# Patient Record
Sex: Male | Born: 1940
Health system: Southern US, Community
[De-identification: ages and names within clinical notes are randomized; demographics above are authoritative.]

## PROBLEM LIST (undated history)

## (undated) DIAGNOSIS — I4891 Unspecified atrial fibrillation: Secondary | ICD-10-CM

## (undated) DIAGNOSIS — C801 Malignant (primary) neoplasm, unspecified: Secondary | ICD-10-CM

## (undated) DIAGNOSIS — M51369 Other intervertebral disc degeneration, lumbar region without mention of lumbar back pain or lower extremity pain: Secondary | ICD-10-CM

## (undated) DIAGNOSIS — I10 Essential (primary) hypertension: Secondary | ICD-10-CM

## (undated) DIAGNOSIS — E119 Type 2 diabetes mellitus without complications: Secondary | ICD-10-CM

## (undated) DIAGNOSIS — I739 Peripheral vascular disease, unspecified: Secondary | ICD-10-CM

## (undated) DIAGNOSIS — D631 Anemia in chronic kidney disease: Secondary | ICD-10-CM

## (undated) DIAGNOSIS — I6529 Occlusion and stenosis of unspecified carotid artery: Secondary | ICD-10-CM

## (undated) DIAGNOSIS — I2510 Atherosclerotic heart disease of native coronary artery without angina pectoris: Secondary | ICD-10-CM

## (undated) DIAGNOSIS — E785 Hyperlipidemia, unspecified: Secondary | ICD-10-CM

## (undated) DIAGNOSIS — K900 Celiac disease: Secondary | ICD-10-CM

## (undated) DIAGNOSIS — D509 Iron deficiency anemia, unspecified: Secondary | ICD-10-CM

## (undated) DIAGNOSIS — Z8673 Personal history of transient ischemic attack (TIA), and cerebral infarction without residual deficits: Secondary | ICD-10-CM

## (undated) DIAGNOSIS — N183 Chronic kidney disease, stage 3 (moderate): Secondary | ICD-10-CM

## (undated) DIAGNOSIS — M5136 Other intervertebral disc degeneration, lumbar region: Secondary | ICD-10-CM

## (undated) DIAGNOSIS — E538 Deficiency of other specified B group vitamins: Principal | ICD-10-CM

## (undated) HISTORY — DX: Other intervertebral disc degeneration, lumbar region without mention of lumbar back pain or lower extremity pain: M51.369

## (undated) HISTORY — DX: Celiac disease: K90.0

## (undated) HISTORY — DX: Anemia in chronic kidney disease: D63.1

## (undated) HISTORY — PX: LUMBAR DISC SURGERY: SHX700

## (undated) HISTORY — PX: COLON SURGERY: SHX602

## (undated) HISTORY — DX: Unspecified atrial fibrillation: I48.91

## (undated) HISTORY — DX: Iron deficiency anemia, unspecified: D50.9

## (undated) HISTORY — DX: Personal history of transient ischemic attack (TIA), and cerebral infarction without residual deficits: Z86.73

## (undated) HISTORY — DX: Type 2 diabetes mellitus without complications: E11.9

## (undated) HISTORY — DX: Other intervertebral disc degeneration, lumbar region: M51.36

## (undated) HISTORY — DX: Atherosclerotic heart disease of native coronary artery without angina pectoris: I25.10

## (undated) HISTORY — DX: Peripheral vascular disease, unspecified: I73.9

## (undated) HISTORY — DX: Hyperlipidemia, unspecified: E78.5

## (undated) HISTORY — DX: Occlusion and stenosis of unspecified carotid artery: I65.29

## (undated) HISTORY — DX: Deficiency of other specified B group vitamins: E53.8

## (undated) HISTORY — DX: Chronic kidney disease, stage 3 (moderate): N18.3

---

## 1968-09-21 HISTORY — PX: APPENDECTOMY: SHX54

## 1999-05-23 ENCOUNTER — Inpatient Hospital Stay (HOSPITAL_COMMUNITY): Admission: RE | Admit: 1999-05-23 | Discharge: 1999-05-25 | Payer: Self-pay | Admitting: Neurosurgery

## 1999-05-23 ENCOUNTER — Encounter: Payer: Self-pay | Admitting: Neurosurgery

## 1999-05-24 ENCOUNTER — Encounter: Payer: Self-pay | Admitting: Neurosurgery

## 2000-05-07 ENCOUNTER — Encounter: Payer: Self-pay | Admitting: Neurosurgery

## 2000-05-07 ENCOUNTER — Encounter: Admission: RE | Admit: 2000-05-07 | Discharge: 2000-05-07 | Payer: Self-pay | Admitting: Neurosurgery

## 2000-05-27 ENCOUNTER — Encounter: Payer: Self-pay | Admitting: Neurology

## 2000-05-27 ENCOUNTER — Inpatient Hospital Stay (HOSPITAL_COMMUNITY): Admission: EM | Admit: 2000-05-27 | Discharge: 2000-06-02 | Payer: Self-pay | Admitting: Emergency Medicine

## 2000-05-27 ENCOUNTER — Encounter: Payer: Self-pay | Admitting: *Deleted

## 2000-05-27 ENCOUNTER — Encounter: Payer: Self-pay | Admitting: Emergency Medicine

## 2000-06-07 ENCOUNTER — Encounter: Admission: RE | Admit: 2000-06-07 | Discharge: 2000-06-21 | Payer: Self-pay | Admitting: Neurology

## 2000-06-14 ENCOUNTER — Encounter: Payer: Self-pay | Admitting: Vascular Surgery

## 2000-06-15 ENCOUNTER — Encounter (INDEPENDENT_AMBULATORY_CARE_PROVIDER_SITE_OTHER): Payer: Self-pay | Admitting: Specialist

## 2000-06-15 ENCOUNTER — Inpatient Hospital Stay: Admission: RE | Admit: 2000-06-15 | Discharge: 2000-06-16 | Payer: Self-pay | Admitting: Vascular Surgery

## 2000-06-15 HISTORY — PX: CAROTID ENDARTERECTOMY: SUR193

## 2000-07-16 ENCOUNTER — Encounter: Payer: Self-pay | Admitting: Neurosurgery

## 2000-07-16 ENCOUNTER — Ambulatory Visit (HOSPITAL_COMMUNITY): Admission: RE | Admit: 2000-07-16 | Discharge: 2000-07-17 | Payer: Self-pay | Admitting: Neurosurgery

## 2006-09-21 HISTORY — PX: CAROTID ENDARTERECTOMY: SUR193

## 2006-10-12 ENCOUNTER — Ambulatory Visit (HOSPITAL_COMMUNITY): Admission: RE | Admit: 2006-10-12 | Discharge: 2006-10-12 | Payer: Self-pay | Admitting: Ophthalmology

## 2006-11-18 ENCOUNTER — Ambulatory Visit (HOSPITAL_COMMUNITY): Admission: RE | Admit: 2006-11-18 | Discharge: 2006-11-18 | Payer: Self-pay | Admitting: Urology

## 2006-11-23 ENCOUNTER — Ambulatory Visit (HOSPITAL_COMMUNITY): Admission: RE | Admit: 2006-11-23 | Discharge: 2006-11-23 | Payer: Self-pay | Admitting: Urology

## 2006-12-01 ENCOUNTER — Ambulatory Visit (HOSPITAL_COMMUNITY): Admission: RE | Admit: 2006-12-01 | Discharge: 2006-12-01 | Payer: Self-pay | Admitting: Urology

## 2006-12-07 ENCOUNTER — Ambulatory Visit (HOSPITAL_COMMUNITY): Admission: RE | Admit: 2006-12-07 | Discharge: 2006-12-07 | Payer: Self-pay | Admitting: Urology

## 2007-01-07 ENCOUNTER — Ambulatory Visit (HOSPITAL_COMMUNITY): Admission: RE | Admit: 2007-01-07 | Discharge: 2007-01-07 | Payer: Self-pay | Admitting: Urology

## 2007-03-15 ENCOUNTER — Ambulatory Visit (HOSPITAL_COMMUNITY): Admission: RE | Admit: 2007-03-15 | Discharge: 2007-03-15 | Payer: Self-pay | Admitting: Urology

## 2007-07-12 ENCOUNTER — Ambulatory Visit: Payer: Self-pay | Admitting: Vascular Surgery

## 2007-07-15 ENCOUNTER — Ambulatory Visit: Payer: Self-pay | Admitting: Cardiology

## 2007-07-20 ENCOUNTER — Ambulatory Visit: Payer: Self-pay | Admitting: Vascular Surgery

## 2007-07-20 ENCOUNTER — Encounter: Payer: Self-pay | Admitting: Vascular Surgery

## 2007-07-20 ENCOUNTER — Inpatient Hospital Stay (HOSPITAL_COMMUNITY): Admission: RE | Admit: 2007-07-20 | Discharge: 2007-07-21 | Payer: Self-pay | Admitting: Vascular Surgery

## 2007-08-02 ENCOUNTER — Ambulatory Visit: Payer: Self-pay | Admitting: Vascular Surgery

## 2007-11-29 ENCOUNTER — Encounter: Payer: Self-pay | Admitting: Internal Medicine

## 2008-01-24 ENCOUNTER — Ambulatory Visit: Payer: Self-pay | Admitting: Vascular Surgery

## 2008-07-05 ENCOUNTER — Inpatient Hospital Stay (HOSPITAL_COMMUNITY): Admission: EM | Admit: 2008-07-05 | Discharge: 2008-07-09 | Payer: Self-pay | Admitting: Emergency Medicine

## 2008-07-05 ENCOUNTER — Ambulatory Visit: Payer: Self-pay | Admitting: Family Medicine

## 2008-07-05 ENCOUNTER — Ambulatory Visit: Payer: Self-pay | Admitting: Internal Medicine

## 2008-07-05 ENCOUNTER — Encounter: Payer: Self-pay | Admitting: Family Medicine

## 2008-07-06 ENCOUNTER — Encounter: Payer: Self-pay | Admitting: Family Medicine

## 2008-07-09 ENCOUNTER — Encounter: Payer: Self-pay | Admitting: Family Medicine

## 2008-07-09 ENCOUNTER — Ambulatory Visit: Payer: Self-pay | Admitting: Vascular Surgery

## 2008-08-13 ENCOUNTER — Ambulatory Visit: Payer: Self-pay | Admitting: Vascular Surgery

## 2009-02-12 ENCOUNTER — Ambulatory Visit: Payer: Self-pay | Admitting: Vascular Surgery

## 2009-06-17 ENCOUNTER — Ambulatory Visit (HOSPITAL_COMMUNITY): Admission: RE | Admit: 2009-06-17 | Discharge: 2009-06-17 | Payer: Self-pay | Admitting: Internal Medicine

## 2010-02-18 ENCOUNTER — Ambulatory Visit: Payer: Self-pay | Admitting: Vascular Surgery

## 2010-07-01 ENCOUNTER — Ambulatory Visit: Payer: Self-pay | Admitting: Vascular Surgery

## 2011-02-03 NOTE — H&P (Signed)
HISTORY AND PHYSICAL EXAMINATION   July 12, 2007   Re:  MADDOXX, BURKITT                DOB:  31-Jan-1941   CHIEF COMPLAINT:  Severe left internal carotid stenosis, asymptomatic.   HISTORY OF PRESENT ILLNESS:  This 70 year old male patient has a history  of vascular occlusive disease, having previously undergone a right  carotid endarterectomy by Dr. Hart Rochester in September, 2001 following a  right brain CVA, which consisted of some left facial and upper extremity  weakness and clumsiness, all of which eventually resolved.  He continues  to be asymptomatic at this point but has had moderate left carotid  occlusive disease, which has been followed on an annual basis and has  now progressed to an 80-85% stenosis in his left internal carotid artery  with no significant flow reduction in the right internal carotid at the  site of the previous endarterectomy.   He denies any hemiparesis, aphasia, amaurosis fugax, diplopia, blurred  vision or syncope.  He is scheduled for an elective carotid  endarterectomy.   PAST MEDICAL HISTORY:  1. Insulin-dependent diabetes mellitus.  2. Hyperlipidemia.  3. Right brain CVA in 2001.  4. Coronary artery disease, currently asymptomatic.  Previous PTCA and      stenting 15 years ago.  5. Negative for COPD or congestive heart failure.   PAST SURGICAL HISTORY:  1. Right carotid endarterectomy.  2. Appendectomy.  3. Lumbar disk surgery.   FAMILY HISTORY:  Positive for coronary artery disease in his mother and  brothers.  Positive for diabetes in his sister but negative for strokes.   SOCIAL HISTORY:  He is widowed, has two children.  Works as a Soil scientist.  He does not use tobacco and has not since 1965.  He does not use  alcohol.   REVIEW OF SYSTEMS:  He denies any weight loss, anorexia, fever, chest  pain but does have some mild dyspnea on exertion.  He has no orthopnea  or palpitations.  He denies any bronchitis, hemoptysis,  asthma,  wheezing, hematemesis, melena, hiatal hernia symptoms.  He has some  discomfort in both calves with walking about one block but no rest pain  or nonhealing ulcers.   ALLERGIES:  None known.   MEDICATIONS:  1. Lantus insulin 80 units daily.  2. Lipitor 20 mg 1 daily.  3. Tricor 145 mg 1 daily.  4. Altace 2.5 mg 1 daily.  5. Atenolol 50 mg 1 daily.  6. Glipizide/Metformin 5/500 2 in the morning, 1 at bedtime.  7. Cilasozol 1 daily.  8. Bayer aspirin 325 mg 1 daily.  9. A sleeping tablet at night.   PHYSICAL EXAMINATION:  Blood pressure 144/70, heart rate 80,  respirations 18.  Generally, he is a healthy-appearing male in no  apparent distress.  He is alert and oriented x3.  Neck is supple with 3+  carotid pulses palpable.  There is a harsh bruit on the left side.  Neurologic exam is normal.  No palpable adenopathy in the neck.  Upper  extremities, pulses are 3+ bilaterally.  Chest:  Clear to auscultation.  Cardiovascular: Regular rhythm with no murmurs.  Abdomen is soft and  nontender with no masses.  He has 3+ femoral and 2+ popliteal pulses  bilaterally with no distal pulses palpable.  Both feet are well  perfused.   Carotid duplex exam performed on October 21 reveals a 80-90% left  internal carotid stenosis and an approximate 30%  right internal carotid  stenosis.   IMPRESSION:  1. Severe left internal carotid stenosis, asymptomatic.  2. Coronary artery disease, currently asymptomatic, status post      percutaneous transluminal coronary angioplasty and stenting in the      past.  3. Type I diabetes mellitus.  4. Hyperlipidemia.  5. History of right brain stroke.   He will obtain a Cardiolite preoperatively and if negative, we will  proceed with an elective left carotid endarterectomy  on October 29th.  Risks and benefits have been thoroughly discussed in the office, and he  would like to proceed.   Quita Skye Hart Rochester, M.D.  Electronically Signed   JDL/MEDQ  D:   07/12/2007  T:  07/13/2007  Job:  [id number]   cc:   Dr. Corrinne Eagle. Ouida Sills, MD

## 2011-02-03 NOTE — Discharge Summary (Signed)
NAME:  FELIX, MERAS NO.:  [id number]   MEDICAL RECORD NO.:  [id number]          PATIENT TYPE:  INP   LOCATION:  3302                         FACILITY:  [facility]   PHYSICIAN:  Quita Skye. Hart Rochester, M.D.  DATE OF BIRTH:  Aug 16, 1941   DATE OF ADMISSION:  07/20/2007  DATE OF DISCHARGE:  07/21/2007                               DISCHARGE SUMMARY   ADMISSION DIAGNOSES:  1. Severe left internal carotid stenosis - asymptomatic.  2. Insulin-dependent diabetes mellitus.  3. Hyperlipidemia.  4. History of right brain cerebral vascular accident in 2001.  5. Coronary artery disease.   OPERATIVE PROCEDURES:  Performed during the hospitalization on October  29, right left carotid endarterectomy with Dacron patch angioplasty.   SURGEON:  Dr. Hart Rochester.   FIRST ASSISTANT:  Dr. Liliane Bade.   HISTORY OF PRESENT ILLNESS:  This 70 year old male had a history of  vascular disease and had previously undergone right carotid  endarterectomy by Dr. Hart Rochester in September 2001 after right brain stroke  which consisted of some left facial and upper extremity weakness and  clumsiness, all of which eventually resolved.  He has been followed for  moderate left internal carotid stenosis which has now progressed to  about 85% in severity and scheduled for a left carotid endarterectomy.  He denies any hemispheric or nonhemispheric symptoms at this time.   PAST MEDICAL HISTORY:  1. Type 1 diabetes mellitus.  2. Hyperlipidemia.  3. Right brain stroke in 2001.  4. Coronary artery disease.  5. Negative for COPD and congestive heart failure.   ADMISSION LABORATORY DATA:  Includes hematocrit of 33%, white count  6700.  Normal clotting studies.  Potassium 4.5, creatinine of 1.3.  Normal liver function studies.  Chest x-ray with no acute findings and  electrocardiogram unremarkable.   HOSPITAL COURSE:  The patient was admitted on October 29, taken to the  operating room for an elective left carotid  endarterectomy by Dr. Hart Rochester  which was uneventful.  The patient woke promptly in the operating room  following anesthesia with a normal neurologic exam.  Blood pressure  remained stable in the 130 to 160 range systolic.  He had excellent  urinary output and was able to swallow without difficulty, had no  hoarseness, and no neurologic deficits or complications.  He was  maintained in 3300 overnight, remained stable, and the following  morning, his lines were all discontinued.  He ambulated without  difficulty, tolerated his diet, voided and was discharged home.   DISCHARGE INSTRUCTIONS:  1. Cleanse the wound with soap and water.  2. No driving for 2 weeks.  3. Gradually resume normal activity and return to the office to see      Dr. Hart Rochester in 2 weeks.   DISCHARGE MEDICATIONS:  Include:  1. Tylox 1 p.o. q.6h. p.r.n. for pain  2. Metformin 1000 mg 2 times daily.  3. Pravastatin 40 mg 1 daily.  4. Lisinopril 20 mg 1 daily.  5. Atenolol 50 mg 1 daily.  6. Gemfibrozil 600 mg 2 times daily.  7. Aspirin 81 mg 1 daily.  8. Lantus  insulin 100 units subcutaneous daily and NovoLog plain 10      units subcutaneous q.a.m. and p.m.   CONDITION AT DISCHARGE:  Is improved.   FINAL DIAGNOSES:  1. Severe left internal carotid stenosis - asymptomatic.  2. Insulin-dependent diabetes mellitus.  3. Hyperlipidemia.  4. History of right brain cerebral vascular accident in 2001.  5. Coronary artery disease.   The patient will return to see Dr. Hart Rochester in the office in 2 weeks.      Quita Skye Hart Rochester, M.D.  Electronically Signed     JDL/MEDQ  D:  07/20/2007  T:  07/21/2007  Job:  [id number]

## 2011-02-03 NOTE — Procedures (Signed)
CAROTID DUPLEX EXAM   INDICATION:  Carotid artery disease.   HISTORY:  Diabetes:  Yes.  Cardiac:  PTCA.  Hypertension:  No.  Smoking:  No.  Previous Surgery:  Right carotid endarterectomy on 06/15/2000, left  carotid endarterectomy on 07/20/2007.  CV History:  Currently asymptomatic.  Amaurosis Fugax No, Paresthesias No, Hemiparesis No                                       RIGHT             LEFT  Brachial systolic pressure:         132               138  Brachial Doppler waveforms:         Normal            Normal  Vertebral direction of flow:        Antegrade         Antegrade  DUPLEX VELOCITIES (cm/sec)  CCA peak systolic                   86                115  ECA peak systolic                   349               141  ICA peak systolic                   108               78  ICA end diastolic                   26                23  PLAQUE MORPHOLOGY:                  Mixed  PLAQUE AMOUNT:                      Mild/moderate     None  PLAQUE LOCATION:                    ICA/ECA   IMPRESSION:  1. Patent right carotid endarterectomy site with a 1-39% stenosis of      the right internal carotid artery.  2. Patent left carotid endarterectomy site with no evidence of      stenosis in the left internal carotid artery.  3. No significant change noted when compared to the previous exam on      01/24/2008.   ___________________________________________  Quita Skye. Hart Rochester, M.D.   CH/MEDQ  D:  02/13/2009  T:  02/13/2009  Job:  [id number]

## 2011-02-03 NOTE — Procedures (Signed)
CAROTID DUPLEX EXAM   INDICATION:  Followup of known carotid artery disease.  Patient is  asymptomatic.   HISTORY:  Diabetes:  Yes.  Cardiac:  PTCA 11 years ago.  Hypertension:  No.  Smoking:  No.  Previous Surgery:  Right CEA with DPA on 06/15/2000 and left CEA with  DPA on 07/20/07, both by Dr. Hart [name].  CV History:  Amaurosis Fugax No, Paresthesias No, Hemiparesis No                                       RIGHT             LEFT  Brachial systolic pressure:         180               175  Brachial Doppler waveforms:         WNL               WNL  Vertebral direction of flow:        Antegrade         Antegrade  DUPLEX VELOCITIES (cm/sec)  CCA peak systolic                   69                74  ECA peak systolic                   248               60  ICA peak systolic                   111               76 (M)  ICA end diastolic                   23                29  PLAQUE MORPHOLOGY:                  Mixed             N/A  PLAQUE AMOUNT:                      Mild              N/A  PLAQUE LOCATION:                    Bifurcation, ICA, ECA               N/A   IMPRESSION:  1. Right 20-39% internal carotid artery stenosis (upper end of range),      status post carotid endarterectomy.  2. Left internal carotid artery without recurrent stenosis, status      post carotid endarterectomy.  3. Right external carotid artery stenosis.  4. Bilateral vertebral arteries with antegrade flow.   ___________________________________________  Stephen [name], M.D.   PB/MEDQ  D:  01/24/2008  T:  01/24/2008  Job:  [id number]

## 2011-02-03 NOTE — Procedures (Signed)
LOWER EXTREMITY ARTERIAL DUPLEX   INDICATION:  Bilateral claudication.   HISTORY:  Diabetes:  Yes.  Cardiac:  No.  Hypertension:  Yes.  Smoking:  No.  Previous Surgery:  No.   SINGLE LEVEL ARTERIAL EXAM                          RIGHT                LEFT  Brachial:               142                  151  Anterior tibial:        115                  107  Posterior tibial:       143                  104  Peroneal:  Ankle/Brachial Index:   0.95                 0.71   LOWER EXTREMITY ARTERIAL DUPLEX EXAM   DUPLEX:  Left superficial femoral artery distal stenosis at 291 m/s.  Mild-to-moderate plaquing noted throughout the left lower extremity  throughout the superficial femoral artery.  Luminal reduction of the  left superficial femoral artery distally, 16 cm.  Moderate  atherosclerosis throughout the left popliteal artery with monophasic  waveforms.   IMPRESSION:  Left distal superficial femoral artery stenosis at 291 m/s.  Monophasic waveforms throughout the left superficial femoral artery mid  and distally.  Moderate plaquing throughout the left lower extremity.  Difficulty visualizing the left profunda femoral artery proximally.   ___________________________________________  Quita Skye Hart Rochester, M.D.   OD/MEDQ  D:  07/01/2010  T:  07/01/2010  Job:  [id number]

## 2011-02-03 NOTE — Assessment & Plan Note (Signed)
OFFICE VISIT   LEDON, WEIHE  DOB:  Sep 20, 1941                                       01/24/2008  [id number]#:[id number]   The patient underwent left carotid endarterectomy for an asymptomatic  severe stenosis by me on 07/20/2007 and has done quite well.  He denies  any neurologic symptoms such as hemiparesis, aphasia, amaurosis fugax,  diplopia, blurred vision or syncope.  He has had no difficulty  swallowing and has had no hoarseness.  He denies any chest pain, dyspnea  on exertion, PND or orthopnea.  He does have stable bilateral calf  claudication at one block which is not limiting him severely with no  rest pain or history of infection or nonhealing ulcers.  He takes one  aspirin per day.   PHYSICAL EXAMINATION:  Vital signs:  On physical examination blood  pressure is 152/80, heart rate is 84, respirations 12.  General:  He is  alert and oriented x3.  Neck:  Carotid pulses are 3+ bilaterally.  No  bruits are audible.  Neurological:  Normal exam.  Chest:  Clear to  auscultation.  Cardiovascular:  Reveals a regular rhythm with no  murmurs.  Abdomen:  Soft, nontender with no masses.  He has 3+ femoral  and 2+ popliteal pulses bilaterally.   Carotid duplex exam reveals no evidence of flow reduction in the left  side where recent surgery was performed and his right carotid  endarterectomy site from 2001 is also widely patent.  He was reassured  regarding these findings.  We will see him in 6 months for the carotid  protocol followup unless he develops any symptoms in the interim.   Quita Skye Hart Rochester, M.D.  Electronically Signed   JDL/MEDQ  D:  01/24/2008  T:  01/25/2008  Job:  [id number]   cc:   Kingsley Callander. Ouida Sills, MD

## 2011-02-03 NOTE — Letter (Signed)
July 15, 2007    Stephen [name]. Hart Rochester, M.D.  [address]  [place], [place] [address]   RE:  NAVRAJ, DREIBELBIS  MRN:  [id number]  /  DOB:  12-10-1940   Dear JD:   Mr. Kainz was seen in the office today in consultation at your request  for coronary disease.  As you know, this nice gentleman underwent  balloon angioplasty of a stenosis of the left anterior descending  coronary artery in 1994.  He was noted to have total obstruction of the  RC at that time that was not approached percutaneously.  He is said to  have had a stress test that showed inferior wall ischemia consistent  with his known disease.  Nonetheless, he now indicates that he continues  to be asymptomatic with no chest discomfort and no dyspnea.   Mr. Fesperman also has cerebrovascular disease, having undergone right  carotid endarterectomy after a right cerebral CVA in 2001.  He now has  progressive and critical disease in the left, prompting plans for  elective repair.  He has multiple additional vascular risk factors  including diabetes, hypertension, and dyslipidemia.  He has only a  minimal and remote consumption of cigarettes.  He describes  claudication, Doppler studies of lower extremities are planned.   PAST MEDICAL HISTORY:  Otherwise notable for a remote appendectomy and a  herniated nucleus pulposis at L5 that required surgery on 2 occasions in  2000 and 2001.   SOCIAL HISTORY:  No excessive use of alcohol; widower with 2 children.  Previously worked as a Soil scientist.   FAMILY HISTORY:  Mother had coronary disease as well as a number of  brothers.  Sister has diabetes.  No family members with cerebrovascular  disease.   REVIEW OF SYSTEMS:  Entirely negative except as noted above.   CURRENT MEDICATIONS:  1. Metformin 1000 mg b.i.d.  2. Pravastatin 40 mg daily.  3. Lisinopril 20 mg daily.  4. Atenolol 50 mg daily.  5. Gemfibrozil 600 mg b.i.d.  6. Aspirin 81 mg daily.  7. Lantus insulin 100 units daily.  8.  NovoLog insulin 70/30 ten units b.i.d.   Recent laboratory values were sought.  We could locate only a hemoglobin  A1c level.   EXAMINATION:  Pleasant gentleman in no acute distress.  The weight is  225, blood pressure 140/80, heart rate 75 and regular, respirations 16.  HEENT:  EOMs full; pupils equal, round, reactive to light; normal oral  mucosa.  NECK:  Right carotid endarterectomy scar; faint bilateral bruits; no  jugular venous distention.  ENDOCRINE:  No thyromegaly.  HEMATOPOIETIC:  No adenopathy.  SKIN:  No significant lesions.  CHEST:  Normal AP diameter, clear lung fields.  CARDIAC:  Normal first and second heart sounds, modest early systolic  ejection murmur.  Normal PMI.  ABDOMEN:  Soft and nontender; no organomegaly; normal bowel sounds  without bruits; aortic pulsation not palpable.  EXTREMITIES:  With 1+ distal pulses; no edema.  NEUROMUSCULAR:  Symmetric strength and tone; normal cranial nerves.   EKG:  Normal sinus rhythm; within normal limits.   IMPRESSION:  Mr. Mondo has done outstandingly well with respect to  coronary disease, now 14 years following percutaneous intervention for  known 2 vessel disease.  There appears to be little reason to proceed  with preoperative stress testing.  He likely continues to have ischemia  in the distribution of the right coronary artery.  We could look for  disease in another distribution, but the  benefit of doing so is  speculative.  He is already treated with beta blocker and aspirin which  will continued through the operative interval.   Control of cardiac risk factors appears optimal or near optimal.  A  lipid profile will  be added to his preoperative lab studies.  Our  cardiology group will be available for assistance if needed while he is  hospital.  I will plan to see this nice gentleman again in 1 year.    Sincerely,      Gerrit Friends. Dietrich Pates, MD, [facility]  Electronically Signed    RMR/MedQ  DD: 07/15/2007   DT: 07/16/2007  Job #: [id number]   CC:    Kingsley Callander. Ouida Sills, MD

## 2011-02-03 NOTE — Discharge Summary (Signed)
NAME:  Stephen [name], Stephen [name] NO.:  [id number]   MEDICAL RECORD NO.:  [id number]          PATIENT TYPE:  INP   LOCATION:  3015                         FACILITY:  [facility]   PHYSICIAN:  Paula Compton, MD        DATE OF BIRTH:  06-Aug-1941   DATE OF ADMISSION:  07/05/2008  DATE OF DISCHARGE:  07/09/2008                               DISCHARGE SUMMARY   REASON FOR ADMISSION:  Slurred speech/dysphasia.   DISCHARGE DIAGNOSES:  1. Left thalamic cerebrovascular accident.  2. Diabetes mellitus type 2.  3. Hypertension.  4. Hyperlipidemia.   DISCHARGE MEDICATIONS:  1. Lantus 15 units subcu at bedtime.  2. NovoLog 10 units subcu t.i.d. with meals.  3. Metformin 1000 mg b.i.d.  4. TriCor 600 mg b.i.d.  5. Aspirin 81 mg p.o. daily.  6. Plavix 75 mg p.o. daily.  7. Atenolol 50 mg p.o. daily.  8. Pravastatin 40 mg p.o. daily.  9. Lisinopril 20 mg p.o. daily.   CONSULTS:  1. Neurology Stroke Team.  2. Cardiology,  [facility].   PROCEDURES:  1. July 05, 2008, two-view chest x-ray showed low volume exam with      atelectasis.  2. July 05, 2008, CT head without contrast showed old posterior      right frontal encephalomalacia and left thalamic and basal ganglia      lacunar infarcts. Negative for bleed.  3. July 05, 2008, MRI of the brain without contrast showed acute      infarct left thalamus extending towards the posterior limb of the      left internal capsule suggestion of tiny acute infarct posterior      right basal ganglia, posterior limb, right internal      capsule/thalamic junction.  4. July 06, 2008, 2-D echo.   IMPRESSION:  1. Significant nodular calcification of aortic valve some of which      appears to be mobile which may be source of the patient's embolus      cannot rule out small fibroelastoma consider transesophageal      echocardiography to further evaluate.  2. Carotid duplex study July 09, 2008 showed 40-59% stenosis right  proximal internal carotid artery with severe external carotid      artery stenosis noted.  For tibial artery flow antegrade      bilaterally.  No significant left internal carotid artery stenosis      noted.  3. July 09, 2008 transesophageal echocardiogram.  Impression;      aortic valve sclerosis but no fibroelastoma or papilloma, no      significant regurgitation or stenosis.  Normal mitral valve.  Mild      left ventricular hypertrophy with ejection fraction 65%.  Negative      bubble study.  No atrial septal defect or ventricular septal      defect.  No left atrial appendage clot.  No significant aortic      debris.  4. EKG July 05, 2008 regular rate and rhythm, mild ST depression 2,      3 and arteriovenous fistula without other concordant changes.  LABORATORY DATA:  Cardiac enzymes negative x3.  Cholesterol panel as  follows; total cholesterol 174, triglycerides 441, HDL 30, electrolytes  within normal limits except for glucose of 312.  On admission, white  blood cells 5.8, hemoglobin 12.1, hematocrit 35.2 on admission.  On  discharge, sodium 133, potassium 4.7, chloride 98, CO2 25, glucose 207,  BUN 36, creatinine 1.59.  White blood cells 7.9, hemoglobin 11.9,  hematocrit 33.9.  UA was notable for glucose of more than 500, protein  of 30 and small leukocyte esterase on admission.   HOSPITAL COURSE:  This is a 70 year old male with history of  hypertension, hyperlipidemia, type 2 diabetes who was admitted for  slurred speech and difficulty swallowing with elevated pressures to  232/119.   PROBLEMS:  1. Left thalamic CVA.  The patient initially presented with dysarthria      and difficulty swallowing was found to have continued dysarthria      per speech pathology during his hospital course.  The patient was      at baseline with regards to mobility. There was concern for      possible nidus for embolism with nodular mobile calcification of      aortic valve.  Cardiology  was consulted and TEE was performed with      results as above.  The patient was started on Plavix during      hospital course.  The patient with carotid Dopplers as above.  The      patient had also presented in hypertensive urgency.  Given this as      well as the patient's presenting symptoms, there was concern for      intracranial bleed.  CT was performed and there was no bleed noted      with results as above.  The patient was continued on his home doses      of lisinopril and atenolol during the course of his hospitalization      with better control of his pressures.  The patient's dysarthria and      dysphasia gradually improved over the course of the patient's      hospitalization.  The patient was also started on treatment dose      Lovenox during the course of his hospitalization and was discharged      on aspirin and Plavix.  2. Hypertension.  The patient initially presented with hypertensive      urgency with concern for emergency given his presentation of      slurring speech and dysarthria and dysphasia.  The patient was      continued on his home doses of lisinopril and atenolol.  Was put on      labetalol p.r.n.  Cardiac enzymes were within normal limits.  EKG      was without ST elevations or T-wave changes.  The patient's blood      pressure was better controlled over the course of his      hospitalization with systolics of 130-155 on discharge.  The      patient was discharged home on atenolol and lisinopril.  3. Hyperlipidemia.  The patient was started on TriCor during the      course of this hospitalization.  The patient was continued on his      home dose of pravastatin.  Fasting lipid panel was as above.  4. Diabetes mellitus type 2.  The patient was continued on NovoLog 10      units no coverage and Lantus  25 units at bedtime with fair control      of his blood glucose during the course of his hospitalization.  The      patient was started on 50 units Lantus at  bedtime on discharge with      instructions to follow up with his primary care physician regarding      this.  The patient had initially presented on Lantus 100 units at      bedtime.  The patient's metformin was held during the course of his      hospitalization; however, the patient was restarted on this on      discharge.  This medication was held for renal protection with      contrast studies.  5. Electrolyte abnormalities.  The patient had some mild electrolyte      abnormalities with sodium to a low of 132 and potassium to a high      of 4.7, BUN 236, and chloride to low of 98.  There was some concern      that this may have been related to his left thalamic CVA and the      patient's electrolytes were monitored during the course of his      hospitalization; however, there were no major deviations from the      norm prior to discharge.  This issue should also be followed up      with the patient's primary care physician.   FOLLOW UP:  The patient will follow up with the patient's primary care  physician, Dr. Carylon Perches in [place], phone number [phone] on July 12, 2008 at 4 p.m.Bobby Rumpf, MD  Electronically Signed      Paula Compton, MD  Electronically Signed    KC/MEDQ  D:  08/05/2008  T:  08/05/2008  Job:  [id number]   cc:   Kingsley Callander. Ouida Sills, MD

## 2011-02-03 NOTE — Procedures (Signed)
CAROTID DUPLEX EXAM   INDICATION:  Followup carotid artery disease.   HISTORY:  Diabetes:  Yes, on insulin  Cardiac:  PTCA 11 years ago  Hypertension:  No  Smoking:  No  Previous Surgery:  Right carotid endarterectomy with DPA on June 15, 2000, by Dr. Hart Rochester  CV History:  Amaurosis Fugax No.  Paresthesias No.  Hemiparesis No.                                       RIGHT             LEFT  Brachial systolic pressure:         130               134  Brachial Doppler waveforms:         Biphasic          Biphasic  Vertebral direction of flow:        Antegrade         Antegrade  DUPLEX VELOCITIES (cm/sec)  CCA peak systolic                   77                110  ECA peak systolic                   253               105  ICA peak systolic                   120               314  ICA end diastolic                   25                97  PLAQUE MORPHOLOGY:                  Mixed             Calcified  PLAQUE AMOUNT:                      Moderate          Large  PLAQUE LOCATION:                    ECA, ICA          ICA, ECA   IMPRESSION:  Bilateral ECA stenoses right greater than left.  20-39% right ICA stenosis status post endarterectomy.  60-79% left ICA stenosis (velocities have increased slightly from  previous exam).   ___________________________________________  Quita Skye Hart Rochester, M.D.   DP/MEDQ  D:  07/12/2007  T:  07/13/2007  Job:  [id number]

## 2011-02-03 NOTE — Procedures (Signed)
CAROTID DUPLEX EXAM   INDICATION:  Carotid artery disease.   HISTORY:  Diabetes:  Yes  Cardiac:  PTCA  Hypertension:  Yes  Smoking:  No  Previous Surgery:  Right carotid endarterectomy on 06/15/2000, left  carotid endarterectomy on 07/20/2007  CV History:  Asymptomatic  Amaurosis Fugax No, Paresthesias No, Hemiparesis No                                       RIGHT             LEFT  Brachial systolic pressure:         120               124  Brachial Doppler waveforms:         Triphasic         Triphasic  Vertebral direction of flow:        Ante              Triphasic Ante  DUPLEX VELOCITIES (cm/sec)  CCA peak systolic                   64                101  ECA peak systolic                   112               60  ICA peak systolic                   92                93  ICA end diastolic                   25                12  PLAQUE MORPHOLOGY:                  Mixed  PLAQUE AMOUNT:                      Mild              None  PLAQUE LOCATION:                    ICA, ECA          None   IMPRESSION:  1. Patent right carotid endarterectomy site with 1% to 39% stenosis of      the right internal carotid artery.  2. Patent left carotid endarterectomy site with 1% to 39% stenosis in      the left internal carotid artery.  3. Unable to visualize the right external carotid artery very well.  4. Bilateral antegrade flow in vertebral arteries.  5. No significant changes when compared to previous exam on      02/12/2009.        ___________________________________________  Quita Skye. Hart Rochester, M.D.   NT/MEDQ  D:  02/18/2010  T:  02/18/2010  Job:  [id number]

## 2011-02-03 NOTE — Letter (Signed)
July 15, 2007     RE:  TRIMAINE, MASER  MRN:  [id number]  /  DOB:  11-17-1940    Sincerely,      Gerrit Friends. Dietrich Pates, MD, [facility]  Electronically Signed    RMR/MedQ  DD: 07/15/2007  DT: 07/16/2007  Job #: [id number]

## 2011-02-03 NOTE — H&P (Signed)
NAME:  Stephen [name], Stephen [name] NO.:  [id number]   MEDICAL RECORD NO.:  [id number]          PATIENT TYPE:  INP   LOCATION:  3015                         FACILITY:  [facility]   PHYSICIAN:  Paula Compton, MD        DATE OF BIRTH:  [birth year]   DATE OF ADMISSION:  07/05/2008  DATE OF DISCHARGE:                              HISTORY & PHYSICAL   ADMITTING PHYSICIAN:  Paula Compton, MD   PRIMARY CARE PHYSICIAN:  Kingsley Callander. Ouida Sills, MD in [place].   CHIEF COMPLAINT:  Slurred speech.   HISTORY OF PRESENT ILLNESS:  The patient is a 70 year old male with a  history of CVA in 2001 who presents with speech and swallowing  difficulty since yesterday afternoon.  Per daughter, it sounded like he  was talking with his mouth full.  On his right side of his face in about  the V2 and V3 areas, he felt numb prior to this.  He seemed to worse  today with both swallowing and speech problems.  He has had no vision  changes or focal weakness or numbness now.  He loses his balance when  standing up, though not to any particular direction.  He also had  difficulty signing checks yesterday, and of note, the patient is right-  hand dominant.   PAST MEDICAL HISTORY:  1. Diabetes mellitus type 2.  2. Hyperlipidemia.  3. Hypertension.  4. CVA in 2001, which caused right hand weakness and he improved      status post rehab for 6 months.  5. Coronary artery disease status post carotid endarterectomies on the      left 2008 and on the right in 2001.  His most recent ultrasound in      May of this year showed about 20% blockage in the right and none on      the left.   ALLERGIES:  No known drug allergies.   MEDICATIONS:  1. Metformin 1 gram b.i.d.  2. Pravastatin 40 mg daily.  3. Lisinopril 20 mg daily.  4. Atenolol 50 mg daily.  5. Gemfibrozil 600 mg b.i.d.  6. Aspirin 81 mg daily.  7. Lantus 100 units daily.  8. NovoLog 10-15 units with each meal.   SOCIAL HISTORY:  He lives with his son, and he is  semi-retired but still  works in [employer].  He is a widower.  He quit smoking about  30 years ago and denies alcohol or any drug use.   FAMILY HISTORY:  Noncontributory.   REVIEW OF SYSTEMS:  See HPI, otherwise positive for dysarthria.  Negative for fevers, palpitations, chest pain, cough, dyspnea, visual  changes, focal weakness, or numbness now.   PHYSICAL EXAMINATION:  VITAL SIGNS:  Temperature 98.3, pulse 95,  respirations 26, blood pressure 232/119 down to 150s/60s with labetalol  but now back up to 201/86, and pulse oximetry 99% on 1 liter.  GENERAL:  In no acute distress, pleasant, alert, and oriented x3.  HEENT:  Atraumatic and normocephalic.  Extraocular movements intact.  Pupils equal, round, and reactive to light and accommodation.  Tympanic  membranes are glossy and clear.  There is some cerumen in the canals  bilaterally.  No nasal discharge.  Pharynx is normal.  Mucous membranes  are moist.  Uvula is in the midline, and pharyngeal arches are  symmetric.  NECK:  No lymphadenopathy.  CARDIOVASCULAR:  Regular rate and rhythm.  No murmurs, rubs, or gallops  are heard.  LUNGS:  Clear to auscultation bilaterally.  No wheezes, rales, or  rhonchi.  ABDOMEN:  Soft, nontender, and nondistended.  No hepatosplenomegaly.  EXTREMITIES:  Warm and well perfused.  No edema.  NEUROLOGIC:  Hearing is poor bilaterally, and he has slight left-sided  facial droop but eyebrows are symmetric.  Cranial nerves II-XII are  otherwise normal.  He is alert and oriented x3.  Strength is 5/5 in all  extremities.  Muscle stretch reflexes are decreased but symmetric  bilaterally.  No deficits to light touch.  His balance is poor, but he  is not falling to a specific side.  He has positive Romberg.  No  dysmetria, disdiadochokinesia, and then heel-to-shin bilaterally.   LABORATORY STUDIES:  White blood cell count 5.8, hemoglobin 12.1,  hematocrit 35.2, platelets 183, MCV 92.6, and RDW  13.5.  Sodium 136,  potassium 5.1, chloride 102, bicarb 24, BUN 22, creatinine 1.33, and  glucose 312.  AST 21, ALT 15, INR 1.0, PT 13.6, and PTT 28.  Urinalysis,  glucose 500, small leukocyte esterase, nitrite negative, protein 30, and  rare bacteria.   CT of the head, old right frontal encephalomalacia, left thalamic and  basal ganglia lacunar infarcts that are also old.  There is no acute  abnormality.  Chest x-ray, low volumes and atelectasis.   EKG, regular rate and rhythm, mild ST depressions in II, III, and aVF,  otherwise, no other concordant changes.   ASSESSMENT AND PLAN:  A 70 year old male with:  1. Slurred speech/swallowing difficulties, concern for transient      ischemic attack given his past medical history of hypertension,      diabetes, hyperlipidemia, and having had previous stroke.  Also      given his symptoms have almost been occurring from 24 hours now,      could be possibly he has had a CVA.  Hypertensive emergency is also      possible given the degree of elevation of his blood pressure,      though no other signs of end-organ damage.  No evidence of seizure      disorder.  Check an MRI and MRA of the brain and carotids.  We will      modify his risk factors as appropriate and monitor him on      telemetry.  We will check a 2D echo and check neuro and have neuro      checks done every 4 hours.  Consider neuro consult if MRI is      positive, but neuro exam now is not indicative of any specific      arterial infarct so we will hold off on that at this point.  We      will get PT/OT consults and a bedside swallow eval and speech      therapy consult.  Continue aspirin.  I want his CBGs to be less      than 140 if possible.  We will control his blood pressure only to      less than 180/110, otherwise, we will allow him to autoregulate.  2. Hypertensive urgency  up to 232/119 in the emergency department.  We      will continue his home lisinopril and atenolol and  give him      labetalol as needed for blood pressure greater than 180/110.  Check      cardiac enzymes and an EKG in the morning.  3. Hyperlipidemia:  Discontinue gemfibrozil and start TriCor given      there is less rhabdomyolysis risk in combination with his      pravastatin.  We will check a fasting lipid panel.  4. Diabetes mellitus, type 2:  Continue his Lantus at 10 units with      meal coverage.  We will hold his metformin      given he is going to get contrast for the MRI, and his creatinine      is already 1.33.  We will continue a sliding scale, check his A1c,      and check CBGs q.a.c. and nightly with coverage.  5. Prophylaxis:  Heparin.      Norton Blizzard, M.D.  Electronically Signed      Paula Compton, MD  Electronically Signed    SH/MEDQ  D:  07/05/2008  T:  07/06/2008  Job:  [id number]

## 2011-02-03 NOTE — Consult Note (Signed)
NAME:  Stephen [name], Stephen [name] NO.:  [id number]   MEDICAL RECORD NO.:  [id number]          PATIENT TYPE:  INP   LOCATION:  3015                         FACILITY:  [facility]   PHYSICIAN:  Noralyn Pick. Eden Emms, MD, FACCDATE OF BIRTH:  Aug 20, 1941   DATE OF CONSULTATION:  DATE OF DISCHARGE:  07/09/2008                                 CONSULTATION   INDICATIONS:  CVA and question of fibroelastoma in the aortic valve   The patient was sedated with 75 mcg of fentanyl and 6 mg of Versed.   Using digital technique, an Omniplane probe was advanced in the distal  esophagus without incident.   Transgastric imaging revealed mild LVH with an EF of 65%.  There was no  regional wall motion abnormalities.  The mitral valve was structurally  normal.  The aortic valve was trileaflet.  There was aortic valve  sclerosis.  There was no evidence of fibroelastoma or papilloma.  There  was no evidence of insufficiency or significant stenosis.  The aortic  root was normal.  The left atrium and right-sided cardiac chambers were  normal.   Imaging of the aorta showed no significant debris.   Bubble study was negative for right-to-left shunt.  There was no left  atrial appendage clot.  No ASD.   IMPRESSION:  1. Aortic valve sclerosis, but no fibroelastoma or papilloma.  No      significant regurgitation or stenosis.  2. Normal mitral valve.  3. Mild left ventricular hypertrophy.  Ejection fraction 65%.  4. Negative bubble study.  5. No atrial septal defect or ventricular septal defect.  6. No left atrial appendage clot.  7. No significant aortic debris.   The patient tolerated the procedure well.      Noralyn Pick. Eden Emms, MD, [facility]  Electronically Signed     PCN/MEDQ  D:  07/09/2008  T:  07/10/2008  Job:  [id number]

## 2011-02-03 NOTE — Assessment & Plan Note (Signed)
OFFICE VISIT   LENNELL, SHANKS  DOB:  May 16, 1941                                       07/01/2010  FAOZH#:[id number]   Patient is a 70 year old male patient well-known to me from previous  carotid occlusive disease now being evaluated for claudication symptoms.  He states that both legs, right equal to left, become tight after  walking about a quarter of a mile.  He has no rest pain, nonhealing  ulcers, infection, cellulitis, or other limb-threatening problems.  He  has had a previous right brain stroke in 2001 which affected his  ambulation slightly.   CHRONIC MEDICAL PROBLEMS:  1. Diabetes mellitus type 1.  2. Hypertension.  3. Hyperlipidemia.  4. History of right brain stroke in 2001.  5. Negative for coronary artery disease or COPD.   SOCIAL HISTORY:  He is single, retired, has 2 children.  He does not use  tobacco or alcohol.  Has not used tobacco since 1974.   FAMILY HISTORY:  Strong and positive for coronary artery disease in his  mother and brother.  Positive for diabetes in his mother.  Negative for  stroke.   REVIEW OF SYSTEMS:  Negative for chest pain.  Does have dyspnea on  exertion.  Denies any other significant symptoms in review of systems.   PHYSICAL EXAMINATION:  Blood pressure 153/73, heart rate 80,  respirations 14.  General:  He is a well-developed, well-nourished male  in no apparent distress, alert and oriented x3.  HEENT:  Normal for age.  EOMs intact.  Lungs:  Clear to auscultation.  No wheezing.  Cardiovascular:  Regular rhythm.  No murmurs.  Carotid pulses are 3+.  There is a soft bruit on the right.  No bruit on the left.  Abdomen:  Soft, nontender with no masses.  Neurologic exam reveals some left  facial weakness, mild left upper extremity weakness.  Otherwise normal.  Skin:  Free of rashes.  Lower extremity exam reveals 3+ femoral pulses  bilaterally.  No popliteal or distal pulses palpable.   Today I ordered lower  extremity duplex scan on the left and ABIs.  The  left leg has an area of some stenosis in the mid to distal portion of  the SFA.  There are some atherosclerotic changes distally.  ABI on the  left is 0.71.  On the right is 0.95.   He feels that his symptoms at this point are tolerable, but if they  worsen, he will be in touch with [place] to consider angiography and possible  PTA and stenting of his left superficial femoral artery.  Otherwise he  will return in 1 year for follow-up study in the vascular lab.     Quita Skye Hart [place], M.D.  Electronically Signed   JDL/MEDQ  D:  07/01/2010  T:  07/02/2010  Job:  [id number]   cc:   Kingsley Callander. Ouida Sills, MD

## 2011-02-03 NOTE — Assessment & Plan Note (Signed)
OFFICE VISIT   Stephen [name], Stephen [name]  DOB:  28-Mar-1941                                       08/02/2007  UEAVW#:[id number]   Patient returns for initial followup, two weeks post left carotid  endarterectomy with Dacron patch angioplasty.  He had an asymptomatic  severe stenosis.  He has done well initially with no hemispheric TIAs,  amaurosis fugax, diplopia, blurred vision, or syncope.  He is swallowing  well and has no hoarseness.  He has resumed his aspirin.   PHYSICAL EXAMINATION:  Blood pressure 150/70, heart rate 84,  respirations 14.  His left neck incision is healing nicely.  There is no  significant edema or evidence of infection.  Neurologic exam is normal.  Carotid pulses are 3+ with no audible bruits.   He was reassured regarding his progress.  We will return in six months  with a follow-up carotid duplex exam unless he develops any neurologic  symptoms in the interim.   Quita Skye Hart [place], M.D.  Electronically Signed   JDL/MEDQ  D:  08/02/2007  T:  08/03/2007  Job:  [id number]   cc:   Angus G. Renard Matter, MD

## 2011-02-03 NOTE — Op Note (Signed)
NAME:  SIAH, STEELY NO.:  [id number]   MEDICAL RECORD NO.:  [id number]          PATIENT TYPE:  INP   LOCATION:  3302                         FACILITY:  [facility]   PHYSICIAN:  Quita Skye. Hart Rochester, M.D.  DATE OF BIRTH:  10/06/1940   DATE OF PROCEDURE:  07/20/2007  DATE OF DISCHARGE:                               OPERATIVE REPORT   PREOPERATIVE DIAGNOSIS:  Severe left internal carotid stenosis -  asymptomatic.   POSTOPERATIVE DIAGNOSIS:  Severe left internal carotid stenosis -  asymptomatic.   OPERATION:  Left carotid endarterectomy with Dacron patch angioplasty.   SURGEON:  Quita Skye. Hart Rochester, M.D.   FIRST ASSISTANT:  Dr. Liliane Bade   SECOND ASSISTANT:  Gae Bon, PA.   ANESTHESIA:  General endotracheal.   BRIEF HISTORY:  This 70 year old male patient has been followed with  moderate carotid occlusive disease and on repeat duplex scanning was  found to have an 80 to 90% left internal carotid stenosis which is  asymptomatic.  He was scheduled for an elective left carotid  endarterectomy.   PROCEDURE:  The patient was taken to the operating room, placed in  supine position, at which time satisfactory general endotracheal  anesthesia was administered.  The left neck was prepped with Betadine  scrub and solution, draped in routine sterile manner.  Incision was made  along the anterior border of the sternocleidomastoid muscle, carried  down through subcutaneous tissue and platysma using the Bovie.  Common  facial vein and external jugular veins ligated with 3-0 silk ties and  divided, exposing the common, internal and external carotid arteries.  Care was taken not to injure the vagus or hypoglossal nerves both which  were exposed.  The high nature of the lesion required mobilization of  the hypoglossal nerve for exposure.  A #10 shunt was prepared and the  patient was heparinized.  Carotid vessels were occluded with vascular  clamps, longitudinal opening made in  the common carotid with a 15 blade  extending up the internal carotid with Potts scissors to a point distal  to the disease.  The plaque was at least 90% stenotic in severity with  good backbleeding from above.  #10 shunt was inserted without difficulty  reestablishing flow in about 2 minutes.  A standard endarterectomy was  then performed using elevator and Potts scissors with eversion  endarterectomy of the external carotid.  The plaque feathered off the  distal internal carotid artery nicely not requiring any tacking sutures.  The lumen was thoroughly irrigated with heparin saline.  All loose  debris carefully removed.  Arteriotomy was closed with a patch using  continuous 6-0 Prolene.  Prior to completion of the closure, the shunt  was removed and following antegrade and retrograde flushing the closure  was completed with reestablishment of flow initially up the external and  up the internal branch.  Carotid was occluded for less than  two minutes for removal shunt.  Protamine was then given to reverse the  heparin. Following adequate hemostasis, the wound was irrigated with  saline, closed in layers with Vicryl in subcuticular fashion.  Sterile  dressing applied.  The patient taken to recovery in satisfactory  condition      Quita Skye. Hart Rochester, M.D.  Electronically Signed     JDL/MEDQ  D:  07/20/2007  T:  07/20/2007  Job:  [id number]

## 2011-02-06 NOTE — Op Note (Signed)
[facility]  Patient:    Stephen [name], Stephen [name]                         MRN: [id number] Proc. Date: 06/15/00 Attending:  Quita Skye. Hart Rochester, M.D. CC:         Candy Sledge, M.D.  Butch Penny, M.D.   Operative Report  PREOPERATIVE DIAGNOSIS:  Recent right brain cerebrovascular accident secondary to severe right internal carotid artery stenosis.  POSTOPERATIVE DIAGNOSIS:  Recent right brain cerebrovascular accident secondary to severe right internal carotid artery stenosis.  OPERATION:  Right carotid endarterectomy with Dacron patch angioplasty.  SURGEON:  Quita Skye. Hart Rochester, M.D.  FIRST ASSISTANT:  Sherrie George, P.A.  ANESTHESIA:  General endotracheal.  BRIEF HISTORY:  This patient suffered a right brain CVA about three weeks ago with resultant left facial weakness and some mild left upper extremity clumsiness.  This improved significantly, but did not completely resolve and he was found to have a severe right internal carotid artery stenosis and moderate to severe left internal carotid artery stenosis.  He was scheduled for a right carotid endarterectomy.  DESCRIPTION OF PROCEDURE:  The patient was taken to the operating room and placed in the supine position, at which time satisfactory general endotracheal anesthesia was administered.  The right neck was prepped with Betadine scrub and solution and draped in a routine sterile manner.  An incision was made along the anterior border of the sternocleidomastoid muscle and carried down through the subcutaneous tissues and platysma using the Bovie.  The common facial vein and external jugular veins were ligated with 3-0 silk ties and divided, exposing the common internal and external carotid arteries.  Care was taken not to injure the vagus or hypoglossal nerves, both of which were exposed.  There was high bifurcation of the carotid, necessitating mobilization of the hypoglossal nerve to get adequate  exposure with the plaque extending above the crossing of the hypoglossal nerve.  A #10 shunt was prepared and the patient was heparinized.  Carotid vessels were occluded with vascular clamps.  A longitudinal opening was made in the common carotid with a 15 blade and extended up the internal carotid with the Potts scissors to a point distal to the disease.  The plaque was almost totally occlusive in nature, but the distal vessel appeared normal.  A #10 shunt was inserted without difficulty, reestablishing flow in about two minutes.  A standard enterectomy was then performed using the elevator and Potts scissors with an eversion endarterectomy of the external carotid.  A plaque was feathered off the distal internal carotid artery nicely, not requiring tacking sutures.  The lumen was thoroughly irrigated with heparin and saline and all loose debris was carefully removed.  The arteriotomy was closed with a patch using continuous 6-0 Prolene.  Prior to the completion of the closure, the shunt was removed after about 45 minutes of shunt time.  Following antegrade and retrograde flushing, closure was completed with reestablishment of flow initially up the external and internal branch.  The carotid was occluded for less two minutes for removal of the shunt.  Protamine was then given to reverse the heparin.  Following adequate hemostasis, the wound was irrigated with saline and closed in layers with Vicryl in a subcuticular fashion.  A sterile dressing was applied.  The patient was taken to the recovery room in satisfactory condition. DD:  06/15/00 TD:  06/15/00 Job: [id number]

## 2011-02-06 NOTE — H&P (Signed)
[place]. [facility]  Patient:    Stephen [name], Stephen [name]                       MRN: [id number] Adm. Date:  [id number] Attending:  Josie Saunders                         History and Physical  CHIEF COMPLAINT: Herniated lumbar disk.  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 He also had significant spondylitic disease at the L5-S1 level.  He was admitted to the hospital on a same-day procedure basis and underwent lumbar microdiskectomy, this performed on May 23, 1999.  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  He was treated.  He did not have significant evidence of persistent weakness and he was treated with Percocet; however, he continued to complain of significant pain and consequently it was elected to re-image his back.  I saw him back in the office on May 05, 2000 and he was said he was working quite hard in [employer] and said it had been effecting his right leg.  He continued to have some weakness in the right EHL, but it was better than it was preoperatively.  He had nearly complete foot drop.  He was still having a fair amount of pain and was quite concerned about that.  His wife had developed a significant pulmonary condition and had been placed on home oxygen and they had been quite busy attending to her medical problem.  On repeat examination at that time he had right dorsiflexion and extensor hallucis longus strength which was improved from previous examinations but continued to have some mild weakness.  He had otherwise full strength in his lower extremities.  He had a repeat MRI which was reviewed with him in the office on May 12, 2000,  which shows a very large disk recurrence at the L4-5 level on the right causing near complete obliteration of spinal canal and marked right-sided L5 nerve root compression.  He has significant spondylitic disease at the L4-5 and L5-S1 levels with right-sided herniation at the L5-S1 level.  He mainly has symptoms consistent with an L5 radiculopathy with anterior shin pain.  Because of this I had recommended that he undergo redo diskectomy at the L4-5 level.  I do not feel that the L5-S1 level is a significant cause of his current pain complaints.  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  I suspect he had recurrent disk rupture at that point.  I recommended that he undergo redo diskectomy at the L4-5 level on the right rather than a fusion.  If he were to have a fusion operation he would need fusion at the L4-5 and L5-S1 levels.  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  We initially scheduled surgery for September 2001; however, the patient developed acute onset  of left-sided weakness and was found to have had a stroke.  He was admitted to the hospital and placed on heparin, and subsequently underwent urgent carotid endarterectomy on the right.  He improved significantly in terms of his stroke but continued to have significant right lower extremity pain.  We therefore elected to admit him to the hospital approximately one month following his carotid endarterectomy for redo microdiskectomy at the L4-5 level on the right.  He is aware of the potential risks and benefits of surgery and wishes to proceed. DD:  07/16/00 TD:  07/16/00 Job: [id number]

## 2011-02-06 NOTE — Op Note (Signed)
[place]. [facility]  Patient:    Stephen [name], Stephen [name]                       MRN: [id number] Proc. Date: 07/16/00 Adm. Date:  [id number] Attending:  Josie Saunders                           Operative Report  PREOPERATIVE DIAGNOSIS:  Recurrent disk herniation L4-5 right with degenerative disk disease, spondylosis and radiculopathy.  POSTOPERATIVE DIAGNOSIS:  Recurrent disk herniation L4-5 right with degenerative disk disease, spondylosis and radiculopathy.  PROCEDURE:  Redo semihemilaminectomy L4-5 right with microdiskectomy and microdissection.  SURGEON:  Danae Orleans. Venetia Maxon, M.D.  ASSISTANT:  Hewitt Shorts, M.D.  ANESTHESIA:  General endotracheal anesthesia.  ESTIMATED BLOOD LOSS:  Less than 50 cc.  COMPLICATIONS:  None.  DISPOSITION:  Recovery.  INDICATIONS:  [name] is a 70 year old man with a large recurrent disk herniation of the L4-5 level central and to the right with bilateral lower extremity pain.  It was elected to take him to surgery for microdiskectomy.  DESCRIPTION OF PROCEDURE:  Mr. Guedes was brought to the operating room. Following the satisfactory and noncomplicated induction of general endotracheal anesthesia and placement of intravenous lines, he was placed in the prone position on the operating table.  His low back was shaved, then prepped and draped in usual sterile fashion.  Area of plain incision was infiltrated with 0.25% Marcaine, 0.5% lidocaine and 1:200,000 epinephrine.  An incision was made overlying the previous incision, carried through subcutaneous tissues to the lumbodorsal fascia, which was then incised with electrocautery.  Subperiosteal dissection was performed on the right side exposing the L4-5 interspace.  Intraoperative x-ray confirmed correct orientation.  Using curets, the old scar tissue was dissected from bone.  The laminotomy defects were then extended both cephalad on the L4 lamina, as well as,  performing a medial facetectomy and also decompressing the L5 nerve root by removing the superior portion of the L5 lamina.  The microscope was then brought into the field and using microdissection technique, scar tissue was cleaned off of the thecal sac.  Lateral dissection was performed exposing herniated disk material, which was then removed in a piecemeal fashion. Initially, it was very difficult to mobilize the thecal sac and nerve roots, however, on removing multiple large fragments of herniated disk material, the sac was significantly decompressed.  DErrico nerve root retractor was placed and using microdissection technique, the interspace was cleared of residual disk material and the canal was decompressed of disk material as well.  The end plates were scraped of residual disk material.  Lateral recess was also decompressed.  Osteophytes were removed at L4 and L5 levels.  Hemostasis was obtained with bipolar electrocautery.  The wound was copiously irrigated with bacitracin saline.  The self-retaining retractor was removed, microscope was taken out of the field. The lumbodorsal fascia was then reapproximated with 0 Vicryl sutures.  Subcutaneous tissues were reapproximated with 2-0 Vicryl interrupted, inverted sutures.  Skin edges reapproximated with interrupted 3-0 Vicryl subcuticular stitch.  Wound was dressed with benzoin, Steri-Strips, Telfa gauze and tape.  Patient was extubated in the operating room and taken to recovery room in stable satisfactory condition, having tolerated his operation well.  Counts were correct at the end of the case. DD:  07/16/00 TD:  07/16/00 Job: [id number]

## 2011-02-06 NOTE — Consult Note (Signed)
[place]. [facility]  Patient:    Stephen [name], Stephen [name]                       MRN: [id number] Proc. Date: 05/28/00 Adm. Date:  [id number] Attending:  Enos Fling                          Consultation Report  CHIEF COMPLAINT:  Right brain CVA, secondary to right carotid stenosis.  HISTORY OF PRESENT ILLNESS:  I appreciate the opportunity to see this patient in consultation regarding his acute right brain CVA.  This 70 year old gentleman 48 hours prior to admission had two episodes of transient left facial weakness and numbness, and some clumsiness in the left hand, both of which resolved within 30 minutes.  He then awake on the day of admission with another episode of similar symptoms which did not resolve.  He was then taken to the emergency room and admitted for a right brain CVA.  A CT scan initially did not reveal any evidence of hemorrhage or acute infarction.  The patient denies any history of previous amaurosis fugax, CVA, or TIA.  He also denies any lower extremity claudication symptoms.  PAST MEDICAL HISTORY:  Negative for deep vein thrombosis, thrombophlebitis, pulmonary emboli, myocardial infarction, or recent angina pectoris.  He does have a history of a PTCA done six years ago.  He was seen by Dr. Luis Abed at that time.  He does not have active angina pectoris symptoms at this time.  He does have type 2 diabetes mellitus since 1984.  PAST SURGICAL HISTORY: 1. Appendectomy in the 1970s. 2. Lumbar disk repair in the past with scheduled redo surgery later this    month.  SOCIAL HISTORY:  Smoked three packs of cigarettes for 15 years, but has not smoked in 30 years.  No alcohol.  FAMILY HISTORY:  Positive for diabetes mellitus, brain tumor, and hypertension.  REVIEW OF SYSTEMS:  Is unremarkable.  ALLERGIES:  No known drug allergies.  CURRENT MEDICATIONS: 1. Glucotrol XL one p.o. q.a.m., one q.p.m. 2. Glucophage XL 500 mg three  p.o. q.a.m. 3. Atenolol 50 mg q.d. 4. Altace 2.5 mg q.d. 5. Celebrex 200 mg q.d.  PHYSICAL EXAMINATION:  VITAL SIGNS:  Blood pressure 143/80, heart rate 80, respirations 18, temperature 98 degrees.  GENERAL:  He is a well-developed, well-nourished white male, in no apparent distress.  NECK:  Supple with 3+ carotid pulses.  There is a bruit audible on the right side.  There is no palpable adenopathy in the neck.  EXTREMITIES:  Upper extremity pulses intact with well-perfused upper extremities.  NEUROLOGIC:  Reveals some clumsiness in the left hand, with no upper extremity weakness in the proximal muscles.  There is left facial asymmetry with mild weakness and numbness on the left side.  Speech is clear.  No lower extremity weakness is noted.  CHEST:  Clear to auscultation and auscultation.  ABDOMEN:  Soft and nontender, with no masses or organomegaly.  PULSES:  Femoral, popliteal, and dorsalis pedis pulses 3+ bilaterally, with good strength.  LABORATORY DATA:  Carotid duplex examination done on May 28, 2000, revealed a severe right internal carotid stenosis greater than 80%, and a 60%-80% left internal carotid stenosis with antegrade vertebral flow bilaterally.  A 2-D echocardiogram revealed no proximal source for emboli.  A CT scan as noted with no acute infarction or hemorrhage on May 27, 2000.  IMPRESSION:  1. Right brain cerebrovascular accident, versus resolving ischemic    neurological deficit, secondary to severe right internal carotid    stenosis. 2. History of coronary artery disease with previous percutaneous transluminal    coronary angioplasty six years ago, presently asymptomatic.  RECOMMENDATIONS:  The patient will need a right carotid endarterectomy in the near future.  Because of the acute infarction and some persistent deficits, would like to wait a few weeks before proceeding.  Would proceed with switching heparin to Coumadin and when  adequately anticoagulated, could be discharged and I will see back in the office in two weeks, and schedule surgery at that time. DD:  05/29/00 TD:  05/29/00 Job: [id number]

## 2011-02-06 NOTE — H&P (Signed)
NAME:  Stephen [name], Stephen [name] NO.:  [id number]   MEDICAL RECORD NO.:  [id number]          PATIENT TYPE:  AMB   LOCATION:  DAY                           FACILITY:  [facility]   PHYSICIAN:  Dennie Maizes, M.D.   DATE OF BIRTH:  October 09, 1940   DATE OF ADMISSION:  12/01/2006  DATE OF DISCHARGE:  LH                              HISTORY & PHYSICAL   CHIEF COMPLAINT:  Right flank pain, right distal ureteral calculus with  obstruction.   HISTORY OF PRESENT ILLNESS:  This 70 year old male has a past history of  urolithiasis.  He has undergone multiple lithotripsies as well as  ureteroscopies with stone extractions in the past.  The last was done in  2001 for right renal calculus.   He has been experiencing sudden onset of severe right flank pain  radiating to the front for about 2 weeks.  He denies having hematuria,  chills, or voiding difficulty.  He has urinary frequency x4 to 5 and  nocturia x2.  His x-ray revealed 1 cm right distal ureteral calculus at  the level of the pelvic rim.  Three stones are noted in the left kidney,  the combined size of which is about 1.4 cm.  The patient is unable to  pass the right ureteral stone.  He is brought to the shortstay center  today for extracorporeal shock wave lithotripsy of the right ureteral  calculus.   PAST MEDICAL HISTORY:  History of recurrent urolithiasis, status post  multiple ESL's, status post ureteroscopy with stone extractions, history  of insulin-dependent diabetes mellitus, coronary artery disease, status  post coronary angioplasty in 1994, status post right carotid  endarterectomy, status post lumbar laminectomy and diskectomy x2,  history of cataract surgery.   MEDICATIONS:  1. Metformin 1000 mg two p.o. daily.  2. Atenolol 50 mg one p.o. daily.  3. Tricor 140 mg one p.o. daily.  4. Lisinopril 20 mg one p.o. daily.  5. Pravastatin 40 mg one p.o. daily.  6. Bayer aspirin 81 mg one p.o. daily which had been discontinued  for      the lithotripsy.  7. Lantus Insulin 100 units q.h.s.  8. NovoLog 20 units p.o. daily.  9. Eye drops.   ALLERGIES:  No known drug allergies.   PHYSICAL EXAMINATION:  HEENT:  Normal.  LUNGS:  Clear to auscultation.  HEART:  Regular rate and rhythm with no murmurs.  ABDOMEN:  Soft.  No palpable flank mass.  No costovertebral angle  tenderness.  Bladder is not palpable.  Kidneys and testes are normal.  RECTAL:  34 grams benign prostate.   IMPRESSION:  Right distal ureteral calculus with obstruction, right  renal colic, left renal calculi (nonobstructing).   PLAN:  Extracorporeal shock wave lithotripsy of the right ureteral  calculus with IV sedation in the hospital.  I have discussed with the  patient regarding the diagnosis, operative details, alternative  treatments, outcome, possible risks and complications and he has agreed  for the procedure to be done.  Nonfragmentation of the stone as well as  additional procedures were explained to the patient.  Dennie Maizes, M.D.  Electronically Signed     SK/MEDQ  D:  12/01/2006  T:  12/01/2006  Job:  [id number]   cc:   Kingsley Callander. Ouida Sills, MD  Fax: [phone]

## 2011-02-19 ENCOUNTER — Other Ambulatory Visit (INDEPENDENT_AMBULATORY_CARE_PROVIDER_SITE_OTHER): Payer: Medicare Other

## 2011-02-19 DIAGNOSIS — Z48812 Encounter for surgical aftercare following surgery on the circulatory system: Secondary | ICD-10-CM

## 2011-02-19 DIAGNOSIS — I6529 Occlusion and stenosis of unspecified carotid artery: Secondary | ICD-10-CM

## 2011-02-25 NOTE — Procedures (Unsigned)
CAROTID DUPLEX EXAM  INDICATION:  Bilateral carotid endarterectomies.  HISTORY: Diabetes:  Yes. Cardiac:  PTCA. Hypertension:  Yes. Smoking:  No. Previous Surgery:  Right carotid endarterectomy on 06/15/2000, left carotid endarterectomy on 07/20/2007. CV History:  Currently asymptomatic. Amaurosis Fugax No, Paresthesias No, Hemiparesis No                                      RIGHT             LEFT Brachial systolic pressure:         138               142 Brachial Doppler waveforms:         Normal            Normal Vertebral direction of flow:        Antegrade         Antegrade DUPLEX VELOCITIES (cm/sec) CCA peak systolic                   81                107 ECA peak systolic                   265               122 ICA peak systolic                   93                53 ICA end diastolic                   30                18 PLAQUE MORPHOLOGY:                  Mixed PLAQUE AMOUNT:                      Mild              None PLAQUE LOCATION:                    ICA/ECA  IMPRESSION: 1. Patent bilateral carotid endarterectomy sites with no     hemodynamically significant stenosis of the bilateral internal     carotid arteries. 2. Right external carotid artery stenosis noted. 3. No significant change noted when compared to the previous exam on     02/18/2010.  ___________________________________________ Quita Skye. Hart Rochester, M.D.  CH/MEDQ  D:  02/20/2011  T:  02/20/2011  Job:  [id number]

## 2011-05-22 ENCOUNTER — Encounter: Payer: Self-pay | Admitting: Vascular Surgery

## 2011-06-22 LAB — BASIC METABOLIC PANEL
BUN: 20
CO2: 26
CO2: 26
Calcium: 9.4
Chloride: 98
Chloride: 99
Creatinine, Ser: 1.33
GFR calc Af Amer: 53 — ABNORMAL LOW
GFR calc non Af Amer: 44 — ABNORMAL LOW
GFR calc non Af Amer: 44 — ABNORMAL LOW
Glucose, Bld: 154 — ABNORMAL HIGH
Glucose, Bld: 194 — ABNORMAL HIGH
Glucose, Bld: 221 — ABNORMAL HIGH
Potassium: 4.6
Potassium: 4.7
Potassium: 5.2 — ABNORMAL HIGH
Sodium: 130 — ABNORMAL LOW
Sodium: 132 — ABNORMAL LOW
Sodium: 138

## 2011-06-22 LAB — HEMOGLOBIN A1C
Hgb A1c MFr Bld: 7.2 — ABNORMAL HIGH
Mean Plasma Glucose: 160

## 2011-06-22 LAB — URINALYSIS, ROUTINE W REFLEX MICROSCOPIC
Bilirubin Urine: NEGATIVE
Glucose, UA: 500 — AB
Nitrite: NEGATIVE
Protein, ur: 30 — AB
Urobilinogen, UA: 0.2
pH: 7

## 2011-06-22 LAB — LIPID PANEL
LDL Cholesterol: UNDETERMINED
Total CHOL/HDL Ratio: 5.8
VLDL: UNDETERMINED

## 2011-06-22 LAB — URINE MICROSCOPIC-ADD ON

## 2011-06-22 LAB — CARDIAC PANEL(CRET KIN+CKTOT+MB+TROPI)
CK, MB: 2.2
Relative Index: INVALID
Total CK: 61
Total CK: 69

## 2011-06-22 LAB — DIFFERENTIAL
Basophils Absolute: 0
Eosinophils Relative: 7 — ABNORMAL HIGH
Lymphs Abs: 1.7
Neutro Abs: 3.2

## 2011-06-22 LAB — COMPREHENSIVE METABOLIC PANEL
Alkaline Phosphatase: 75
CO2: 24
Creatinine, Ser: 1.33
GFR calc Af Amer: >60
GFR calc non Af Amer: 54 — ABNORMAL LOW
Glucose, Bld: 312 — ABNORMAL HIGH
Potassium: 5.1
Total Bilirubin: 0.5

## 2011-06-22 LAB — CBC
HCT: 33.9 — ABNORMAL LOW
HCT: 35.2 — ABNORMAL LOW
Hemoglobin: 11.3 — ABNORMAL LOW
Hemoglobin: 11.9 — ABNORMAL LOW
Hemoglobin: 12.1 — ABNORMAL LOW
MCHC: 34.3
MCHC: 34.5
Platelets: 183
RBC: 3.54 — ABNORMAL LOW
RBC: 3.66 — ABNORMAL LOW
RDW: 13.5
WBC: 7.9

## 2011-06-22 LAB — GLUCOSE, CAPILLARY
Glucose-Capillary: 170 — ABNORMAL HIGH
Glucose-Capillary: 171 — ABNORMAL HIGH
Glucose-Capillary: 224 — ABNORMAL HIGH
Glucose-Capillary: 243 — ABNORMAL HIGH
Glucose-Capillary: 252 — ABNORMAL HIGH
Glucose-Capillary: 260 — ABNORMAL HIGH

## 2011-06-22 LAB — PROTIME-INR
INR: 1
Prothrombin Time: 13.6

## 2011-06-29 ENCOUNTER — Encounter: Payer: Self-pay | Admitting: Vascular Surgery

## 2011-06-30 ENCOUNTER — Encounter (INDEPENDENT_AMBULATORY_CARE_PROVIDER_SITE_OTHER): Payer: Medicare Other | Admitting: *Deleted

## 2011-06-30 ENCOUNTER — Encounter: Payer: Self-pay | Admitting: Vascular Surgery

## 2011-06-30 ENCOUNTER — Ambulatory Visit (INDEPENDENT_AMBULATORY_CARE_PROVIDER_SITE_OTHER): Payer: Medicare Other | Admitting: Vascular Surgery

## 2011-06-30 VITALS — BP 122/68 | HR 79 | Resp 16 | Ht 73.0 in | Wt 230.0 lb

## 2011-06-30 DIAGNOSIS — I739 Peripheral vascular disease, unspecified: Secondary | ICD-10-CM

## 2011-06-30 DIAGNOSIS — I70219 Atherosclerosis of native arteries of extremities with intermittent claudication, unspecified extremity: Secondary | ICD-10-CM

## 2011-06-30 DIAGNOSIS — I6529 Occlusion and stenosis of unspecified carotid artery: Secondary | ICD-10-CM

## 2011-06-30 NOTE — Procedures (Unsigned)
LOWER EXTREMITY ARTERIAL DUPLEX  INDICATION:  Follow up peripheral vascular disease.  HISTORY: Diabetes:  Yes. Cardiac:  PTCA. Hypertension:  Yes. Smoking:  No. Previous Surgery:  No.  SINGLE LEVEL ARTERIAL EXAM                         RIGHT                LEFT Brachial:               150                  144 Anterior tibial:        88                   89 Posterior tibial:       90                   80 Peroneal: Ankle/Brachial Index:   0.60                 0.59  LOWER EXTREMITY ARTERIAL DUPLEX EXAM  DUPLEX:  Patent bilateral lower extremity arterial systems with velocity measurements noted on the following worksheet.  IMPRESSION: 1. Bilateral ankle brachial indices are suggestive of moderate     arterial disease and have declined since the previous examination. 2. Patent right lower extremity arterial system. 3. Patent left lower extremity arterial system with an elevated     velocity of 208 cm/s noted in the mid superficial femoral artery.     Please see the following worksheet for all velocity measurements.      ___________________________________________ Quita Skye. Hart Rochester, M.D.  EM/MEDQ  D:  06/30/2011  T:  06/30/2011  Job:  [id number]

## 2011-06-30 NOTE — Progress Notes (Signed)
Subjective:     Patient ID: Stephen [name], male   DOB: 12/09/1940, 70 y.o.   MRN: [id number]  HPI this 70 year-old male patient returns today for further followup regarding his lower extremity claudication symptoms. The symptoms have slightly worsened since 1 year ago. He is unable to walk one block without stopping for rest. Left calf is slightly worse than the right calf he has no symptoms in the thigh or buttock area. Will occasionally have tingling in the feet at rest. He has no history of gangrene, infection, nonhealing ulcers, or rest pain. He also denies neurologic symptoms such as unilateral weakness, aphasia, syncope, or amaurosis fugax.  Past Medical History  Diagnosis Date  . Diabetes mellitus   . Hypertension   . Hyperlipidemia   . Carotid artery occlusion   . CVA (cerebral vascular accident)   . Leg pain   . CAD (coronary artery disease)     History  Substance Use Topics  . Smoking status: Former Smoker    Types: Cigarettes    Quit date: 09/21/1972  . Smokeless tobacco: Not on file  . Alcohol Use: No    Family History  Problem Relation Age of Onset  . Heart disease Mother   . Diabetes Mother   . Diabetes Sister   . Heart disease Brother     No Known Allergies  Current outpatient prescriptions:atenolol (TENORMIN) 50 MG tablet, Take 50 mg by mouth daily.  , Disp: , Rfl: ;  clopidogrel (PLAVIX) 75 MG tablet, daily. , Disp: , Rfl: ;  fenofibrate (TRICOR) 145 MG tablet, Take 145 mg by mouth daily.  , Disp: , Rfl: ;  insulin aspart (NOVOLOG) 100 UNIT/ML injection, Inject into the skin daily.  , Disp: , Rfl:  insulin glargine (LANTUS) 100 UNIT/ML injection, Inject 50 Units into the skin at bedtime.  , Disp: , Rfl: ;  lisinopril (PRINIVIL,ZESTRIL) 20 MG tablet, Take 20 mg by mouth daily.  , Disp: , Rfl: ;  METFORMIN HCL PO, Take 100 mg by mouth 2 (two) times daily.  , Disp: , Rfl: ;  pravastatin (PRAVACHOL) 10 MG tablet, Take 10 mg by mouth daily.  , Disp: , Rfl:   BP  122/68  Pulse 79  Resp 16  Ht 6\' 1"  (1.854 m)  Wt 230 lb (104.327 kg)  BMI 30.34 kg/m2  Body mass index is 30.34 kg/(m^2).        Review of Systems he denies chest pain, dyspnea on exertion, orthopnea, chronic bronchitis, arthritis, joint pain, all other symptoms. Complete review of systems is negative     Objective:   Physical Exam blood pressure 120/68 heart rate 79 respirations 14 General he is well-developed well-nourished male no apparent stress alert and oriented x3 Lungs no rhonchi or wheezing Cardiovascular regular rhythm no murmurs carotid pulses 3+ soft bruits bilaterally HEENT normal for age Abdomen soft nontender with no masses 3+ femoral pulses bilaterally no popliteal or distal pulses palpable both feet are well perfused. No evidence of ischemia. No infection ulceration or gangrene. Skin free of rashes Neurologic normal  Today I ordered lower extremity arterial Dopplers. ABI on the right has diminished from 0.95-0.60 in the past 12 months. Left leg is 0.59 similar to last year. He does have evidence of stenosis in the left superficial femoral artery on duplex scanning.    Assessment:     Stable claudication symptoms. Slight decrease in ABI since last year. Patient is comfortable with symptoms and will continue to increase  ambulation as tolerated. Carotid disease is stable.    Plan:    return in one year with ABIs and duplex scan of both lower extremities and less symptoms worsen. Will have carotid duplex exam in 6 months.

## 2011-07-01 LAB — COMPREHENSIVE METABOLIC PANEL
AST: 15
Albumin: 3.8
Chloride: 105
Creatinine, Ser: 1.33
GFR calc Af Amer: >60
Total Bilirubin: 0.6
Total Protein: 7.5

## 2011-07-01 LAB — CBC
Hemoglobin: 10.1 — ABNORMAL LOW
MCHC: 35
MCV: 90.9
MCV: 91.1
Platelets: 221
RBC: 3.17 — ABNORMAL LOW
RDW: 13.7
RDW: 13.8
WBC: 6.7

## 2011-07-01 LAB — URINALYSIS, ROUTINE W REFLEX MICROSCOPIC
Bilirubin Urine: NEGATIVE
Glucose, UA: NEGATIVE
Hgb urine dipstick: NEGATIVE
Ketones, ur: NEGATIVE
Protein, ur: NEGATIVE
pH: 6

## 2011-07-01 LAB — BASIC METABOLIC PANEL
CO2: 24
Calcium: 8.9
Chloride: 104
Creatinine, Ser: 1.36
GFR calc Af Amer: >60
Glucose, Bld: 129 — ABNORMAL HIGH

## 2011-07-01 LAB — URINE MICROSCOPIC-ADD ON

## 2011-07-01 LAB — CROSSMATCH
ABO/RH(D): O POS
Antibody Screen: NEGATIVE

## 2011-07-01 LAB — ABO/RH: ABO/RH(D): O POS

## 2011-07-01 LAB — LIPID PANEL
HDL: 34 — ABNORMAL LOW
Total CHOL/HDL Ratio: 4.4
Triglycerides: 205 — ABNORMAL HIGH
VLDL: 41 — ABNORMAL HIGH

## 2011-07-01 LAB — PROTIME-INR: Prothrombin Time: 13.7

## 2012-05-18 ENCOUNTER — Other Ambulatory Visit (HOSPITAL_COMMUNITY): Payer: Self-pay | Admitting: Internal Medicine

## 2012-05-18 ENCOUNTER — Ambulatory Visit (HOSPITAL_COMMUNITY)
Admission: RE | Admit: 2012-05-18 | Discharge: 2012-05-18 | Disposition: A | Discharge status: Home / Self Care | Payer: Medicare Other | Source: Ambulatory Visit | Attending: Internal Medicine | Admitting: Internal Medicine

## 2012-05-18 DIAGNOSIS — R0602 Shortness of breath: Secondary | ICD-10-CM | POA: Insufficient documentation

## 2012-06-01 ENCOUNTER — Ambulatory Visit (INDEPENDENT_AMBULATORY_CARE_PROVIDER_SITE_OTHER): Payer: Medicare Other | Admitting: Cardiology

## 2012-06-01 ENCOUNTER — Encounter: Payer: Self-pay | Admitting: Cardiology

## 2012-06-01 ENCOUNTER — Encounter: Payer: Self-pay | Admitting: *Deleted

## 2012-06-01 VITALS — BP 128/80 | HR 82 | Ht 71.0 in | Wt 239.0 lb

## 2012-06-01 DIAGNOSIS — N183 Chronic kidney disease, stage 3 unspecified: Secondary | ICD-10-CM

## 2012-06-01 DIAGNOSIS — D631 Anemia in chronic kidney disease: Secondary | ICD-10-CM | POA: Insufficient documentation

## 2012-06-01 DIAGNOSIS — E119 Type 2 diabetes mellitus without complications: Secondary | ICD-10-CM | POA: Insufficient documentation

## 2012-06-01 DIAGNOSIS — E785 Hyperlipidemia, unspecified: Secondary | ICD-10-CM

## 2012-06-01 DIAGNOSIS — N189 Chronic kidney disease, unspecified: Secondary | ICD-10-CM

## 2012-06-01 DIAGNOSIS — I10 Essential (primary) hypertension: Secondary | ICD-10-CM | POA: Insufficient documentation

## 2012-06-01 DIAGNOSIS — D649 Anemia, unspecified: Secondary | ICD-10-CM

## 2012-06-01 DIAGNOSIS — I7090 Unspecified atherosclerosis: Secondary | ICD-10-CM

## 2012-06-01 DIAGNOSIS — I2510 Atherosclerotic heart disease of native coronary artery without angina pectoris: Secondary | ICD-10-CM

## 2012-06-01 DIAGNOSIS — I25119 Atherosclerotic heart disease of native coronary artery with unspecified angina pectoris: Secondary | ICD-10-CM | POA: Insufficient documentation

## 2012-06-01 DIAGNOSIS — I679 Cerebrovascular disease, unspecified: Secondary | ICD-10-CM | POA: Insufficient documentation

## 2012-06-01 DIAGNOSIS — I739 Peripheral vascular disease, unspecified: Secondary | ICD-10-CM

## 2012-06-01 HISTORY — DX: Chronic kidney disease, stage 3 unspecified: N18.30

## 2012-06-01 HISTORY — DX: Anemia in chronic kidney disease: D63.1

## 2012-06-01 NOTE — Progress Notes (Deleted)
Name: Stephen [name]    DOB: Jul 15, 1941  Age: 71 y.o.  MR#: [id number]       PCP:  Carylon Perches, MD      Insurance: @PAYORNAME @   CC:   No chief complaint on file.   VS BP 128/80  Pulse 82  Ht 5\' 11"  (1.803 m)  Wt 239 lb (108.41 kg)  BMI 33.33 kg/m2  SpO2 95%  Weights Current Weight  06/01/12 239 lb (108.41 kg)  06/30/11 230 lb (104.327 kg)    Blood Pressure  BP Readings from Last 3 Encounters:  06/01/12 128/80  06/30/11 122/68     Admit date:  (Not on file) Last encounter with RMR:  Visit date not found   Allergy No Known Allergies  Current Outpatient Prescriptions  Medication Sig Dispense Refill  . amLODipine (NORVASC) 5 MG tablet Take 5 mg by mouth daily.      Marland Kitchen aspirin 81 MG tablet Take 81 mg by mouth daily.      Marland Kitchen atenolol (TENORMIN) 50 MG tablet Take 50 mg by mouth daily.        . clopidogrel (PLAVIX) 75 MG tablet daily.       Marland Kitchen doxazosin (CARDURA) 2 MG tablet Take 2 mg by mouth at bedtime.      . insulin aspart (NOVOLOG) 100 UNIT/ML injection Inject 25 Units into the skin 2 (two) times daily.       . insulin glargine (LANTUS) 100 UNIT/ML injection Inject 55 Units into the skin at bedtime.       Marland Kitchen lisinopril (PRINIVIL,ZESTRIL) 20 MG tablet Take 20 mg by mouth daily.        . pravastatin (PRAVACHOL) 40 MG tablet Take 40 mg by mouth daily.        Discontinued Meds:   There are no discontinued medications.  There is no problem list on file for this patient.   LABS No visits with results within 3 Month(s) from this visit. Latest known visit with results is:  Admission on 07/05/2008, Discharged on 07/09/2008  Component Date Value  . WBC 07/05/2008 5.8   . RBC 07/05/2008 3.80*  . Hemoglobin 07/05/2008 12.1*  . HCT 07/05/2008 35.2*  . MCV 07/05/2008 92.6   . MCHC 07/05/2008 34.5   . RDW 07/05/2008 13.5   . Platelets 07/05/2008 183   . Sodium 07/05/2008 136   . Potassium 07/05/2008 5.1   . Chloride 07/05/2008 102   . CO2 07/05/2008 24   . Glucose, Bld  07/05/2008 312*  . BUN 07/05/2008 22   . Creatinine, Ser 07/05/2008 1.33   . Calcium 07/05/2008 9.6   . Total Protein 07/05/2008 7.2   . Albumin 07/05/2008 3.6   . AST 07/05/2008 21   . ALT 07/05/2008 15   . Alkaline Phosphatase 07/05/2008 75   . Total Bilirubin 07/05/2008 0.5   . GFR calc non Af Amer 07/05/2008 54*  . GFR calc Af Amer 07/05/2008                     Value:>60                                The eGFR has been calculated                         using the MDRD equation.  This calculation has not been                         validated in all clinical  . Neutrophils Relative 07/05/2008 55   . Neutro Abs 07/05/2008 3.2   . Lymphocytes Relative 07/05/2008 29   . Lymphs Abs 07/05/2008 1.7   . Monocytes Relative 07/05/2008 9   . Monocytes Absolute 07/05/2008 0.5   . Eosinophils Relative 07/05/2008 7*  . Eosinophils Absolute 07/05/2008 0.4   . Basophils Relative 07/05/2008 1   . Basophils Absolute 07/05/2008 0.0   . Prothrombin Time 07/05/2008 13.6   . INR 07/05/2008 1.0   . aPTT 07/05/2008 28   . Color, Urine 07/05/2008 YELLOW   . APPearance 07/05/2008 CLEAR   . Specific Gravity, Urine 07/05/2008 1.011   . pH 07/05/2008 7.0   . Glucose, UA 07/05/2008 500*  . Hgb urine dipstick 07/05/2008 NEGATIVE   . Bilirubin Urine 07/05/2008 NEGATIVE   . Ketones, ur 07/05/2008 NEGATIVE   . Protein, ur 07/05/2008 30*  . Urobilinogen, UA 07/05/2008 0.2   . Nitrite 07/05/2008 NEGATIVE   . Leukocytes, UA 07/05/2008 SMALL*  . Squamous Epithelial / LPF 07/05/2008 RARE   . WBC, UA 07/05/2008 3-6   . RBC / HPF 07/05/2008 0-2   . Bacteria, UA 07/05/2008 RARE   . Glucose-Capillary 07/05/2008 209*  . Total CK 07/05/2008 76   . CK, MB 07/05/2008 2.3   . Relative Index 07/05/2008                     Value:RELATIVE INDEX IS INVALID                         WHEN CK < 100 U/L                                 . Troponin I 07/05/2008                     Value:0.04                                 NO INDICATION OF                         MYOCARDIAL INJURY.  . Total CK 07/05/2008 69   . CK, MB 07/05/2008 2.2   . Troponin I 07/05/2008                     Value:0.02                                NO INDICATION OF                         MYOCARDIAL INJURY.  . Relative Index 07/05/2008                     Value:RELATIVE INDEX IS INVALID                         WHEN CK < 100 U/L                                 .  Hemoglobin A1C 07/05/2008 *                   Value:7.2                         (NOTE)   The ADA recommends the following therapeutic goal for glycemic   control related to Hgb A1C measurement:   Goal of Therapy:   < 7.0% Hgb A1C   Reference: American Diabetes Association: Clinical Practice   Recommendations 2008, Diabetes Care,                          2008, 31:(Suppl 1).  . Mean Plasma Glucose 07/05/2008 160   . Glucose-Capillary 07/05/2008 274*  . Comment 1 07/05/2008 Documented in Chart   . Sodium 07/06/2008 138   . Potassium 07/06/2008 4.1 DELTA CHECK NOTED   . Chloride 07/06/2008 103   . CO2 07/06/2008 26   . Glucose, Bld 07/06/2008 154*  . BUN 07/06/2008 20   . Creatinine, Ser 07/06/2008 1.33   . Calcium 07/06/2008 9.4   . GFR calc non Af Amer 07/06/2008 54*  . GFR calc Af Amer 07/06/2008                     Value:>60                                The eGFR has been calculated                         using the MDRD equation.                         This calculation has not been                         validated in all clinical  . Total CK 07/06/2008 61   . CK, MB 07/06/2008 1.6   . Troponin I 07/06/2008                     Value:0.01                                NO INDICATION OF                         MYOCARDIAL INJURY.  . Relative Index 07/06/2008                     Value:RELATIVE INDEX IS INVALID                         WHEN CK < 100 U/L                                 . WBC 07/06/2008 6.5   . RBC 07/06/2008 3.54*  .  Hemoglobin 07/06/2008 11.3*  . HCT 07/06/2008 32.9*  . MCV 07/06/2008 92.8   . MCHC 07/06/2008 34.3   . RDW 07/06/2008 13.7   . Platelets 07/06/2008 170   . Cholesterol 07/06/2008  Value:174                                ATP III CLASSIFICATION:                          <200     mg/dL   Desirable                          200-239  mg/dL   Borderline High                          >=240    mg/dL   High  . Triglycerides 07/06/2008 441*  . HDL 07/06/2008 30*  . Total CHOL/HDL Ratio 07/06/2008 5.8   . VLDL 07/06/2008 UNABLE TO CALCULATE IF TRIGLYCERIDE OVER 400 mg/dL   . LDL Cholesterol 07/06/2008                     Value:UNABLE TO CALCULATE IF TRIGLYCERIDE OVER 400 mg/dL                                Total Cholesterol/HDL:CHD Risk                         Coronary Heart Disease Risk Table                                             Men   Women                          1/2 Average Risk   3.4   3.3  . Glucose-Capillary 07/06/2008 201*  . Glucose-Capillary 07/06/2008 282*  . Glucose-Capillary 07/06/2008 260*  . Glucose-Capillary 07/06/2008 205*  . Glucose-Capillary 07/07/2008 218*  . Glucose-Capillary 07/07/2008 191*  . Glucose-Capillary 07/07/2008 170*  . Glucose-Capillary 07/07/2008 243*  . Sodium 07/08/2008 130*  . Potassium 07/08/2008 5.2*  . Chloride 07/08/2008 98   . CO2 07/08/2008 21   . Glucose, Bld 07/08/2008 194*  . BUN 07/08/2008 31*  . Creatinine, Ser 07/08/2008 1.58*  . Calcium 07/08/2008 9.0   . GFR calc non Af Amer 07/08/2008 44*  . GFR calc Af Amer 07/08/2008 *                   Value:53                                The eGFR has been calculated                         using the MDRD equation.                         This calculation has not been                         validated in all clinical  . WBC 07/08/2008 7.9 WHITE COUNT CONFIRMED ON SMEAR   . RBC 07/08/2008  3.66*  . Hemoglobin 07/08/2008 11.9*  . HCT 07/08/2008 33.9*  . MCV  07/08/2008 92.7   . MCHC 07/08/2008 35.1   . RDW 07/08/2008 13.2   . Platelets 07/08/2008 206 LARGE PLATELETS PRESENT   . Glucose-Capillary 07/08/2008 187*  . Sodium 07/08/2008 132*  . Potassium 07/08/2008 4.6   . Chloride 07/08/2008 99   . CO2 07/08/2008 26   . Glucose, Bld 07/08/2008 221*  . BUN 07/08/2008 31*  . Creatinine, Ser 07/08/2008 1.59*  . Calcium 07/08/2008 9.3   . GFR calc non Af Amer 07/08/2008 44*  . GFR calc Af Amer 07/08/2008 *                   Value:53                                The eGFR has been calculated                         using the MDRD equation.                         This calculation has not been                         validated in all clinical  . Glucose-Capillary 07/08/2008 213*  . Glucose-Capillary 07/08/2008 182*  . Glucose-Capillary 07/08/2008 252*  . Sodium 07/09/2008 133*  . Potassium 07/09/2008 4.7   . Chloride 07/09/2008 98   . CO2 07/09/2008 25   . Glucose, Bld 07/09/2008 207*  . BUN 07/09/2008 36*  . Creatinine, Ser 07/09/2008 1.59*  . Calcium 07/09/2008 9.2   . GFR calc non Af Amer 07/09/2008 44*  . GFR calc Af Amer 07/09/2008 *                   Value:53                                The eGFR has been calculated                         using the MDRD equation.                         This calculation has not been                         validated in all clinical  . Glucose-Capillary 07/09/2008 224*  . Comment 1 07/09/2008 Notify RN   . Comment 2 07/09/2008 Documented in Chart   . Glucose-Capillary 07/09/2008 171*     Results for this Opt Visit:     Results for orders placed during the hospital encounter of 07/05/08  CBC      Component Value Range   WBC 5.8     RBC 3.80 (*)    Hemoglobin 12.1 (*)    HCT 35.2 (*)    MCV 92.6     MCHC 34.5     RDW 13.5     Platelets 183    COMPREHENSIVE METABOLIC PANEL      Component Value Range   Sodium 136     Potassium 5.1     Chloride  102     CO2 24     Glucose, Bld 312  (*)    BUN 22     Creatinine, Ser 1.33     Calcium 9.6     Total Protein 7.2     Albumin 3.6     AST 21     ALT 15     Alkaline Phosphatase 75     Total Bilirubin 0.5     GFR calc non Af Amer 54 (*)    GFR calc Af Amer       Value: >60            The eGFR has been calculated     using the MDRD equation.     This calculation has not been     validated in all clinical  DIFFERENTIAL      Component Value Range   Neutrophils Relative 55     Neutro Abs 3.2     Lymphocytes Relative 29     Lymphs Abs 1.7     Monocytes Relative 9     Monocytes Absolute 0.5     Eosinophils Relative 7 (*)    Eosinophils Absolute 0.4     Basophils Relative 1     Basophils Absolute 0.0    PROTIME-INR      Component Value Range   Prothrombin Time 13.6     INR 1.0    APTT      Component Value Range   aPTT 28    URINALYSIS, ROUTINE W REFLEX MICROSCOPIC      Component Value Range   Color, Urine YELLOW     APPearance CLEAR     Specific Gravity, Urine 1.011     pH 7.0     Glucose, UA 500 (*)    Hgb urine dipstick NEGATIVE     Bilirubin Urine NEGATIVE     Ketones, ur NEGATIVE     Protein, ur 30 (*)    Urobilinogen, UA 0.2     Nitrite NEGATIVE     Leukocytes, UA SMALL (*)   URINE MICROSCOPIC-ADD ON      Component Value Range   Squamous Epithelial / LPF RARE     WBC, UA 3-6     RBC / HPF 0-2     Bacteria, UA RARE    GLUCOSE, CAPILLARY      Component Value Range   Glucose-Capillary 209 (*)   CK TOTAL AND CKMB      Component Value Range   Total CK 76     CK, MB 2.3     Relative Index       Value: RELATIVE INDEX IS INVALID     WHEN CK < 100 U/L             TROPONIN I      Component Value Range   Troponin I       Value: 0.04            NO INDICATION OF     MYOCARDIAL INJURY.  CARDIAC PANEL(CRET KIN+CKTOT+MB+TROPI)      Component Value Range   Total CK 69     CK, MB 2.2     Troponin I       Value: 0.02            NO INDICATION OF     MYOCARDIAL INJURY.   Relative Index        Value: RELATIVE INDEX IS INVALID     WHEN  CK < 100 U/L             HEMOGLOBIN A1C      Component Value Range   Hemoglobin A1C   (*)    Value: 7.2     (NOTE)   The ADA recommends the following therapeutic goal for glycemic   control related to Hgb A1C measurement:   Goal of Therapy:   < 7.0% Hgb A1C   Reference: American Diabetes Association: Clinical Practice   Recommendations 2008, Diabetes Care,      2008, 31:(Suppl 1).   Mean Plasma Glucose 160  None avail  GLUCOSE, CAPILLARY      Component Value Range   Glucose-Capillary 274 (*)    Comment 1 Documented in Chart    BASIC METABOLIC PANEL      Component Value Range   Sodium 138     Potassium 4.1 DELTA CHECK NOTED     Chloride 103     CO2 26     Glucose, Bld 154 (*)    BUN 20     Creatinine, Ser 1.33     Calcium 9.4     GFR calc non Af Amer 54 (*)    GFR calc Af Amer       Value: >60            The eGFR has been calculated     using the MDRD equation.     This calculation has not been     validated in all clinical  CARDIAC PANEL(CRET KIN+CKTOT+MB+TROPI)      Component Value Range   Total CK 61     CK, MB 1.6     Troponin I       Value: 0.01            NO INDICATION OF     MYOCARDIAL INJURY.   Relative Index       Value: RELATIVE INDEX IS INVALID     WHEN CK < 100 U/L             CBC      Component Value Range   WBC 6.5     RBC 3.54 (*)    Hemoglobin 11.3 (*)    HCT 32.9 (*)    MCV 92.8     MCHC 34.3     RDW 13.7     Platelets 170    LIPID PANEL      Component Value Range   Cholesterol       Value: 174            ATP III CLASSIFICATION:      <200     mg/dL   Desirable      161-096  mg/dL   Borderline High      >=240    mg/dL   High   Triglycerides 441 (*)    HDL 30 (*)    Total CHOL/HDL Ratio 5.8     VLDL UNABLE TO CALCULATE IF TRIGLYCERIDE OVER 400 mg/dL     LDL Cholesterol       Value: UNABLE TO CALCULATE IF TRIGLYCERIDE OVER 400 mg/dL            Total Cholesterol/HDL:CHD Risk     Coronary Heart  Disease Risk Table                         Men   Women      1/2 Average Risk  3.4   3.3  GLUCOSE, CAPILLARY      Component Value Range   Glucose-Capillary 201 (*)   GLUCOSE, CAPILLARY      Component Value Range   Glucose-Capillary 282 (*)   GLUCOSE, CAPILLARY      Component Value Range   Glucose-Capillary 260 (*)   GLUCOSE, CAPILLARY      Component Value Range   Glucose-Capillary 205 (*)   GLUCOSE, CAPILLARY      Component Value Range   Glucose-Capillary 218 (*)   GLUCOSE, CAPILLARY      Component Value Range   Glucose-Capillary 191 (*)   GLUCOSE, CAPILLARY      Component Value Range   Glucose-Capillary 170 (*)   GLUCOSE, CAPILLARY      Component Value Range   Glucose-Capillary 243 (*)   BASIC METABOLIC PANEL      Component Value Range   Sodium 130 (*)    Potassium 5.2 (*)    Chloride 98     CO2 21     Glucose, Bld 194 (*)    BUN 31 (*)    Creatinine, Ser 1.58 (*)    Calcium 9.0     GFR calc non Af Amer 44 (*)    GFR calc Af Amer   (*)    Value: 53            The eGFR has been calculated     using the MDRD equation.     This calculation has not been     validated in all clinical  CBC      Component Value Range   WBC 7.9 WHITE COUNT CONFIRMED ON SMEAR     RBC 3.66 (*)    Hemoglobin 11.9 (*)    HCT 33.9 (*)    MCV 92.7     MCHC 35.1     RDW 13.2     Platelets 206 LARGE PLATELETS PRESENT    GLUCOSE, CAPILLARY      Component Value Range   Glucose-Capillary 187 (*)   BASIC METABOLIC PANEL      Component Value Range   Sodium 132 (*)    Potassium 4.6     Chloride 99     CO2 26     Glucose, Bld 221 (*)    BUN 31 (*)    Creatinine, Ser 1.59 (*)    Calcium 9.3     GFR calc non Af Amer 44 (*)    GFR calc Af Amer   (*)    Value: 53            The eGFR has been calculated     using the MDRD equation.     This calculation has not been     validated in all clinical  GLUCOSE, CAPILLARY      Component Value Range   Glucose-Capillary 213 (*)   GLUCOSE,  CAPILLARY      Component Value Range   Glucose-Capillary 182 (*)   GLUCOSE, CAPILLARY      Component Value Range   Glucose-Capillary 252 (*)   BASIC METABOLIC PANEL      Component Value Range   Sodium 133 (*)    Potassium 4.7     Chloride 98     CO2 25     Glucose, Bld 207 (*)    BUN 36 (*)    Creatinine, Ser 1.59 (*)    Calcium 9.2     GFR calc non Af Amer 44 (*)  GFR calc Af Amer   (*)    Value: 53            The eGFR has been calculated     using the MDRD equation.     This calculation has not been     validated in all clinical  GLUCOSE, CAPILLARY      Component Value Range   Glucose-Capillary 224 (*)    Comment 1 Notify RN     Comment 2 Documented in Chart    GLUCOSE, CAPILLARY      Component Value Range   Glucose-Capillary 171 (*)     EKG No orders found for this or any previous visit.   Prior Assessment and Plan    Imaging: Dg Chest 2 View  05/18/2012  *RADIOLOGY REPORT*  Clinical Data: Persistent shortness of breath.  CHEST - 2 VIEW  Comparison: 06/17/2009.  Findings: Trachea is midline.  Heart size normal.  Lungs are somewhat low in volume but clear.  No pleural fluid.  IMPRESSION: No acute findings.  No change from 06/17/2009.   Original Report Authenticated By: Reyes Ivan, M.D.      [facility] Calculation: Score not calculated. Missing: Total Cholesterol

## 2012-06-01 NOTE — Assessment & Plan Note (Signed)
Excellent control of diabetes based upon a recent normal A1c.

## 2012-06-01 NOTE — Assessment & Plan Note (Signed)
Moderate claudication in this gentleman with a sedentary lifestyle is well tolerated.  He understands that intervention will be deferred until he finds his symptoms to be significantly limiting.

## 2012-06-01 NOTE — Assessment & Plan Note (Signed)
Has also done well since bilateral carotid surgery, most recently on the left in 2008.  He is followed by Dr. Hart Rochester for his vascular disease.  Carotid ultrasound was recently performed in 01/2011 at which time both ICAs were patent without significant focal stenosis.

## 2012-06-01 NOTE — Patient Instructions (Addendum)
Your physician recommends that you schedule a follow-up appointment in: 1 year  

## 2012-06-01 NOTE — Assessment & Plan Note (Signed)
Hypertension appears to be well controlled with current medication, which will be continued.

## 2012-06-01 NOTE — Assessment & Plan Note (Signed)
Patient has remained remarkably stable since an initial intervention for coronary disease nearly 20 years ago.  We will continue to optimally manage cardiovascular risk factors.

## 2012-06-01 NOTE — Assessment & Plan Note (Signed)
Good control of hyperlipidemia with current therapy, which will be continued.

## 2012-06-01 NOTE — Progress Notes (Signed)
Patient ID: Stephen [name], male   DOB: 22-Dec-1940, 71 y.o.   MRN: [id number]  HPI:  Pleasant gentleman with widespread vascular disease previously followed by me but not seen for the past 5 years now returns for reassessment and continuing cardiology care.  He has had no cardiac problems of note since undergoing percutaneous intervention with stenting of an LAD lesion in 1994.  He subsequently required bilateral carotid endarterectomies and has peripheral vascular disease with claudication.  He has not experienced significant medical problems during the past few years, but was hospitalized in 2009 with neurologic symptoms.  Stroke was excluded, and no specific etiology was identified for his neurologic problems.  Current Outpatient Prescriptions on File Prior to Visit  Medication Sig Dispense Refill  . amLODipine (NORVASC) 5 MG tablet Take 5 mg by mouth daily.      Marland Kitchen atenolol (TENORMIN) 50 MG tablet Take 50 mg by mouth daily.        . clopidogrel (PLAVIX) 75 MG tablet daily.       Marland Kitchen doxazosin (CARDURA) 2 MG tablet Take 2 mg by mouth at bedtime.      . insulin aspart (NOVOLOG) 100 UNIT/ML injection Inject 25 Units into the skin 2 (two) times daily.       . insulin glargine (LANTUS) 100 UNIT/ML injection Inject 55 Units into the skin at bedtime.       Marland Kitchen lisinopril (PRINIVIL,ZESTRIL) 20 MG tablet Take 20 mg by mouth daily.         No Known Allergies    Past Medical History  Diagnosis Date  . Diabetes mellitus type II   . Hypertension   . Hyperlipidemia   . Cerebrovascular disease     H/o CVA In 2001 followed by a right carotid endarterectomy; left carotid endarterectomy in 2008  . Arteriosclerotic cardiovascular disease (ASCVD)     PTCA of LAD in 1994; total obstruction of the RCA treated medically; subsequent stress nuclear study with inferior ischemia  . Peripheral vascular disease   . DDD (degenerative disc disease), lumbar     Lumbar surgery in 1984    Past Surgical History  Procedure  Date  . Carotid endarterectomy 2008    Left  . Appendectomy   . Lumbar disc surgery 2000, 2001    L5 HNP required 2 surgical procedures    Family History  Problem Relation Age of Onset  . Heart disease Mother   . Diabetes Mother   . Diabetes Sister   . Heart disease Brother     History   Social History  . Marital Status: Widowed    Spouse Name: N/A    Number of Children: N/A  . Years of Education: N/A   Occupational History  . Not on file.   Social History Main Topics  . Smoking status: Former Smoker -- 3.0 packs/day for 10 years    Types: Cigarettes    Quit date: 09/21/1972  . Smokeless tobacco: Not on file  . Alcohol Use: No  . Drug Use: No  . Sexually Active: Yes    Birth Control/ Protection: Post-menopausal   Other Topics Concern  . Not on file   Social History Narrative  . No narrative on file    ROS: Gen. Malaise and lack of energy; recent rhinitis attributed to allergies and associated with some difficulty breathing-now resolved; mild arthritic discomfort.  All other systems reviewed and are negative.  PHYSICAL EXAM: BP 128/80  Pulse 82  Ht 5\' 11"  (1.803 m)  Wt 108.41 kg (239 lb)  BMI 33.33 kg/m2  SpO2 95%  General-Well-developed; no acute distress Body Habitus-overweight HEENT-[place]/AT; PERRL; EOM intact; conjunctiva and lids nl Neck-No JVD; no carotid bruits Endocrine-No thyromegaly Lungs-Clear lung fields; resonant percussion; normal I-to-E ratio Cardiovascular- normal PMI; normal S1 and S2 Abdomen-BS normal; soft and non-tender without masses or organomegaly Musculoskeletal-No deformities, cyanosis or clubbing Neurologic-Nl cranial nerves; symmetric strength and tone Skin- Warm, no significant lesions Extremities-0-1+ distal pulses; 1+ ankle edema  EKG:  Normal sinus rhythm with frequent PACs, ST-T wave abnormalities consistent with inferior ischemia or prior inferior MI, delayed R-wave progression, low voltage, ST-T wave abnormality consistent  with LVH or lateral ischemia.   ASSESSMENT AND PLAN:  Magoffin Bing, MD 06/01/2012 9:02 PM

## 2012-06-03 NOTE — Addendum Note (Signed)
Addended by: Reather Laurence A on: 06/03/2012 10:27 AM   Modules accepted: Orders

## 2012-06-27 ENCOUNTER — Encounter: Payer: Self-pay | Admitting: Vascular Surgery

## 2012-06-28 ENCOUNTER — Ambulatory Visit (INDEPENDENT_AMBULATORY_CARE_PROVIDER_SITE_OTHER): Payer: Medicare Other | Admitting: Vascular Surgery

## 2012-06-28 ENCOUNTER — Encounter (INDEPENDENT_AMBULATORY_CARE_PROVIDER_SITE_OTHER): Payer: Medicare Other | Admitting: Vascular Surgery

## 2012-06-28 ENCOUNTER — Encounter: Payer: Self-pay | Admitting: Vascular Surgery

## 2012-06-28 ENCOUNTER — Other Ambulatory Visit: Payer: Self-pay | Admitting: *Deleted

## 2012-06-28 VITALS — BP 150/79 | HR 81 | Ht 71.0 in | Wt 238.0 lb

## 2012-06-28 DIAGNOSIS — I6529 Occlusion and stenosis of unspecified carotid artery: Secondary | ICD-10-CM

## 2012-06-28 DIAGNOSIS — I70219 Atherosclerosis of native arteries of extremities with intermittent claudication, unspecified extremity: Secondary | ICD-10-CM

## 2012-06-28 DIAGNOSIS — Z48812 Encounter for surgical aftercare following surgery on the circulatory system: Secondary | ICD-10-CM

## 2012-06-28 DIAGNOSIS — I739 Peripheral vascular disease, unspecified: Secondary | ICD-10-CM

## 2012-06-28 NOTE — Progress Notes (Unsigned)
Bilateral lower extremity arterial duplex performed @ [facility] 06/28/2012

## 2012-06-28 NOTE — Progress Notes (Signed)
Carotid duplex performed @ [facility] 06/28/2012

## 2012-06-28 NOTE — Progress Notes (Signed)
Ankle brachial index performed @ [facility] 06/28/2012 

## 2012-06-28 NOTE — Progress Notes (Signed)
Subjective:     Patient ID: Stephen [name], male   DOB: 03-17-1941, 71 y.o.   MRN: [id number]  HPI this 71 year old male returns today for continued followup regarding his lower extremity occlusive disease and cerebrovascular disease. He continues to have claudication symptoms in both calves right equal to left. This requires him to stop walking about one half block. He has no history rest pain, nonhealing ulcers, infection, or gangrene. He denies any neurologic symptoms such as lateralizing weakness, aphasia, diplopia, blurred vision, or syncope. He has had previous bilateral carotid endarterectomies and 01 and  in 08.  Past Medical History  Diagnosis Date  . Diabetes mellitus type II   . Hypertension   . Hyperlipidemia   . Cerebrovascular disease     H/o CVA In 2001 followed by a right carotid endarterectomy; left carotid endarterectomy in 2008  . Arteriosclerotic cardiovascular disease (ASCVD)     PTCA of LAD in 1994; total obstruction of the RCA treated medically; subsequent stress nuclear study with inferior ischemia  . Peripheral vascular disease   . DDD (degenerative disc disease), lumbar     Lumbar surgery in 1984  . Carotid artery occlusion   . Stroke 2001    right brain    History  Substance Use Topics  . Smoking status: Former Smoker -- 3.0 packs/day for 10 years    Types: Cigarettes    Quit date: 09/21/1972  . Smokeless tobacco: Never Used  . Alcohol Use: No    Family History  Problem Relation Age of Onset  . Heart disease Mother   . Diabetes Mother   . Diabetes Sister   . Heart disease Brother     No Known Allergies  Current outpatient prescriptions:amLODipine (NORVASC) 5 MG tablet, Take 5 mg by mouth daily., Disp: , Rfl: ;  aspirin 81 MG tablet, Take 81 mg by mouth daily., Disp: , Rfl: ;  atenolol (TENORMIN) 50 MG tablet, Take 50 mg by mouth daily.  , Disp: , Rfl: ;  clopidogrel (PLAVIX) 75 MG tablet, daily. , Disp: , Rfl: ;  doxazosin (CARDURA) 2 MG tablet,  Take 2 mg by mouth at bedtime., Disp: , Rfl:  fenofibrate 160 MG tablet, Take 160 mg by mouth daily., Disp: , Rfl: ;  insulin aspart (NOVOLOG) 100 UNIT/ML injection, Inject 25 Units into the skin 2 (two) times daily. , Disp: , Rfl: ;  insulin glargine (LANTUS) 100 UNIT/ML injection, Inject 55 Units into the skin at bedtime. , Disp: , Rfl: ;  lisinopril (PRINIVIL,ZESTRIL) 20 MG tablet, Take 20 mg by mouth daily.  , Disp: , Rfl:  pravastatin (PRAVACHOL) 40 MG tablet, Take 40 mg by mouth daily., Disp: , Rfl:   BP 150/79  Pulse 81  Ht 5\' 11"  (1.803 m)  Wt 238 lb (107.956 kg)  BMI 33.19 kg/m2  SpO2 96%  Body mass index is 33.19 kg/(m^2).           Review of Systems denies chest pain, dyspnea on exertion, PND, orthopnea, hemoptysis. All other systems negative and complete review of systems     Objective:   Physical Exam blood pressure 150/79 heart rate 81 respirations 16 Gen.-alert and oriented x3 in no apparent distress HEENT normal for age Lungs no rhonchi or wheezing Cardiovascular regular rhythm no murmurs carotid pulses 3+ palpable no bruits audible Abdomen soft nontender no palpable masses Musculoskeletal free of  major deformities Skin clear -no rashes Neurologic normal Lower extremities 3+ femoral and 1-2+ popliteal pulses palpable bilaterally.  No pedal pulses palpable in either lower extremity. Both he has motion and sensation intact without ulceration or ischemia.  Today I ordered lower extremity arterial Dopplers which are reviewed and interpreted. ABI is are fairly stable around 60% bilaterally. There has been increased velocity in both the mid superficial femoral arteries. I believe these vessels are patent with severely stenotic segments. These would likely be good candidates for PTA and stenting if he should decide he desires this. Also ordered a carotid duplex exam which are reviewed and interpreted. There is no significant restenosis in either internal carotid artery  post carotid endarterectomies. There is a significant right external carotid stenosis.      Assessment:     Stable bilateral calf claudication secondary to severe mid superficial femoral artery stenoses with high velocities-stable ABIs Stable post bilateral carotid endarterectomy-asymptomatic    Plan:     Patient will return in one year with repeat ABIs in duplex scan of lower extremity arterial system as well as carotid duplex exam. The patient should decide that claudication symptoms are worsening significantly and limiting him more we can arrange aortobifemoral angiography with bilateral lower extremity runoff with possible PTA and stenting of both superficial femoral arteries to relieve his symptoms

## 2012-06-29 NOTE — Addendum Note (Signed)
Addended by: Lorin Mercy K on: 06/29/2012 10:30 AM   Modules accepted: Orders

## 2013-07-03 ENCOUNTER — Encounter: Payer: Self-pay | Admitting: Vascular Surgery

## 2013-07-04 ENCOUNTER — Ambulatory Visit (INDEPENDENT_AMBULATORY_CARE_PROVIDER_SITE_OTHER)
Admission: RE | Admit: 2013-07-04 | Discharge: 2013-07-04 | Disposition: A | Discharge status: Home / Self Care | Payer: Medicare Other | Source: Ambulatory Visit | Attending: Vascular Surgery | Admitting: Vascular Surgery

## 2013-07-04 ENCOUNTER — Ambulatory Visit (HOSPITAL_COMMUNITY)
Admission: RE | Admit: 2013-07-04 | Discharge: 2013-07-04 | Disposition: A | Discharge status: Home / Self Care | Payer: Medicare Other | Source: Ambulatory Visit | Attending: Vascular Surgery | Admitting: Vascular Surgery

## 2013-07-04 ENCOUNTER — Ambulatory Visit (INDEPENDENT_AMBULATORY_CARE_PROVIDER_SITE_OTHER): Payer: Medicare Other | Admitting: Vascular Surgery

## 2013-07-04 ENCOUNTER — Encounter: Payer: Self-pay | Admitting: Vascular Surgery

## 2013-07-04 DIAGNOSIS — Z48812 Encounter for surgical aftercare following surgery on the circulatory system: Secondary | ICD-10-CM | POA: Insufficient documentation

## 2013-07-04 DIAGNOSIS — I739 Peripheral vascular disease, unspecified: Secondary | ICD-10-CM

## 2013-07-04 DIAGNOSIS — I6529 Occlusion and stenosis of unspecified carotid artery: Secondary | ICD-10-CM | POA: Insufficient documentation

## 2013-07-04 NOTE — Progress Notes (Signed)
F/U Established Carotid Patient and PAD  Previous Carotid surgery: Yes Previous CVA - Yes on right with LUE weakness Right 2001; left 2008 Surgeon: Betti Cruz, MD  History of Present Illness 1. Carotid F/U Stephen [name] is a 72 y.o. male who has had   previous Bilateral CEA. He has IDDM and remote history of smoking.  Patient had had Positive history of TIA or stroke symptom with LUE weakness and left facial droop. He has residual facial droop and fine motor diff on left. He states the left side otherwise is normal.  The patient denies amaurosis fugax or monocular blindness.  The patient  reports facial drooping.  Pt. denies hemiplegia.  The patient denies receptive or expressive aphasia.   The patient's previous neurologic deficits are Unchanged. 2. PAD F/U Pt states he claudicates at 1 block with cramping in both legs , relieved with rest. This is unchanged from a year ago. He denies night or rest pain and has had no non-healing ulcers  New Medical or Surgical History: none  Pt meds include: Statin : Yes Betablocker: Yes ASA: Yes Other anticoagulants/antiplatelets: plavix   Past Medical History  Diagnosis Date  . Diabetes mellitus type II   . Hypertension   . Hyperlipidemia   . Cerebrovascular disease     H/o CVA In 2001 followed by a right carotid endarterectomy; left carotid endarterectomy in 2008  . Arteriosclerotic cardiovascular disease (ASCVD)     PTCA of LAD in 1994; total obstruction of the RCA treated medically; subsequent stress nuclear study with inferior ischemia  . Peripheral vascular disease   . DDD (degenerative disc disease), lumbar     Lumbar surgery in 1984  . Carotid artery occlusion   . Stroke 2001    right brain    Social History History  Substance Use Topics  . Smoking status: Former Smoker -- 3.00 packs/day for 10 years    Types: Cigarettes    Quit date: 09/21/1972  . Smokeless tobacco: Never Used  . Alcohol Use: No    Family  History Family History  Problem Relation Age of Onset  . Heart disease Mother   . Diabetes Mother   . Diabetes Sister   . Heart disease Brother     Surgical History Past Surgical History  Procedure Laterality Date  . Appendectomy    . Lumbar disc surgery  2000, 2001    L5 HNP required 2 surgical procedures  . Carotid endarterectomy  2008    Left   . Carotid endarterectomy  06/15/2000    right    No Known Allergies  Current Outpatient Prescriptions  Medication Sig Dispense Refill  . amLODipine (NORVASC) 5 MG tablet Take 5 mg by mouth daily.      Marland Kitchen aspirin 81 MG tablet Take 81 mg by mouth daily.      Marland Kitchen atenolol (TENORMIN) 50 MG tablet Take 50 mg by mouth daily.        . clopidogrel (PLAVIX) 75 MG tablet daily.       Marland Kitchen doxazosin (CARDURA) 2 MG tablet Take 2 mg by mouth at bedtime.      . fenofibrate 160 MG tablet Take 160 mg by mouth daily.      . insulin aspart (NOVOLOG) 100 UNIT/ML injection Inject 25 Units into the skin 2 (two) times daily.       . insulin glargine (LANTUS) 100 UNIT/ML injection Inject 55 Units into the skin at bedtime.       . Lansoprazole (  PREVACID PO) Take by mouth as needed.      Marland Kitchen lisinopril (PRINIVIL,ZESTRIL) 20 MG tablet Take 20 mg by mouth daily.        . pravastatin (PRAVACHOL) 40 MG tablet Take 40 mg by mouth daily.       No current facility-administered medications for this visit.    Review of Systems : [x]  Positive   [ ]  Denies  General:[ ]  Weight loss,  [ ]  Weight gain, [ ]  Loss of appetite, [ ]  Fever, [ ]  chills  Neurologic: [ ]  Dizziness, [ ]  Blackouts, [ ]  Headaches, [ ]  Seizure [x ] Stroke, [ ]  "Mini stroke", [ ]  Slurred speech, [ ]  Temporary blindness;  [ ] weakness,  Ear/Nose/Throat: [ ]  Change in hearing, [ ]  Nose bleeds, [ ]  Hoarseness  Vascular:[x ] Pain in legs with walking, [ ]  Pain in feet while lying flat , [ ]   Non-healing ulcer, [ ]  Blood clot in vein,    Pulmonary: [ ]  Home oxygen, [ ]   Productive cough, [ ]  Bronchitis, [  ] Coughing up blood,  [ ]  Asthma, [ ]  Wheezing  Musculoskeletal:  [x ] Arthritis, [x ] Joint pain, [ ]  low back pain  Cardiac: [ ]  Chest pain, [ ]  Shortness of breath when lying flat, [ ]  Shortness of breath with exertion, [ ]  Palpitations, [ ]  Heart murmur, [ ]   Atrial fibrillation  Hematologic:[ ]  Easy Bruising, [ ]  Anemia; [ ]  Hepatitis  Psychiatric: [ ]   Depression, [ ]  Anxiety   Gastrointestinal: [ ]  Black stool, [ ]  Blood in stool, [ ]  Peptic ulcer disease,  [ ]  Gastroesophageal Reflux, [ ]  Trouble swallowing, [ ]  Diarrhea, [ ]  Constipation  Urinary: [ ]  chronic Kidney disease, [ ]  on HD, [ ]  Burning with urination, [ ]  Frequent urination, [ ]  Difficulty urinating;   Skin: [ ]  Rashes, [ ]  Wounds    Physical Examination  Filed Vitals:   07/04/13 1522  BP: 149/69  Pulse:     General: WDWN male in NAD GAIT: normal Eyes: PERRLA Pulmonary: normal, non labored breathing  Cardiac: regular Rhythm ,  Vascular:     RIGHT   LEFT CAROTID BRUITS Negative Negative  Dorsalis Pedis/PT monophasic by Doppler monophasic by Doppler   Musculoskeletal: Negative muscle atrophy/wasting  Neurologic: A&O X 3; Appropriate Affect ; SENSATION ;normal; MOTOR FUNCTION: normal 5/5 strength in all tested muscle groups Speech is normal  Non-Invasive Vascular Imaging CAROTID DUPLEX 07/04/2013   Right ICA 0 - 19% stenosis Left ICA 0 - 19% stenosis  Previous carotid studies demonstrated: RICA 0 - 19% stenosis, LICA 0 - 19% stenosis.   ABI's: Right  0.77  Left  0.66  These findings are Unchanged from previous exam  Assessment: Stephen [name] is a 72 y.o. male who is here for F/U bilateral CEA with 0 - 19% Bilateral ICA  stenosis The  ICA stenosis is  Unchanged from previous exam. Pt. Continues to claudicate at 1 block. This is unchanged and does not limit his lifestyle. ABI's are unchanged from a year ago except for slight improvement bilat.  Plan: Follow-up in 1 years with Carotid Duplex  scan and ABI's  Pt was given information regarding stroke symptoms and prevention  Clinic MD: JDL  Agree with above assessment Lower extremity arterial Dopplers are stable as are his symptoms and carotid duplex is also stable-return in one year

## 2013-07-05 NOTE — Addendum Note (Signed)
Addended by: Sharee Pimple on: 07/05/2013 08:38 AM   Modules accepted: Orders

## 2013-09-18 ENCOUNTER — Ambulatory Visit (HOSPITAL_COMMUNITY)
Admission: RE | Admit: 2013-09-18 | Discharge: 2013-09-18 | Disposition: A | Discharge status: Home / Self Care | Payer: Medicare Other | Source: Ambulatory Visit | Attending: Internal Medicine | Admitting: Internal Medicine

## 2013-09-18 ENCOUNTER — Other Ambulatory Visit (HOSPITAL_COMMUNITY): Payer: Self-pay | Admitting: Internal Medicine

## 2013-09-18 DIAGNOSIS — R059 Cough, unspecified: Secondary | ICD-10-CM | POA: Insufficient documentation

## 2013-09-18 DIAGNOSIS — R609 Edema, unspecified: Secondary | ICD-10-CM | POA: Insufficient documentation

## 2013-09-18 DIAGNOSIS — R0602 Shortness of breath: Secondary | ICD-10-CM | POA: Insufficient documentation

## 2013-09-18 DIAGNOSIS — M7989 Other specified soft tissue disorders: Secondary | ICD-10-CM

## 2013-09-18 DIAGNOSIS — J90 Pleural effusion, not elsewhere classified: Secondary | ICD-10-CM | POA: Insufficient documentation

## 2013-09-18 DIAGNOSIS — R05 Cough: Secondary | ICD-10-CM

## 2013-09-27 ENCOUNTER — Ambulatory Visit (INDEPENDENT_AMBULATORY_CARE_PROVIDER_SITE_OTHER): Payer: Medicare PPO | Admitting: Cardiology

## 2013-09-27 ENCOUNTER — Encounter: Payer: Self-pay | Admitting: *Deleted

## 2013-09-27 ENCOUNTER — Encounter: Payer: Self-pay | Admitting: Cardiology

## 2013-09-27 VITALS — BP 139/37 | HR 64 | Ht 72.0 in | Wt 229.0 lb

## 2013-09-27 DIAGNOSIS — R0602 Shortness of breath: Secondary | ICD-10-CM

## 2013-09-27 DIAGNOSIS — I2510 Atherosclerotic heart disease of native coronary artery without angina pectoris: Secondary | ICD-10-CM

## 2013-09-27 DIAGNOSIS — I10 Essential (primary) hypertension: Secondary | ICD-10-CM

## 2013-09-27 DIAGNOSIS — N189 Chronic kidney disease, unspecified: Secondary | ICD-10-CM

## 2013-09-27 DIAGNOSIS — I6529 Occlusion and stenosis of unspecified carotid artery: Secondary | ICD-10-CM

## 2013-09-27 DIAGNOSIS — I739 Peripheral vascular disease, unspecified: Secondary | ICD-10-CM

## 2013-09-27 DIAGNOSIS — I5032 Chronic diastolic (congestive) heart failure: Secondary | ICD-10-CM | POA: Insufficient documentation

## 2013-09-27 DIAGNOSIS — I509 Heart failure, unspecified: Secondary | ICD-10-CM

## 2013-09-27 NOTE — Assessment & Plan Note (Signed)
Symptoms consistent with heart failure, question is diastolic versus new-onset systolic. Previous assessment of LVEF was normal 5 years ago and greater. He does not endorse any angina but has had increasing shortness of breath and fatigue. In the face of known ischemic heart disease, plan will be repeat cardiac structural assessment with echocardiogram, and also objective ischemic evaluation via [place] on medical therapy. He will continue on Lasix with current regimen, and we will see him back to discuss the results.

## 2013-09-27 NOTE — Progress Notes (Signed)
Clinical Summary Stephen [name] is a 73 y.o.male referred to the office by Dr. [name], a former patient of Dr. Lattie Haw last seen in September 2013. History is outlined below. Reviewed recent records. Patient has had evidence of volume overload with shortness of breath, chest x-ray in December 2014 reporting mild edema and small bilateral pleural effusions. He was placed on Lasix by Dr. [name] with significant diuresis and clinical improvement. No recent cardiac testing found.  He is here with his wife today. He states that he began to notice leg edema and increasing shortness of breath approximately one month ago after having a "cold." He has not experienced any chest discomfort but feels weak, more so than normal. He does state that the Lasix has helped, although he is not back to baseline in terms of his energy. He otherwise reports compliance with his medications. ECG today shows sinus rhythm with PACs.  Lower extremity arterial Dopplers from October 2014 demonstrated greater than 50% stenosis at the right distal superficial femoral and left mid and distal superficial femoral arteries, overall stable. Followup carotid Dopplers also in October 2014 demonstrated no significant stenosis of the LICA with less than 67% RICA stenosis - followed by Dr. Kellie Simmering.  No Known Allergies  Current Outpatient Prescriptions  Medication Sig Dispense Refill  . amLODipine (NORVASC) 5 MG tablet Take 5 mg by mouth daily.      Marland Kitchen aspirin 81 MG tablet Take 81 mg by mouth daily.      Marland Kitchen atenolol (TENORMIN) 50 MG tablet Take 50 mg by mouth daily.        . clopidogrel (PLAVIX) 75 MG tablet daily.       Marland Kitchen doxazosin (CARDURA) 2 MG tablet Take 2 mg by mouth at bedtime.      . fenofibrate 160 MG tablet Take 160 mg by mouth daily.      . furosemide (LASIX) 20 MG tablet Take 20 mg by mouth daily.      . insulin aspart (NOVOLOG) 100 UNIT/ML injection Inject 25 Units into the skin 2 (two) times daily.       . insulin glargine  (LANTUS) 100 UNIT/ML injection Inject 55 Units into the skin at bedtime.       . Lansoprazole (PREVACID PO) Take by mouth as needed.      Marland Kitchen lisinopril (PRINIVIL,ZESTRIL) 20 MG tablet Take 20 mg by mouth daily.        . pravastatin (PRAVACHOL) 40 MG tablet Take 40 mg by mouth daily.       No current facility-administered medications for this visit.    Past Medical History  Diagnosis Date  . Diabetes mellitus type II   . Essential hypertension, benign   . Hyperlipidemia   . Cerebrovascular disease     H/o CVA In 2001 followed by a right carotid endarterectomy; left carotid endarterectomy in 2008  . Coronary atherosclerosis of native coronary artery     PTCA of LAD in 1994; total obstruction of the RCA treated medically; subsequent stress nuclear study with inferior ischemia  . Peripheral vascular disease   . DDD (degenerative disc disease), lumbar     Lumbar surgery in 1984  . Carotid artery occlusion   . Stroke 2001    Right brain    Past Surgical History  Procedure Laterality Date  . Appendectomy    . Lumbar disc surgery  2000, 2001    L5 HNP required 2 surgical procedures  . Carotid endarterectomy  2008  Left   . Carotid endarterectomy  06/15/2000    Right    Family History  Problem Relation Age of Onset  . Heart disease Mother   . Diabetes Mother   . Diabetes Sister   . Heart disease Brother     Social History Stephen [name] reports that he quit smoking about 41 years ago. His smoking use included Cigarettes. He has a 30 pack-year smoking history. He has never used smokeless tobacco. Stephen [name] reports that he does not drink alcohol.  Review of Systems No palpitations, sudden dizziness, syncope. Appetite has been diminished recently. Also having some intermittent left-sided abdominal discomfort. No fevers or chills. No nausea or emesis. No orthopnea. Otherwise negative except as outlined.  Physical Examination Filed Vitals:   09/27/13 0958  BP: 139/37  Pulse:  64   Filed Weights   09/27/13 0958  Weight: 229 lb (103.874 kg)   Overweight male, no distress. HEENT: Conjunctiva and lids normal, oropharynx clear. Neck: Supple, no elevated JVP or carotid bruits, no thyromegaly. Lungs: Clear to auscultation, nonlabored breathing at rest. Cardiac: Regular rate and rhythm with ectopy, no obvious S3, soft systolic murmur without diastolic murmur, no pericardial rub. Abdomen: Soft, nontender, bowel sounds present, no guarding or rebound. Extremities: 1+ edema and venous stasis right greater than left, distal pulses 1+.. Skin: Warm and dry. Musculoskeletal: No kyphosis. Neuropsychiatric: Alert and oriented x3, affect grossly appropriate.   Problem List and Plan   Congestive heart failure Symptoms consistent with heart failure, question is diastolic versus new-onset systolic. Previous assessment of LVEF was normal 5 years ago and greater. He does not endorse any angina but has had increasing shortness of breath and fatigue. In the face of known ischemic heart disease, plan will be repeat cardiac structural assessment with echocardiogram, and also objective ischemic evaluation via [place] on medical therapy. He will continue on Lasix with current regimen, and we will see him back to discuss the results.  Coronary atherosclerosis of native coronary artery History of LAD intervention in 1994 with known occlusion of the RCA and prior evidence of inferior ischemia by noninvasive testing. Followup evaluation of ischemic burden to be obtained as noted above.   Essential hypertension, benign Followed by Dr. [name], on Norvasc, Tenormin, and lisinopril.  Diabetes mellitus type II Followed by Dr. [name], on insulin.  Chronic kidney disease Do not have recent testing, creatinine 1.8 in 2013.  Occlusion and stenosis of carotid artery without mention of cerebral infarction Status post bilateral CEA, followed by Dr. Kellie Simmering.  Peripheral vascular  disease Stable claudication, most recent arterial Dopplers noted above.    Satira Sark, M.D., F.A.C.C.

## 2013-09-27 NOTE — Assessment & Plan Note (Signed)
Followed by Dr. [name], on insulin.

## 2013-09-27 NOTE — Patient Instructions (Signed)
Your physician recommends that you schedule a follow-up appointment in: [place] has requested that you have a lexiscan myoview. For further information please visit [email or web address]. Please follow instruction sheet, as given.  Your physician has requested that you have an echocardiogram. Echocardiography is a painless test that uses sound waves to create images of your heart. It provides your doctor with information about the size and shape of your heart and how well your heart's chambers and valves are working. This procedure takes approximately one hour. There are no restrictions for this procedure.  WE WILL CALL YOU WITH YOUR TEST RESULTS/INSTRUCTIONS/NEXT STEPS ONCE RECEIVED BY THE PROVIDER

## 2013-09-27 NOTE — Assessment & Plan Note (Signed)
Followed by Dr. [name], on Norvasc, Tenormin, and lisinopril.

## 2013-09-27 NOTE — Assessment & Plan Note (Signed)
Do not have recent testing, creatinine 1.8 in 2013.

## 2013-09-27 NOTE — Assessment & Plan Note (Signed)
Status post bilateral CEA, followed by Dr. Kellie Simmering.

## 2013-09-27 NOTE — Assessment & Plan Note (Signed)
Stable claudication, most recent arterial Dopplers noted above.

## 2013-09-27 NOTE — Assessment & Plan Note (Signed)
History of LAD intervention in 1994 with known occlusion of the RCA and prior evidence of inferior ischemia by noninvasive testing. Followup evaluation of ischemic burden to be obtained as noted above.

## 2013-09-28 ENCOUNTER — Ambulatory Visit (HOSPITAL_COMMUNITY)
Admission: RE | Admit: 2013-09-28 | Discharge: 2013-09-28 | Disposition: A | Discharge status: Home / Self Care | Payer: Medicare PPO | Source: Ambulatory Visit | Attending: Cardiology | Admitting: Cardiology

## 2013-09-28 DIAGNOSIS — I10 Essential (primary) hypertension: Secondary | ICD-10-CM | POA: Insufficient documentation

## 2013-09-28 DIAGNOSIS — I509 Heart failure, unspecified: Secondary | ICD-10-CM | POA: Insufficient documentation

## 2013-09-28 DIAGNOSIS — I369 Nonrheumatic tricuspid valve disorder, unspecified: Secondary | ICD-10-CM

## 2013-09-28 DIAGNOSIS — E785 Hyperlipidemia, unspecified: Secondary | ICD-10-CM | POA: Insufficient documentation

## 2013-09-28 DIAGNOSIS — E119 Type 2 diabetes mellitus without complications: Secondary | ICD-10-CM | POA: Insufficient documentation

## 2013-09-28 DIAGNOSIS — Z6831 Body mass index (BMI) 31.0-31.9, adult: Secondary | ICD-10-CM | POA: Insufficient documentation

## 2013-09-28 DIAGNOSIS — R0602 Shortness of breath: Secondary | ICD-10-CM | POA: Insufficient documentation

## 2013-09-28 DIAGNOSIS — I2510 Atherosclerotic heart disease of native coronary artery without angina pectoris: Secondary | ICD-10-CM | POA: Insufficient documentation

## 2013-09-28 NOTE — Addendum Note (Signed)
Addended by: Truett Mainland on: 09/28/2013 02:13 PM   Modules accepted: Orders

## 2013-09-28 NOTE — Progress Notes (Signed)
*  PRELIMINARY RESULTS* Echocardiogram 2D Echocardiogram has been performed.  [place], [place] 09/28/2013, 9:40 AM

## 2013-10-04 ENCOUNTER — Encounter (HOSPITAL_COMMUNITY)
Admission: RE | Admit: 2013-10-04 | Discharge: 2013-10-04 | Disposition: A | Discharge status: Home / Self Care | Payer: Medicare HMO | Source: Ambulatory Visit | Attending: Cardiology | Admitting: Cardiology

## 2013-10-04 ENCOUNTER — Encounter (HOSPITAL_COMMUNITY): Payer: Self-pay

## 2013-10-04 DIAGNOSIS — I2510 Atherosclerotic heart disease of native coronary artery without angina pectoris: Secondary | ICD-10-CM | POA: Insufficient documentation

## 2013-10-04 DIAGNOSIS — R0609 Other forms of dyspnea: Secondary | ICD-10-CM | POA: Insufficient documentation

## 2013-10-04 DIAGNOSIS — R0602 Shortness of breath: Secondary | ICD-10-CM

## 2013-10-04 DIAGNOSIS — E785 Hyperlipidemia, unspecified: Secondary | ICD-10-CM | POA: Insufficient documentation

## 2013-10-04 DIAGNOSIS — I10 Essential (primary) hypertension: Secondary | ICD-10-CM | POA: Insufficient documentation

## 2013-10-04 DIAGNOSIS — R0989 Other specified symptoms and signs involving the circulatory and respiratory systems: Secondary | ICD-10-CM | POA: Insufficient documentation

## 2013-10-04 DIAGNOSIS — I509 Heart failure, unspecified: Secondary | ICD-10-CM

## 2013-10-04 DIAGNOSIS — R110 Nausea: Secondary | ICD-10-CM | POA: Insufficient documentation

## 2013-10-04 DIAGNOSIS — R42 Dizziness and giddiness: Secondary | ICD-10-CM | POA: Insufficient documentation

## 2013-10-04 DIAGNOSIS — E119 Type 2 diabetes mellitus without complications: Secondary | ICD-10-CM | POA: Insufficient documentation

## 2013-10-04 MED ORDER — SODIUM CHLORIDE 0.9 % IJ SOLN
INTRAMUSCULAR | Status: AC
  Administered 2013-10-04: 10 mL via INTRAVENOUS
  Filled 2013-10-04: qty 10

## 2013-10-04 MED ORDER — TECHNETIUM TC 99M SESTAMIBI - CARDIOLITE
30.0000 | Freq: Once | INTRAVENOUS | Status: AC | PRN
  Administered 2013-10-04: 30 via INTRAVENOUS

## 2013-10-04 MED ORDER — REGADENOSON 0.4 MG/5ML IV SOLN
INTRAVENOUS | Status: AC
  Administered 2013-10-04: 0.4 mg via INTRAVENOUS
  Filled 2013-10-04: qty 5

## 2013-10-04 MED ORDER — TECHNETIUM TC 99M SESTAMIBI GENERIC - CARDIOLITE
10.0000 | Freq: Once | INTRAVENOUS | Status: AC | PRN
Start: 2013-10-04 — End: 2013-10-04
  Administered 2013-10-04: 10 via INTRAVENOUS

## 2013-10-04 NOTE — Progress Notes (Signed)
Stress Lab Nurses Notes - [facility] 10/04/2013 Reason for doing test: Dyspnea and CHF Type of test: Wille Glaser Nurse performing test: Gerrit Halls, RN Nuclear Medicine Tech: Melburn Hake Echo Tech: Not Applicable MD performing test: Dr. Clearance Coots.Bonnell Public PA Family MD: Dr. [name] Test explained and consent signed: yes IV started: 22g jelco, Saline lock flushed, No redness or edema and Saline lock started in radiology Symptoms: Dizziness & nausea Treatment/Intervention: None Reason test stopped: protocol completed After recovery IV was: Discontinued via X-ray tech and No redness or edema Patient to return to [facility]. Med at : 11:00 Patient discharged: Home Patient's Condition upon discharge was: stable Comments: During test 82/48 & HR 87.  Recovery BP 128/56 & HR 75.  Symptoms resolved in recovery. Geanie Cooley T

## 2013-10-23 ENCOUNTER — Encounter: Payer: Self-pay | Admitting: Cardiology

## 2013-10-26 ENCOUNTER — Encounter: Payer: Self-pay | Admitting: Cardiology

## 2013-10-26 NOTE — Progress Notes (Signed)
Clinical Summary Stephen [name] is a 73 y.o.male last seen in January 2015. He is here with his wife today, states she feels much better in general. Has been able to cut back his Lasix and his weight has been stable. He reports no chest pain.  Echocardiogram from January 2015 showed mild LVH with LVEF 60-65% with increased filling pressures, mild left atrial enlargement, borderline increased pulmonary pressures. Lexiscan Cardiolite also in January showed probable inferolateral scar without ischemia, LVEF 51% with basal inferior hypokinesis. We reviewed the results today.  Recently followup labwork showed potassium 4.9, BUN 38 and creatinine 2.0 (previously 1.8). He saw Dr. [name] last week.  No Known Allergies  Current Outpatient Prescriptions  Medication Sig Dispense Refill  . amLODipine (NORVASC) 5 MG tablet Take 5 mg by mouth daily.      Marland Kitchen aspirin 81 MG tablet Take 81 mg by mouth daily.      Marland Kitchen atenolol (TENORMIN) 50 MG tablet Take 50 mg by mouth daily.        Marland Kitchen doxazosin (CARDURA) 2 MG tablet Take 2 mg by mouth at bedtime.      . fenofibrate 160 MG tablet Take 160 mg by mouth daily.      . furosemide (LASIX) 20 MG tablet Take 20 mg by mouth daily.      . insulin aspart (NOVOLOG) 100 UNIT/ML injection Inject 25 Units into the skin 2 (two) times daily.       . insulin glargine (LANTUS) 100 UNIT/ML injection Inject 55 Units into the skin at bedtime.       . Lansoprazole (PREVACID PO) Take by mouth as needed.      Marland Kitchen lisinopril (PRINIVIL,ZESTRIL) 20 MG tablet Take 20 mg by mouth daily.        . pravastatin (PRAVACHOL) 40 MG tablet Take 40 mg by mouth daily.       No current facility-administered medications for this visit.    Past Medical History  Diagnosis Date  . Diabetes mellitus type II   . Essential hypertension, benign   . Hyperlipidemia   . Cerebrovascular disease     H/o CVA In 2001 followed by a right carotid endarterectomy; left carotid endarterectomy in 2008  . Coronary  atherosclerosis of native coronary artery     PTCA of LAD in 1994; total obstruction of the RCA treated medically; subsequent stress nuclear study with inferior ischemia  . Peripheral vascular disease   . DDD (degenerative disc disease), lumbar     Lumbar surgery in 1984  . Carotid artery occlusion   . Stroke 2001    Right brain    Social History Stephen [name] reports that he quit smoking about 41 years ago. His smoking use included Cigarettes. He has a 30 pack-year smoking history. He has never used smokeless tobacco. Stephen [name] reports that he does not drink alcohol.  Review of Systems No palpitations, dizziness, syncope. Leg edema much improved. Otherwise negative.  Physical Examination Filed Vitals:   10/27/13 0902  BP: 124/85  Pulse: 80   Filed Weights   10/27/13 0902  Weight: 230 lb (104.327 kg)    Overweight male, no distress.  HEENT: Conjunctiva and lids normal, oropharynx clear.  Neck: Supple, no elevated JVP or carotid bruits, no thyromegaly.  Lungs: Clear to auscultation, nonlabored breathing at rest.  Cardiac: Regular rate and rhythm with ectopy, no obvious S3, soft systolic murmur without diastolic murmur, no pericardial rub.  Abdomen: Soft, nontender, bowel sounds present, no guarding or  rebound.  Extremities: Edema improved, distal pulses 1+..     Problem List and Plan   Chronic diastolic heart failure Continue current regimen, agree with reducing Lasix dose, check daily weights. Keep regular 3 month follow up with Dr. [name].  Coronary atherosclerosis of native coronary artery No active angina symptoms. Recent Cardiolite is consistent with known occluded RCA, importantly showed no active anterior ischemia. Recommend continued medical therapy and observation. He is on aspirin, beta blocker, and statin. He will continue to followup with Dr. [name] regularly, and we will see him back in one year, sooner if needed.  Hyperlipidemia Continues on  Pravachol.  Chronic kidney disease Recent creatinine 2.0.    Satira Sark, M.D., F.A.C.C.

## 2013-10-27 ENCOUNTER — Ambulatory Visit (INDEPENDENT_AMBULATORY_CARE_PROVIDER_SITE_OTHER): Payer: Medicare HMO | Admitting: Cardiology

## 2013-10-27 ENCOUNTER — Encounter: Payer: Self-pay | Admitting: Cardiology

## 2013-10-27 VITALS — BP 124/85 | HR 80 | Ht 73.0 in | Wt 230.0 lb

## 2013-10-27 DIAGNOSIS — E785 Hyperlipidemia, unspecified: Secondary | ICD-10-CM

## 2013-10-27 DIAGNOSIS — I5032 Chronic diastolic (congestive) heart failure: Secondary | ICD-10-CM

## 2013-10-27 DIAGNOSIS — I2510 Atherosclerotic heart disease of native coronary artery without angina pectoris: Secondary | ICD-10-CM

## 2013-10-27 DIAGNOSIS — N189 Chronic kidney disease, unspecified: Secondary | ICD-10-CM

## 2013-10-27 NOTE — Assessment & Plan Note (Signed)
No active angina symptoms. Recent Cardiolite is consistent with known occluded RCA, importantly showed no active anterior ischemia. Recommend continued medical therapy and observation. He is on aspirin, beta blocker, and statin. He will continue to followup with Dr. [name] regularly, and we will see him back in one year, sooner if needed.

## 2013-10-27 NOTE — Assessment & Plan Note (Signed)
Recent creatinine 2.0.

## 2013-10-27 NOTE — Assessment & Plan Note (Signed)
Continues on Pravachol. 

## 2013-10-27 NOTE — Patient Instructions (Addendum)
Your physician wants you to follow-up in:  1 year You will receive a reminder letter in the mail two months in advance. If you don't receive a letter, please call our office to schedule the follow-up appointment.    Your physician recommends that you continue on your current medications as directed. Please refer to the Current Medication list given to you today.     Thanks for choosing [facility] ([facility] !

## 2013-10-27 NOTE — Assessment & Plan Note (Signed)
Continue current regimen, agree with reducing Lasix dose, check daily weights. Keep regular 3 month follow up with Dr. [name].

## 2014-07-11 ENCOUNTER — Encounter: Payer: Self-pay | Admitting: Family

## 2014-07-12 ENCOUNTER — Other Ambulatory Visit: Payer: Self-pay | Admitting: Vascular Surgery

## 2014-07-12 ENCOUNTER — Encounter: Payer: Self-pay | Admitting: Family

## 2014-07-12 ENCOUNTER — Ambulatory Visit (INDEPENDENT_AMBULATORY_CARE_PROVIDER_SITE_OTHER): Payer: Commercial Managed Care - HMO | Admitting: Family

## 2014-07-12 ENCOUNTER — Ambulatory Visit (INDEPENDENT_AMBULATORY_CARE_PROVIDER_SITE_OTHER)
Admission: RE | Admit: 2014-07-12 | Discharge: 2014-07-12 | Disposition: A | Discharge status: Home / Self Care | Payer: Medicare HMO | Source: Ambulatory Visit | Attending: Family | Admitting: Family

## 2014-07-12 ENCOUNTER — Ambulatory Visit (HOSPITAL_COMMUNITY)
Admission: RE | Admit: 2014-07-12 | Discharge: 2014-07-12 | Disposition: A | Discharge status: Home / Self Care | Payer: Medicare HMO | Source: Ambulatory Visit | Attending: Vascular Surgery | Admitting: Vascular Surgery

## 2014-07-12 VITALS — BP 136/60 | HR 56 | Resp 14 | Ht 72.0 in | Wt 234.0 lb

## 2014-07-12 DIAGNOSIS — Z48812 Encounter for surgical aftercare following surgery on the circulatory system: Secondary | ICD-10-CM

## 2014-07-12 DIAGNOSIS — I739 Peripheral vascular disease, unspecified: Secondary | ICD-10-CM

## 2014-07-12 DIAGNOSIS — I6523 Occlusion and stenosis of bilateral carotid arteries: Secondary | ICD-10-CM | POA: Diagnosis not present

## 2014-07-12 DIAGNOSIS — I6529 Occlusion and stenosis of unspecified carotid artery: Secondary | ICD-10-CM | POA: Insufficient documentation

## 2014-07-12 NOTE — Patient Instructions (Signed)

## 2014-07-12 NOTE — Progress Notes (Signed)
[facility] [place] HISTORY AND PHYSICAL   MRN : [id number]  History of Present Illness:   Stephen [name] is a 73 y.o. male patient of Dr. Kellie Simmering who is s/p Bilateral CEA in 2001 (right) and 2008 (left). He returns today for follow up.  He has IDDM, states in control, and remote history of smoking.  Patient had  Positive history of TIA or stroke symptom with LUE weakness and left facial droop. He has residual facial droop and fine motor diff on left. He states the left side otherwise is normal. The patient denies amaurosis fugax or monocular blindness. The patient reports facial drooping. He has had no stroke or TIA activity in the last several years.  Pt. denies hemiplegia. The patient denies receptive or expressive aphasia.  The patient's previous neurologic deficits are Unchanged.  The patient states he has chronic mild dyspnea which is no worse, denies chest pain, denies light headedness, is not aware of any history of irregular heart rhythm.  2. PAD F/U  Pt states he claudicates at 1 block with cramping in both legs , relieved with rest, but if he is holding on to a cart, he can walk at least 30 minutes. This is unchanged from a year ago. He denies night or rest pain and has had no non-healing ulcers  New Medical or Surgical History: none  Pt meds include:  Statin : Yes  Betablocker: Yes  ASA: Yes  Other anticoagulants/antiplatelets: none    Current Outpatient Prescriptions  Medication Sig Dispense Refill  . amLODipine (NORVASC) 5 MG tablet Take 5 mg by mouth daily.      Marland Kitchen aspirin 81 MG tablet Take 81 mg by mouth daily.      Marland Kitchen atenolol (TENORMIN) 50 MG tablet Take 50 mg by mouth daily.        . fenofibrate 160 MG tablet Take 160 mg by mouth daily.      . furosemide (LASIX) 20 MG tablet Take 20 mg by mouth daily.      . insulin aspart (NOVOLOG) 100 UNIT/ML injection Inject 25 Units into the skin 2 (two) times daily.       . insulin glargine (LANTUS) 100  UNIT/ML injection Inject 55 Units into the skin at bedtime.       . Lansoprazole (PREVACID PO) Take by mouth as needed.      Marland Kitchen lisinopril (PRINIVIL,ZESTRIL) 20 MG tablet Take 20 mg by mouth daily.        . pravastatin (PRAVACHOL) 40 MG tablet Take 40 mg by mouth daily.      Marland Kitchen doxazosin (CARDURA) 2 MG tablet Take 2 mg by mouth at bedtime.       No current facility-administered medications for this visit.    Past Medical History  Diagnosis Date  . Diabetes mellitus type II   . Essential hypertension, benign   . Hyperlipidemia   . Cerebrovascular disease     H/o CVA In 2001 followed by a right carotid endarterectomy; left carotid endarterectomy in 2008  . Coronary atherosclerosis of native coronary artery     PTCA of LAD in 1994; total obstruction of the RCA treated medically; subsequent stress nuclear study with inferior ischemia  . Peripheral vascular disease   . DDD (degenerative disc disease), lumbar     Lumbar surgery in 1984  . Carotid artery occlusion   . Stroke 2001    Right brain    Social History History  Substance Use Topics  .  Smoking status: Former Smoker -- 3.00 packs/day for 10 years    Types: Cigarettes    Quit date: 09/21/1972  . Smokeless tobacco: Never Used  . Alcohol Use: No    Family History Family History  Problem Relation Age of Onset  . Heart disease Mother     Before age 50  . Diabetes Mother   . Heart attack Mother   . Diabetes Sister   . Heart disease Brother   . Diabetes Brother   . Diabetes Daughter   . Diabetes Son   . Hyperlipidemia Son     Surgical History Past Surgical History  Procedure Laterality Date  . Lumbar disc surgery  2000, 2001    L5 HNP required 2 surgical procedures  . Carotid endarterectomy  2008    Left   . Carotid endarterectomy  06/15/2000    Right  . Appendectomy  1970    No Known Allergies  Current Outpatient Prescriptions  Medication Sig Dispense Refill  . amLODipine (NORVASC) 5 MG tablet Take 5 mg by  mouth daily.      Marland Kitchen aspirin 81 MG tablet Take 81 mg by mouth daily.      Marland Kitchen atenolol (TENORMIN) 50 MG tablet Take 50 mg by mouth daily.        . fenofibrate 160 MG tablet Take 160 mg by mouth daily.      . furosemide (LASIX) 20 MG tablet Take 20 mg by mouth daily.      . insulin aspart (NOVOLOG) 100 UNIT/ML injection Inject 25 Units into the skin 2 (two) times daily.       . insulin glargine (LANTUS) 100 UNIT/ML injection Inject 55 Units into the skin at bedtime.       . Lansoprazole (PREVACID PO) Take by mouth as needed.      Marland Kitchen lisinopril (PRINIVIL,ZESTRIL) 20 MG tablet Take 20 mg by mouth daily.        . pravastatin (PRAVACHOL) 40 MG tablet Take 40 mg by mouth daily.      Marland Kitchen doxazosin (CARDURA) 2 MG tablet Take 2 mg by mouth at bedtime.       No current facility-administered medications for this visit.     REVIEW OF SYSTEMS: See HPI for pertinent positives and negatives.  Physical Examination Filed Vitals:   07/12/14 1426 07/12/14 1434  BP: 127/60 136/60  Pulse: 40 56  Resp:  14  Height:  6' (1.829 m)  Weight:  234 lb (106.142 kg)  SpO2:  98%   Body mass index is 31.73 kg/(m^2).  General:  WDWN in NAD Gait: Normal HENT: WNL Eyes: Pupils equal Pulmonary: normal non-labored breathing , without Rales, rhonchi,  Wheezing, diminished air movement oent. Cardiac: Regularly irregualr in a trigeminal fashion  Abdomen: soft, NT, no masses palpated, large panus Skin: no rashes, ulcers noted;  no Gangrene , no cellulitis; no open wounds.   VASCULAR EXAM  Carotid Bruits Right Left   Positive Positive   Radial pulses are 2+ palpable and =                    VASCULAR EXAM: Extremities without ischemic changes  without Gangrene; without open wounds.  LE Pulses Right Left       POPLITEAL  not palpable   not palpable       POSTERIOR TIBIAL  not palpable   not palpable         DORSALIS PEDIS      ANTERIOR TIBIAL not palpable  not palpable     Musculoskeletal: no muscle wasting or atrophy; 1+ bilateral peripheral pitting edema  Neurologic: A&O X 3; Appropriate Affect ;  SENSATION: normal; MOTOR FUNCTION: 5/5 Symmetric, CN 2-12 intact Speech is fluent/normal    Non-Invasive Vascular Imaging (07/12/2014):  CEREBROVASCULAR DUPLEX EVALUATION    INDICATION: Carotid artery stenosis    PREVIOUS INTERVENTION(S): Right carotid endarterectomy 2001; Left carotid endarterectomy 07/20/2007    DUPLEX EXAM:     RIGHT  LEFT  Peak Systolic Velocities (cm/s) End Diastolic Velocities (cm/s) Plaque LOCATION Peak Systolic Velocities (cm/s) End Diastolic Velocities (cm/s) Plaque  133 18  CCA PROXIMAL 141 17   90 14  CCA MID 122 20   68/143 12/30 CP CCA DISTAL 133 19   458 39 CP ECA 118 8   143 21 CP ICA PROXIMAL 92 16   104 22  ICA MID 90 23   106 24  ICA DISTAL 78 22     Carotid endarterectomy ICA / CCA Ratio (PSV) Carotid endarterectomy  Antegrade Vertebral Flow Antegrade  175 Brachial Systolic Pressure (mmHg) 102  Triphasic Brachial Artery Waveforms Triphasic    Plaque Morphology:  HM = Homogeneous, HT = Heterogeneous, CP = Calcific Plaque, SP = Smooth Plaque, IP = Irregular Plaque     ADDITIONAL FINDINGS:     IMPRESSION: Right distal common carotid artery stenosis present of at least 50% at the proximal patch with history of carotid endarterectomy. Unable to well visualize proximal right internal carotid artery flow due to acoustic shadow/calcific plaque. Left internal carotid artery is patent with history of carotid endarterectomy. Right external carotid artery stenosis present.    Compared to the previous exam:  Increase in disease on the right and stable on the left since study on 07/04/2013.   ABI (Date: 07/12/2014)  R: 0.71 (0.77, 07/04/13),monophasic toe pressure 84  L: 0.58 (0.66)  Monophasic, toe pressure: 73   ASSESSMENT:  Stephen [name] is  a 73 y.o. male who is s/p Bilateral CEA in 2001 (right) and 2008 (left). He has had no stroke or TIA activity in the last several years.  Today's carotid Duplex: right distal common carotid artery stenosis present of at least 50% at the proximal patch with history of carotid endarterectomy. Unable to well visualize proximal right internal carotid artery flow due to acoustic shadow/calcific plaque. Left internal carotid artery is patent with history of carotid endarterectomy. Right external carotid artery stenosis present. Increase in disease on the right and stable on the left since study on 07/04/2013.  He claudicates at 1 block with cramping in both legs , relieved with rest, but if he is holding on to a cart he can walk at least 30 minutes. This is unchanged from a year ago. He denies night or rest pain and has had no non-healing ulcers.  ABI's are slightly worse in the last year, he does not walk much due to claudication pain.  Patient advised to call Dr. Leverne Humbles office today or tomorrow, let his office know that an asymptomatic irregular heart rhythm was noted on auscultation today.  PLAN:   Graduated walking program.  Based on today's exam and non-invasive vascular lab results, and after  discussing with Dr. Oneida Alar, the patient will follow up with Dr. Kellie Simmering in 1-2 weeks and Dr. Kellie Simmering will decide whether CT angio or arteriogram of neck is necessary.  I discussed in depth with the patient the nature of atherosclerosis, and emphasized the importance of maximal medical management including strict control of blood pressure, blood glucose, and lipid levels, obtaining regular exercise, and cessation of smoking.  The patient is aware that without maximal medical management the underlying atherosclerotic disease process will progress, limiting the benefit of any interventions.  The patient was given information about stroke prevention and what symptoms should prompt the patient to seek immediate  medical care.  The patient was given information about PAD including signs, symptoms, treatment, what symptoms should prompt the patient to seek immediate medical care, and risk reduction measures to take. Thank you for allowing [place] to participate in this patient's care.  Clemon Chambers, RN, MSN, FNP-C [facility] Office: [phone]  Clinic MD: Oneida Alar on call 07/12/2014 2:39 PM

## 2014-07-16 ENCOUNTER — Encounter: Payer: Self-pay | Admitting: Vascular Surgery

## 2014-07-17 ENCOUNTER — Ambulatory Visit (INDEPENDENT_AMBULATORY_CARE_PROVIDER_SITE_OTHER): Payer: Commercial Managed Care - HMO | Admitting: Vascular Surgery

## 2014-07-17 ENCOUNTER — Encounter: Payer: Self-pay | Admitting: Vascular Surgery

## 2014-07-17 VITALS — BP 148/48 | HR 80 | Ht 72.0 in | Wt 236.0 lb

## 2014-07-17 DIAGNOSIS — I6523 Occlusion and stenosis of bilateral carotid arteries: Secondary | ICD-10-CM

## 2014-07-17 DIAGNOSIS — I70219 Atherosclerosis of native arteries of extremities with intermittent claudication, unspecified extremity: Secondary | ICD-10-CM

## 2014-07-17 DIAGNOSIS — I739 Peripheral vascular disease, unspecified: Secondary | ICD-10-CM

## 2014-07-17 NOTE — Progress Notes (Signed)
Subjective:     Patient ID: Stephen [name], male   DOB: November 11, 1940, 73 y.o.   MRN: [id number]  HPI this 73 year old male returns today to discuss carotid duplex findings from last week. Patient saw Stephen [name] one week ago for routine follow-up. Patient had right carotid endarterectomy in 2001 and left carotid endarterectomy in 2008 by me. He has had no neurologic symptoms such as lateralizing weakness, aphasia, amaurosis fugax, diplopia, blurred vision, or syncope. He does have stable claudication symptoms walking about 50 yards before developing bilateral calf weakness and discomfort. He states he is not limited by this. He denies any rest pain or history of nonhealing ulcers or infection. He takes 1 daily aspirin.  Past Medical History  Diagnosis Date  . Diabetes mellitus type II   . Essential hypertension, benign   . Hyperlipidemia   . Cerebrovascular disease     H/o CVA In 2001 followed by a right carotid endarterectomy; left carotid endarterectomy in 2008  . Coronary atherosclerosis of native coronary artery     PTCA of LAD in 1994; total obstruction of the RCA treated medically; subsequent stress nuclear study with inferior ischemia  . Peripheral vascular disease   . DDD (degenerative disc disease), lumbar     Lumbar surgery in 1984  . Carotid artery occlusion   . Stroke 2001    Right brain    History  Substance Use Topics  . Smoking status: Former Smoker -- 3.00 packs/day for 10 years    Types: Cigarettes    Quit date: 09/21/1972  . Smokeless tobacco: Never Used  . Alcohol Use: No    Family History  Problem Relation Age of Onset  . Heart disease Mother     Before age 21  . Diabetes Mother   . Heart attack Mother   . Diabetes Sister   . Heart disease Brother   . Diabetes Brother   . Diabetes Daughter   . Diabetes Son   . Hyperlipidemia Son     No Known Allergies  Current outpatient prescriptions:amLODipine (NORVASC) 5 MG tablet, Take 5 mg by mouth daily.,  Disp: , Rfl: ;  aspirin 81 MG tablet, Take 81 mg by mouth daily., Disp: , Rfl: ;  atenolol (TENORMIN) 50 MG tablet, Take 50 mg by mouth daily.  , Disp: , Rfl: ;  fenofibrate 160 MG tablet, Take 160 mg by mouth daily., Disp: , Rfl: ;  furosemide (LASIX) 20 MG tablet, Take 20 mg by mouth as needed. , Disp: , Rfl:  insulin aspart (NOVOLOG) 100 UNIT/ML injection, Inject 25 Units into the skin 2 (two) times daily. , Disp: , Rfl: ;  insulin glargine (LANTUS) 100 UNIT/ML injection, Inject 55 Units into the skin at bedtime. , Disp: , Rfl: ;  Lansoprazole (PREVACID PO), Take by mouth as needed., Disp: , Rfl: ;  lisinopril (PRINIVIL,ZESTRIL) 20 MG tablet, Take 20 mg by mouth daily.  , Disp: , Rfl:  pravastatin (PRAVACHOL) 40 MG tablet, Take 40 mg by mouth daily., Disp: , Rfl: ;  doxazosin (CARDURA) 2 MG tablet, Take 2 mg by mouth at bedtime., Disp: , Rfl:   BP 148/48  Pulse 80  Ht 6' (1.829 m)  Wt 236 lb (107.049 kg)  BMI 32.00 kg/m2  SpO2 99%  Body mass index is 32 kg/(m^2).           Review of Systems denies chest pain    Objective:   Physical Exam BP 148/48  Pulse 80  Ht 6' (  1.829 m)  Wt 236 lb (107.049 kg)  BMI 32.00 kg/m2  SpO2 99%  General well-developed well-nourished male in no apparent distress alert and going 3 Carotid bruits right lower than left. 3+ carotid pulses palpable. Neurologic exam normal  Patient has ABI of 0.71 on the right and 0.58 on the left which is stable Carotid duplex exam reviewed. There is an approximate 50% recurrent stenosis in the proximal patch in the right ICA. There is an approximate 40-50% stenosis in the endarterectomized segment on the left side-ICA      Assessment:     #1 recurrent bilateral carotid stenosis right greater than left but not exceeding 50-60% and asymptomatic #2 stable claudication bilaterally presumably due to femoral-popliteal occlusive disease    Plan:     Return in 9 months with follow-up carotid duplex exam and see  me Repeat ABIs and carotid duplex exam on return Continue aspirin

## 2014-07-17 NOTE — Addendum Note (Signed)
Addended by: Mena Goes on: 07/17/2014 03:25 PM   Modules accepted: Orders

## 2014-07-19 ENCOUNTER — Encounter: Payer: Self-pay | Admitting: Cardiology

## 2014-07-19 ENCOUNTER — Ambulatory Visit (INDEPENDENT_AMBULATORY_CARE_PROVIDER_SITE_OTHER): Payer: Commercial Managed Care - HMO | Admitting: Cardiology

## 2014-07-19 VITALS — BP 112/66 | HR 80 | Ht 72.0 in | Wt 232.0 lb

## 2014-07-19 DIAGNOSIS — I4891 Unspecified atrial fibrillation: Secondary | ICD-10-CM

## 2014-07-19 DIAGNOSIS — I2510 Atherosclerotic heart disease of native coronary artery without angina pectoris: Secondary | ICD-10-CM

## 2014-07-19 DIAGNOSIS — I499 Cardiac arrhythmia, unspecified: Secondary | ICD-10-CM

## 2014-07-19 NOTE — Progress Notes (Signed)
Reason for visit: CAD, irregular heartbeat  Clinical Summary Mr. Loughry is a 73 y.o.male last seen in February. Recent interval follow-up with [facility] noted in October. Patient reportedly noted to have an irregular heart beat on that occasion, and there was concern that he might be in atrial fibrillation. Referred to see [place] for evaluation.  He presents unaware of any symptoms of palpitations, no dizziness, no angina. ECG today shows sinus rhythm with frequent PACs, not atrial fibrillation. I discussed this with him.  Echocardiogram from January 2015 showed mild LVH with LVEF 60-65% with increased filling pressures, mild left atrial enlargement, borderline increased pulmonary pressures. Lexiscan Cardiolite also in January showed probable inferolateral scar without ischemia, LVEF 51% with basal inferior hypokinesis.   No Known Allergies  Current Outpatient Prescriptions  Medication Sig Dispense Refill  . amLODipine (NORVASC) 5 MG tablet Take 5 mg by mouth daily.      Marland Kitchen aspirin 81 MG tablet Take 81 mg by mouth daily.      Marland Kitchen atenolol (TENORMIN) 50 MG tablet Take 50 mg by mouth daily.        Marland Kitchen doxazosin (CARDURA) 2 MG tablet Take 2 mg by mouth at bedtime.      . fenofibrate 160 MG tablet Take 160 mg by mouth daily.      . furosemide (LASIX) 20 MG tablet Take 20 mg by mouth as needed.       . insulin aspart (NOVOLOG) 100 UNIT/ML injection Inject 25 Units into the skin 2 (two) times daily.       . insulin glargine (LANTUS) 100 UNIT/ML injection Inject 55 Units into the skin at bedtime.       . Lansoprazole (PREVACID PO) Take by mouth as needed.      Marland Kitchen lisinopril (PRINIVIL,ZESTRIL) 20 MG tablet Take 20 mg by mouth daily.        . pravastatin (PRAVACHOL) 40 MG tablet Take 40 mg by mouth daily.       No current facility-administered medications for this visit.    Past Medical History  Diagnosis Date  . Diabetes mellitus type II   . Essential hypertension, benign   . Hyperlipidemia   .  Cerebrovascular disease     H/o CVA In 2001 followed by a right carotid endarterectomy; left carotid endarterectomy in 2008  . Coronary atherosclerosis of native coronary artery     PTCA of LAD in 1994; total obstruction of the RCA treated medically; subsequent stress nuclear study with inferior ischemia  . Peripheral vascular disease   . DDD (degenerative disc disease), lumbar     Lumbar surgery in 1984  . Carotid artery occlusion   . Stroke 2001    Right brain    Past Surgical History  Procedure Laterality Date  . Lumbar disc surgery  2000, 2001    L5 HNP required 2 surgical procedures  . Carotid endarterectomy  2008    Left   . Carotid endarterectomy  06/15/2000    Right  . Appendectomy  1970    Family History  Problem Relation Age of Onset  . Heart disease Mother     Before age 5  . Diabetes Mother   . Heart attack Mother   . Diabetes Sister   . Heart disease Brother   . Diabetes Brother   . Diabetes Daughter   . Diabetes Son   . Hyperlipidemia Son     Social History Mr. Naumann reports that he quit smoking about 41 years ago. His  smoking use included Cigarettes. He has a 30 pack-year smoking history. He has never used smokeless tobacco. Mr. Glorioso reports that he does not drink alcohol.  Review of Systems Complete review of systems negative except as otherwise outlined in the clinical summary and also the following. Stable claudication.  Physical Examination Filed Vitals:   07/19/14 1050  BP: 112/66  Pulse: 80   Filed Weights   07/19/14 1050  Weight: 232 lb (105.235 kg)    Overweight male, no distress.  HEENT: Conjunctiva and lids normal, oropharynx clear.  Neck: Supple, no elevated JVP or carotid bruits, no thyromegaly.  Lungs: Clear to auscultation, nonlabored breathing at rest.  Cardiac: Regular rate and rhythm with ectopy, no obvious S3, soft systolic murmur without diastolic murmur, no pericardial rub.  Abdomen: Soft, nontender, bowel sounds  present, no guarding or rebound.  Extremities: Edema improved, distal pulses 1+..    Problem List and Plan   Irregular heartbeat Patient is in sinus rhythm with PACs by ECG, not atrial fibrillation. He is unaware of any palpitations. Plan to continue beta blocker therapy and observation.  Coronary atherosclerosis of native coronary artery No active angina symptoms, Cardiolite study from January was overall low risk showing inferolateral scar without ischemia.    Satira Sark, M.D., F.A.C.C.

## 2014-07-19 NOTE — Assessment & Plan Note (Signed)
Patient is in sinus rhythm with PACs by ECG, not atrial fibrillation. He is unaware of any palpitations. Plan to continue beta blocker therapy and observation.

## 2014-07-19 NOTE — Patient Instructions (Signed)
Your physician wants you to follow-up in: 6 months You will receive a reminder letter in the mail two months in advance. If you don't receive a letter, please call our office to schedule the follow-up appointment.     Your physician recommends that you continue on your current medications as directed. Please refer to the Current Medication list given to you today.      Thank you for choosing [facility] !        

## 2014-07-19 NOTE — Assessment & Plan Note (Signed)
No active angina symptoms, Cardiolite study from January was overall low risk showing inferolateral scar without ischemia.

## 2014-11-20 ENCOUNTER — Ambulatory Visit (INDEPENDENT_AMBULATORY_CARE_PROVIDER_SITE_OTHER): Payer: PPO | Admitting: Cardiology

## 2014-11-20 ENCOUNTER — Encounter: Payer: Self-pay | Admitting: Cardiology

## 2014-11-20 VITALS — BP 138/70 | HR 54 | Ht 72.0 in | Wt 236.2 lb

## 2014-11-20 DIAGNOSIS — E782 Mixed hyperlipidemia: Secondary | ICD-10-CM

## 2014-11-20 DIAGNOSIS — I739 Peripheral vascular disease, unspecified: Secondary | ICD-10-CM

## 2014-11-20 DIAGNOSIS — I10 Essential (primary) hypertension: Secondary | ICD-10-CM

## 2014-11-20 DIAGNOSIS — I2510 Atherosclerotic heart disease of native coronary artery without angina pectoris: Secondary | ICD-10-CM

## 2014-11-20 NOTE — Progress Notes (Signed)
Cardiology Office Note  Date: 11/20/2014   ID: Stephen [name], DOB 03/10/1941, MRN [id number]  PCP: Stephen Noble, MD  Primary Cardiologist: Stephen Lesches, MD   Chief Complaint  Patient presents with  . Coronary Artery Disease  . Carotid artery disease  . Hyperlipidemia    History of Present Illness: Stephen [name] is a 74 y.o. male last seen in October 2015. He presents for a routine visit today. Reports no angina symptoms, NYHA class II dyspnea. 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 He is limited by leg pain and arthritic pain in his back, does not do much walking for exercise. We reviewed his medications which are outlined below.  He underwent ischemic and structural cardiac workup last year, results reviewed below.  He continues to follow with Stephen [name], remains on Pravachol for management of his lipids.  Follow-up for PAD noted with Stephen [name] in October 2015. He has ABIs of 0.71 on the right and 0.58 on the left with known femoral-popliteal occlusive disease. Carotid Dopplers showed approximately 50% stenosis in the right ICA and 40-50% stenosis in the left ICA.   Past Medical History  Diagnosis Date  . Diabetes mellitus type II   . Essential hypertension, benign   . Hyperlipidemia   . Cerebrovascular disease     H/o CVA In 2001 followed by a right carotid endarterectomy; left carotid endarterectomy in 2008  . Coronary atherosclerosis of native coronary artery     PTCA of LAD in 1994; total obstruction of the RCA treated medically; subsequent stress nuclear study with inferior ischemia  . Peripheral vascular disease   . DDD (degenerative disc disease), lumbar     Lumbar surgery in 1984  . Carotid artery occlusion   . Stroke 2001    Right brain    Past Surgical History  Procedure Laterality Date  . Lumbar disc surgery  2000, 2001    L5 HNP required 2 surgical procedures  . Carotid endarterectomy  2008    Left   . Carotid endarterectomy   06/15/2000    Right  . Appendectomy  1970    Current Outpatient Prescriptions  Medication Sig Dispense Refill  . amLODipine (NORVASC) 5 MG tablet Take 5 mg by mouth daily.    Marland Kitchen aspirin 81 MG tablet Take 81 mg by mouth daily.    Marland Kitchen atenolol (TENORMIN) 50 MG tablet Take 50 mg by mouth daily.      Marland Kitchen doxazosin (CARDURA) 2 MG tablet Take 2 mg by mouth at bedtime.    . fenofibrate 160 MG tablet Take 160 mg by mouth daily.    . insulin aspart (NOVOLOG) 100 UNIT/ML injection Inject 25 Units into the skin 2 (two) times daily.     . insulin glargine (LANTUS) 100 UNIT/ML injection Inject 55 Units into the skin at bedtime.     . Lansoprazole (PREVACID PO) Take by mouth as needed.    . pravastatin (PRAVACHOL) 40 MG tablet Take 40 mg by mouth daily.     No current facility-administered medications for this visit.    Allergies:  Review of patient's allergies indicates no known allergies.   Social History: The patient  reports that he quit smoking about 42 years ago. His smoking use included Cigarettes. He has a 30 pack-year smoking history. He has never used smokeless tobacco. He reports that he does not drink alcohol or use illicit drugs.     ROS:  Please see the history of present illness.  Otherwise, complete review of systems is positive for claudication, lower back pain. No palpitations or syncope.  All other systems are reviewed and negative.    Physical Exam: VS:  BP 138/70 mmHg  Pulse 54  Ht 6' (1.829 m)  Wt 236 lb 3.2 oz (107.14 kg)  BMI 32.03 kg/m2  SpO2 96%, BMI Body mass index is 32.03 kg/(m^2).  Wt Readings from Last 3 Encounters:  11/20/14 236 lb 3.2 oz (107.14 kg)  07/19/14 232 lb (105.235 kg)  07/17/14 236 lb (107.049 kg)     Overweight male, no distress.  HEENT: Conjunctiva and lids normal, oropharynx clear.  Neck: Supple, no elevated JVP or carotid bruits, no thyromegaly.  Lungs: Clear to auscultation, nonlabored breathing at rest.  Cardiac: Regular rate and rhythm  with ectopy, no obvious S3, soft systolic murmur without diastolic murmur, no pericardial rub.  Abdomen: Soft, nontender, bowel sounds present, no guarding or rebound.  Extremities: Edema improved, distal pulses diminished.. Skin: Warm and dry. Musculoskeletal: No kyphosis. Neuropsychiatric: Alert and oriented 3, affect appropriate.   ECG: ECG is not ordered today.   Other Studies Reviewed Today:  1. Echocardiogram from January 2015 showed mild LVH with LVEF 60-65% with increased filling pressures, mild left atrial enlargement, borderline increased pulmonary pressures.   2. Lexiscan Cardiolite January 2015 showed probable inferolateral scar without ischemia, LVEF 51% with basal inferior hypokinesis.  ASSESSMENT AND PLAN:  1. CAD status post remote angioplasty to the LAD with occluded RCA, symptomatically stable and managed medically. Follow-up Cardiolite from last year showed inferolateral scar without ischemia.  2. Peripheral arterial disease including carotids and legs as outlined above. He has stable claudication symptoms. Keep follow-up with Stephen [name].  3. Hyperlipidemia, on Pravachol. We will request most recent lab work from Stephen [name].  4. Essential hypertension, no changes made to current regimen.  5. Irregular heartbeat, documented sinus rhythm with PACs by ECG.  Current medicines are reviewed at length with the patient today.  The patient does not have concerns regarding medicines.  Disposition: FU with me in 1 year.   Signed, Satira Sark, MD, [facility] 11/20/2014 1:10 PM    [place] [place]. [address], [place], [place] [address] Phone: [phone]; Fax: [phone]

## 2014-11-20 NOTE — Patient Instructions (Signed)
Your physician wants you to follow-up in: 1 year with Dr McDowell You will receive a reminder letter in the mail two months in advance. If you don't receive a letter, please call our office to schedule the follow-up appointment.   Your physician recommends that you continue on your current medications as directed. Please refer to the Current Medication list given to you today.     Thank you for choosing [facility] !        

## 2015-04-11 ENCOUNTER — Encounter: Payer: Self-pay | Admitting: Vascular Surgery

## 2015-04-16 ENCOUNTER — Encounter (HOSPITAL_COMMUNITY): Payer: Commercial Managed Care - HMO

## 2015-04-16 ENCOUNTER — Other Ambulatory Visit (HOSPITAL_COMMUNITY): Payer: Commercial Managed Care - HMO

## 2015-04-16 ENCOUNTER — Ambulatory Visit (HOSPITAL_COMMUNITY)
Admission: RE | Admit: 2015-04-16 | Discharge: 2015-04-16 | Disposition: A | Discharge status: Home / Self Care | Payer: PPO | Source: Ambulatory Visit | Attending: Vascular Surgery | Admitting: Vascular Surgery

## 2015-04-16 ENCOUNTER — Other Ambulatory Visit: Payer: Self-pay | Admitting: Vascular Surgery

## 2015-04-16 ENCOUNTER — Encounter: Payer: Self-pay | Admitting: Vascular Surgery

## 2015-04-16 ENCOUNTER — Ambulatory Visit (INDEPENDENT_AMBULATORY_CARE_PROVIDER_SITE_OTHER): Payer: PPO | Admitting: Vascular Surgery

## 2015-04-16 ENCOUNTER — Ambulatory Visit (INDEPENDENT_AMBULATORY_CARE_PROVIDER_SITE_OTHER)
Admission: RE | Admit: 2015-04-16 | Discharge: 2015-04-16 | Disposition: A | Discharge status: Home / Self Care | Payer: PPO | Source: Ambulatory Visit | Attending: Vascular Surgery | Admitting: Vascular Surgery

## 2015-04-16 ENCOUNTER — Ambulatory Visit: Payer: Commercial Managed Care - HMO | Admitting: Vascular Surgery

## 2015-04-16 VITALS — BP 157/81 | HR 70 | Temp 97.8°F | Resp 16 | Ht 72.0 in | Wt 238.0 lb

## 2015-04-16 DIAGNOSIS — I70219 Atherosclerosis of native arteries of extremities with intermittent claudication, unspecified extremity: Secondary | ICD-10-CM

## 2015-04-16 DIAGNOSIS — Z48812 Encounter for surgical aftercare following surgery on the circulatory system: Secondary | ICD-10-CM | POA: Diagnosis not present

## 2015-04-16 DIAGNOSIS — I6523 Occlusion and stenosis of bilateral carotid arteries: Secondary | ICD-10-CM | POA: Insufficient documentation

## 2015-04-16 DIAGNOSIS — I739 Peripheral vascular disease, unspecified: Secondary | ICD-10-CM

## 2015-04-16 NOTE — Progress Notes (Signed)
Subjective:     Patient ID: Stephen [name], male   DOB: [birth year], 74 y.o.   MRN: [id number]  HPI This 74 year old male turns for continued follow-up regarding his carotid occlusive disease and lower extremity occlusive disease. He underwent right carotid endarterectomy by me in 2001 and left carotid endarterectomy in 2008. He had a stroke prior to his right carotid surgery. He has been stable from of neurologic standpoint since that time. He has had some moderate restenosis of both carotid endarterectomy sites which has not been bothersome. He also has stable claudication symptoms which limit him to walking about one half block. Left leg is worse than right. He denies any rest pain or history of nonhealing ulcers infection gangrene. When he stops walking he may be able to resume his walking after 2-3 minutes of rest. It is limiting him to some degree but not severely he states.  Past Medical History  Diagnosis Date  . Diabetes mellitus type II   . Essential hypertension, benign   . Hyperlipidemia   . Cerebrovascular disease     H/o CVA In 2001 followed by a right carotid endarterectomy; left carotid endarterectomy in 2008  . Coronary atherosclerosis of native coronary artery     PTCA of LAD in 1994; total obstruction of the RCA treated medically; subsequent stress nuclear study with inferior ischemia  . Peripheral vascular disease   . DDD (degenerative disc disease), lumbar     Lumbar surgery in 1984  . Carotid artery occlusion   . Stroke 2001    Right brain    History  Substance Use Topics  . Smoking status: Former Smoker -- 3.00 packs/day for 10 years    Types: Cigarettes    Quit date: 09/21/1972  . Smokeless tobacco: Never Used  . Alcohol Use: No    Family History  Problem Relation Age of Onset  . Heart disease Mother     Before age 55  . Diabetes Mother   . Heart attack Mother   . Diabetes Sister   . Heart disease Brother   . Diabetes Brother   . Diabetes Daughter   .  Diabetes Son   . Hyperlipidemia Son     No Known Allergies   Current outpatient prescriptions:  .  amLODipine (NORVASC) 5 MG tablet, Take 5 mg by mouth daily., Disp: , Rfl:  .  aspirin 81 MG tablet, Take 81 mg by mouth daily., Disp: , Rfl:  .  atenolol (TENORMIN) 50 MG tablet, Take 50 mg by mouth daily.  , Disp: , Rfl:  .  doxazosin (CARDURA) 2 MG tablet, Take 2 mg by mouth at bedtime., Disp: , Rfl:  .  fenofibrate 160 MG tablet, Take 160 mg by mouth daily., Disp: , Rfl:  .  insulin aspart (NOVOLOG) 100 UNIT/ML injection, Inject 25 Units into the skin 2 (two) times daily. , Disp: , Rfl:  .  insulin glargine (LANTUS) 100 UNIT/ML injection, Inject 55 Units into the skin at bedtime. , Disp: , Rfl:  .  Lansoprazole (PREVACID PO), Take by mouth as needed., Disp: , Rfl:  .  losartan (COZAAR) 25 MG tablet, Take 25 mg by mouth daily., Disp: , Rfl:  .  pravastatin (PRAVACHOL) 40 MG tablet, Take 40 mg by mouth daily., Disp: , Rfl:   Filed Vitals:   04/16/15 1539 04/16/15 1540 04/16/15 1543  BP: 139/75 149/80 157/81  Pulse: 68 79 70  Temp:  97.8 F (36.6 C)   TempSrc:  Oral  Resp:  16   Height:  6' (1.829 m)   Weight:  238 lb (107.956 kg)   SpO2:  97%     Body mass index is 32.27 kg/(m^2).           Review of Systems  Denies chest pain or dyspnea on exertion. Has degenerative disc disease and history of coronary artery disease with remote PT CA and stenting. Other systems negative     Objective:   Physical Exam BP 157/81 mmHg  Pulse 70  Temp(Src) 97.8 F (36.6 C) (Oral)  Resp 16  Ht 6' (1.829 m)  Wt 238 lb (107.956 kg)  BMI 32.27 kg/m2  SpO2 97%   Gen. Well-developed well-nourished male in no apparent distress alert and oriented 3  soft carotid bruits bilaterally. 3+ carotid pulses palpable Neurologic exam unremarkable Lower extremity exam reveals 3+ femoral pulses bilaterally with no palpable popliteal or distal pulses in either leg. Both feet adequately perfused.  No infection gangrene or ulceration.   The aortic carotid duplex exam which is unchanged from the study in October 2015 with some mild to moderate restenosis in the carotid endarterectomy sites bilaterally [name] also ordered lower extremity arterial Dopplers which revealed ABI of 0.50 on the left and 0.66 on the right which is very similar to 9 months ago.     Assessment:      bilateral superficial femoral and tibial occlusive disease with stable claudication left worse than right with no significant change in ABIs  mild bilateral carotid restenosis following remote carotid endarterectomy on both sides with asymptomatic situation currently and no progression of disease     Plan:      return in 9 months with carotid duplex exam and ABIs for follow-up

## 2015-04-18 NOTE — Addendum Note (Signed)
Addended by: Mena Goes on: 04/18/2015 09:01 AM   Modules accepted: Orders

## 2015-07-09 ENCOUNTER — Ambulatory Visit (INDEPENDENT_AMBULATORY_CARE_PROVIDER_SITE_OTHER): Payer: PPO | Admitting: Adult Health

## 2015-07-09 ENCOUNTER — Other Ambulatory Visit (HOSPITAL_COMMUNITY)
Admission: RE | Admit: 2015-07-09 | Discharge: 2015-07-09 | Disposition: A | Discharge status: Home / Self Care | Payer: PPO | Source: Ambulatory Visit | Attending: Adult Health | Admitting: Adult Health

## 2015-07-09 ENCOUNTER — Ambulatory Visit (HOSPITAL_COMMUNITY)
Admission: RE | Admit: 2015-07-09 | Discharge: 2015-07-09 | Disposition: A | Discharge status: Home / Self Care | Payer: PPO | Source: Ambulatory Visit | Attending: Adult Health | Admitting: Adult Health

## 2015-07-09 ENCOUNTER — Encounter: Payer: Self-pay | Admitting: Adult Health

## 2015-07-09 VITALS — BP 120/70 | HR 80 | Ht 71.0 in | Wt 246.6 lb

## 2015-07-09 DIAGNOSIS — I5031 Acute diastolic (congestive) heart failure: Secondary | ICD-10-CM

## 2015-07-09 DIAGNOSIS — I517 Cardiomegaly: Secondary | ICD-10-CM | POA: Diagnosis not present

## 2015-07-09 DIAGNOSIS — I2510 Atherosclerotic heart disease of native coronary artery without angina pectoris: Secondary | ICD-10-CM | POA: Diagnosis not present

## 2015-07-09 DIAGNOSIS — D509 Iron deficiency anemia, unspecified: Secondary | ICD-10-CM

## 2015-07-09 DIAGNOSIS — I5032 Chronic diastolic (congestive) heart failure: Secondary | ICD-10-CM

## 2015-07-09 DIAGNOSIS — R609 Edema, unspecified: Secondary | ICD-10-CM | POA: Diagnosis present

## 2015-07-09 LAB — CBC WITH DIFFERENTIAL/PLATELET
Basophils Absolute: 0.1 10*3/uL (ref 0.0–0.1)
Basophils Relative: 1 %
EOS ABS: 0.3 10*3/uL (ref 0.0–0.7)
EOS PCT: 5 %
HCT: 26.3 % — ABNORMAL LOW (ref 39.0–52.0)
HEMOGLOBIN: 8.3 g/dL — AB (ref 13.0–17.0)
LYMPHS ABS: 1.8 10*3/uL (ref 0.7–4.0)
LYMPHS PCT: 26 %
MCH: 29.2 pg (ref 26.0–34.0)
MCHC: 31.6 g/dL (ref 30.0–36.0)
MCV: 92.6 fL (ref 78.0–100.0)
MONOS PCT: 8 %
Monocytes Absolute: 0.6 10*3/uL (ref 0.1–1.0)
NEUTROS PCT: 60 %
Neutro Abs: 4.1 10*3/uL (ref 1.7–7.7)
Platelets: 185 10*3/uL (ref 150–400)
RBC: 2.84 MIL/uL — ABNORMAL LOW (ref 4.22–5.81)
RDW: 15.1 % (ref 11.5–15.5)
WBC: 6.9 10*3/uL (ref 4.0–10.5)

## 2015-07-09 LAB — BASIC METABOLIC PANEL
Anion gap: 12 (ref 5–15)
BUN: 43 mg/dL — AB (ref 6–20)
CALCIUM: 8.6 mg/dL — AB (ref 8.9–10.3)
CHLORIDE: 103 mmol/L (ref 101–111)
CO2: 25 mmol/L (ref 22–32)
CREATININE: 1.81 mg/dL — AB (ref 0.61–1.24)
GFR, EST AFRICAN AMERICAN: 41 mL/min — AB (ref >60)
GFR, EST NON AFRICAN AMERICAN: 35 mL/min — AB (ref >60)
Glucose, Bld: 209 mg/dL — ABNORMAL HIGH (ref 65–99)
Potassium: 4.6 mmol/L (ref 3.5–5.1)
SODIUM: 140 mmol/L (ref 135–145)

## 2015-07-09 LAB — MAGNESIUM: MAGNESIUM: 1.4 mg/dL — AB (ref 1.7–2.4)

## 2015-07-09 LAB — BRAIN NATRIURETIC PEPTIDE: B NATRIURETIC PEPTIDE 5: 229 pg/mL — AB (ref 0.0–100.0)

## 2015-07-09 MED ORDER — POTASSIUM CHLORIDE CRYS ER 20 MEQ PO TBCR: 20.0000 meq | EXTENDED_RELEASE_TABLET | Freq: Two times a day (BID) | ORAL | Status: DC

## 2015-07-09 MED ORDER — FUROSEMIDE 40 MG PO TABS: ORAL_TABLET | ORAL | Status: DC

## 2015-07-09 NOTE — Patient Instructions (Addendum)
Your physician recommends that you schedule a follow-up appointment on Friday with Jory Sims, NP.  Your physician recommends that you have lab work done today.  Your physician has requested that you have an echocardiogram. Echocardiography is a painless test that uses sound waves to create images of your heart. It provides your doctor with information about the size and shape of your heart and how well your heart's chambers and valves are working. This procedure takes approximately one hour. There are no restrictions for this procedure.  Your physician recommends that you return for lab work on Friday. (BMET)  Your physician has recommended you make the following change in your medication:   START LASIX TAKE 40 MG ( 1 TABLET) WHEN YOU GET HOME TODAY. STARTING TOMORROW TAKE 60 MG (1 AND 1/2 TABLET) IN THE MORNING AND 40 MG (1 TABLET) IN THE EVENING. START POTASSIUM 20 MG TAKE 40 MEQ (2 TABLETS) AS SOON AS YOU PICK UP YOUR MEDICINE. TAKE ANOTHER 40 MEQ ( 2 TABLETS) WHEN YOU TAKE YOUR NEXT DOSE OF LASIX.  STARTING TOMORROW TAKE POTASSIUM 20 MEQ TAKE ( 1 TABLET ) IN THE MORNING WITH YOUR LASIX AND TAKE 20 MEQ (1 TABLET) IN THE EVENING WITH YOUR LASIX.

## 2015-07-09 NOTE — Progress Notes (Signed)
Cardiology Office Note   Date:  07/09/2015   ID:  Stephen [name], DOB [birth year], MRN [id number]  PCP:  [name], MD  Cardiologist: McDowell/  Jory Sims, NP   Chief Complaint  Patient presents with  . Congestive Heart Failure      History of Present Illness: Stephen [name] is a 74 y.o. male who presents for ongoing assessment and management of CAD- NYHA Class II dyspnea, with other history to include hyperlipidemia, and carotid artery disease. Cardiolite from 2014 showed inferolateral scar without ischemia. He comes today with complaints of "fluid build up." Echocardiogram from January 2015 showed mild LVH with LVEF 60-65% with increased filling pressures, mild left atrial enlargement, borderline increased pulmonary pressures.   He states that he quit taking lasix about 2 years ago as he was feeling better and not retaining fluid. About two weeks ago he noticed that he was having some LEE. He remembers eating salty foods around then. The fluid continue to build up over the last two weeks, with a 10-12 wt gain. He started taking the old lasix but did not find relief. Called his PCP, Dr. [name] and go a new Rx. He took it today and has had some response. He is starting to have some trouble breathing,but only mild No chest pain, dizziness, or palpitations.   Past Medical History  Diagnosis Date  . Diabetes mellitus type II   . Essential hypertension, benign   . Hyperlipidemia   . Cerebrovascular disease     H/o CVA In 2001 followed by a right carotid endarterectomy; left carotid endarterectomy in 2008  . Coronary atherosclerosis of native coronary artery     PTCA of LAD in 1994; total obstruction of the RCA treated medically; subsequent stress nuclear study with inferior ischemia  . Peripheral vascular disease ([place])   . DDD (degenerative disc disease), lumbar     Lumbar surgery in 1984  . Carotid artery occlusion   . Stroke [facility]) 2001    Right brain    Past Surgical History   Procedure Laterality Date  . Lumbar disc surgery  2000, 2001    L5 HNP required 2 surgical procedures  . Carotid endarterectomy  2008    Left   . Carotid endarterectomy  06/15/2000    Right  . Appendectomy  1970     Current Outpatient Prescriptions  Medication Sig Dispense Refill  . amLODipine (NORVASC) 5 MG tablet Take 5 mg by mouth daily.    Marland Kitchen aspirin 81 MG tablet Take 81 mg by mouth daily.    Marland Kitchen atenolol (TENORMIN) 50 MG tablet Take 50 mg by mouth daily.      . insulin aspart (NOVOLOG) 100 UNIT/ML injection Inject 25 Units into the skin 2 (two) times daily.     . insulin glargine (LANTUS) 100 UNIT/ML injection Inject 55 Units into the skin at bedtime.     . Lansoprazole (PREVACID PO) Take by mouth as needed.    Marland Kitchen losartan (COZAAR) 25 MG tablet Take 25 mg by mouth daily.    . pravastatin (PRAVACHOL) 40 MG tablet Take 40 mg by mouth daily.    . furosemide (LASIX) 40 MG tablet TAKE 1 AND 1/2 TABLET (60 MG) IN THE AM AND TAKE 1 TABLET (40 MG) IN THE EVENING 90 tablet 3  . potassium chloride SA (KLOR-CON M20) 20 MEQ tablet Take 1 tablet (20 mEq total) by mouth 2 (two) times daily. 90 tablet 3   No current facility-administered medications for this visit.  Allergies:   Review of patient's allergies indicates no known allergies.    Social History:  The patient  reports that he quit smoking about 42 years ago. His smoking use included Cigarettes. He has a 30 pack-year smoking history. He has never used smokeless tobacco. He reports that he does not drink alcohol or use illicit drugs.   Family History:  The patient's family history includes Diabetes in his brother, daughter, mother, sister, and son; Heart attack in his mother; Heart disease in his brother and mother; Hyperlipidemia in his son.    ROS: All other systems are reviewed and negative. Unless otherwise mentioned in H&P    PHYSICAL EXAM: VS:  BP 120/70 mmHg  Pulse 80  Ht 5\' 11"  (1.803 m)  Wt 246 lb 9.6 oz (111.857 kg)   BMI 34.41 kg/m2  SpO2 90% , BMI Body mass index is 34.41 kg/(m^2). GEN: Well nourished, well developed, in no acute distress HEENT: normal Neck: Mild  JVD, carotid bruits, or masses Cardiac: IRRR; no murmurs, rubs, or gallops, 2+ pitting edema to the thighs bilaterally.  Respiratory:  Bibasilar crackles, without wheezes, GI: soft, nontender, nondistended, + BS MS: no deformity or atrophy Skin: warm and dry, no rash Neuro:  Strength and sensation are intact Psych: euthymic mood, full affect   EKG:  The ekg ordered today demonstrates SR with frequent PAC's.    Recent Labs: No results found for requested labs within last 365 days.    Lipid Panel    Component Value Date/Time   CHOL  07/06/2008 0200    174        ATP III CLASSIFICATION:  <200     mg/dL   Desirable  200-239  mg/dL   Borderline High  >=240    mg/dL   High   TRIG 441* 07/06/2008 0200   HDL 30* 07/06/2008 0200   CHOLHDL 5.8 07/06/2008 0200   VLDL UNABLE TO CALCULATE IF TRIGLYCERIDE OVER 400 mg/dL 07/06/2008 0200   LDLCALC  07/06/2008 0200    UNABLE TO CALCULATE IF TRIGLYCERIDE OVER 400 mg/dL        Total Cholesterol/HDL:CHD Risk Coronary Heart Disease Risk Table                     Men   Women  1/2 Average Risk   3.4   3.3      Wt Readings from Last 3 Encounters:  07/09/15 246 lb 9.6 oz (111.857 kg)  04/16/15 238 lb (107.956 kg)  11/20/14 236 lb 3.2 oz (107.14 kg)      ASSESSMENT AND PLAN:  1. Acute on Chronic Diastolic CHF: I will double his lasix to 40 mg BID today, and then 60 mg in am and 40 mg in pm for two days.He will be on potassium 20 mEq BID, with extra doses today.  He will have BMET, Pro-BNP, CBC and Mg+ drawn with a CXR today. He will be seen again in 3 days with another BMET. He is to weigh himself daily. I will repeat his echo for re-evaluation of his EF. He will weigh himself daily and record. Call for any problems in between this visit and next.   2. Hypertension: He is on losartan. BP  is labile. Continue current dose.   3. CAD: May need to repeat ischemic testing if EF is decreased. I will see him in 3 days.      Current medicines are reviewed at length with the patient today.  Labs/ tests ordered today include: BMET, CBC, Mg+, Pro-BNP, CXR, and Echo  Orders Placed This Encounter  Procedures  . DG Chest 2 View  . Basic Metabolic Panel (BMET)  . CBC with Differential  . Magnesium  . B Nat Peptide  . EKG 12-Lead  . Echocardiogram     Disposition:   FU with 3 days.  Signed, Jory Sims, NP  07/09/2015 3:13 PM    [facility] [address] [place], [place] [address] Phone: [phone]; Fax: [phone]

## 2015-07-09 NOTE — Progress Notes (Deleted)
Name: Stephen [name]    DOB: 07/07/1941  Age: 74 y.o.  MR#: [id number]       PCP:  [name], MD      Insurance: Payor: Tenn[employer] / Plan: Tennis Must / Product Type: *No Product type* /   CC:   No chief complaint on file.   VS Filed Vitals:   07/09/15 1408  BP: 120/70  Pulse: 80  Height: 5' 11"  (1.803 m)  Weight: 246 lb 9.6 oz (111.857 kg)  SpO2: 90%    Weights Current Weight  07/09/15 246 lb 9.6 oz (111.857 kg)  04/16/15 238 lb (107.956 kg)  11/20/14 236 lb 3.2 oz (107.14 kg)    Blood Pressure  BP Readings from Last 3 Encounters:  07/09/15 120/70  04/16/15 157/81  11/20/14 138/70     Admit date:  (Not on file) Last encounter with RMR:  Visit date not found   Allergy Review of patient's allergies indicates no known allergies.  Current Outpatient Prescriptions  Medication Sig Dispense Refill  . amLODipine (NORVASC) 5 MG tablet Take 5 mg by mouth daily.    Marland Kitchen aspirin 81 MG tablet Take 81 mg by mouth daily.    Marland Kitchen atenolol (TENORMIN) 50 MG tablet Take 50 mg by mouth daily.      . insulin aspart (NOVOLOG) 100 UNIT/ML injection Inject 25 Units into the skin 2 (two) times daily.     . insulin glargine (LANTUS) 100 UNIT/ML injection Inject 55 Units into the skin at bedtime.     . Lansoprazole (PREVACID PO) Take by mouth as needed.    Marland Kitchen losartan (COZAAR) 25 MG tablet Take 25 mg by mouth daily.    . pravastatin (PRAVACHOL) 40 MG tablet Take 40 mg by mouth daily.     No current facility-administered medications for this visit.    Discontinued Meds:    Medications Discontinued During This Encounter  Medication Reason  . doxazosin (CARDURA) 2 MG tablet Error  . fenofibrate 160 MG tablet Error    Patient Active Problem List   Diagnosis Date Noted  . Irregular heartbeat 07/19/2014  . Atherosclerotic PVD with intermittent claudication ([place]) 07/17/2014  . Carotid stenosis 07/12/2014  . Aftercare following surgery of the circulatory system 07/12/2014  .  Chronic diastolic heart failure ([place]) 09/27/2013  . Occlusion and stenosis of carotid artery without mention of cerebral infarction 06/28/2012  . Chronic kidney disease 06/01/2012  . Diabetes mellitus type II   . Essential hypertension, benign   . Hyperlipidemia   . Cerebrovascular disease   . Coronary atherosclerosis of native coronary artery   . Peripheral vascular disease (HCC)     LABS    Component Value Date/Time   NA 133* 07/09/2008 0731   NA 132* 07/08/2008 1225   NA 130* 07/08/2008 0540   K 4.7 07/09/2008 0731   K 4.6 07/08/2008 1225   K 5.2* 07/08/2008 0540   CL 98 07/09/2008 0731   CL 99 07/08/2008 1225   CL 98 07/08/2008 0540   CO2 25 07/09/2008 0731   CO2 26 07/08/2008 1225   CO2 21 07/08/2008 0540   GLUCOSE 207* 07/09/2008 0731   GLUCOSE 221* 07/08/2008 1225   GLUCOSE 194* 07/08/2008 0540   BUN 36* 07/09/2008 0731   BUN 31* 07/08/2008 1225   BUN 31* 07/08/2008 0540   CREATININE 1.59* 07/09/2008 0731   CREATININE 1.59* 07/08/2008 1225   CREATININE 1.58* 07/08/2008 0540   CALCIUM 9.2 07/09/2008 0731   CALCIUM 9.3 07/08/2008 1225  CALCIUM 9.0 07/08/2008 0540   GFRNONAA 44* 07/09/2008 0731   GFRNONAA 44* 07/08/2008 1225   GFRNONAA 44* 07/08/2008 0540   GFRAA * 07/09/2008 0731    53        The eGFR has been calculated using the MDRD equation. This calculation has not been validated in all clinical   GFRAA * 07/08/2008 1225    53        The eGFR has been calculated using the MDRD equation. This calculation has not been validated in all clinical   GFRAA * 07/08/2008 0540    53        The eGFR has been calculated using the MDRD equation. This calculation has not been validated in all clinical   CMP     Component Value Date/Time   NA 133* 07/09/2008 0731   K 4.7 07/09/2008 0731   CL 98 07/09/2008 0731   CO2 25 07/09/2008 0731   GLUCOSE 207* 07/09/2008 0731   BUN 36* 07/09/2008 0731   CREATININE 1.59* 07/09/2008 0731   CALCIUM 9.2 07/09/2008  0731   PROT 7.2 07/05/2008 0922   ALBUMIN 3.6 07/05/2008 0922   AST 21 07/05/2008 0922   ALT 15 07/05/2008 0922   ALKPHOS 75 07/05/2008 0922   BILITOT 0.5 07/05/2008 0922   GFRNONAA 44* 07/09/2008 0731   GFRAA * 07/09/2008 0731    53        The eGFR has been calculated using the MDRD equation. This calculation has not been validated in all clinical       Component Value Date/Time   WBC 7.9 WHITE COUNT CONFIRMED ON SMEAR 07/08/2008 0540   WBC 6.5 07/06/2008 0145   WBC 5.8 07/05/2008 0922   HGB 11.9* 07/08/2008 0540   HGB 11.3* 07/06/2008 0145   HGB 12.1* 07/05/2008 0922   HCT 33.9* 07/08/2008 0540   HCT 32.9* 07/06/2008 0145   HCT 35.2* 07/05/2008 0922   MCV 92.7 07/08/2008 0540   MCV 92.8 07/06/2008 0145   MCV 92.6 07/05/2008 0922    Lipid Panel     Component Value Date/Time   CHOL  07/06/2008 0200    174        ATP III CLASSIFICATION:  <200     mg/dL   Desirable  200-239  mg/dL   Borderline High  >=240    mg/dL   High   TRIG 441* 07/06/2008 0200   HDL 30* 07/06/2008 0200   CHOLHDL 5.8 07/06/2008 0200   VLDL UNABLE TO CALCULATE IF TRIGLYCERIDE OVER 400 mg/dL 07/06/2008 0200   LDLCALC  07/06/2008 0200    UNABLE TO CALCULATE IF TRIGLYCERIDE OVER 400 mg/dL        Total Cholesterol/HDL:CHD Risk Coronary Heart Disease Risk Table                     Men   Women  1/2 Average Risk   3.4   3.3    ABG No results found for: PHART, PCO2ART, PO2ART, HCO3, TCO2, ACIDBASEDEF, O2SAT   No results found for: TSH BNP (last 3 results) No results for input(s): BNP in the last 8760 hours.  ProBNP (last 3 results) No results for input(s): PROBNP in the last 8760 hours.  Cardiac Panel (last 3 results) No results for input(s): CKTOTAL, CKMB, TROPONINI, RELINDX in the last 72 hours.  Iron/TIBC/Ferritin/ %Sat No results found for: IRON, TIBC, FERRITIN, IRONPCTSAT   EKG Orders placed or performed in visit on 07/19/14  . EKG  12-Lead     Prior Assessment and Plan Problem  List as of 07/09/2015      Cardiovascular and Mediastinum   Essential hypertension, benign   Last Assessment & Plan 09/27/2013 Office Visit Written 09/27/2013 10:38 AM by Satira Sark, MD    Followed by Dr. [name], on Norvasc, Tenormin, and lisinopril.      Cerebrovascular disease   Last Assessment & Plan 06/01/2012 Office Visit Written 06/01/2012  9:07 PM by Yehuda Savannah, MD    Has also done well since bilateral carotid surgery, most recently on the left in 2008.  He is followed by Dr. Kellie Simmering for his vascular disease.  Carotid ultrasound was recently performed in 01/2011 at which time both ICAs were patent without significant focal stenosis.      Coronary atherosclerosis of native coronary artery   Last Assessment & Plan 07/19/2014 Office Visit Written 07/19/2014 11:12 AM by Satira Sark, MD    No active angina symptoms, Cardiolite study from January was overall low risk showing inferolateral scar without ischemia.      Peripheral vascular disease [facility])   Last Assessment & Plan 09/27/2013 Office Visit Written 09/27/2013 10:40 AM by Satira Sark, MD    Stable claudication, most recent arterial Dopplers noted above.      Occlusion and stenosis of carotid artery without mention of cerebral infarction   Last Assessment & Plan 09/27/2013 Office Visit Written 09/27/2013 10:39 AM by Satira Sark, MD    Status post bilateral CEA, followed by Dr. Kellie Simmering.      Chronic diastolic heart failure [facility])   Last Assessment & Plan 10/27/2013 Office Visit Written 10/27/2013  9:33 AM by Satira Sark, MD    Continue current regimen, agree with reducing Lasix dose, check daily weights. Keep regular 3 month follow up with Dr. [name].      Carotid stenosis   Atherosclerotic PVD with intermittent claudication [facility])     Endocrine   Diabetes mellitus type II   Last Assessment & Plan 09/27/2013 Office Visit Written 09/27/2013 10:38 AM by Satira Sark, MD    Followed by Dr. [name], on insulin.         Genitourinary   Chronic kidney disease   Last Assessment & Plan 10/27/2013 Office Visit Written 10/27/2013  9:35 AM by Satira Sark, MD    Recent creatinine 2.0.        Other   Hyperlipidemia   Last Assessment & Plan 10/27/2013 Office Visit Written 10/27/2013  9:35 AM by Satira Sark, MD    Continues on Pravachol.      Aftercare following surgery of the circulatory system   Irregular heartbeat   Last Assessment & Plan 07/19/2014 Office Visit Written 07/19/2014 11:12 AM by Satira Sark, MD    Patient is in sinus rhythm with PACs by ECG, not atrial fibrillation. He is unaware of any palpitations. Plan to continue beta blocker therapy and observation.          Imaging: No results found.

## 2015-07-10 ENCOUNTER — Ambulatory Visit (HOSPITAL_COMMUNITY)
Admission: RE | Admit: 2015-07-10 | Discharge: 2015-07-10 | Disposition: A | Discharge status: Home / Self Care | Payer: PPO | Source: Ambulatory Visit | Attending: Adult Health | Admitting: Adult Health

## 2015-07-10 DIAGNOSIS — Z794 Long term (current) use of insulin: Secondary | ICD-10-CM | POA: Diagnosis not present

## 2015-07-10 DIAGNOSIS — I509 Heart failure, unspecified: Secondary | ICD-10-CM | POA: Diagnosis not present

## 2015-07-10 DIAGNOSIS — D509 Iron deficiency anemia, unspecified: Secondary | ICD-10-CM | POA: Diagnosis not present

## 2015-07-10 DIAGNOSIS — E119 Type 2 diabetes mellitus without complications: Secondary | ICD-10-CM | POA: Diagnosis not present

## 2015-07-10 DIAGNOSIS — I5031 Acute diastolic (congestive) heart failure: Secondary | ICD-10-CM | POA: Diagnosis not present

## 2015-07-10 DIAGNOSIS — Z87891 Personal history of nicotine dependence: Secondary | ICD-10-CM | POA: Diagnosis not present

## 2015-07-10 DIAGNOSIS — I10 Essential (primary) hypertension: Secondary | ICD-10-CM | POA: Diagnosis not present

## 2015-07-10 DIAGNOSIS — E785 Hyperlipidemia, unspecified: Secondary | ICD-10-CM | POA: Diagnosis not present

## 2015-07-10 DIAGNOSIS — I2510 Atherosclerotic heart disease of native coronary artery without angina pectoris: Secondary | ICD-10-CM

## 2015-07-11 ENCOUNTER — Telehealth: Payer: Self-pay

## 2015-07-11 ENCOUNTER — Ambulatory Visit (INDEPENDENT_AMBULATORY_CARE_PROVIDER_SITE_OTHER): Payer: PPO | Admitting: Adult Health

## 2015-07-11 ENCOUNTER — Encounter: Payer: Self-pay | Admitting: Adult Health

## 2015-07-11 ENCOUNTER — Other Ambulatory Visit (HOSPITAL_COMMUNITY)
Admission: RE | Admit: 2015-07-11 | Discharge: 2015-07-11 | Disposition: A | Discharge status: Home / Self Care | Payer: PPO | Source: Ambulatory Visit | Attending: Adult Health | Admitting: Adult Health

## 2015-07-11 VITALS — BP 138/52 | HR 74 | Ht 71.0 in | Wt 237.0 lb

## 2015-07-11 DIAGNOSIS — D509 Iron deficiency anemia, unspecified: Secondary | ICD-10-CM

## 2015-07-11 DIAGNOSIS — D508 Other iron deficiency anemias: Secondary | ICD-10-CM

## 2015-07-11 DIAGNOSIS — I2510 Atherosclerotic heart disease of native coronary artery without angina pectoris: Secondary | ICD-10-CM

## 2015-07-11 DIAGNOSIS — I5032 Chronic diastolic (congestive) heart failure: Secondary | ICD-10-CM

## 2015-07-11 DIAGNOSIS — I10 Essential (primary) hypertension: Secondary | ICD-10-CM

## 2015-07-11 DIAGNOSIS — I5031 Acute diastolic (congestive) heart failure: Secondary | ICD-10-CM

## 2015-07-11 DIAGNOSIS — E8779 Other fluid overload: Secondary | ICD-10-CM

## 2015-07-11 LAB — CBC
HEMATOCRIT: 27.6 % — AB (ref 39.0–52.0)
HEMOGLOBIN: 8.7 g/dL — AB (ref 13.0–17.0)
MCH: 29.1 pg (ref 26.0–34.0)
MCHC: 31.5 g/dL (ref 30.0–36.0)
MCV: 92.3 fL (ref 78.0–100.0)
Platelets: 212 10*3/uL (ref 150–400)
RBC: 2.99 MIL/uL — AB (ref 4.22–5.81)
RDW: 15.3 % (ref 11.5–15.5)
WBC: 6.8 10*3/uL (ref 4.0–10.5)

## 2015-07-11 LAB — BASIC METABOLIC PANEL
ANION GAP: 10 (ref 5–15)
BUN: 52 mg/dL — ABNORMAL HIGH (ref 6–20)
CHLORIDE: 99 mmol/L — AB (ref 101–111)
CO2: 29 mmol/L (ref 22–32)
Calcium: 9 mg/dL (ref 8.9–10.3)
Creatinine, Ser: 2.37 mg/dL — ABNORMAL HIGH (ref 0.61–1.24)
GFR calc Af Amer: 29 mL/min — ABNORMAL LOW (ref >60)
GFR calc non Af Amer: 25 mL/min — ABNORMAL LOW (ref >60)
GLUCOSE: 120 mg/dL — AB (ref 65–99)
POTASSIUM: 5.5 mmol/L — AB (ref 3.5–5.1)
Sodium: 138 mmol/L (ref 135–145)

## 2015-07-11 MED ORDER — POTASSIUM CHLORIDE CRYS ER 20 MEQ PO TBCR: 20.0000 meq | EXTENDED_RELEASE_TABLET | Freq: Every day | ORAL | Status: DC

## 2015-07-11 MED ORDER — MAGNESIUM OXIDE 400 MG PO CAPS: 400.0000 mg | ORAL_CAPSULE | Freq: Two times a day (BID) | ORAL | Status: DC

## 2015-07-11 MED ORDER — FERROUS SULFATE 324 (65 FE) MG PO TBEC: 324.0000 mg | DELAYED_RELEASE_TABLET | Freq: Every day | ORAL | Status: DC

## 2015-07-11 MED ORDER — FUROSEMIDE 40 MG PO TABS: 40.0000 mg | ORAL_TABLET | Freq: Every day | ORAL | Status: DC

## 2015-07-11 NOTE — Telephone Encounter (Signed)
Patient will reduce lasix to 40 mg daily.Copy labs to pcp

## 2015-07-11 NOTE — Telephone Encounter (Signed)
-----   Message from Lendon Colonel, NP sent at 07/11/2015 10:00 AM EDT ----- Please send results to his PCP and to nephrologist. Will need to reduce the lasix back down  To 40 mg daily. And reduce the potassium down to 20 mEq daily. He will need work-up for anemia. Will discuss this with him on visit today.

## 2015-07-11 NOTE — Telephone Encounter (Signed)
Pt will get STAT labs this am,rx to pharmacy for magnesium

## 2015-07-11 NOTE — Progress Notes (Signed)
Name: Stephen [name]    DOB: 10-26-1940  Age: 74 y.o.  MR#: [id number]       PCP:  [name], MD      Insurance: Payor: Tenn[employer] / Plan: Tennis Must / Product Type: *No Product type* /   CC:   No chief complaint on file.   VS Filed Vitals:   07/11/15 1525  BP: 138/52  Pulse: 74  Height: 5' 11"  (1.803 m)  Weight: 237 lb (107.502 kg)  SpO2: 95%    Weights Current Weight  07/11/15 237 lb (107.502 kg)  07/09/15 246 lb 9.6 oz (111.857 kg)  04/16/15 238 lb (107.956 kg)    Blood Pressure  BP Readings from Last 3 Encounters:  07/11/15 138/52  07/09/15 120/70  04/16/15 157/81     Admit date:  (Not on file) Last encounter with RMR:  07/09/2015   Allergy Review of patient's allergies indicates no known allergies.  Current Outpatient Prescriptions  Medication Sig Dispense Refill  . amLODipine (NORVASC) 5 MG tablet Take 5 mg by mouth daily.    Marland Kitchen aspirin 81 MG tablet Take 81 mg by mouth daily.    Marland Kitchen atenolol (TENORMIN) 50 MG tablet Take 50 mg by mouth daily.      . furosemide (LASIX) 40 MG tablet Take 1 tablet (40 mg total) by mouth daily. 90 tablet 3  . insulin aspart (NOVOLOG) 100 UNIT/ML injection Inject 25 Units into the skin 2 (two) times daily.     . insulin glargine (LANTUS) 100 UNIT/ML injection Inject 65 Units into the skin at bedtime.     . Lansoprazole (PREVACID PO) Take by mouth as needed.    Marland Kitchen losartan (COZAAR) 25 MG tablet Take 25 mg by mouth daily.    . Magnesium Oxide 400 MG CAPS Take 1 capsule (400 mg total) by mouth 2 (two) times daily. 60 capsule 3  . potassium chloride SA (KLOR-CON M20) 20 MEQ tablet Take 1 tablet (20 mEq total) by mouth daily. 45 tablet 3  . pravastatin (PRAVACHOL) 40 MG tablet Take 40 mg by mouth daily.     No current facility-administered medications for this visit.    Discontinued Meds:   There are no discontinued medications.  Patient Active Problem List   Diagnosis Date Noted  . Irregular heartbeat 07/19/2014  .  Atherosclerotic PVD with intermittent claudication ([place]) 07/17/2014  . Carotid stenosis 07/12/2014  . Aftercare following surgery of the circulatory system 07/12/2014  . Chronic diastolic heart failure ([place]) 09/27/2013  . Occlusion and stenosis of carotid artery without mention of cerebral infarction 06/28/2012  . Chronic kidney disease 06/01/2012  . Diabetes mellitus type II   . Essential hypertension, benign   . Hyperlipidemia   . Cerebrovascular disease   . Coronary atherosclerosis of native coronary artery   . Peripheral vascular disease (HCC)     LABS    Component Value Date/Time   NA 138 07/11/2015 0835   NA 140 07/09/2015 1548   NA 133* 07/09/2008 0731   K 5.5* 07/11/2015 0835   K 4.6 07/09/2015 1548   K 4.7 07/09/2008 0731   CL 99* 07/11/2015 0835   CL 103 07/09/2015 1548   CL 98 07/09/2008 0731   CO2 29 07/11/2015 0835   CO2 25 07/09/2015 1548   CO2 25 07/09/2008 0731   GLUCOSE 120* 07/11/2015 0835   GLUCOSE 209* 07/09/2015 1548   GLUCOSE 207* 07/09/2008 0731   BUN 52* 07/11/2015 0835   BUN 43* 07/09/2015 1548  BUN 36* 07/09/2008 0731   CREATININE 2.37* 07/11/2015 0835   CREATININE 1.81* 07/09/2015 1548   CREATININE 1.59* 07/09/2008 0731   CALCIUM 9.0 07/11/2015 0835   CALCIUM 8.6* 07/09/2015 1548   CALCIUM 9.2 07/09/2008 0731   GFRNONAA 25* 07/11/2015 0835   GFRNONAA 35* 07/09/2015 1548   GFRNONAA 44* 07/09/2008 0731   GFRAA 29* 07/11/2015 0835   GFRAA 41* 07/09/2015 1548   GFRAA * 07/09/2008 0731    53        The eGFR has been calculated using the MDRD equation. This calculation has not been validated in all clinical   CMP     Component Value Date/Time   NA 138 07/11/2015 0835   K 5.5* 07/11/2015 0835   CL 99* 07/11/2015 0835   CO2 29 07/11/2015 0835   GLUCOSE 120* 07/11/2015 0835   BUN 52* 07/11/2015 0835   CREATININE 2.37* 07/11/2015 0835   CALCIUM 9.0 07/11/2015 0835   PROT 7.2 07/05/2008 0922   ALBUMIN 3.6 07/05/2008 0922   AST 21  07/05/2008 0922   ALT 15 07/05/2008 0922   ALKPHOS 75 07/05/2008 0922   BILITOT 0.5 07/05/2008 0922   GFRNONAA 25* 07/11/2015 0835   GFRAA 29* 07/11/2015 0835       Component Value Date/Time   WBC 6.8 07/11/2015 0835   WBC 6.9 07/09/2015 1548   WBC 7.9 WHITE COUNT CONFIRMED ON SMEAR 07/08/2008 0540   HGB 8.7* 07/11/2015 0835   HGB 8.3* 07/09/2015 1548   HGB 11.9* 07/08/2008 0540   HCT 27.6* 07/11/2015 0835   HCT 26.3* 07/09/2015 1548   HCT 33.9* 07/08/2008 0540   MCV 92.3 07/11/2015 0835   MCV 92.6 07/09/2015 1548   MCV 92.7 07/08/2008 0540    Lipid Panel     Component Value Date/Time   CHOL  07/06/2008 0200    174        ATP III CLASSIFICATION:  <200     mg/dL   Desirable  200-239  mg/dL   Borderline High  >=240    mg/dL   High   TRIG 441* 07/06/2008 0200   HDL 30* 07/06/2008 0200   CHOLHDL 5.8 07/06/2008 0200   VLDL UNABLE TO CALCULATE IF TRIGLYCERIDE OVER 400 mg/dL 07/06/2008 0200   LDLCALC  07/06/2008 0200    UNABLE TO CALCULATE IF TRIGLYCERIDE OVER 400 mg/dL        Total Cholesterol/HDL:CHD Risk Coronary Heart Disease Risk Table                     Men   Women  1/2 Average Risk   3.4   3.3    ABG No results found for: PHART, PCO2ART, PO2ART, HCO3, TCO2, ACIDBASEDEF, O2SAT   No results found for: TSH BNP (last 3 results)  Recent Labs  07/09/15 1548  BNP 229.0*    ProBNP (last 3 results) No results for input(s): PROBNP in the last 8760 hours.  Cardiac Panel (last 3 results) No results for input(s): CKTOTAL, CKMB, TROPONINI, RELINDX in the last 72 hours.  Iron/TIBC/Ferritin/ %Sat No results found for: IRON, TIBC, FERRITIN, IRONPCTSAT   EKG Orders placed or performed in visit on 07/09/15  . EKG 12-Lead     Prior Assessment and Plan Problem List as of 07/11/2015      Cardiovascular and Mediastinum   Essential hypertension, benign   Last Assessment & Plan 09/27/2013 Office Visit Written 09/27/2013 10:38 AM by Satira Sark, MD    Followed by  Dr. [name], on Norvasc, Tenormin, and lisinopril.      Cerebrovascular disease   Last Assessment & Plan 06/01/2012 Office Visit Written 06/01/2012  9:07 PM by Yehuda Savannah, MD    Has also done well since bilateral carotid surgery, most recently on the left in 2008.  He is followed by Dr. Kellie Simmering for his vascular disease.  Carotid ultrasound was recently performed in 01/2011 at which time both ICAs were patent without significant focal stenosis.      Coronary atherosclerosis of native coronary artery   Last Assessment & Plan 07/19/2014 Office Visit Written 07/19/2014 11:12 AM by Satira Sark, MD    No active angina symptoms, Cardiolite study from January was overall low risk showing inferolateral scar without ischemia.      Peripheral vascular disease [facility])   Last Assessment & Plan 09/27/2013 Office Visit Written 09/27/2013 10:40 AM by Satira Sark, MD    Stable claudication, most recent arterial Dopplers noted above.      Occlusion and stenosis of carotid artery without mention of cerebral infarction   Last Assessment & Plan 09/27/2013 Office Visit Written 09/27/2013 10:39 AM by Satira Sark, MD    Status post bilateral CEA, followed by Dr. Kellie Simmering.      Chronic diastolic heart failure [facility])   Last Assessment & Plan 10/27/2013 Office Visit Written 10/27/2013  9:33 AM by Satira Sark, MD    Continue current regimen, agree with reducing Lasix dose, check daily weights. Keep regular 3 month follow up with Dr. [name].      Carotid stenosis   Atherosclerotic PVD with intermittent claudication [facility])     Endocrine   Diabetes mellitus type II   Last Assessment & Plan 09/27/2013 Office Visit Written 09/27/2013 10:38 AM by Satira Sark, MD    Followed by Dr. [name], on insulin.        Genitourinary   Chronic kidney disease   Last Assessment & Plan 10/27/2013 Office Visit Written 10/27/2013  9:35 AM by Satira Sark, MD    Recent creatinine 2.0.        Other   Hyperlipidemia    Last Assessment & Plan 10/27/2013 Office Visit Written 10/27/2013  9:35 AM by Satira Sark, MD    Continues on Pravachol.      Aftercare following surgery of the circulatory system   Irregular heartbeat   Last Assessment & Plan 07/19/2014 Office Visit Written 07/19/2014 11:12 AM by Satira Sark, MD    Patient is in sinus rhythm with PACs by ECG, not atrial fibrillation. He is unaware of any palpitations. Plan to continue beta blocker therapy and observation.          Imaging: Dg Chest 2 View  07/09/2015  CLINICAL DATA:  Patient with fluid retention for 2 months. EXAM: CHEST  2 VIEW COMPARISON:  Chest radiograph 09/18/2013 FINDINGS: Stable enlarged cardiac and mediastinal contours with tortuosity the thoracic aorta. No consolidative pulmonary opacities. Pulmonary vascular redistribution. No pleural effusion or pneumothorax. Mid thoracic spine degenerative changes. IMPRESSION: No acute cardiopulmonary process. Cardiomegaly. Pulmonary vascular redistribution. Electronically Signed   By: Lovey Newcomer M.D.   On: 07/09/2015 16:33

## 2015-07-11 NOTE — Patient Instructions (Addendum)
Your physician recommends that you schedule a follow-up appointment in: 4 Months   Your physician has recommended you make the following change in your medication:   Start : Iron 324 mg Daily  Stop Taking Aspirin   Please make follow-up appt. With Dr. [name].  If you need a refill on your cardiac medications before your next appointment, please call your pharmacy.    Thank you for choosing [facility]!

## 2015-07-11 NOTE — Telephone Encounter (Signed)
-----   Message from Lendon Colonel, NP sent at 07/11/2015  7:01 AM EDT ----- Labs are reviewed. He will need magnesium replacement. Begin 400 mg BID now. Labs should be repeated early Friday so we can evaluate him before his appointment tomorrow afternoon. Make them STAT please. He is also anemic. May be from fluid overload. Make sure CBC is ordered with his labs on Friday please. Please ask him if his fluid retention is better with lasix increase. Thank you!

## 2015-07-11 NOTE — Progress Notes (Signed)
Cardiology Office Note   Date:  07/11/2015   ID:  [name], Main 06/08/1941, MRN [id number]  PCP:  Stephen Noble, MD  Cardiologist:   Stephen Sims, NP   Chief Complaint  Patient presents with  . Congestive Heart Failure  . Hypertension      History of Present Illness: Stephen [name] is a 74 y.o. male who presents for for ongoing assessment and management of CAD- NYHA Class II dyspnea, with other history to include hyperlipidemia, and carotid artery disease. Cardiolite from 2014 showed inferolateral scar without ischemia. He comes today with complaints of "fluid build up." Echocardiogram from January 2015 showed mild LVH with LVEF 60-65% with increased filling pressures, mild left atrial enlargement, borderline increased pulmonary pressures.   He was found to have fluid overload on last office visit. He was increased on his lasix with follow up labs. Echocardiogram was ordered.   Left ventricle: The cavity size was normal. Systolic function was normal. The estimated ejection fraction was in the range of 60% to 65%. Features are consistent with a pseudonormal left ventricular filling pattern, with concomitant abnormal relaxation and increased filling pressure (grade 2 diastolic dysfunction). Doppler parameters are consistent with high ventricular filling pressure. Mild posterior wall thickening. - Ventricular septum: The contour showed systolic flattening. These changes are consistent with RV pressure overload. - Aortic valve: Moderate non-coronary cusp thickening, mild thickening of left and right coronary cusps. - Mitral valve: There was moderate regurgitation. - Left atrium: The atrium was mildly dilated. - Right atrium: The atrium was mildly dilated. - Tricuspid valve: There was moderate regurgitation. - Pulmonary arteries: Systolic pressure was moderately increased. PA peak pressure: 58 mm Hg (S).  Labs demonstrated anemia, with Hgb of 8.3, with  creatinine of 1.81. He is here for re-evaluation of his status.   On today's visit, he had diuresed 9 lbs and is feeling and breathing better. He continues to have some lower extremity edema, much improved however.He denies chest pain dizziness, or tarry stools.   Past Medical History  Diagnosis Date  . Diabetes mellitus type II   . Essential hypertension, benign   . Hyperlipidemia   . Cerebrovascular disease     H/o CVA In 2001 followed by a right carotid endarterectomy; left carotid endarterectomy in 2008  . Coronary atherosclerosis of native coronary artery     PTCA of LAD in 1994; total obstruction of the RCA treated medically; subsequent stress nuclear study with inferior ischemia  . Peripheral vascular disease ([place])   . DDD (degenerative disc disease), lumbar     Lumbar surgery in 1984  . Carotid artery occlusion   . Stroke [facility]) 2001    Right brain    Past Surgical History  Procedure Laterality Date  . Lumbar disc surgery  2000, 2001    L5 HNP required 2 surgical procedures  . Carotid endarterectomy  2008    Left   . Carotid endarterectomy  06/15/2000    Right  . Appendectomy  1970     Current Outpatient Prescriptions  Medication Sig Dispense Refill  . amLODipine (NORVASC) 5 MG tablet Take 5 mg by mouth daily.    Marland Kitchen aspirin 81 MG tablet Take 81 mg by mouth daily.    Marland Kitchen atenolol (TENORMIN) 50 MG tablet Take 50 mg by mouth daily.      . furosemide (LASIX) 40 MG tablet Take 1 tablet (40 mg total) by mouth daily. 90 tablet 3  . insulin aspart (NOVOLOG) 100 UNIT/ML injection Inject  25 Units into the skin 2 (two) times daily.     . insulin glargine (LANTUS) 100 UNIT/ML injection Inject 65 Units into the skin at bedtime.     . Lansoprazole (PREVACID PO) Take by mouth as needed.    Marland Kitchen losartan (COZAAR) 25 MG tablet Take 25 mg by mouth daily.    . Magnesium Oxide 400 MG CAPS Take 1 capsule (400 mg total) by mouth 2 (two) times daily. 60 capsule 3  . potassium chloride SA  (KLOR-CON M20) 20 MEQ tablet Take 1 tablet (20 mEq total) by mouth daily. 45 tablet 3  . pravastatin (PRAVACHOL) 40 MG tablet Take 40 mg by mouth daily.     No current facility-administered medications for this visit.    Allergies:   Review of patient's allergies indicates no known allergies.    Social History:  The patient  reports that he quit smoking about 42 years ago. His smoking use included Cigarettes. He has a 30 pack-year smoking history. He has never used smokeless tobacco. He reports that he does not drink alcohol or use illicit drugs.   Family History:  The patient's family history includes Diabetes in his brother, daughter, mother, sister, and son; Heart attack in his mother; Heart disease in his brother and mother; Hyperlipidemia in his son.    ROS: All other systems are reviewed and negative. Unless otherwise mentioned in H&P    PHYSICAL EXAM: VS:  BP 138/52 mmHg  Pulse 74  Ht 5\' 11"  (1.803 m)  Wt 237 lb (107.502 kg)  BMI 33.07 kg/m2  SpO2 95% , BMI Body mass index is 33.07 kg/(m^2). GEN: Well nourished, well developed, in no acute distress HEENT: normal Neck: no JVD, carotid bruits, or masses Cardiac: RRR; no murmurs, rubs, or gallops,no edema  Respiratory:  clear to auscultation bilaterally, normal work of breathing GI: soft, nontender, nondistended, + BS MS: no deformity or atrophy Skin: warm and dry, no rash Neuro:  Strength and sensation are intact Psych: euthymic mood, full affect  Recent Labs: 07/09/2015: B Natriuretic Peptide 229.0*; Magnesium 1.4* 07/11/2015: BUN 52*; Creatinine, Ser 2.37*; Hemoglobin 8.7*; Platelets 212; Potassium 5.5*; Sodium 138    Lipid Panel    Component Value Date/Time   CHOL  07/06/2008 0200    174        ATP III CLASSIFICATION:  <200     mg/dL   Desirable  200-239  mg/dL   Borderline High  >=240    mg/dL   High   TRIG 441* 07/06/2008 0200   HDL 30* 07/06/2008 0200   CHOLHDL 5.8 07/06/2008 0200   VLDL UNABLE TO  CALCULATE IF TRIGLYCERIDE OVER 400 mg/dL 07/06/2008 0200   LDLCALC  07/06/2008 0200    UNABLE TO CALCULATE IF TRIGLYCERIDE OVER 400 mg/dL        Total Cholesterol/HDL:CHD Risk Coronary Heart Disease Risk Table                     Men   Women  1/2 Average Risk   3.4   3.3      Wt Readings from Last 3 Encounters:  07/11/15 237 lb (107.502 kg)  07/09/15 246 lb 9.6 oz (111.857 kg)  04/16/15 238 lb (107.956 kg)      ASSESSMENT AND PLAN:  1. Diastolic CHF: He has diuresed 9 lbs with increased dose of lasix. I will drop him back down to 40 mg daily with 20 mEq of potassium daily. He will continue magnesium as  well. He may again retain some fluid with anemia. I have asked him to increase his lasix to 1 1/2 tablets on those days he gains fluid. Labs will be repeated on follow up with Dr. [name] next week.   2. Anemia: Not improved with diureses. Hgb of 8.7. He will be placed on iron supplement.. I have called and spoke to Dr. [name] about his labs and also his recent symptoms. He is to see him in 6 days. I have warned the patient if he begins to have worsening dyspnea, chest pain or dizziness, he is to seek treatment in the ER. Dr. [name] is in agreement with this. Labs are provided to the patient for his own records.  Will discontinue ASA for now.    3. Hypertension: BP is stable. Continue amlodipine.     Current medicines are reviewed at length with the patient today.    Labs/ tests ordered today include: No orders of the defined types were placed in this encounter.     Disposition:   FU with 4 months.    Signed, Stephen Sims, NP  07/11/2015 3:49 PM    [facility] [address] [place], [place] [address] Phone: [phone]; Fax: [phone]

## 2015-07-11 NOTE — Telephone Encounter (Signed)
Pt made aware to reduce KCL to 20 mg daily per Arnold Long NP

## 2015-08-07 ENCOUNTER — Encounter (INDEPENDENT_AMBULATORY_CARE_PROVIDER_SITE_OTHER): Payer: Self-pay | Admitting: *Deleted

## 2015-09-02 ENCOUNTER — Telehealth (INDEPENDENT_AMBULATORY_CARE_PROVIDER_SITE_OTHER): Payer: Self-pay | Admitting: *Deleted

## 2015-09-02 ENCOUNTER — Other Ambulatory Visit (INDEPENDENT_AMBULATORY_CARE_PROVIDER_SITE_OTHER): Payer: Self-pay | Admitting: Internal Medicine

## 2015-09-02 ENCOUNTER — Encounter (INDEPENDENT_AMBULATORY_CARE_PROVIDER_SITE_OTHER): Payer: Self-pay | Admitting: Internal Medicine

## 2015-09-02 ENCOUNTER — Ambulatory Visit (INDEPENDENT_AMBULATORY_CARE_PROVIDER_SITE_OTHER): Payer: PPO | Admitting: Internal Medicine

## 2015-09-02 VITALS — BP 112/60 | HR 60 | Temp 98.0°F | Ht 72.0 in | Wt 236.0 lb

## 2015-09-02 DIAGNOSIS — Z1211 Encounter for screening for malignant neoplasm of colon: Secondary | ICD-10-CM

## 2015-09-02 DIAGNOSIS — D649 Anemia, unspecified: Secondary | ICD-10-CM

## 2015-09-02 DIAGNOSIS — D6489 Other specified anemias: Secondary | ICD-10-CM

## 2015-09-02 LAB — CBC
HCT: 28.4 % — ABNORMAL LOW (ref 39.0–52.0)
HEMOGLOBIN: 9.4 g/dL — AB (ref 13.0–17.0)
MCH: 28.7 pg (ref 26.0–34.0)
MCHC: 33.1 g/dL (ref 30.0–36.0)
MCV: 86.9 fL (ref 78.0–100.0)
MPV: 10.5 fL (ref 8.6–12.4)
PLATELETS: 197 10*3/uL (ref 150–400)
RBC: 3.27 MIL/uL — AB (ref 4.22–5.81)
RDW: 16.4 % — ABNORMAL HIGH (ref 11.5–15.5)
WBC: 7 10*3/uL (ref 4.0–10.5)

## 2015-09-02 NOTE — Telephone Encounter (Signed)
Patient needs trilyte 

## 2015-09-02 NOTE — Patient Instructions (Signed)
Colonoscopy.  The risks and benefits such as perforation, bleeding, and infection were reviewed with the patient and is agreeable. 

## 2015-09-02 NOTE — Progress Notes (Addendum)
Subjective:    Patient ID: Stephen [name], male    DOB: [birth year], 74 y.o.   MRN: [id number]  HPI Referred by Dr. [name] for anemia. Recent hemoglobin in October revealed at 8.7. Recently started on Iron 325mg  in October. Appears he has hx of same. CBC 07/21/2007: hemoglobin 10.1. He denies seeing any blood in his stool. His stools are black since starting taking the Iron. He says his stools were brown before starting the Iron.  He has never undergone a colonoscopy in the past. No family hx of colon cancer. Hx significant for cardiac disease and CVA. Maintained on ASA but on hold at this time. His appetite is good. No weight loss. No abdominal pain. He usually has a BM daily.  BS run about 135 daily Acid reflux controlled with Prevacid. Recent hemoccult by Dr. [name] was negative.  HA1C 7.0 07/17/2015 Iron 34, Saturation 12, Ferritin 297, Vitamin B 12  254, Folate 11.9, Hemoglobin 8.7      CBC    Component Value Date/Time   WBC 6.8 07/11/2015 0835   RBC 2.99* 07/11/2015 0835   HGB 8.7* 07/11/2015 0835   HCT 27.6* 07/11/2015 0835   PLT 212 07/11/2015 0835   MCV 92.3 07/11/2015 0835   MCH 29.1 07/11/2015 0835   MCHC 31.5 07/11/2015 0835   RDW 15.3 07/11/2015 0835   LYMPHSABS 1.8 07/09/2015 1548   MONOABS 0.6 07/09/2015 1548   EOSABS 0.3 07/09/2015 1548   BASOSABS 0.1 07/09/2015 1548    CBC Latest Ref Rng 07/11/2015 07/09/2015 07/08/2008  WBC 4.0 - 10.5 K/uL 6.8 6.9 7.9 WHITE COUNT CONFIRMED ON SMEAR  Hemoglobin 13.0 - 17.0 g/dL 8.7(L) 8.3(L) 11.9(L)  Hematocrit 39.0 - 52.0 % 27.6(L) 26.3(L) 33.9(L)  Platelets 150 - 400 K/uL 212 185 206 LARGE PLATELETS PRESENT        Review of Systems Past Medical History  Diagnosis Date  . Diabetes mellitus type II   . Essential hypertension, benign   . Hyperlipidemia   . Cerebrovascular disease     H/o CVA In 2001 followed by a right carotid endarterectomy; left carotid endarterectomy in 2008  . Coronary atherosclerosis of  native coronary artery     PTCA of LAD in 1994; total obstruction of the RCA treated medically; subsequent stress nuclear study with inferior ischemia  . Peripheral vascular disease ([place])   . DDD (degenerative disc disease), lumbar     Lumbar surgery in 1984  . Carotid artery occlusion   . Stroke [facility]) 2001    Right brain    Past Surgical History  Procedure Laterality Date  . Lumbar disc surgery  2000, 2001    L5 HNP required 2 surgical procedures  . Carotid endarterectomy  2008    Left   . Carotid endarterectomy  06/15/2000    Right  . Appendectomy  1970    No Known Allergies  Current Outpatient Prescriptions on File Prior to Visit  Medication Sig Dispense Refill  . amLODipine (NORVASC) 5 MG tablet Take 5 mg by mouth daily.    Marland Kitchen atenolol (TENORMIN) 50 MG tablet Take 50 mg by mouth daily.      . ferrous sulfate 324 (65 FE) MG TBEC Take 1 tablet (324 mg total) by mouth daily. 30 tablet 11  . furosemide (LASIX) 40 MG tablet Take 1 tablet (40 mg total) by mouth daily. 90 tablet 3  . insulin aspart (NOVOLOG) 100 UNIT/ML injection Inject 25 Units into the skin 2 (two) times daily.     Marland Kitchen  Lansoprazole (PREVACID PO) Take by mouth as needed.    Marland Kitchen losartan (COZAAR) 25 MG tablet Take 25 mg by mouth daily.    . Magnesium Oxide 400 MG CAPS Take 1 capsule (400 mg total) by mouth 2 (two) times daily. 60 capsule 3  . potassium chloride SA (KLOR-CON M20) 20 MEQ tablet Take 1 tablet (20 mEq total) by mouth daily. (Patient taking differently: Take 20 mEq by mouth once. ) 45 tablet 3  . pravastatin (PRAVACHOL) 40 MG tablet Take 40 mg by mouth daily.    . insulin glargine (LANTUS) 100 UNIT/ML injection Inject 65 Units into the skin at bedtime.      No current facility-administered medications on file prior to visit.        Objective:   Physical ExamBlood pressure 112/60, pulse 60, temperature 98 F (36.7 C), height 6' (1.829 m), weight 236 lb (107.049 kg).  Alert and oriented. Skin warm and  dry. Oral mucosa is moist.   . Sclera anicteric, conjunctivae is pink. Thyroid not enlarged. No cervical lymphadenopathy. Lungs clear. Heart regular rate and rhythm.  Abdomen is soft. Bowel sounds are positive. No hepatomegaly. No abdominal masses felt. No tenderness.  No edema to lower extremities. Stool brown and guaiac negative.   Lot [id number] Ex 9/17      Assessment & Plan:  Anemia. Stools are negative for blood. Ferritin normal. Iron low. Patient has never undergone a colonoscopy in the past. Colonic neoplasm needs to be ruled out. Colonoscopy. The risks and benefits such as perforation, bleeding, and infection were reviewed with the patient and is agreeable.

## 2015-09-03 MED ORDER — PEG 3350-KCL-NA BICARB-NACL 420 G PO SOLR: 4000.0000 mL | Freq: Once | ORAL | Status: DC

## 2015-09-04 ENCOUNTER — Telehealth (INDEPENDENT_AMBULATORY_CARE_PROVIDER_SITE_OTHER): Payer: Self-pay | Admitting: *Deleted

## 2015-09-04 NOTE — Telephone Encounter (Signed)
Message left at home 

## 2015-09-04 NOTE — Telephone Encounter (Signed)
Lab results

## 2015-09-05 NOTE — Telephone Encounter (Signed)
Results have been given to patient 

## 2015-10-10 ENCOUNTER — Encounter (HOSPITAL_COMMUNITY)
Admission: RE | Disposition: A | Discharge status: Home / Self Care | Payer: Self-pay | Source: Ambulatory Visit | Attending: Internal Medicine

## 2015-10-10 ENCOUNTER — Encounter (HOSPITAL_COMMUNITY): Payer: Self-pay | Admitting: *Deleted

## 2015-10-10 ENCOUNTER — Ambulatory Visit (HOSPITAL_COMMUNITY)
Admission: RE | Admit: 2015-10-10 | Discharge: 2015-10-10 | Disposition: A | Discharge status: Home / Self Care | Payer: PPO | Source: Ambulatory Visit | Attending: Internal Medicine | Admitting: Internal Medicine

## 2015-10-10 DIAGNOSIS — D649 Anemia, unspecified: Secondary | ICD-10-CM | POA: Insufficient documentation

## 2015-10-10 DIAGNOSIS — Z79899 Other long term (current) drug therapy: Secondary | ICD-10-CM | POA: Diagnosis not present

## 2015-10-10 DIAGNOSIS — I2510 Atherosclerotic heart disease of native coronary artery without angina pectoris: Secondary | ICD-10-CM | POA: Diagnosis not present

## 2015-10-10 DIAGNOSIS — E785 Hyperlipidemia, unspecified: Secondary | ICD-10-CM | POA: Insufficient documentation

## 2015-10-10 DIAGNOSIS — D6489 Other specified anemias: Secondary | ICD-10-CM | POA: Diagnosis not present

## 2015-10-10 DIAGNOSIS — K5730 Diverticulosis of large intestine without perforation or abscess without bleeding: Secondary | ICD-10-CM | POA: Insufficient documentation

## 2015-10-10 DIAGNOSIS — Z1211 Encounter for screening for malignant neoplasm of colon: Secondary | ICD-10-CM | POA: Insufficient documentation

## 2015-10-10 DIAGNOSIS — Z87891 Personal history of nicotine dependence: Secondary | ICD-10-CM | POA: Diagnosis not present

## 2015-10-10 DIAGNOSIS — I10 Essential (primary) hypertension: Secondary | ICD-10-CM | POA: Insufficient documentation

## 2015-10-10 DIAGNOSIS — Z794 Long term (current) use of insulin: Secondary | ICD-10-CM | POA: Diagnosis not present

## 2015-10-10 DIAGNOSIS — Z8673 Personal history of transient ischemic attack (TIA), and cerebral infarction without residual deficits: Secondary | ICD-10-CM | POA: Diagnosis not present

## 2015-10-10 DIAGNOSIS — E119 Type 2 diabetes mellitus without complications: Secondary | ICD-10-CM | POA: Diagnosis not present

## 2015-10-10 DIAGNOSIS — Z7982 Long term (current) use of aspirin: Secondary | ICD-10-CM | POA: Diagnosis not present

## 2015-10-10 HISTORY — PX: COLONOSCOPY: SHX5424

## 2015-10-10 LAB — GLUCOSE, CAPILLARY: GLUCOSE-CAPILLARY: 142 mg/dL — AB (ref 65–99)

## 2015-10-10 SURGERY — COLONOSCOPY
Anesthesia: Moderate Sedation

## 2015-10-10 MED ORDER — MEPERIDINE HCL 50 MG/ML IJ SOLN
INTRAMUSCULAR | Status: AC
  Filled 2015-10-10: qty 1

## 2015-10-10 MED ORDER — MEPERIDINE HCL 50 MG/ML IJ SOLN
INTRAMUSCULAR | Status: DC | PRN
  Administered 2015-10-10 (×2): 25 mg via INTRAVENOUS

## 2015-10-10 MED ORDER — MIDAZOLAM HCL 5 MG/5ML IJ SOLN
INTRAMUSCULAR | Status: AC
  Filled 2015-10-10: qty 10

## 2015-10-10 MED ORDER — MIDAZOLAM HCL 5 MG/5ML IJ SOLN
INTRAMUSCULAR | Status: DC | PRN
  Administered 2015-10-10 (×2): 2 mg via INTRAVENOUS

## 2015-10-10 MED ORDER — SODIUM CHLORIDE 0.9 % IV SOLN
INTRAVENOUS | Status: DC
  Administered 2015-10-10: 11:00:00 via INTRAVENOUS

## 2015-10-10 MED ORDER — SIMETHICONE 40 MG/0.6ML PO SUSP
ORAL | Status: DC | PRN
  Administered 2015-10-10: 11:00:00

## 2015-10-10 NOTE — Discharge Instructions (Signed)
Resume usual medications and high fiber diet. No driving for 24 hours.   High-Fiber Diet Fiber, also called dietary fiber, is a type of carbohydrate found in fruits, vegetables, whole grains, and beans. A high-fiber diet can have many health benefits. Your health care provider may recommend a high-fiber diet to help:  Prevent constipation. Fiber can make your bowel movements more regular.  Lower your cholesterol.  Relieve hemorrhoids, uncomplicated diverticulosis, or irritable bowel syndrome.  Prevent overeating as part of a weight-loss plan.  Prevent heart disease, type 2 diabetes, and certain cancers. WHAT IS MY PLAN? The recommended daily intake of fiber includes:  38 grams for men under age 54.  61 grams for men over age 16.  72 grams for women under age 70.  71 grams for women over age 64. You can get the recommended daily intake of dietary fiber by eating a variety of fruits, vegetables, grains, and beans. Your health care provider may also recommend a fiber supplement if it is not possible to get enough fiber through your diet. WHAT DO I NEED TO KNOW ABOUT A HIGH-FIBER DIET?  Fiber supplements have not been widely studied for their effectiveness, so it is better to get fiber through food sources.  Always check the fiber content on thenutrition facts label of any prepackaged food. Look for foods that contain at least 5 grams of fiber per serving.  Ask your dietitian if you have questions about specific foods that are related to your condition, especially if those foods are not listed in the following section.  Increase your daily fiber consumption gradually. Increasing your intake of dietary fiber too quickly may cause bloating, cramping, or gas.  Drink plenty of water. Water helps you to digest fiber. WHAT FOODS CAN I EAT? Grains Whole-grain breads. Multigrain cereal. Oats and oatmeal. Brown rice. Barley. Bulgur wheat. [place]. Bran muffins. Popcorn. Rye wafer  crackers. Vegetables Sweet potatoes. Spinach. Kale. Artichokes. Cabbage. Broccoli. Green peas. Carrots. Squash. Fruits Berries. Pears. Apples. Oranges. Avocados. Prunes and raisins. Dried figs. Meats and Other Protein Sources [place], kidney, pinto, and soy beans. Split peas. Lentils. Nuts and seeds. Dairy Fiber-fortified yogurt. Beverages Fiber-fortified soy milk. Fiber-fortified orange juice. Other Fiber bars. The items listed above may not be a complete list of recommended foods or beverages. Contact your dietitian for more options. WHAT FOODS ARE NOT RECOMMENDED? Grains White bread. Pasta made with refined flour. White rice. Vegetables Fried potatoes. Canned vegetables. Well-cooked vegetables.  Fruits Fruit juice. Cooked, strained fruit. Meats and Other Protein Sources Fatty cuts of meat. Fried Sales executive or fried fish. Dairy Milk. Yogurt. Cream cheese. Sour cream. Beverages Soft drinks. Other Cakes and pastries. Butter and oils. The items listed above may not be a complete list of foods and beverages to avoid. Contact your dietitian for more information. WHAT ARE SOME TIPS FOR INCLUDING HIGH-FIBER FOODS IN MY DIET?  Eat a wide variety of high-fiber foods.  Make sure that half of all grains consumed each day are whole grains.  Replace breads and cereals made from refined flour or white flour with whole-grain breads and cereals.  Replace white rice with brown rice, bulgur wheat, or millet.  Start the day with a breakfast that is high in fiber, such as a cereal that contains at least 5 grams of fiber per serving.  Use beans in place of meat in soups, salads, or pasta.  Eat high-fiber snacks, such as berries, raw vegetables, nuts, or popcorn.   This information is not intended to  replace advice given to you by your health care provider. Make sure you discuss any questions you have with your health care provider.   Document Released: 09/07/2005 Document Revised: 09/28/2014  Document Reviewed: 02/20/2014 Elsevier Interactive Patient Education 2016 Elsevier Inc. Colonoscopy, Care After Refer to this sheet in the next few weeks. These instructions provide you with information on caring for yourself after your procedure. Your health care provider may also give you more specific instructions. Your treatment has been planned according to current medical practices, but problems sometimes occur. Call your health care provider if you have any problems or questions after your procedure. WHAT TO EXPECT AFTER THE PROCEDURE  After your procedure, it is typical to have the following:  A small amount of blood in your stool.  Moderate amounts of gas and mild abdominal cramping or bloating. HOME CARE INSTRUCTIONS  Do not drive, operate machinery, or sign important documents for 24 hours.  You may shower and resume your regular physical activities, but move at a slower pace for the first 24 hours.  Take frequent rest periods for the first 24 hours.  Walk around or put a warm pack on your abdomen to help reduce abdominal cramping and bloating.  Drink enough fluids to keep your urine clear or pale yellow.  You may resume your normal diet as instructed by your health care provider. Avoid heavy or fried foods that are hard to digest.  Avoid drinking alcohol for 24 hours or as instructed by your health care provider.  Only take over-the-counter or prescription medicines as directed by your health care provider.  If a tissue sample (biopsy) was taken during your procedure:  Do not take aspirin or blood thinners for 7 days, or as instructed by your health care provider.  Do not drink alcohol for 7 days, or as instructed by your health care provider.  Eat soft foods for the first 24 hours. SEEK MEDICAL CARE IF: You have persistent spotting of blood in your stool 2-3 days after the procedure. SEEK IMMEDIATE MEDICAL CARE IF:  You have more than a small spotting of blood in  your stool.  You pass large blood clots in your stool.  Your abdomen is swollen (distended).  You have nausea or vomiting.  You have a fever.  You have increasing abdominal pain that is not relieved with medicine.   This information is not intended to replace advice given to you by your health care provider. Make sure you discuss any questions you have with your health care provider.   Document Released: 04/21/2004 Document Revised: 06/28/2013 Document Reviewed: 05/15/2013 Elsevier Interactive Patient Education 2016 Elsevier Inc. Celiac Disease Celiac disease is an allergy to the protein that is called gluten. When a person with celiac disease eats a food that has gluten in it, his or her natural defense system (immune system) attacks the cells that line the small intestine. Over time, this reaction damages the small intestine and makes the small intestine unable to absorb nutrients from food. Gluten is found in wheat, rye, and barley and in foods like pasta, pizza, and cereal. Celiac disease is also known as celiac sprue, nontropical sprue, and gluten-sensitive enteropathy. CAUSES This condition is caused by a gene that is passed down through families (inherited). RISK FACTORS This condition is more likely to develop in people who have a family member with the disease. SYMPTOMS Symptoms of this condition include:  Recurring bloating and pain in the abdomen.  Gas.  Long-term (chronic) diarrhea.  Pale, bad-smelling, greasy, or oily stool.  Weight loss.  Missed menstrual periods.  Weakening bones (osteoporosis).  Fatigue and weakness.  Tingling or other signs of nerve damage.  Depression.  Poor appetite.  Rash. In some cases, there are no symptoms. DIAGNOSIS This condition is diagnosed with a physical exam and tests. Tests may include:  Blood tests to check for nutritional deficiencies.  Blood tests to look for evidence that the body is attacking cells in the  small intestine.  A test in which a sample of tissue is taken from the small bowel and examined under a microscope (biopsy).  X-rays of the bowel.  Stool tests.  Tests to check for nutrient absorption from the intestine. TREATMENT There is no cure for this condition, but it can be managed with a gluten-free diet. Treatment may also involve avoiding dairy foods, such as milk and cheese, because they are hard to digest. Most people who follow a gluten-free diet feel better and stop having symptoms. The intestine usually heals within 3 months to 2 years. In a small percentage of people, this condition does not improve on the gluten-free diet. If your condition does not improve, more tests will be done. You will also need to work with a specialist in celiac disease to find the best treatment for you. HOME CARE INSTRUCTIONS  Follow instructions from your health care provider about diet.  Monitor your body's response to the gluten-free diet. Write down any changes in your symptoms and changes in how you feel.  If you decide to eat outside of the home, prepare your meal ahead of time, or make sure that the place where you are going has gluten-free options.  Keep all follow-up visits as told by your health care provider. This is important.  Suggest to family members that they get screened for early signs of the disease. SEEK MEDICAL CARE IF:  You continue to have symptoms, even when you are eating a gluten-free diet.  You have trouble sticking to the gluten-free diet.  You develop an itchy rash with groups of tiny blisters.  You develop severe weakness.  You develop balance problems.  You develop new symptoms.   This information is not intended to replace advice given to you by your health care provider. Make sure you discuss any questions you have with your health care provider.   Document Released: 09/07/2005 Document Revised: 05/29/2015 Document Reviewed: 12/31/2014 Elsevier  Interactive Patient Education [employer].

## 2015-10-10 NOTE — H&P (Addendum)
BLY BAE is an 75 y.o. male.   Chief Complaint: Patient is here for colonoscopy. HPI: Patient is 75 year old Caucasian male who is in for colonoscopy primarily for screening purposes. This is his first exam. He was noted to have dropped his hemoglobin but he's never experienced melena or rectal bleeding. Iron studies revealed low iron and saturation serum ferritin was normal. He denies heartburn dysphagia nausea vomiting abdominal pain or diarrhea. His stools turned black after he started taking by mouth iron. Family history is negative for CRC.  Past Medical History  Diagnosis Date  . Diabetes mellitus type II   . Essential hypertension, benign   . Hyperlipidemia   . Cerebrovascular disease     H/o CVA In 2001 followed by a right carotid endarterectomy; left carotid endarterectomy in 2008  . Coronary atherosclerosis of native coronary artery     PTCA of LAD in 1994; total obstruction of the RCA treated medically; subsequent stress nuclear study with inferior ischemia  . Peripheral vascular disease ([place])   . DDD (degenerative disc disease), lumbar     Lumbar surgery in 1984  . Carotid artery occlusion   . Stroke [facility]) 2001    Right brain    Past Surgical History  Procedure Laterality Date  . Lumbar disc surgery  2000, 2001    L5 HNP required 2 surgical procedures  . Carotid endarterectomy  2008    Left   . Carotid endarterectomy  06/15/2000    Right  . Appendectomy  1970    Family History  Problem Relation Age of Onset  . Heart disease Mother     Before age 47  . Diabetes Mother   . Heart attack Mother   . Diabetes Sister   . Heart disease Brother   . Diabetes Brother   . Diabetes Daughter   . Diabetes Son   . Hyperlipidemia Son    Social History:  reports that he quit smoking about 43 years ago. His smoking use included Cigarettes. He has a 30 pack-year smoking history. He has never used smokeless tobacco. He reports that he does not drink alcohol or use illicit  drugs.  Allergies: No Known Allergies  Medications Prior to Admission  Medication Sig Dispense Refill  . acetaminophen (TYLENOL) 500 MG tablet Take 500 mg by mouth every 6 (six) hours as needed.    Marland Kitchen amLODipine (NORVASC) 5 MG tablet Take 5 mg by mouth daily.    Marland Kitchen aspirin 81 MG tablet Take 81 mg by mouth daily.    Marland Kitchen atenolol (TENORMIN) 50 MG tablet Take 50 mg by mouth daily.      Marland Kitchen doxazosin (CARDURA) 2 MG tablet Take 2 mg by mouth daily.    . ferrous sulfate 324 (65 FE) MG TBEC Take 1 tablet (324 mg total) by mouth daily. 30 tablet 11  . furosemide (LASIX) 40 MG tablet Take 1 tablet (40 mg total) by mouth daily. 90 tablet 3  . insulin aspart (NOVOLOG) 100 UNIT/ML injection Inject 25 Units into the skin 2 (two) times daily.     . insulin detemir (LEVEMIR) 100 UNIT/ML injection Inject 65 Units into the skin at bedtime.    . Lansoprazole (PREVACID PO) Take by mouth as needed.    Marland Kitchen losartan (COZAAR) 25 MG tablet Take 25 mg by mouth daily.    . Magnesium Oxide 400 MG CAPS Take 1 capsule (400 mg total) by mouth 2 (two) times daily. 60 capsule 3  . polyethylene glycol-electrolytes (NULYTELY/GOLYTELY) 420  G solution Take 4,000 mLs by mouth once. 4000 mL 0  . potassium chloride SA (KLOR-CON M20) 20 MEQ tablet Take 1 tablet (20 mEq total) by mouth daily. (Patient taking differently: Take 20 mEq by mouth once. ) 45 tablet 3  . pravastatin (PRAVACHOL) 40 MG tablet Take 40 mg by mouth daily.      Results for orders placed or performed during the hospital encounter of 10/10/15 (from the past 48 hour(s))  Glucose, capillary     Status: Abnormal   Collection Time: 10/10/15 10:39 AM  Result Value Ref Range   Glucose-Capillary 142 (H) 65 - 99 mg/dL   No results found.  ROS  Blood pressure 144/123, pulse 76, temperature 98.1 F (36.7 C), temperature source Oral, resp. rate 19, height 6' (1.829 m), weight 232 lb (105.235 kg), SpO2 99 %. Physical Exam  Constitutional: He appears well-developed and  well-nourished.  HENT:  Mouth/Throat: Oropharynx is clear and moist.  Eyes: Conjunctivae are normal. No scleral icterus.  Neck: No thyromegaly present.  Cardiovascular: Normal rate and regular rhythm.   Murmur heard. Respiratory: Effort normal and breath sounds normal.  GI: Soft. He exhibits no distension and no mass. There is no tenderness.  Musculoskeletal: He exhibits no edema.  Lymphadenopathy:    He has no cervical adenopathy.  Neurological: He is alert.  Skin: Skin is warm and dry.     Assessment/Plan Average risk screening colonoscopy. Anemia appears to be of chronic disease.  REHMAN,NAJEEB U 10/10/2015, 11:14 AM

## 2015-10-10 NOTE — Op Note (Addendum)
COLONOSCOPY PROCEDURE REPORT  PATIENT:  Stephen [name]  MR#:  [id number] Birthdate:  23-Mar-1941, 75 y.o., male Endoscopist:  Dr. Rogene Houston, MD Referred By:  Dr. [name], MD Procedure Date: 10/10/2015  Procedure:   Colonoscopy  Indications:  Patient is 75 year old Caucasian male with multiple medical problems who is undergoing colonoscopy primarily for screening purposes. He has chronic anemia and hemoglobin was noted to be low or than his baseline. However his stool remains guaiac negative and he denies melena or rectal bleeding. He also denies abdominal pain diarrhea nausea vomiting heartburn or dysphagia. This is patient's first exam. Iron studies revealed low serum iron and saturation but normal serum ferritin.  Informed Consent:  The procedure and risks were reviewed with the patient and informed consent was obtained.  Medications:  Demerol 50 mg IV Versed 4 mg IV  First dose administered at 11:19 AM Last dose administered at 11:22 AM Scope out 11:43 AM  Description of procedure:  After a digital rectal exam was performed, that colonoscope was advanced from the anus through the rectum and colon to the area of the cecum, ileocecal valve and appendiceal orifice. The cecum was deeply intubated. These structures were well-seen and photographed for the record. From the level of the cecum and ileocecal valve, the scope was slowly and cautiously withdrawn. The mucosal surfaces were carefully surveyed utilizing scope tip to flexion to facilitate fold flattening as needed. The scope was pulled down into the rectum where a thorough exam including retroflexion was performed.  Findings:   Prep satisfactory. Normal mucosa of cecum, ascending colon, hepatic flexure, transverse colon, splenic flexure and descending colon. Few small diverticula at sigmoid colon. Normal rectal mucosa and anorectal junction.    Therapeutic/Diagnostic Maneuvers Performed:   None  Complications:   None  EBL: None  Cecal Withdrawal Time:  9  minutes  Impression:  Normal colonoscopy except mild sigmoid colon diverticulosis.  Recommendations:  Standard instructions given. He is to have follow-up CBC by Dr. [name] later this month.  [name]  10/10/2015 11:43 AM  CC: Dr. [name], MD & Dr. Rayne Du ref. provider found

## 2015-10-14 ENCOUNTER — Encounter (HOSPITAL_COMMUNITY): Payer: Self-pay | Admitting: Internal Medicine

## 2015-10-15 DIAGNOSIS — N183 Chronic kidney disease, stage 3 (moderate): Secondary | ICD-10-CM | POA: Diagnosis not present

## 2015-10-15 DIAGNOSIS — Z79899 Other long term (current) drug therapy: Secondary | ICD-10-CM | POA: Diagnosis not present

## 2015-10-15 DIAGNOSIS — I6789 Other cerebrovascular disease: Secondary | ICD-10-CM | POA: Diagnosis not present

## 2015-10-15 DIAGNOSIS — E119 Type 2 diabetes mellitus without complications: Secondary | ICD-10-CM | POA: Diagnosis not present

## 2015-10-22 DIAGNOSIS — N183 Chronic kidney disease, stage 3 (moderate): Secondary | ICD-10-CM | POA: Diagnosis not present

## 2015-10-22 DIAGNOSIS — D631 Anemia in chronic kidney disease: Secondary | ICD-10-CM | POA: Diagnosis not present

## 2015-10-22 DIAGNOSIS — Z6833 Body mass index (BMI) 33.0-33.9, adult: Secondary | ICD-10-CM | POA: Diagnosis not present

## 2015-10-22 DIAGNOSIS — E1129 Type 2 diabetes mellitus with other diabetic kidney complication: Secondary | ICD-10-CM | POA: Diagnosis not present

## 2015-11-09 ENCOUNTER — Other Ambulatory Visit: Payer: Self-pay | Admitting: Adult Health

## 2015-11-13 ENCOUNTER — Encounter: Payer: Self-pay | Admitting: Cardiology

## 2015-11-13 ENCOUNTER — Ambulatory Visit (INDEPENDENT_AMBULATORY_CARE_PROVIDER_SITE_OTHER): Payer: PPO | Admitting: Cardiology

## 2015-11-13 VITALS — BP 110/68 | HR 60 | Ht 72.0 in | Wt 238.0 lb

## 2015-11-13 DIAGNOSIS — I10 Essential (primary) hypertension: Secondary | ICD-10-CM | POA: Diagnosis not present

## 2015-11-13 DIAGNOSIS — I2510 Atherosclerotic heart disease of native coronary artery without angina pectoris: Secondary | ICD-10-CM | POA: Diagnosis not present

## 2015-11-13 DIAGNOSIS — I739 Peripheral vascular disease, unspecified: Secondary | ICD-10-CM | POA: Diagnosis not present

## 2015-11-13 MED ORDER — POTASSIUM CHLORIDE ER 10 MEQ PO TBCR: 10.0000 meq | EXTENDED_RELEASE_TABLET | Freq: Every day | ORAL | Status: DC

## 2015-11-13 NOTE — Progress Notes (Signed)
Cardiology Office Note  Date: 11/13/2015   ID: Stephen [name], Stephen [name] [birth year], MRN [id number]  PCP: [name], MD  Primary Cardiologist: [name], MD   Chief Complaint  Patient presents with  . Coronary Artery Disease    History of Present Illness: Stephen [name] is a 75 y.o. male last seen by Ms. Lawrence NP in October 2016. He presents for a routine follow-up visit. He does not report any progressing angina symptoms and has stable NYHA class II dyspnea. He has been following his weights at home, reports no major fluctuations, he is up a few pounds today. He has been taking Lasix generally at 20 mg daily, sometimes takes a 40 mg.  I went over his medications which are outlined below. Cardiac regimen includes aspirin, Norvasc, Tenormin, Lasix, Cozaar, Pravachol, and potassium supplements.  He continues to follow with Dr. Kellie Simmering for PAD, follow-up carotid Dopplers and lower extremity ABIs pending for later this spring.  I reviewed his cardiac testing from 2015 in 2016. Results are outlined below. We continue observation.  Past Medical History  Diagnosis Date  . Diabetes mellitus type II   . Essential hypertension, benign   . Hyperlipidemia   . Cerebrovascular disease     H/o CVA In 2001 followed by a right carotid endarterectomy; left carotid endarterectomy in 2008  . Coronary atherosclerosis of native coronary artery     PTCA of LAD in 1994; total obstruction of the RCA treated medically; subsequent stress nuclear study with inferior ischemia  . Peripheral vascular disease ([place])   . DDD (degenerative disc disease), lumbar     Lumbar surgery in 1984  . Carotid artery occlusion   . Stroke [facility]) 2001    Right brain    Past Surgical History  Procedure Laterality Date  . Lumbar disc surgery  2000, 2001    L5 HNP required 2 surgical procedures  . Carotid endarterectomy  2008    Left   . Carotid endarterectomy  06/15/2000    Right  . Appendectomy  1970  .  Colonoscopy N/A 10/10/2015    Procedure: COLONOSCOPY;  Surgeon: Rogene Houston, MD;  Location: [facility];  Service: Endoscopy;  Laterality: N/A;  12:25 - moved to 11:15 - Ann to notify    Current Outpatient Prescriptions  Medication Sig Dispense Refill  . acetaminophen (TYLENOL) 500 MG tablet Take 500 mg by mouth every 6 (six) hours as needed.    Marland Kitchen amLODipine (NORVASC) 5 MG tablet Take 5 mg by mouth daily.    Marland Kitchen aspirin 81 MG tablet Take 81 mg by mouth daily.    Marland Kitchen atenolol (TENORMIN) 50 MG tablet Take 50 mg by mouth daily.      Marland Kitchen doxazosin (CARDURA) 2 MG tablet Take 2 mg by mouth daily.    . ferrous sulfate 324 (65 FE) MG TBEC Take 1 tablet (324 mg total) by mouth daily. 30 tablet 11  . furosemide (LASIX) 40 MG tablet Take 1 tablet (40 mg total) by mouth daily. 90 tablet 3  . insulin aspart (NOVOLOG) 100 UNIT/ML injection Inject 25 Units into the skin 2 (two) times daily.     . insulin detemir (LEVEMIR) 100 UNIT/ML injection Inject 65 Units into the skin at bedtime.    . Lansoprazole (PREVACID PO) Take by mouth as needed.    Marland Kitchen losartan (COZAAR) 25 MG tablet Take 25 mg by mouth daily.    . magnesium oxide (MAG-OX) 400 (241.3 Mg) MG tablet TAKE 1 TABLET  BY MOUTH TWICE A DAY 60 tablet 3  . potassium chloride SA (KLOR-CON M20) 20 MEQ tablet Take 1 tablet (20 mEq total) by mouth daily. (Patient taking differently: Take 20 mEq by mouth once. ) 45 tablet 3  . pravastatin (PRAVACHOL) 40 MG tablet Take 40 mg by mouth daily.     No current facility-administered medications for this visit.   Allergies:  Review of patient's allergies indicates no known allergies.   Social History: The patient  reports that he quit smoking about 43 years ago. His smoking use included Cigarettes. He has a 30 pack-year smoking history. He has never used smokeless tobacco. He reports that he does not drink alcohol or use illicit drugs.   ROS:  Please see the history of present illness. Otherwise, complete review of  systems is positive for claudication.  All other systems are reviewed and negative.   Physical Exam: VS:  BP 110/68 mmHg  Pulse 60  Ht 6' (1.829 m)  Wt 238 lb (107.956 kg)  BMI 32.27 kg/m2  SpO2 97%, BMI Body mass index is 32.27 kg/(m^2).  Wt Readings from Last 3 Encounters:  11/13/15 238 lb (107.956 kg)  10/10/15 232 lb (105.235 kg)  09/02/15 236 lb (107.049 kg)    Overweight male, no distress.  HEENT: Conjunctiva and lids normal, oropharynx clear.  Neck: Supple, no elevated JVP or carotid bruits, no thyromegaly.  Lungs: Clear to auscultation, nonlabored breathing at rest.  Cardiac: Regular rate and rhythm with ectopy, no obvious S3, soft systolic murmur without diastolic murmur, no pericardial rub.  Abdomen: Soft, nontender, bowel sounds present, no guarding or rebound.  Extremities: Edema improved, distal pulses diminished..  ECG: I personally reviewed the prior tracing from 07/09/2015 which showed sinus rhythm with frequent PACs and nonspecific ST changes.  Recent Labwork: 07/09/2015: B Natriuretic Peptide 229.0*; Magnesium 1.4* 07/11/2015: BUN 52*; Creatinine, Ser 2.37*; Potassium 5.5*; Sodium 138 09/02/2015: Hemoglobin 9.4*; Platelets 197   Other Studies Reviewed Today:  Echocardiogram 07/10/2015: Study Conclusions  - Left ventricle: The cavity size was normal. Systolic function was normal. The estimated ejection fraction was in the range of 60% to 65%. Features are consistent with a pseudonormal left ventricular filling pattern, with concomitant abnormal relaxation and increased filling pressure (grade 2 diastolic dysfunction). Doppler parameters are consistent with high ventricular filling pressure. Mild posterior wall thickening. - Ventricular septum: The contour showed systolic flattening. These changes are consistent with RV pressure overload. - Aortic valve: Moderate non-coronary cusp thickening, mild thickening of left and right coronary  cusps. - Mitral valve: There was moderate regurgitation. - Left atrium: The atrium was mildly dilated. - Right atrium: The atrium was mildly dilated. - Tricuspid valve: There was moderate regurgitation. - Pulmonary arteries: Systolic pressure was moderately increased. PA peak pressure: 58 mm Hg (S).  Lexiscan Cardiolite 10/04/2013: IMPRESSION: Low risk Lexiscan Cardiolite. No diagnostic ST segment abnormalities were noted. Occasional PACs were seen without arrhythmia. Perfusion imaging is consistent with diaphragmatic attenuation, although inferior wall scar cannot be excluded in the face of a potential wall motion abnormality by gated imaging, overall LVEF 51%. No large ischemic zones are noted. LV volumes are normal.  Assessment and Plan:  1. Symptomatically stable CAD status post previous PTCA of the LAD with known occlusion of the RCA. Plan is to continue medical therapy and observation. Cardiolite from within the last 2 years showed evidence of inferior scar without active ischemia.  2. Essential hypertension, blood pressure is well controlled today.  3. Peripheral arterial  disease and carotid artery disease, followed by Dr. Kellie Simmering.  Current medicines were reviewed with the patient today.  Disposition: FU with me in 6 months.   Signed, Satira Sark, MD, [facility] 11/13/2015 1:39 PM    [facility] at [facility] [address] [place], [place] [address] Phone: [phone]; Fax: [phone]

## 2015-11-13 NOTE — Patient Instructions (Signed)
Medication Instructions:  Decrease potassium to 10 meq daily   Labwork: none  Testing/Procedures: none  Follow-Up: Your physician wants you to follow-up in: 6 months .  You will receive a reminder letter in the mail two months in advance. If you don't receive a letter, please call our office to schedule the follow-up appointment.   Any Other Special Instructions Will Be Listed Below (If Applicable).     If you need a refill on your cardiac medications before your next appointment, please call your pharmacy.

## 2016-01-16 ENCOUNTER — Encounter: Payer: Self-pay | Admitting: Vascular Surgery

## 2016-01-21 ENCOUNTER — Ambulatory Visit (INDEPENDENT_AMBULATORY_CARE_PROVIDER_SITE_OTHER): Payer: PPO | Admitting: Vascular Surgery

## 2016-01-21 ENCOUNTER — Ambulatory Visit (HOSPITAL_COMMUNITY)
Admission: RE | Admit: 2016-01-21 | Discharge: 2016-01-21 | Disposition: A | Discharge status: Home / Self Care | Payer: PPO | Source: Ambulatory Visit | Attending: Vascular Surgery | Admitting: Vascular Surgery

## 2016-01-21 ENCOUNTER — Encounter: Payer: Self-pay | Admitting: Vascular Surgery

## 2016-01-21 ENCOUNTER — Ambulatory Visit (INDEPENDENT_AMBULATORY_CARE_PROVIDER_SITE_OTHER)
Admission: RE | Admit: 2016-01-21 | Discharge: 2016-01-21 | Disposition: A | Discharge status: Home / Self Care | Payer: PPO | Source: Ambulatory Visit | Attending: Vascular Surgery | Admitting: Vascular Surgery

## 2016-01-21 VITALS — BP 138/70 | HR 70 | Temp 97.0°F | Resp 18 | Ht 72.0 in | Wt 237.0 lb

## 2016-01-21 DIAGNOSIS — I739 Peripheral vascular disease, unspecified: Secondary | ICD-10-CM

## 2016-01-21 DIAGNOSIS — I10 Essential (primary) hypertension: Secondary | ICD-10-CM | POA: Insufficient documentation

## 2016-01-21 DIAGNOSIS — E785 Hyperlipidemia, unspecified: Secondary | ICD-10-CM | POA: Diagnosis not present

## 2016-01-21 DIAGNOSIS — Z87891 Personal history of nicotine dependence: Secondary | ICD-10-CM | POA: Diagnosis not present

## 2016-01-21 DIAGNOSIS — E119 Type 2 diabetes mellitus without complications: Secondary | ICD-10-CM | POA: Insufficient documentation

## 2016-01-21 DIAGNOSIS — I6523 Occlusion and stenosis of bilateral carotid arteries: Secondary | ICD-10-CM | POA: Diagnosis not present

## 2016-01-21 NOTE — Progress Notes (Signed)
Subjective:     Patient ID: Stephen [name], male   DOB: [birth year], 75 y.o.   MRN: [id number]  HPI this 75 year old male returns for further follow-up regarding his bilateral carotid occlusive disease and bilateral lower extremity occlusive disease. Patient had right carotid endarterectomy in 2001 and left carotid endarterectomy in 2008 by me. He has done well from both standpoint with no neurologic symptoms. He denies lateralizing weakness, aphasia, amaurosis fugax, diplopia, blurred vision, syncope. He does have bilateral lower extremity claudication which currently allow seem to walk about one half block. He has no  Rest pain, nonhealing ulcers, or numbness in the feet no history of gangrene. He thinks the claudication has gotten slightly worse since his last visit 9 months ago. He takes one aspirin per day.  Past Medical History  Diagnosis Date  . Diabetes mellitus type II   . Essential hypertension, benign   . Hyperlipidemia   . Cerebrovascular disease     H/o CVA In 2001 followed by a right carotid endarterectomy; left carotid endarterectomy in 2008  . Coronary atherosclerosis of native coronary artery     PTCA of LAD in 1994; total obstruction of the RCA treated medically; subsequent stress nuclear study with inferior ischemia  . Peripheral vascular disease ([place])   . DDD (degenerative disc disease), lumbar     Lumbar surgery in 1984  . Carotid artery occlusion   . Stroke [facility]) 2001    Right brain    Social History  Substance Use Topics  . Smoking status: Former Smoker -- 3.00 packs/day for 10 years    Types: Cigarettes    Quit date: 09/21/1972  . Smokeless tobacco: Never Used  . Alcohol Use: No    Family History  Problem Relation Age of Onset  . Heart disease Mother     Before age 73  . Diabetes Mother   . Heart attack Mother   . Diabetes Sister   . Heart disease Brother   . Diabetes Brother   . Diabetes Daughter   . Diabetes Son   . Hyperlipidemia Son     No Known  Allergies   Current outpatient prescriptions:  .  acetaminophen (TYLENOL) 500 MG tablet, Take 500 mg by mouth every 6 (six) hours as needed., Disp: , Rfl:  .  amLODipine (NORVASC) 5 MG tablet, Take 5 mg by mouth daily., Disp: , Rfl:  .  aspirin 81 MG tablet, Take 81 mg by mouth daily., Disp: , Rfl:  .  atenolol (TENORMIN) 50 MG tablet, Take 50 mg by mouth daily.  , Disp: , Rfl:  .  doxazosin (CARDURA) 2 MG tablet, Take 2 mg by mouth daily., Disp: , Rfl:  .  ferrous sulfate 324 (65 FE) MG TBEC, Take 1 tablet (324 mg total) by mouth daily., Disp: 30 tablet, Rfl: 11 .  furosemide (LASIX) 40 MG tablet, Take 1 tablet (40 mg total) by mouth daily., Disp: 90 tablet, Rfl: 3 .  insulin aspart (NOVOLOG) 100 UNIT/ML injection, Inject 25 Units into the skin 2 (two) times daily. , Disp: , Rfl:  .  insulin detemir (LEVEMIR) 100 UNIT/ML injection, Inject 65 Units into the skin at bedtime., Disp: , Rfl:  .  Lansoprazole (PREVACID PO), Take by mouth as needed., Disp: , Rfl:  .  losartan (COZAAR) 25 MG tablet, Take 25 mg by mouth daily., Disp: , Rfl:  .  magnesium oxide (MAG-OX) 400 (241.3 Mg) MG tablet, TAKE 1 TABLET BY MOUTH TWICE A DAY, Disp: 60  tablet, Rfl: 3 .  potassium chloride (KLOR-CON 10) 10 MEQ tablet, Take 1 tablet (10 mEq total) by mouth daily., Disp: 90 tablet, Rfl: 3 .  pravastatin (PRAVACHOL) 40 MG tablet, Take 40 mg by mouth daily., Disp: , Rfl:   Filed Vitals:   01/21/16 1551 01/21/16 1558  BP: 140/80 138/70  Pulse: 70 70  Temp: 97 F (36.1 C)   Resp: 18   Height: 6' (1.829 m)   Weight: 237 lb (107.502 kg)   SpO2: 96%     Body mass index is 32.14 kg/(m^2).           Review of Systems Denies chest pain , dyspnea on exertion, PND, orthopnea, hemoptysis. Does have a history of cardiac stent about 15 years ago but no active symptoms. Also has lumbar spine disease. See history of present illness     Objective:   Physical Exam BP 138/70 mmHg  Pulse 70  Temp(Src) 97 F (36.1  C)  Resp 18  Ht 6' (1.829 m)  Wt 237 lb (107.502 kg)  BMI 32.14 kg/m2  SpO2 96%    Gen.-alert and oriented x3 in no apparent distress HEENT normal for age Lungs no rhonchi or wheezing Cardiovascular regular rhythm no murmurs carotid pulses 3+ palpable no bruits audible Abdomen soft nontender no palpable masses -obese Musculoskeletal free of  major deformities Skin clear -no rashes Neurologic normal Lower extremities  2+ femoral pulses palpable bilaterally. No distal pulses palpable. Both feet are pink and well perfused. No evidence of infection or gangrene.  Today I ordered carotid duplex exam and lower extremity arterial study. Patient has no significant restenosis in his internal carotid arteries -less than 40% on both sides ABIs are relatively stable from previous exam 0.85 in the right DP and 0.68 in the left PT. These are actually slightly higher than previous study on the right about the same on the left.       Assessment:      slightly worsening bilateral calf claudication symptoms likely due to femoral-popliteal occlusive disease -no limb threatening ischemia Stable bilateral carotid endarterectomies with no evidence of significant restenosis and asymptomatic   hypertension Hyperlipidemia Degenerative joint disease  Type 2 diabetes mellitus    Plan:      patient will return in 6 months to see Dr. Servando Snare  Will obtain ABIs and duplex scan of both lower extremities. Patient may desire at that time to undergo angiography to see what options are available depending on his level of claudication.  He will notify us if his symptoms worsen in the interim

## 2016-02-14 DIAGNOSIS — D508 Other iron deficiency anemias: Secondary | ICD-10-CM | POA: Diagnosis not present

## 2016-02-14 DIAGNOSIS — I130 Hypertensive heart and chronic kidney disease with heart failure and stage 1 through stage 4 chronic kidney disease, or unspecified chronic kidney disease: Secondary | ICD-10-CM | POA: Diagnosis not present

## 2016-02-14 DIAGNOSIS — Z79899 Other long term (current) drug therapy: Secondary | ICD-10-CM | POA: Diagnosis not present

## 2016-02-14 DIAGNOSIS — I5032 Chronic diastolic (congestive) heart failure: Secondary | ICD-10-CM | POA: Diagnosis not present

## 2016-02-14 DIAGNOSIS — E118 Type 2 diabetes mellitus with unspecified complications: Secondary | ICD-10-CM | POA: Diagnosis not present

## 2016-02-14 DIAGNOSIS — E875 Hyperkalemia: Secondary | ICD-10-CM | POA: Diagnosis not present

## 2016-02-21 DIAGNOSIS — D631 Anemia in chronic kidney disease: Secondary | ICD-10-CM | POA: Diagnosis not present

## 2016-02-21 DIAGNOSIS — E785 Hyperlipidemia, unspecified: Secondary | ICD-10-CM | POA: Diagnosis not present

## 2016-02-21 DIAGNOSIS — E1129 Type 2 diabetes mellitus with other diabetic kidney complication: Secondary | ICD-10-CM | POA: Diagnosis not present

## 2016-04-30 DIAGNOSIS — M25511 Pain in right shoulder: Secondary | ICD-10-CM | POA: Diagnosis not present

## 2016-04-30 DIAGNOSIS — M170 Bilateral primary osteoarthritis of knee: Secondary | ICD-10-CM | POA: Diagnosis not present

## 2016-04-30 DIAGNOSIS — M25512 Pain in left shoulder: Secondary | ICD-10-CM | POA: Diagnosis not present

## 2016-04-30 DIAGNOSIS — M5137 Other intervertebral disc degeneration, lumbosacral region: Secondary | ICD-10-CM | POA: Diagnosis not present

## 2016-05-14 ENCOUNTER — Telehealth: Payer: Self-pay | Admitting: Cardiology

## 2016-05-14 NOTE — Telephone Encounter (Signed)
Wt 237 today,yesterday 239,day before 240 lbs.Pt 's real complaint is nasal congestion and clear drainage when he lies down.Has used dayquil with little relief.advised to call pcp,but dr Luan Pulling is covering him.pt agreed to do so

## 2016-05-18 DIAGNOSIS — I5023 Acute on chronic systolic (congestive) heart failure: Secondary | ICD-10-CM | POA: Diagnosis not present

## 2016-05-18 DIAGNOSIS — R05 Cough: Secondary | ICD-10-CM | POA: Diagnosis not present

## 2016-05-28 ENCOUNTER — Ambulatory Visit (INDEPENDENT_AMBULATORY_CARE_PROVIDER_SITE_OTHER): Payer: PPO | Admitting: Cardiology

## 2016-05-28 ENCOUNTER — Encounter: Payer: Self-pay | Admitting: Cardiology

## 2016-05-28 VITALS — BP 124/60 | HR 75 | Ht 72.0 in | Wt 236.0 lb

## 2016-05-28 DIAGNOSIS — I5032 Chronic diastolic (congestive) heart failure: Secondary | ICD-10-CM

## 2016-05-28 DIAGNOSIS — I10 Essential (primary) hypertension: Secondary | ICD-10-CM

## 2016-05-28 DIAGNOSIS — I2510 Atherosclerotic heart disease of native coronary artery without angina pectoris: Secondary | ICD-10-CM

## 2016-05-28 DIAGNOSIS — E782 Mixed hyperlipidemia: Secondary | ICD-10-CM

## 2016-05-28 DIAGNOSIS — N183 Chronic kidney disease, stage 3 (moderate): Secondary | ICD-10-CM

## 2016-05-28 NOTE — Progress Notes (Signed)
Cardiology Office Note  Date: 05/28/2016   ID: Stephen [name], DOB 01-29-1941, MRN [id number]  PCP: [name], MD  Primary Cardiologist: [name], MD   Chief Complaint  Patient presents with  . Coronary Artery Disease    History of Present Illness: Stephen [name] is a 75 y.o. male last seen in February. He is here today with his wife for a follow-up visit. He reports having an interval URI, treated with antibiotics and cough suppressant. Otherwise no change in typical dyspnea on exertion which is in the NYHA class II range. He does not report any angina symptoms or palpitations. He has had leg edema and increased his Lasix to 40 mg daily which has been effective, although on a slow scale.  His weight is down 2 pounds from February. I reviewed his ECG today which shows sinus rhythm with PACs.  Current cardiac regimen includes aspirin, Norvasc, atenolol, Lasix, Cozaar, potassium supplements, and Pravachol. Recent lab work showed LDL 46.  Past Medical History:  Diagnosis Date  . Carotid artery occlusion   . Cerebrovascular disease    H/o CVA In 2001 followed by a right carotid endarterectomy; left carotid endarterectomy in 2008  . Coronary atherosclerosis of native coronary artery    PTCA of LAD in 1994; total obstruction of the RCA treated medically; subsequent stress nuclear study with inferior ischemia  . DDD (degenerative disc disease), lumbar    Lumbar surgery in 1984  . Diabetes mellitus type II   . Essential hypertension, benign   . Hyperlipidemia   . Peripheral vascular disease ([place])   . Stroke [facility]) 2001   Right brain    Past Surgical History:  Procedure Laterality Date  . APPENDECTOMY  1970  . CAROTID ENDARTERECTOMY  2008   Left   . CAROTID ENDARTERECTOMY  06/15/2000   Right  . COLONOSCOPY N/A 10/10/2015   Procedure: COLONOSCOPY;  Surgeon: Rogene Houston, MD;  Location[facility];  Service: Endoscopy;  Laterality: N/A;  12:25 - moved to 11:15 - Ann  to notify  . [place] SURGERY  2000, 2001   L5 HNP required 2 surgical procedures    Current Outpatient Prescriptions  Medication Sig Dispense Refill  . acetaminophen (TYLENOL) 500 MG tablet Take 500 mg by mouth every 6 (six) hours as needed.    Marland Kitchen amLODipine (NORVASC) 5 MG tablet Take 5 mg by mouth daily.    Marland Kitchen aspirin 81 MG tablet Take 81 mg by mouth daily.    Marland Kitchen atenolol (TENORMIN) 50 MG tablet Take 50 mg by mouth daily.      Marland Kitchen doxazosin (CARDURA) 2 MG tablet Take 2 mg by mouth daily.    . ferrous sulfate 324 (65 FE) MG TBEC Take 1 tablet (324 mg total) by mouth daily. 30 tablet 11  . furosemide (LASIX) 40 MG tablet Take 1 tablet (40 mg total) by mouth daily. 90 tablet 3  . insulin aspart (NOVOLOG) 100 UNIT/ML injection Inject 25 Units into the skin 2 (two) times daily.     . insulin detemir (LEVEMIR) 100 UNIT/ML injection Inject 65 Units into the skin at bedtime.    . Lansoprazole (PREVACID PO) Take by mouth as needed.    Marland Kitchen losartan (COZAAR) 25 MG tablet Take 25 mg by mouth daily.    . magnesium oxide (MAG-OX) 400 (241.3 Mg) MG tablet TAKE 1 TABLET BY MOUTH TWICE A DAY 60 tablet 3  . potassium chloride (KLOR-CON 10) 10 MEQ tablet Take 1 tablet (  10 mEq total) by mouth daily. 90 tablet 3  . pravastatin (PRAVACHOL) 40 MG tablet Take 40 mg by mouth daily.     No current facility-administered medications for this visit.    Allergies:  Review of patient's allergies indicates no known allergies.   Social History: The patient  reports that he quit smoking about 43 years ago. His smoking use included Cigarettes. He has a 30.00 pack-year smoking history. He has never used smokeless tobacco. He reports that he does not drink alcohol or use drugs.   ROS:  Please see the history of present illness. Otherwise, complete review of systems is positive for none.  All other systems are reviewed and negative.   Physical Exam: VS:  BP 124/60   Pulse 75   Ht 6' (1.829 m)   Wt 236 lb (107 kg)   SpO2  94%   BMI 32.01 kg/m , BMI Body mass index is 32.01 kg/m.  Wt Readings from Last 3 Encounters:  05/28/16 236 lb (107 kg)  01/21/16 237 lb (107.5 kg)  11/13/15 238 lb (108 kg)    Overweight male, no distress.  HEENT: Conjunctiva and lids normal, oropharynx clear.  Neck: Supple, no elevated JVP or carotid bruits, no thyromegaly.  Lungs: Clear to auscultation, nonlabored breathing at rest.  Cardiac: Regular rate and rhythm with ectopy, no obvious S3, soft systolic murmur without diastolic murmur, no pericardial rub.  Abdomen: Soft, nontender, bowel sounds present, no guarding or rebound.  Extremities: 2+ lower leg edema, distal pulses diminished. Skin: Warm and dry. Musculoskeletal: No kyphosis. Neuropsychiatric: Alert and oriented 3, affect appropriate.  ECG: I personally reviewed the tracing from 07/09/2015 which showed sinus rhythm with PACs, nonspecific ST-T wave abnormalities.  Recent Labwork:  May 2017: BUN 35, creatinine 1.7, potassium 5.2, AST 18, ALT 14, hemoglobin 9.9, hematocrit 29.7, platelets 15.5, cholesterol 156, triglycerides 335, HDL 43, LDL 46, hemoglobin A1c 6.4  Other Studies Reviewed Today:  Echocardiogram 07/10/2015: Study Conclusions  - Left ventricle: The cavity size was normal. Systolic function was normal. The estimated ejection fraction was in the range of 60% to 65%. Features are consistent with a pseudonormal left ventricular filling pattern, with concomitant abnormal relaxation and increased filling pressure (grade 2 diastolic dysfunction). Doppler parameters are consistent with high ventricular filling pressure. Mild posterior wall thickening. - Ventricular septum: The contour showed systolic flattening. These changes are consistent with RV pressure overload. - Aortic valve: Moderate non-coronary cusp thickening, mild thickening of left and right coronary cusps. - Mitral valve: There was moderate regurgitation. - Left  atrium: The atrium was mildly dilated. - Right atrium: The atrium was mildly dilated. - Tricuspid valve: There was moderate regurgitation. - Pulmonary arteries: Systolic pressure was moderately increased. PA peak pressure: 58 mm Hg (S).  Lexiscan Cardiolite 10/04/2013: IMPRESSION: Low risk Lexiscan Cardiolite. No diagnostic ST segment abnormalities were noted. Occasional PACs were seen without arrhythmia. Perfusion imaging is consistent with diaphragmatic attenuation, although inferior wall scar cannot be excluded in the face of a potential wall motion abnormality by gated imaging, overall LVEF 51%. No large ischemic zones are noted. LV volumes are normal.  Assessment and Plan:  1. CAD status post remote PTCA of the LAD in 1994 and known occlusion of the RCA that has been managed medically. Cardiolite study from within the last 2 years was overall low risk showing evidence of inferior wall scar. He is not reporting any angina symptoms at this time.  2. Essential hypertension, blood pressure is well controlled  today.  3. Chronic recurring leg edema. Agree with Lasix at 40 mg daily, may need higher dose in the short-term to get things back to baseline. Compression stockings would also be useful. He does have moderate diastolic dysfunction but preserved LVEF by echocardiogram last year.  4. CKD, stage 3, recent creatinine 1.7.  5. Hyperlipidemia, continues on Pravachol with good LDL control.  Current medicines were reviewed with the patient today.   Orders Placed This Encounter  Procedures  . EKG 12-Lead    Disposition: Follow-up with me in 6 months.  Signed, Satira Sark, MD, [facility] 05/28/2016 4:39 PM    [facility] at [facility] [address], [place], [place] [address] Phone: [phone]; Fax: [phone]

## 2016-05-28 NOTE — Patient Instructions (Signed)
Your physician wants you to follow-up in: 6 months Dr McDowell You will receive a reminder letter in the mail two months in advance. If you don't receive a letter, please call our office to schedule the follow-up appointment.    Your physician recommends that you continue on your current medications as directed. Please refer to the Current Medication list given to you today.     Thank you for choosing [facility] !        

## 2016-06-12 ENCOUNTER — Encounter (INDEPENDENT_AMBULATORY_CARE_PROVIDER_SITE_OTHER): Payer: Self-pay

## 2016-06-12 DIAGNOSIS — Z79899 Other long term (current) drug therapy: Secondary | ICD-10-CM | POA: Diagnosis not present

## 2016-06-12 DIAGNOSIS — I6789 Other cerebrovascular disease: Secondary | ICD-10-CM | POA: Diagnosis not present

## 2016-06-12 DIAGNOSIS — I10 Essential (primary) hypertension: Secondary | ICD-10-CM | POA: Diagnosis not present

## 2016-06-12 DIAGNOSIS — I739 Peripheral vascular disease, unspecified: Secondary | ICD-10-CM | POA: Diagnosis not present

## 2016-06-12 DIAGNOSIS — N189 Chronic kidney disease, unspecified: Secondary | ICD-10-CM | POA: Diagnosis not present

## 2016-07-02 DIAGNOSIS — Z23 Encounter for immunization: Secondary | ICD-10-CM | POA: Diagnosis not present

## 2016-07-02 DIAGNOSIS — E1122 Type 2 diabetes mellitus with diabetic chronic kidney disease: Secondary | ICD-10-CM | POA: Diagnosis not present

## 2016-07-02 DIAGNOSIS — D631 Anemia in chronic kidney disease: Secondary | ICD-10-CM | POA: Diagnosis not present

## 2016-07-02 DIAGNOSIS — N183 Chronic kidney disease, stage 3 (moderate): Secondary | ICD-10-CM | POA: Diagnosis not present

## 2016-07-22 ENCOUNTER — Encounter: Payer: Self-pay | Admitting: Vascular Surgery

## 2016-07-23 ENCOUNTER — Other Ambulatory Visit: Payer: Self-pay | Admitting: *Deleted

## 2016-07-23 DIAGNOSIS — I739 Peripheral vascular disease, unspecified: Secondary | ICD-10-CM

## 2016-07-24 ENCOUNTER — Encounter: Payer: Self-pay | Admitting: Vascular Surgery

## 2016-07-24 ENCOUNTER — Ambulatory Visit (HOSPITAL_COMMUNITY)
Admission: RE | Admit: 2016-07-24 | Discharge: 2016-07-24 | Disposition: A | Discharge status: Home / Self Care | Payer: PPO | Source: Ambulatory Visit | Attending: Vascular Surgery | Admitting: Vascular Surgery

## 2016-07-24 ENCOUNTER — Ambulatory Visit (INDEPENDENT_AMBULATORY_CARE_PROVIDER_SITE_OTHER): Payer: PPO | Admitting: Vascular Surgery

## 2016-07-24 ENCOUNTER — Ambulatory Visit (INDEPENDENT_AMBULATORY_CARE_PROVIDER_SITE_OTHER)
Admission: RE | Admit: 2016-07-24 | Discharge: 2016-07-24 | Disposition: A | Discharge status: Home / Self Care | Payer: PPO | Source: Ambulatory Visit | Attending: Vascular Surgery | Admitting: Vascular Surgery

## 2016-07-24 VITALS — BP 132/56 | HR 90 | Temp 96.8°F | Resp 18 | Ht 72.0 in | Wt 235.8 lb

## 2016-07-24 DIAGNOSIS — I739 Peripheral vascular disease, unspecified: Secondary | ICD-10-CM | POA: Diagnosis not present

## 2016-07-24 NOTE — Progress Notes (Signed)
Patient ID: MAGGIE KRIPPNER, male   DOB: 13-Jul-1941, 75 y.o.   MRN: [id number]  Reason for Consult: Follow-up (6 month with ABIs and LE arterial duplex ) and PAD   Referred by [name], MD  Subjective:     HPI:  BRYSHERE MCFERREN is a 75 y.o. male with history of bilateral carotid endarterectomies performed by Dr. Kellie Simmering in the past. Regarding his right carotid endarterectomy he had a stroke that affected his left side with minimal residual weakness on that side. Today he does have weakness of his right arm given a rotator cuff tear. He is here today for evaluation of his bilateral lower extremities she has a known history of claudication for. At this point he can only walk a few minutes prior to having calf pain and it is in the right greater than the left. He also has pain with any standing with a known history of lumbar spine disease. He also has associated swelling of his bilateral lower extremities he does not wear compression stockings for this. He is a former smoker having quit multiple years ago. He also has type 2 diabetes. He does take an aspirin and a statin daily. He has no ulceration or tissue loss of his bilateral lower extremities and denies any rest pain specifically saying his legs feel best when he is resting. He has no new complaints regarding today's evaluation.  Past Medical History:  Diagnosis Date  . Carotid artery occlusion   . Celiac disease   . Cerebrovascular disease    H/o CVA In 2001 followed by a right carotid endarterectomy; left carotid endarterectomy in 2008  . Coronary atherosclerosis of native coronary artery    PTCA of LAD in 1994; total obstruction of the RCA treated medically; subsequent stress nuclear study with inferior ischemia  . DDD (degenerative disc disease), lumbar    Lumbar surgery in 1984  . Diabetes mellitus type II   . Essential hypertension, benign   . Hyperlipidemia   . Peripheral vascular disease ([place])   . Stroke [facility]) 2001   Right  brain   Family History  Problem Relation Age of Onset  . Heart disease Mother     Before age 32  . Diabetes Mother   . Heart attack Mother   . Diabetes Sister   . Heart disease Brother   . Diabetes Brother   . Diabetes Daughter   . Diabetes Son   . Hyperlipidemia Son    Past Surgical History:  Procedure Laterality Date  . APPENDECTOMY  1970  . CAROTID ENDARTERECTOMY  2008   Left   . CAROTID ENDARTERECTOMY  06/15/2000   Right  . COLONOSCOPY N/A 10/10/2015   Procedure: COLONOSCOPY;  Surgeon: Rogene Houston, MD;  Location: [facility];  Service: Endoscopy;  Laterality: N/A;  12:25 - moved to 11:15 - Ann to notify  . [place] SURGERY  2000, 2001   L5 HNP required 2 surgical procedures    Short Social History:  Social History  Substance Use Topics  . Smoking status: Former Smoker    Packs/day: 3.00    Years: 10.00    Types: Cigarettes    Quit date: 09/21/1972  . Smokeless tobacco: Never Used  . Alcohol use No    No Known Allergies  Current Outpatient Prescriptions  Medication Sig Dispense Refill  . acetaminophen (TYLENOL) 500 MG tablet Take 500 mg by mouth every 6 (six) hours as needed.    Marland Kitchen amLODipine (NORVASC) 5 MG  tablet Take 5 mg by mouth daily.    Marland Kitchen aspirin 81 MG tablet Take 81 mg by mouth daily.    Marland Kitchen atenolol (TENORMIN) 50 MG tablet Take 50 mg by mouth daily.      Marland Kitchen doxazosin (CARDURA) 2 MG tablet Take 2 mg by mouth daily.    . furosemide (LASIX) 40 MG tablet Take 1 tablet (40 mg total) by mouth daily. 90 tablet 3  . insulin aspart (NOVOLOG) 100 UNIT/ML injection Inject 25 Units into the skin 2 (two) times daily.     . insulin detemir (LEVEMIR) 100 UNIT/ML injection Inject 65 Units into the skin at bedtime.    . Lansoprazole (PREVACID PO) Take by mouth as needed.    Marland Kitchen losartan (COZAAR) 25 MG tablet Take 25 mg by mouth daily.    . pravastatin (PRAVACHOL) 40 MG tablet Take 40 mg by mouth daily.    . ferrous sulfate 324 (65 FE) MG TBEC Take 1 tablet (324 mg  total) by mouth daily. (Patient not taking: Reported on 07/24/2016) 30 tablet 11  . magnesium oxide (MAG-OX) 400 (241.3 Mg) MG tablet TAKE 1 TABLET BY MOUTH TWICE A DAY (Patient not taking: Reported on 07/24/2016) 60 tablet 3  . potassium chloride (KLOR-CON 10) 10 MEQ tablet Take 1 tablet (10 mEq total) by mouth daily. (Patient not taking: Reported on 07/24/2016) 90 tablet 3   No current facility-administered medications for this visit.     Review of Systems  Constitutional:  Constitutional negative. Eyes: Eyes negative.  Respiratory: Respiratory negative.  Cardiovascular: Positive for leg swelling.  GI: Gastrointestinal negative.  GU: Genitourinary negative. Musculoskeletal: Positive for back pain, gait problem, leg pain and joint pain.       Right arm weakness 2/2 rotator cuff tear Skin: Skin negative.  Neurological: Positive for focal weakness.  Hematologic: Hematologic/lymphatic negative.  Psychiatric: Psychiatric negative.        Objective:  Objective   Vitals:   07/24/16 1147  BP: (!) 132/56  Pulse: 90  Resp: 18  Temp: (!) 96.8 F (36 C)  TempSrc: Oral  SpO2: 99%  Weight: 235 lb 12.8 oz (107 kg)  Height: 6' (1.829 m)   Body mass index is 31.98 kg/m.  Physical Exam  Constitutional: He is oriented to person, place, and time. He appears well-developed.  HENT:  Head: Atraumatic.  Eyes: EOM are normal.  Neck: Normal range of motion. No thyromegaly present.  Cardiovascular: Normal rate.   Pulses:      Femoral pulses are 2+ on the right side, and 2+ on the left side. AT signals bilaterally  Pulmonary/Chest: Effort normal.  Abdominal: Soft. He exhibits mass.  Musculoskeletal: Normal range of motion. He exhibits edema.  Lymphadenopathy:    He has no cervical adenopathy.  Neurological: He is alert and oriented to person, place, and time.  Skin: Skin is warm and dry.  Psychiatric: He has a normal mood and affect. His behavior is normal. Judgment and thought content  normal.    Data: Right and left signals are monophasic with right ABI 0.55 and left 0.48. His bilateral lower extremity duplexes demonstrating bilateral distal SFA disease.     Assessment/Plan:     75 year old white male history of bilateral carotid endarterectomy by Dr. Kellie Simmering. He also has a history of claudication for which we are evaluating with bilateral lotion me duplex and ABIs today. His ABIs are quite depressed with monophasic waveforms at his bilateral ankles. I have counseled him that he has a  low risk for requiring amputation in the future given that he is only a claudication without rest pain or tissue loss at this time. His pain is likely multifactorial with lumbar spinal disease as well as significant edema of his bilateral lower extremities. I have offered him angiogram to evaluate his right leg followed by possibly his left leg at a separate time given his elevated creatinine. At this time however he thinks his swelling and back pain or major contributor and angiogram would not fix his overall condition. I tend to agree with this but should he want aortogram with right lower [place] runoff first he will call to set that up. We will otherwise see him in 6 months with repeat ABIs of his bilateral lower extremities and his carotid duplex which will be due at that time. He will continue aspirin and statin in the meantime.     Waynetta Sandy MD [facility]

## 2016-07-27 ENCOUNTER — Other Ambulatory Visit: Payer: Self-pay | Admitting: Adult Health

## 2016-07-27 DIAGNOSIS — I5031 Acute diastolic (congestive) heart failure: Secondary | ICD-10-CM

## 2016-07-27 DIAGNOSIS — I2510 Atherosclerotic heart disease of native coronary artery without angina pectoris: Secondary | ICD-10-CM

## 2016-07-27 DIAGNOSIS — D509 Iron deficiency anemia, unspecified: Secondary | ICD-10-CM

## 2016-08-03 ENCOUNTER — Other Ambulatory Visit: Payer: Self-pay | Admitting: *Deleted

## 2016-08-03 DIAGNOSIS — I2510 Atherosclerotic heart disease of native coronary artery without angina pectoris: Secondary | ICD-10-CM

## 2016-08-03 DIAGNOSIS — I5031 Acute diastolic (congestive) heart failure: Secondary | ICD-10-CM

## 2016-08-03 MED ORDER — FUROSEMIDE 40 MG PO TABS: ORAL_TABLET | ORAL | 2 refills | Status: DC

## 2016-08-11 ENCOUNTER — Other Ambulatory Visit: Payer: Self-pay

## 2016-08-11 ENCOUNTER — Encounter (HOSPITAL_COMMUNITY): Payer: Self-pay | Admitting: Oncology

## 2016-08-11 ENCOUNTER — Encounter (HOSPITAL_COMMUNITY): Payer: PPO | Attending: Oncology | Admitting: Oncology

## 2016-08-11 ENCOUNTER — Encounter (HOSPITAL_COMMUNITY): Payer: PPO

## 2016-08-11 VITALS — BP 110/35 | HR 62 | Temp 98.0°F | Resp 16 | Ht 71.0 in | Wt 231.0 lb

## 2016-08-11 DIAGNOSIS — N183 Chronic kidney disease, stage 3 unspecified: Secondary | ICD-10-CM

## 2016-08-11 DIAGNOSIS — D649 Anemia, unspecified: Secondary | ICD-10-CM

## 2016-08-11 DIAGNOSIS — E1122 Type 2 diabetes mellitus with diabetic chronic kidney disease: Secondary | ICD-10-CM | POA: Diagnosis not present

## 2016-08-11 DIAGNOSIS — Z87891 Personal history of nicotine dependence: Secondary | ICD-10-CM

## 2016-08-11 DIAGNOSIS — D631 Anemia in chronic kidney disease: Secondary | ICD-10-CM

## 2016-08-11 DIAGNOSIS — I129 Hypertensive chronic kidney disease with stage 1 through stage 4 chronic kidney disease, or unspecified chronic kidney disease: Secondary | ICD-10-CM

## 2016-08-11 LAB — COMPREHENSIVE METABOLIC PANEL
ALT: 14 U/L — AB (ref 17–63)
AST: 20 U/L (ref 15–41)
Albumin: 3.6 g/dL (ref 3.5–5.0)
Alkaline Phosphatase: 66 U/L (ref 38–126)
Anion gap: 8 (ref 5–15)
BUN: 50 mg/dL — ABNORMAL HIGH (ref 6–20)
CHLORIDE: 107 mmol/L (ref 101–111)
CO2: 23 mmol/L (ref 22–32)
CREATININE: 2.18 mg/dL — AB (ref 0.61–1.24)
Calcium: 8.9 mg/dL (ref 8.9–10.3)
GFR, EST AFRICAN AMERICAN: 32 mL/min — AB (ref >60)
GFR, EST NON AFRICAN AMERICAN: 28 mL/min — AB (ref >60)
Glucose, Bld: 112 mg/dL — ABNORMAL HIGH (ref 65–99)
POTASSIUM: 4.9 mmol/L (ref 3.5–5.1)
Sodium: 138 mmol/L (ref 135–145)
Total Bilirubin: 0.4 mg/dL (ref 0.3–1.2)
Total Protein: 7.5 g/dL (ref 6.5–8.1)

## 2016-08-11 LAB — VITAMIN B12: VITAMIN B 12: 176 pg/mL — AB (ref 180–914)

## 2016-08-11 LAB — RETICULOCYTES
RBC.: 2.94 MIL/uL — AB (ref 4.22–5.81)
RETIC COUNT ABSOLUTE: 52.9 10*3/uL (ref 19.0–186.0)
Retic Ct Pct: 1.8 % (ref 0.4–3.1)

## 2016-08-11 LAB — CBC WITH DIFFERENTIAL/PLATELET
Basophils Absolute: 0.1 10*3/uL (ref 0.0–0.1)
Basophils Relative: 1 %
EOS PCT: 2 %
Eosinophils Absolute: 0.1 10*3/uL (ref 0.0–0.7)
HCT: 27.8 % — ABNORMAL LOW (ref 39.0–52.0)
Hemoglobin: 8.6 g/dL — ABNORMAL LOW (ref 13.0–17.0)
LYMPHS ABS: 2 10*3/uL (ref 0.7–4.0)
LYMPHS PCT: 31 %
MCH: 29.3 pg (ref 26.0–34.0)
MCHC: 30.9 g/dL (ref 30.0–36.0)
MCV: 94.6 fL (ref 78.0–100.0)
MONO ABS: 0.6 10*3/uL (ref 0.1–1.0)
MONOS PCT: 10 %
Neutro Abs: 3.8 10*3/uL (ref 1.7–7.7)
Neutrophils Relative %: 56 %
PLATELETS: 167 10*3/uL (ref 150–400)
RBC: 2.94 MIL/uL — ABNORMAL LOW (ref 4.22–5.81)
RDW: 15.4 % (ref 11.5–15.5)
WBC: 6.6 10*3/uL (ref 4.0–10.5)

## 2016-08-11 LAB — FOLATE: FOLATE: 29.9 ng/mL (ref >5.9)

## 2016-08-11 LAB — SEDIMENTATION RATE: Sed Rate: 89 mm/hr — ABNORMAL HIGH (ref 0–16)

## 2016-08-11 LAB — FERRITIN: Ferritin: 134 ng/mL (ref 24–336)

## 2016-08-11 LAB — C-REACTIVE PROTEIN: CRP: 1.1 mg/dL — ABNORMAL HIGH (ref <1.0)

## 2016-08-11 LAB — IRON AND TIBC
Iron: 41 ug/dL — ABNORMAL LOW (ref 45–182)
Saturation Ratios: 12 % — ABNORMAL LOW (ref 17.9–39.5)
TIBC: 342 ug/dL (ref 250–450)
UIBC: 301 ug/dL

## 2016-08-11 LAB — LACTATE DEHYDROGENASE: LDH: 140 U/L (ref 98–192)

## 2016-08-11 NOTE — Patient Instructions (Signed)
[place] at [facility] Discharge Instructions  RECOMMENDATIONS MADE BY THE CONSULTANT AND ANY TEST RESULTS WILL BE SENT TO YOUR REFERRING PHYSICIAN.  You were seen today by [name] PA-C and Dr. Whitney Muse. Labs today, will call with results. Follow up in 2-3 weeks.   Thank you for choosing [place] at [facility] to provide your oncology and hematology care.  To afford each patient quality time with our provider, please arrive at least 15 minutes before your scheduled appointment time.   Beginning January 23rd 2017 lab work for the [facility] will be done in the  Main lab at [facility] on 1st floor. If you have a lab appointment with the [place] please come in thru the  Main Entrance and check in at the main information desk  You need to re-schedule your appointment should you arrive 10 or more minutes late.  We strive to give you quality time with our providers, and arriving late affects you and other patients whose appointments are after yours.  Also, if you no show three or more times for appointments you may be dismissed from the clinic at the providers discretion.     Again, thank you for choosing [facility].  Our hope is that these requests will decrease the amount of time that you wait before being seen by our physicians.       _____________________________________________________________  Should you have questions after your visit to [facility], please contact our office at [phone] between the hours of 8:30 a.m. and 4:30 p.m.  Voicemails left after 4:30 p.m. will not be returned until the following business day.  For prescription refill requests, have your pharmacy contact our office.         Resources For Cancer Patients and their Caregivers ? American Cancer Society: Can assist with transportation, wigs, general needs, runs Look Good Feel Better.        412-052-2620 ? Cancer  Care: Provides financial assistance, online support groups, medication/co-pay assistance.  1-800-813-HOPE (951) 468-4378) ? [place] Assists [place] cancer patients and their families through emotional , educational and financial support.  2096546266 ? [place] DSS Where to apply for food stamps, Medicaid and utility assistance. 714-813-2139 ? [facility]: Transportation to medical appointments. 2148040022 ? Social Security Administration: May apply for disability if have a Stage IV cancer. 425 827 9574 860-580-3320 ? [employer], Disability and Transit Services: Assists with nutrition, care and transit needs. [place] Support Programs: @10RELATIVEDAYS @ > Cancer Support Group  2nd Tuesday of the month 1pm-2pm, Journey Room  > Creative Journey  3rd Tuesday of the month 1130am-1pm, Journey Room  > Look Good Feel Better  1st Wednesday of the month 10am-12 noon, Journey Room (Call [place] to register [phone])

## 2016-08-11 NOTE — Progress Notes (Signed)
[facility] Hematology/Oncology Consultation   Name: Stephen [name]      MRN: [id number]    Date: 08/11/2016 Time:12:16 PM   REFERRING PHYSICIAN:  [name], MD (Primary Care Provider)  REASON FOR CONSULT:  Anemia   DIAGNOSIS:  Normocytic, normochromic anemia  HISTORY OF PRESENT ILLNESS:   Stephen [name] is a 75 y.o. male with a medical history significant for Stage III chronic renal disease, well controlled DM-insulin dependent, HTN, CAD, hyperlipidemia, carotid artery stenosis, CHF, PAD, and PVD, who is referred to the [facility] for normocytic, normochromic anemia.  Results for Stephen [name], Stephen [name] (MRN [id number])   Ref. Range 07/09/2015 15:48 07/11/2015 08:35 09/02/2015 10:24 08/11/2016 12:38  Hemoglobin Latest Ref Range: 13.0 - 17.0 g/dL 8.3 (L) 8.7 (L) 9.4 (L) 8.6 (L)   He reports feeling well other than being cold chronically.   He denies and b symptoms, unintentional weight loss, chest pain, abdominal pain, hemoptysis, hematemesis, epistaxis, blood in stool, dark stools, hematuria.  He is provided education regarding anemia.  He is noted to have a stable, yet significant anemia, dating back long ago.  He reports that he sees Dr. Domenic Polite for his cardiology care and Dr. [name] for his primary care needs.  He denies seeing a nephrologist for his renal disease.    It is reported that his DM is well controlled.  He is compliant with his medications.  He is concerned about the reason for his referral to [place], but after he is provided an explanation as to our role in his care, he was pleased to learn that we are not concerned for malignancy at this time and he is here for a work-up of his anemia.  Colonoscopy on 10/10/2015 with Dr. Laural Golden: Prep satisfactory. Normal mucosa of cecum, ascending colon, hepatic flexure, transverse colon, splenic flexure and descending colon. Few small diverticula at sigmoid colon. Normal rectal mucosa and anorectal  junction.   Review of Systems  Constitutional: Negative for chills, fever and weight loss.  HENT: Negative.  Negative for nosebleeds.   Eyes: Negative.  Negative for blurred vision and double vision.  Respiratory: Negative.  Negative for cough and hemoptysis.   Cardiovascular: Positive for leg swelling. Negative for chest pain and palpitations.  Gastrointestinal: Negative.  Negative for abdominal pain, blood in stool, constipation, diarrhea, melena, nausea and vomiting.  Genitourinary: Negative.  Negative for hematuria.  Musculoskeletal: Negative.   Skin: Negative.   Neurological: Negative.  Negative for weakness.  Endo/Heme/Allergies: Negative.  Does not bruise/bleed easily.  Psychiatric/Behavioral: Negative.   14 point review of systems was performed and is negative except as detailed under history of present illness and above   PAST MEDICAL HISTORY:   Past Medical History:  Diagnosis Date  . Anemia 06/01/2012   H&H of 10/30 in 8/13 with normal MCV   . Carotid artery occlusion   . Celiac disease   . Cerebrovascular disease    H/o CVA In 2001 followed by a right carotid endarterectomy; left carotid endarterectomy in 2008  . Coronary atherosclerosis of native coronary artery    PTCA of LAD in 1994; total obstruction of the RCA treated medically; subsequent stress nuclear study with inferior ischemia  . DDD (degenerative disc disease), lumbar    Lumbar surgery in 1984  . Diabetes mellitus type II   . Essential hypertension, benign   . Hyperlipidemia   . Peripheral vascular disease ([place])   . Stroke [facility]) 2001  Right brain    ALLERGIES: No Known Allergies    MEDICATIONS: I have reviewed the patient's current medications.    Current Outpatient Prescriptions on File Prior to Visit  Medication Sig Dispense Refill  . acetaminophen (TYLENOL) 500 MG tablet Take 500 mg by mouth every 6 (six) hours as needed.    Marland Kitchen amLODipine (NORVASC) 5 MG tablet Take 5 mg by mouth daily.    Marland Kitchen  aspirin 81 MG tablet Take 81 mg by mouth daily.    Marland Kitchen atenolol (TENORMIN) 50 MG tablet Take 50 mg by mouth daily.      Marland Kitchen doxazosin (CARDURA) 2 MG tablet Take 2 mg by mouth daily.    . furosemide (LASIX) 40 MG tablet TAKE 1 AND 1/2 TABLET (60 MG) IN THE AM AND TAKE 1 TABLET (40 MG) IN THE EVENING 225 tablet 2  . insulin aspart (NOVOLOG) 100 UNIT/ML injection Inject 25 Units into the skin 2 (two) times daily.     . insulin detemir (LEVEMIR) 100 UNIT/ML injection Inject 65 Units into the skin at bedtime.    . Lansoprazole (PREVACID PO) Take by mouth as needed.    Marland Kitchen losartan (COZAAR) 25 MG tablet Take 25 mg by mouth daily.    . pravastatin (PRAVACHOL) 40 MG tablet Take 40 mg by mouth daily.     No current facility-administered medications on file prior to visit.      PAST SURGICAL HISTORY Past Surgical History:  Procedure Laterality Date  . APPENDECTOMY  1970  . CAROTID ENDARTERECTOMY  2008   Left   . CAROTID ENDARTERECTOMY  06/15/2000   Right  . COLONOSCOPY N/A 10/10/2015   Procedure: COLONOSCOPY;  Surgeon: Rogene Houston, MD;  Location: [facility];  Service: Endoscopy;  Laterality: N/A;  12:25 - moved to 11:15 - Ann to notify  . [place] SURGERY  2000, 2001   L5 HNP required 2 surgical procedures    FAMILY HISTORY: Family History  Problem Relation Age of Onset  . Heart disease Mother     Before age 56  . Diabetes Mother   . Heart attack Mother   . Diabetes Sister   . Heart disease Brother   . Diabetes Brother   . Diabetes Daughter   . Diabetes Son   . Hyperlipidemia Son    He has 2 children.  1 daughter 22 years old with DM.  1 Sone 47 yo with DM, COPD.  He has 2 grandchildren and 2 great grandchildren (ages 62,13).  SOCIAL HISTORY:  reports that he quit smoking about 43 years ago. His smoking use included Cigarettes. He has a 30.00 pack-year smoking history. He has never used smokeless tobacco. He reports that he does not drink alcohol or use drugs.  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  He is engaged x 3-4 years.  He was previously married x 22 years but his first wife is deceased from complications associated with scleroderma.  Social History   Social History  . Marital status: Widowed    Spouse name: N/A  . Number of children: N/A  . Years of education: N/A   Social History Main Topics  . Smoking status: Former Smoker    Packs/day: 3.00    Years: 10.00    Types: Cigarettes    Quit date: 09/21/1972  . Smokeless tobacco: Never Used  . Alcohol use No  . Drug use: No  . Sexual activity: Yes    Partners:  Male    Birth control/ protection: Post-menopausal   Other Topics Concern  . None   Social History Narrative  . None    PERFORMANCE STATUS: The patient's performance status is 1 - Symptomatic but completely ambulatory  PHYSICAL EXAM: Most Recent Vital Signs: Blood pressure (!) 110/35, pulse 62, temperature 98 F (36.7 C), temperature source Oral, resp. rate 16, height 5' 11"  (1.803 m), weight 231 lb (104.8 kg), SpO2 100 %. General appearance: alert, cooperative, appears stated age, no distress, mildly obese and accompanied by his fiance Head: Normocephalic, without obvious abnormality, atraumatic Eyes: negative findings: lids and lashes normal, conjunctivae and sclerae normal and corneas clear Ears: B/L cerumen impaction without visualization of TM Throat: normal findings: lips normal without lesions and buccal mucosa normal Neck: no adenopathy, supple, symmetrical, trachea midline and thyroid not enlarged, symmetric, no tenderness/mass/nodules Lungs: clear to auscultation bilaterally and normal percussion bilaterally Heart: regular rate and rhythm, S1, S2 normal, no murmur, click, rub or gallop and with ectopy Abdomen: soft, non-tender; bowel sounds normal; no masses,  no organomegaly Extremities: edema B/L LE edema, 2+ pitting without erythema, pain, or heat Pulses: 2+ and symmetric Skin: Skin color, texture,  turgor normal. No rashes or lesions Lymph nodes: Cervical, supraclavicular, and axillary nodes normal. Neurologic: Grossly normal  LABORATORY DATA:  CBC    Component Value Date/Time   WBC 6.6 08/11/2016 1238   RBC 2.94 (L) 08/11/2016 1238   RBC 2.94 (L) 08/11/2016 1238   HGB 8.6 (L) 08/11/2016 1238   HCT 27.8 (L) 08/11/2016 1238   PLT 167 08/11/2016 1238   MCV 94.6 08/11/2016 1238   MCH 29.3 08/11/2016 1238   MCHC 30.9 08/11/2016 1238   RDW 15.4 08/11/2016 1238   LYMPHSABS 2.0 08/11/2016 1238   MONOABS 0.6 08/11/2016 1238   EOSABS 0.1 08/11/2016 1238   BASOSABS 0.1 08/11/2016 1238     Chemistry      Component Value Date/Time   NA 138 08/11/2016 1238   K 4.9 08/11/2016 1238   CL 107 08/11/2016 1238   CO2 23 08/11/2016 1238   BUN 50 (H) 08/11/2016 1238   CREATININE 2.18 (H) 08/11/2016 1238      Component Value Date/Time   CALCIUM 8.9 08/11/2016 1238   ALKPHOS 66 08/11/2016 1238   AST 20 08/11/2016 1238   ALT 14 (L) 08/11/2016 1238   BILITOT 0.4 08/11/2016 1238       RADIOGRAPHY: No results found.     PATHOLOGY:  N/A  ASSESSMENT/PLAN:   Anemia Stage III CKD Normal colonoscopy 09/2015  Normocytic, normochromic anemia in the setting of Stage III chronic renal disease secondary to HTN/DM- insulin dependent (well controlled).  Labs today include a full anemia evaluation with: CBC diff, CMET, LDH, ESR, CRP, anemia panel, haptoglobin, EPO level, retic count, SPEP+IFE, light chain assay, pathology smear review, and stool cards x 3.  I suspect anemia is secondary to chronic renal disease, but given his significant renal dysfunction, will also screen for multiple myeloma with SPEP+IFE/light chain assay.  He will return in 2-3 weeks for follow-up and review of information.  If answer is not found on peripheral work-up, patient is advised that bone marrow aspiration and biopsy may be necessary.  If labs reveal any issues that need to be addressed prior to scheduled  follow-up patient was advised that we would call.   ORDERS PLACED FOR THIS ENCOUNTER: Orders Placed This Encounter  Procedures  . CBC with Differential  . Comprehensive metabolic panel  .  Lactate dehydrogenase  . Sedimentation rate  . Pathologist smear review  . Vitamin B12  . Folate  . Iron and TIBC  . Ferritin  . Erythropoietin  . Haptoglobin  . Reticulocytes  . Kappa/lambda light chains  . IgG, IgA, IgM  . Immunofixation electrophoresis  . Protein electrophoresis, serum  . Occult blood card to lab, stool  . Occult blood card to lab, stool  . Occult blood card to lab, stool  . C-reactive protein    MEDICATIONS PRESCRIBED THIS ENCOUNTER: Meds ordered this encounter  Medications  . DISCONTD: losartan (COZAAR) 100 MG tablet  . DISCONTD: azithromycin (ZITHROMAX) 250 MG tablet    All questions were answered. The patient knows to call the clinic with any problems, questions or concerns. We can certainly see the patient much sooner if necessary.  This note is electronically signed [email or web address] Cyril Mourning, MD  08/11/2016 12:16 PM

## 2016-08-11 NOTE — Addendum Note (Signed)
Addended by: Lianne Cure A on: 08/11/2016 09:37 AM   Modules accepted: Orders

## 2016-08-11 NOTE — Assessment & Plan Note (Addendum)
Normocytic, normochromic anemia in the setting of Stage III chronic renal disease secondary to HTN/DM- insulin dependent (well controlled).  Colonoscopy performed by Dr. Laural Golden in Jan 2017.  Labs today: CBC diff, CMET, LDH, ESR, CRP, anemia panel, haptoglobin, EPO level, retic count, SPEP+IFE, light chain assay, pathology smear review, and stool cards x 3.  I suspect anemia is secondary to chronic renal disease, but given his significant renal dysfunction, will screen for multiple myeloma with SPEP+IFE/light chain assay.  We recommend a referral to nephrology for management of renal dysfunction.  He is on the verge of transitioning to Stage IV renal dysfunction based upon available eGFRs provided by primary care provider.  He will return in 2-3 weeks for follow-up and review of information.  If answer is not found on peripheral work-up, patient is advised that bone marrow aspiration and biopsy may be necessary.

## 2016-08-12 LAB — IMMUNOFIXATION ELECTROPHORESIS
IGG (IMMUNOGLOBIN G), SERUM: 1272 mg/dL (ref 700–1600)
IGM, SERUM: 38 mg/dL (ref 15–143)
IgA: 194 mg/dL (ref 61–437)
TOTAL PROTEIN ELP: 7 g/dL (ref 6.0–8.5)

## 2016-08-12 LAB — PROTEIN ELECTROPHORESIS, SERUM
A/G RATIO SPE: 0.9 (ref 0.7–1.7)
Albumin ELP: 3.3 g/dL (ref 2.9–4.4)
Alpha-1-Globulin: 0.3 g/dL (ref 0.0–0.4)
Alpha-2-Globulin: 1.2 g/dL — ABNORMAL HIGH (ref 0.4–1.0)
BETA GLOBULIN: 1 g/dL (ref 0.7–1.3)
GAMMA GLOBULIN: 1.3 g/dL (ref 0.4–1.8)
Globulin, Total: 3.7 g/dL (ref 2.2–3.9)
Total Protein ELP: 7 g/dL (ref 6.0–8.5)

## 2016-08-12 LAB — PATHOLOGIST SMEAR REVIEW

## 2016-08-12 LAB — KAPPA/LAMBDA LIGHT CHAINS
KAPPA, LAMDA LIGHT CHAIN RATIO: 2.23 — AB (ref 0.26–1.65)
Kappa free light chain: 56.8 mg/L — ABNORMAL HIGH (ref 3.3–19.4)
Lambda free light chains: 25.5 mg/L (ref 5.7–26.3)

## 2016-08-12 LAB — IGG, IGA, IGM
IgA: 189 mg/dL (ref 61–437)
IgG (Immunoglobin G), Serum: 1283 mg/dL (ref 700–1600)
IgM, Serum: 37 mg/dL (ref 15–143)

## 2016-08-12 LAB — HAPTOGLOBIN: Haptoglobin: 306 mg/dL — ABNORMAL HIGH (ref 34–200)

## 2016-08-12 LAB — ERYTHROPOIETIN: ERYTHROPOIETIN: 15.5 m[IU]/mL (ref 2.6–18.5)

## 2016-08-17 ENCOUNTER — Encounter (HOSPITAL_COMMUNITY): Payer: Self-pay | Admitting: Oncology

## 2016-08-17 ENCOUNTER — Other Ambulatory Visit (HOSPITAL_COMMUNITY): Payer: Self-pay | Admitting: Oncology

## 2016-08-17 DIAGNOSIS — E538 Deficiency of other specified B group vitamins: Secondary | ICD-10-CM

## 2016-08-17 HISTORY — DX: Deficiency of other specified B group vitamins: E53.8

## 2016-08-18 ENCOUNTER — Other Ambulatory Visit (HOSPITAL_COMMUNITY): Payer: Self-pay | Admitting: Emergency Medicine

## 2016-08-18 DIAGNOSIS — D649 Anemia, unspecified: Secondary | ICD-10-CM | POA: Diagnosis not present

## 2016-08-18 LAB — OCCULT BLOOD X 1 CARD TO LAB, STOOL
Fecal Occult Bld: NEGATIVE
Fecal Occult Bld: NEGATIVE
Fecal Occult Bld: NEGATIVE

## 2016-08-20 ENCOUNTER — Encounter (HOSPITAL_COMMUNITY): Payer: PPO

## 2016-08-20 ENCOUNTER — Encounter (HOSPITAL_BASED_OUTPATIENT_CLINIC_OR_DEPARTMENT_OTHER): Payer: PPO

## 2016-08-20 ENCOUNTER — Encounter (HOSPITAL_COMMUNITY): Payer: Self-pay

## 2016-08-20 VITALS — BP 105/49 | HR 78 | Temp 98.0°F | Resp 20

## 2016-08-20 DIAGNOSIS — E538 Deficiency of other specified B group vitamins: Secondary | ICD-10-CM

## 2016-08-20 DIAGNOSIS — D649 Anemia, unspecified: Secondary | ICD-10-CM | POA: Diagnosis not present

## 2016-08-20 MED ORDER — CYANOCOBALAMIN 1000 MCG/ML IJ SOLN
1000.0000 ug | Freq: Once | INTRAMUSCULAR | Status: AC
  Administered 2016-08-20: 1000 ug via INTRAMUSCULAR
  Filled 2016-08-20: qty 1

## 2016-08-20 NOTE — Patient Instructions (Signed)
[facility] at [facility] Discharge Instructions  RECOMMENDATIONS MADE BY THE CONSULTANT AND ANY TEST RESULTS WILL BE SENT TO YOUR REFERRING PHYSICIAN.  B12 injection today. Return as scheduled for injections. Return as scheduled for lab work and office visit.  Thank you for choosing [facility] at [facility] to provide your oncology and hematology care.  To afford each patient quality time with our provider, please arrive at least 15 minutes before your scheduled appointment time.   Beginning January 23rd 2017 lab work for the Cancer Center will be done in the  Main lab at [place] on 1st floor. If you have a lab appointment with the Cancer Center please come in thru the  Main Entrance and check in at the main information desk  You need to re-schedule your appointment should you arrive 10 or more minutes late.  We strive to give you quality time with our providers, and arriving late affects you and other patients whose appointments are after yours.  Also, if you no show three or more times for appointments you may be dismissed from the clinic at the providers discretion.     Again, thank you for choosing [facility].  Our hope is that these requests will decrease the amount of time that you wait before being seen by our physicians.       _____________________________________________________________  Should you have questions after your visit to [facility], please contact our office at [phone] between the hours of 8:30 a.m. and 4:30 p.m.  Voicemails left after 4:30 p.m. will not be returned until the following business day.  For prescription refill requests, have your pharmacy contact our office.         Resources For Cancer Patients and their Caregivers ? American Cancer Society: Can assist with transportation, wigs, general needs, runs Look Good Feel Better.        1-888-227-6333 ? Cancer  Care: Provides financial assistance, online support groups, medication/co-pay assistance.  1-800-813-HOPE (4673) ? [facility] Assists [place] cancer patients and their families through emotional , educational and financial support.  336-427-4357 ? [place] DSS Where to apply for food stamps, Medicaid and utility assistance. 336-342-1394 ? [facility]: Transportation to medical appointments. 336-347-2287 ? Social Security Administration: May apply for disability if have a Stage IV cancer. 336-342-7796 1-800-772-1213 ? [place] Aging, Disability and Transit Services: Assists with nutrition, care and transit needs. 336-349-2343  Cancer Center Support Programs: @10RELATIVEDAYS@ > Cancer Support Group  2nd Tuesday of the month 1pm-2pm, Journey Room  > Creative Journey  3rd Tuesday of the month 1130am-1pm, Journey Room  > Look Good Feel Better  1st Wednesday of the month 10am-12 noon, Journey Room (Call American Cancer Society to register 1-800-395-5775)   

## 2016-08-20 NOTE — Progress Notes (Signed)
Stephen [name] [facility] presents today for injection per the provider's orders.  B12 administration without incident; see MAR for injection details.  Patient tolerated procedure well and without incident.  No questions or complaints noted at this time.

## 2016-08-21 LAB — HOMOCYSTEINE: HOMOCYSTEINE-NORM: 21.7 umol/L — AB (ref 0.0–15.0)

## 2016-08-21 LAB — INTRINSIC FACTOR ANTIBODIES: Intrinsic Factor: 1 AU/mL (ref 0.0–1.1)

## 2016-08-24 LAB — METHYLMALONIC ACID, SERUM: Methylmalonic Acid, Quantitative: 989 nmol/L — ABNORMAL HIGH (ref 0–378)

## 2016-08-24 LAB — ANTI-PARIETAL ANTIBODY: Parietal Cell Antibody-IgG: 18.7 Units (ref 0.0–20.0)

## 2016-08-28 ENCOUNTER — Encounter (HOSPITAL_COMMUNITY): Payer: PPO | Attending: Oncology | Admitting: Hematology & Oncology

## 2016-08-28 ENCOUNTER — Encounter (HOSPITAL_COMMUNITY): Payer: PPO

## 2016-08-28 ENCOUNTER — Encounter (HOSPITAL_BASED_OUTPATIENT_CLINIC_OR_DEPARTMENT_OTHER): Payer: PPO

## 2016-08-28 ENCOUNTER — Encounter (HOSPITAL_COMMUNITY): Payer: Self-pay | Admitting: Hematology & Oncology

## 2016-08-28 VITALS — BP 131/57 | HR 82 | Temp 97.6°F | Resp 18 | Wt 234.7 lb

## 2016-08-28 DIAGNOSIS — E611 Iron deficiency: Secondary | ICD-10-CM

## 2016-08-28 DIAGNOSIS — D631 Anemia in chronic kidney disease: Secondary | ICD-10-CM

## 2016-08-28 DIAGNOSIS — D649 Anemia, unspecified: Secondary | ICD-10-CM

## 2016-08-28 DIAGNOSIS — R700 Elevated erythrocyte sedimentation rate: Secondary | ICD-10-CM

## 2016-08-28 DIAGNOSIS — E538 Deficiency of other specified B group vitamins: Secondary | ICD-10-CM

## 2016-08-28 DIAGNOSIS — I4891 Unspecified atrial fibrillation: Secondary | ICD-10-CM

## 2016-08-28 DIAGNOSIS — N183 Chronic kidney disease, stage 3 unspecified: Secondary | ICD-10-CM

## 2016-08-28 DIAGNOSIS — I129 Hypertensive chronic kidney disease with stage 1 through stage 4 chronic kidney disease, or unspecified chronic kidney disease: Secondary | ICD-10-CM

## 2016-08-28 DIAGNOSIS — E1122 Type 2 diabetes mellitus with diabetic chronic kidney disease: Secondary | ICD-10-CM

## 2016-08-28 LAB — ABO/RH: ABO/RH(D): O POS

## 2016-08-28 LAB — PREPARE RBC (CROSSMATCH)

## 2016-08-28 MED ORDER — CYANOCOBALAMIN 1000 MCG/ML IJ SOLN
INTRAMUSCULAR | Status: AC
  Filled 2016-08-28: qty 1

## 2016-08-28 MED ORDER — CYANOCOBALAMIN 1000 MCG/ML IJ SOLN
1000.0000 ug | Freq: Once | INTRAMUSCULAR | Status: AC
  Administered 2016-08-28: 1000 ug via INTRAMUSCULAR

## 2016-08-28 NOTE — Progress Notes (Signed)
Gent Rack [facility] presents today for injection per the provider's orders.  B12 administration without incident; see MAR for injection details.  Patient tolerated procedure well and without incident.  No questions or complaints noted at this time.

## 2016-08-28 NOTE — Progress Notes (Signed)
PROGRESS NOTE  [name], MD [address] / [place] [address]   DIAGNOSIS: No matching staging information was found for the patient. Normocytic, normochromic anemia Anemia in stage 3 CKD B12 deficiency  CURRENT THERAPY: workup  INTERVAL HISTORY: Stephen [name] 75 y.o. male with a medical history significant for Stage III chronic renal disease, well controlled DM-insulin dependent, HTN, CAD, hyperlipidemia, carotid artery stenosis, CHF, PAD, and PVD, who is referred to the [facility] for normocytic, normochromic anemia.  Stephen [name] returns to the cancer center today unaccompanied. I have reviewed the labs from his initial visit with the patient.   He complains of SOB, weakness, and fatigue all of the time.   He denies and b symptoms, unintentional weight loss, chest pain, abdominal pain, hemoptysis, hematemesis, epistaxis, blood in stool, dark stools, hematuria. Denies any other complaints today.   Colonoscopy on 10/10/2015 with Dr. Laural Golden: Prep satisfactory. Normal mucosa of cecum, ascending colon, hepatic flexure, transverse colon, splenic flexure and descending colon. Few small diverticula at sigmoid colon. Normal rectal mucosa and anorectal junction.  MEDICAL HISTORY: Past Medical History:  Diagnosis Date  . Anemia 06/01/2012   H&H of 10/30 in 8/13 with normal MCV   . B12 deficiency 08/17/2016  . Carotid artery occlusion   . Celiac disease   . Cerebrovascular disease    H/o CVA In 2001 followed by a right carotid endarterectomy; left carotid endarterectomy in 2008  . Coronary atherosclerosis of native coronary artery    PTCA of LAD in 1994; total obstruction of the RCA treated medically; subsequent stress nuclear study with inferior ischemia  . DDD (degenerative disc disease), lumbar    Lumbar surgery in 1984  . Diabetes mellitus type II   . Essential hypertension, benign   . Hyperlipidemia   . Peripheral vascular disease ([place])   .  Stroke [facility]) 2001   Right brain    SURGICAL HISTORY: Past Surgical History:  Procedure Laterality Date  . APPENDECTOMY  1970  . CAROTID ENDARTERECTOMY  2008   Left   . CAROTID ENDARTERECTOMY  06/15/2000   Right  . COLONOSCOPY N/A 10/10/2015   Procedure: COLONOSCOPY;  Surgeon: Rogene Houston, MD;  Location: [facility];  Service: Endoscopy;  Laterality: N/A;  12:25 - moved to 11:15 - Ann to notify  . [place] SURGERY  2000, 2001   L5 HNP required 2 surgical procedures    SOCIAL HISTORY: Social History   Social History  . Marital status: Widowed    Spouse name: N/A  . Number of children: N/A  . Years of education: N/A   Occupational History  . Not on file.   Social History Main Topics  . Smoking status: Former Smoker    Packs/day: 3.00    Years: 10.00    Types: Cigarettes    Quit date: 09/21/1972  . Smokeless tobacco: Never Used  . Alcohol use No  . Drug use: No  . Sexual activity: Yes    Partners: Male    Birth control/ protection: Post-menopausal   Other Topics Concern  . Not on file   Social History Narrative  . No narrative on file    FAMILY HISTORY: Family History  Problem Relation Age of Onset  . Heart disease Mother     Before age 23  . Diabetes Mother   . Heart attack Mother   . Diabetes Sister   . Heart disease Brother   . Diabetes Brother   .  Diabetes Daughter   . Diabetes Son   . Hyperlipidemia Son     Review of Systems  Constitutional: Positive for malaise/fatigue.  HENT: Negative.   Eyes: Negative.   Respiratory: Positive for shortness of breath.   Cardiovascular: Negative.   Gastrointestinal: Negative.   Genitourinary: Negative.   Musculoskeletal: Negative.   Skin: Negative.   Neurological: Positive for weakness.  Endo/Heme/Allergies: Negative.   Psychiatric/Behavioral: Negative.   All other systems reviewed and are negative. 14 point review of systems was performed and is negative except as detailed under history of  present illness and above   PHYSICAL EXAMINATION  ECOG PERFORMANCE STATUS: 2 - Symptomatic, <50% confined to bed  Vitals:   08/28/16 0821  BP: (!) 131/57  Pulse: 82  Resp: 18  Temp: 97.6 F (36.4 C)     Physical Exam  Constitutional: He is oriented to person, place, and time and well-developed, well-nourished, and in no distress.  Pt was able to get on exam table without assistance.   HENT:  Head: Normocephalic and atraumatic.  Mouth/Throat: Oropharynx is clear and moist.  Eyes: Conjunctivae and EOM are normal. Pupils are equal, round, and reactive to light. No scleral icterus.  Neck: Normal range of motion. Neck supple.  Cardiovascular: Normal rate, regular rhythm and normal heart sounds.   Pulmonary/Chest: Effort normal and breath sounds normal.  Abdominal: Soft. Bowel sounds are normal. He exhibits no distension. There is no tenderness. There is no rebound and no guarding.  Musculoskeletal: Normal range of motion.  Lymphadenopathy:    He has no cervical adenopathy.  Neurological: He is alert and oriented to person, place, and time. Gait normal.  Skin: Skin is warm and dry.  Psychiatric: Affect and judgment normal.  Nursing note and vitals reviewed.   LABORATORY DATA:  CBC    Component Value Date/Time   WBC 6.6 08/11/2016 1238   RBC 2.94 (L) 08/11/2016 1238   RBC 2.94 (L) 08/11/2016 1238   HGB 8.6 (L) 08/11/2016 1238   HCT 27.8 (L) 08/11/2016 1238   PLT 167 08/11/2016 1238   MCV 94.6 08/11/2016 1238   MCH 29.3 08/11/2016 1238   MCHC 30.9 08/11/2016 1238   RDW 15.4 08/11/2016 1238   LYMPHSABS 2.0 08/11/2016 1238   MONOABS 0.6 08/11/2016 1238   EOSABS 0.1 08/11/2016 1238   BASOSABS 0.1 08/11/2016 1238    CMP     Component Value Date/Time   NA 138 08/11/2016 1238   K 4.9 08/11/2016 1238   CL 107 08/11/2016 1238   CO2 23 08/11/2016 1238   GLUCOSE 112 (H) 08/11/2016 1238   BUN 50 (H) 08/11/2016 1238   CREATININE 2.18 (H) 08/11/2016 1238   CALCIUM 8.9  08/11/2016 1238   PROT 7.5 08/11/2016 1238   ALBUMIN 3.6 08/11/2016 1238   AST 20 08/11/2016 1238   ALT 14 (L) 08/11/2016 1238   ALKPHOS 66 08/11/2016 1238   BILITOT 0.4 08/11/2016 1238   GFRNONAA 28 (L) 08/11/2016 1238   GFRAA 32 (L) 08/11/2016 1238   Results for SEDERICK, JACOBSEN (MRN [id number])   Ref. Range 08/11/2016 12:38 08/11/2016 12:38 08/11/2016 12:38 08/20/2016 13:30  Iron Latest Ref Range: 45 - 182 ug/dL 41 (L)     UIBC Latest Units: ug/dL 301     TIBC Latest Ref Range: 250 - 450 ug/dL 342     Saturation Ratios Latest Ref Range: 17.9 - 39.5 % 12 (L)     Ferritin Latest Ref Range: 24 - 336 ng/mL 134  Folate Latest Ref Range: >5.9 ng/mL 29.9     Intrinsic Factor Latest Ref Range: 0.0 - 1.1 AU/mL    1.0  CRP Latest Ref Range: <1.0 mg/dL 1.1 (H)     Methylmalonic Acid, Quantitative Latest Ref Range: 0 - 378 nmol/L    989 (H)  Vitamin B12 Latest Ref Range: 180 - 914 pg/mL 176 (L)     Homocysteine Latest Ref Range: 0.0 - 15.0 umol/L    21.7 (H)  Total Protein ELP Latest Ref Range: 6.0 - 8.5 g/dL  7.0 7.0   Albumin ELP Latest Ref Range: 2.9 - 4.4 g/dL  3.3    Globulin, Total Latest Ref Range: 2.2 - 3.9 g/dL  3.7    A/G Ratio Latest Ref Range: 0.7 - 1.7   0.9    Alpha-1-Globulin Latest Ref Range: 0.0 - 0.4 g/dL  0.3    Alpha-2-Globulin Latest Ref Range: 0.4 - 1.0 g/dL  1.2 (H)    Beta Globulin Latest Ref Range: 0.7 - 1.3 g/dL  1.0    Gamma Globulin Latest Ref Range: 0.4 - 1.8 g/dL  1.3    M-SPIKE, % Latest Ref Range: Not Observed g/dL  Not Observed    SPE Interp. Unknown  Comment    Comment Unknown  Comment    IgG (Immunoglobin G), Serum Latest Ref Range: 700 - 1,600 mg/dL 1,283  1,272   IgA Latest Ref Range: 61 - 437 mg/dL 189  194   IgM, Serum Latest Ref Range: 15 - 143 mg/dL 37  38   Kappa free light chain Latest Ref Range: 3.3 - 19.4 mg/L 56.8 (H)     Lamda free light chains Latest Ref Range: 5.7 - 26.3 mg/L 25.5     Kappa, lamda light chain ratio Latest Ref Range: 0.26  - 1.65  2.23 (H)        PENDING LABS:   RADIOGRAPHIC STUDIES:  Study Result   CLINICAL DATA:  Patient with fluid retention for 2 months.  EXAM: CHEST  2 VIEW  COMPARISON:  Chest radiograph 09/18/2013  FINDINGS: Stable enlarged cardiac and mediastinal contours with tortuosity the thoracic aorta. No consolidative pulmonary opacities. Pulmonary vascular redistribution. No pleural effusion or pneumothorax. Mid thoracic spine degenerative changes.  IMPRESSION: No acute cardiopulmonary process. Cardiomegaly. Pulmonary vascular redistribution.   Electronically Signed   By: Lovey Newcomer M.D.   On: 07/09/2015 16:33      ASSESSMENT and THERAPY PLAN:  Anemia, multifactorial CKD, Stage III Elevated ESR B12 deficiency Iron deficiency  Normocytic, normochromic anemia in the setting of Stage III chronic renal disease secondary to HTN/DM- insulin dependent (well controlled).  Colonoscopy performed by Dr. Laural Golden in Jan 2017.  Recent right PCA acute infarction requiring tPA on 09/04/2016 at 1831 hours requiring transfer to [facility] hospital and hospitalization from 09/04/2016- 09/08/2016.  Cause of embolic stroke from newly diagnosed A-fib.  Now anticoagulated with Eliquis and ASA for stroke and cardiac prevention.  I will give him a dose of IV iron next week since he can not tolerate oral iron.  He should be getting B12 injections once a week. He has only had B12 on 08/20/2016, so he will receive an injection today. He will then get an injection once a week for 4 weeks, then he will get one injection a month.   No evidence of pernicious anemia based on antibody testing.  We discussed the potential contribution of his kidney disease to his anemia and that in the future we may need to  consider ESA therapy.  Will arrange for transfusion of 1 U PRBC early next week due to his weakness, fatigue, and SOB.   He will return for a follow up in a week after he has received IV iron and  ongoing B12 replacement. Counts will be followed and if no significant improvement will discuss ESA replacement.  Orders Placed This Encounter  Procedures  . CBC with Differential    Standing Status:   Future    Number of Occurrences:   1    Standing Expiration Date:   09/25/17  . Type and screen    Standing Status:   Future    Number of Occurrences:   1    Standing Expiration Date:   2017/09/25  . Prepare RBC    Standing Status:   Standing    Number of Occurrences:   1    Order Specific Question:   # of Units    Answer:   2 units    Order Specific Question:   Transfusion Indications    Answer:   Symptomatic Anemia    Order Specific Question:   If emergent release call blood bank    Answer:   Not emergent release  . BLOOD TRANSFUSION REPORT - SCANNED  . BLOOD TRANSFUSION REPORT - SCANNED   All questions were answered. The patient knows to call the clinic with any problems, questions or concerns. We can certainly see the patient much sooner if necessary.  This document serves as a record of services personally performed by Ancil Linsey, MD. It was created on her behalf by Martinique Casey, a trained medical scribe. The creation of this record is based on the scribe's personal observations and the provider's statements to them. This document has been checked and approved by the attending provider.  This note was electronically signed.  [name], MD 08/28/2016

## 2016-08-28 NOTE — Patient Instructions (Addendum)
[place] at [facility] Discharge Instructions  RECOMMENDATIONS MADE BY THE CONSULTANT AND ANY TEST RESULTS WILL BE SENT TO YOUR REFERRING PHYSICIAN.  You saw Dr.Penland today. B12 injection weekly x 4 then monthly. Blood transfusion next week. Iv Iron will be scheduled. Follow up in 2 weeks with labs. See Amy at checkout for appointments.  Thank you for choosing [place] at [facility] to provide your oncology and hematology care.  To afford each patient quality time with our provider, please arrive at least 15 minutes before your scheduled appointment time.   Beginning January 23rd 2017 lab work for the [facility] will be done in the  Main lab at [facility] on 1st floor. If you have a lab appointment with the [facility] please come in thru the  Main Entrance and check in at the main information desk  You need to re-schedule your appointment should you arrive 10 or more minutes late.  We strive to give you quality time with our providers, and arriving late affects you and other patients whose appointments are after yours.  Also, if you no show three or more times for appointments you may be dismissed from the clinic at the providers discretion.     Again, thank you for choosing [facility].  Our hope is that these requests will decrease the amount of time that you wait before being seen by our physicians.       _____________________________________________________________  Should you have questions after your visit to [facility], please contact our office at [phone] between the hours of 8:30 a.m. and 4:30 p.m.  Voicemails left after 4:30 p.m. will not be returned until the following business day.  For prescription refill requests, have your pharmacy contact our office.         Resources For Cancer Patients and their Caregivers ? American Cancer Society: Can assist with transportation, wigs,  general needs, runs Look Good Feel Better.        816-199-4436 ? Cancer Care: Provides financial assistance, online support groups, medication/co-pay assistance.  1-800-813-HOPE 719-029-2996) ? [facility] Assists [place] cancer patients and their families through emotional , educational and financial support.  (484)275-1273 ? [place] DSS Where to apply for food stamps, Medicaid and utility assistance. 636-465-0634 ? RCATS: Transportation to medical appointments. 856-054-5624 ? Social Security Administration: May apply for disability if have a Stage IV cancer. (413)854-0500 (904)456-0846 ? [employer], Disability and Transit Services: Assists with nutrition, care and transit needs. [place] Support Programs: @10RELATIVEDAYS @ > Cancer Support Group  2nd Tuesday of the month 1pm-2pm, Journey Room  > Creative Journey  3rd Tuesday of the month 1130am-1pm, Journey Room  > Look Good Feel Better  1st Wednesday of the month 10am-12 noon, Journey Room (Call [place] to register [phone])

## 2016-08-31 ENCOUNTER — Encounter (HOSPITAL_BASED_OUTPATIENT_CLINIC_OR_DEPARTMENT_OTHER): Payer: PPO

## 2016-08-31 DIAGNOSIS — D649 Anemia, unspecified: Secondary | ICD-10-CM

## 2016-08-31 MED ORDER — ACETAMINOPHEN 325 MG PO TABS
650.0000 mg | ORAL_TABLET | Freq: Once | ORAL | Status: AC
  Filled 2016-08-31: qty 2

## 2016-08-31 MED ORDER — DIPHENHYDRAMINE HCL 25 MG PO CAPS
25.0000 mg | ORAL_CAPSULE | Freq: Once | ORAL | Status: AC
  Administered 2016-08-31: 25 mg via ORAL
  Filled 2016-08-31: qty 1

## 2016-08-31 MED ORDER — SODIUM CHLORIDE 0.9 % IV SOLN
INTRAVENOUS | Status: AC
  Administered 2016-08-31 – 2017-08-18 (×4): via INTRAVENOUS

## 2016-08-31 NOTE — Patient Instructions (Signed)
[place] at [facility] Discharge Instructions  RECOMMENDATIONS MADE BY THE CONSULTANT AND ANY TEST RESULTS WILL BE SENT TO YOUR REFERRING PHYSICIAN.  2 units of blood given today Follow up as scheduled.  Thank you for choosing [place] at [facility] to provide your oncology and hematology care.  To afford each patient quality time with our provider, please arrive at least 15 minutes before your scheduled appointment time.   Beginning January 23rd 2017 lab work for the [facility] will be done in the  Main lab at [facility] on 1st floor. If you have a lab appointment with the [facility] please come in thru the  Main Entrance and check in at the main information desk  You need to re-schedule your appointment should you arrive 10 or more minutes late.  We strive to give you quality time with our providers, and arriving late affects you and other patients whose appointments are after yours.  Also, if you no show three or more times for appointments you may be dismissed from the clinic at the providers discretion.     Again, thank you for choosing [facility].  Our hope is that these requests will decrease the amount of time that you wait before being seen by our physicians.       _____________________________________________________________  Should you have questions after your visit to [facility], please contact our office at [phone] between the hours of 8:30 a.m. and 4:30 p.m.  Voicemails left after 4:30 p.m. will not be returned until the following business day.  For prescription refill requests, have your pharmacy contact our office.         Resources For Cancer Patients and their Caregivers ? American Cancer Society: Can assist with transportation, wigs, general needs, runs Look Good Feel Better.        410-329-2471 ? Cancer Care: Provides financial assistance, online support groups,  medication/co-pay assistance.  1-800-813-HOPE 3076860828) ? [facility] Assists [place] cancer patients and their families through emotional , educational and financial support.  939-182-2826 ? [place] DSS Where to apply for food stamps, Medicaid and utility assistance. (409) 494-4165 ? RCATS: Transportation to medical appointments. 580-085-9857 ? Social Security Administration: May apply for disability if have a Stage IV cancer. 9256743599 [phone] ? [employer], Disability and Transit Services: Assists with nutrition, care and transit needs. [place] Support Programs: @10RELATIVEDAYS @ > Cancer Support Group  2nd Tuesday of the month 1pm-2pm, Journey Room  > Creative Journey  3rd Tuesday of the month 1130am-1pm, Journey Room  > Look Good Feel Better  1st Wednesday of the month 10am-12 noon, Journey Room (Call [place] to register [phone])

## 2016-08-31 NOTE — Progress Notes (Unsigned)
2 units of blood given today as ordered. Patient tolerated it well , no problems. Vitals stable and discharged in stable condition ambulatory from clinic.

## 2016-09-01 LAB — TYPE AND SCREEN
ABO/RH(D): O POS
ANTIBODY SCREEN: NEGATIVE
UNIT DIVISION: 0
UNIT DIVISION: 0

## 2016-09-02 ENCOUNTER — Encounter (HOSPITAL_BASED_OUTPATIENT_CLINIC_OR_DEPARTMENT_OTHER): Payer: PPO

## 2016-09-02 ENCOUNTER — Ambulatory Visit (HOSPITAL_COMMUNITY): Payer: PPO

## 2016-09-02 VITALS — BP 127/37 | HR 66 | Temp 97.9°F | Resp 16

## 2016-09-02 DIAGNOSIS — D649 Anemia, unspecified: Secondary | ICD-10-CM

## 2016-09-02 DIAGNOSIS — E538 Deficiency of other specified B group vitamins: Secondary | ICD-10-CM | POA: Diagnosis not present

## 2016-09-02 MED ORDER — CYANOCOBALAMIN 1000 MCG/ML IJ SOLN
1000.0000 ug | Freq: Once | INTRAMUSCULAR | Status: AC
  Administered 2016-09-02: 1000 ug via INTRAMUSCULAR

## 2016-09-02 MED ORDER — CYANOCOBALAMIN 1000 MCG/ML IJ SOLN
INTRAMUSCULAR | Status: AC
  Filled 2016-09-02: qty 1

## 2016-09-02 MED ORDER — SODIUM CHLORIDE 0.9 % IV SOLN
INTRAVENOUS | Status: DC
  Administered 2016-09-02: 14:00:00 via INTRAVENOUS

## 2016-09-02 MED ORDER — SODIUM CHLORIDE 0.9 % IV SOLN
125.0000 mg | Freq: Once | INTRAVENOUS | Status: AC
  Administered 2016-09-02: 125 mg via INTRAVENOUS
  Filled 2016-09-02: qty 10

## 2016-09-02 NOTE — Progress Notes (Signed)
Patient tolerated infusion well.  VSS.  Patient ambulatory and stable upon discharge from clinic.   

## 2016-09-02 NOTE — Patient Instructions (Signed)
[facility] at [facility] Discharge Instructions  RECOMMENDATIONS MADE BY THE CONSULTANT AND ANY TEST RESULTS WILL BE SENT TO YOUR REFERRING PHYSICIAN.  IV iron today.    Thank you for choosing [facility] at [facility] to provide your oncology and hematology care.  To afford each patient quality time with our provider, please arrive at least 15 minutes before your scheduled appointment time.   Beginning January 23rd 2017 lab work for the Cancer Center will be done in the  Main lab at [facility] on 1st floor. If you have a lab appointment with the Cancer Center please come in thru the  Main Entrance and check in at the main information desk  You need to re-schedule your appointment should you arrive 10 or more minutes late.  We strive to give you quality time with our providers, and arriving late affects you and other patients whose appointments are after yours.  Also, if you no show three or more times for appointments you may be dismissed from the clinic at the providers discretion.     Again, thank you for choosing [facility].  Our hope is that these requests will decrease the amount of time that you wait before being seen by our physicians.       _____________________________________________________________  Should you have questions after your visit to [facility], please contact our office at [phone] between the hours of 8:30 a.m. and 4:30 p.m.  Voicemails left after 4:30 p.m. will not be returned until the following business day.  For prescription refill requests, have your pharmacy contact our office.         Resources For Cancer Patients and their Caregivers ? American Cancer Society: Can assist with transportation, wigs, general needs, runs Look Good Feel Better.        1-888-227-6333 ? Cancer Care: Provides financial assistance, online support groups, medication/co-pay assistance.  1-800-813-HOPE  (4673) ? [facility] Assists [place] cancer patients and their families through emotional , educational and financial support.  336-427-4357 ? [place] DSS Where to apply for food stamps, Medicaid and utility assistance. 336-342-1394 ? RCATS: Transportation to medical appointments. 336-347-2287 ? Social Security Administration: May apply for disability if have a Stage IV cancer. 336-342-7796 1-800-772-1213 ? [place] A[employer], Disability and Transit Services: Assists with nutrition, care and transit needs. 336-349-2343  Cancer Center Support Programs: @10RELATIVEDAYS@ > Cancer Support Group  2nd Tuesday of the month 1pm-2pm, Journey Room  > Creative Journey  3rd Tuesday of the month 1130am-1pm, Journey Room  > Look Good Feel Better  1st Wednesday of the month 10am-12 noon, Journey Room (Call [employer] to register 1-800-395-5775)    

## 2016-09-04 ENCOUNTER — Emergency Department (HOSPITAL_COMMUNITY): Payer: PPO

## 2016-09-04 ENCOUNTER — Encounter (HOSPITAL_COMMUNITY): Payer: Self-pay | Admitting: *Deleted

## 2016-09-04 ENCOUNTER — Inpatient Hospital Stay (HOSPITAL_COMMUNITY)
Admission: EM | Admit: 2016-09-04 | Discharge: 2016-09-08 | DRG: 062 | Disposition: A | Discharge status: Home / Self Care | Payer: PPO | Attending: Neurology | Admitting: Neurology

## 2016-09-04 ENCOUNTER — Other Ambulatory Visit: Payer: Self-pay

## 2016-09-04 DIAGNOSIS — I6789 Other cerebrovascular disease: Secondary | ICD-10-CM | POA: Diagnosis not present

## 2016-09-04 DIAGNOSIS — E1122 Type 2 diabetes mellitus with diabetic chronic kidney disease: Secondary | ICD-10-CM | POA: Diagnosis not present

## 2016-09-04 DIAGNOSIS — Z6832 Body mass index (BMI) 32.0-32.9, adult: Secondary | ICD-10-CM | POA: Diagnosis not present

## 2016-09-04 DIAGNOSIS — I63411 Cerebral infarction due to embolism of right middle cerebral artery: Secondary | ICD-10-CM | POA: Diagnosis not present

## 2016-09-04 DIAGNOSIS — Z833 Family history of diabetes mellitus: Secondary | ICD-10-CM

## 2016-09-04 DIAGNOSIS — E785 Hyperlipidemia, unspecified: Secondary | ICD-10-CM | POA: Diagnosis not present

## 2016-09-04 DIAGNOSIS — Z87891 Personal history of nicotine dependence: Secondary | ICD-10-CM

## 2016-09-04 DIAGNOSIS — D649 Anemia, unspecified: Secondary | ICD-10-CM | POA: Diagnosis not present

## 2016-09-04 DIAGNOSIS — R531 Weakness: Secondary | ICD-10-CM | POA: Diagnosis not present

## 2016-09-04 DIAGNOSIS — N183 Chronic kidney disease, stage 3 (moderate): Secondary | ICD-10-CM | POA: Diagnosis present

## 2016-09-04 DIAGNOSIS — G8194 Hemiplegia, unspecified affecting left nondominant side: Secondary | ICD-10-CM | POA: Diagnosis not present

## 2016-09-04 DIAGNOSIS — Z794 Long term (current) use of insulin: Secondary | ICD-10-CM

## 2016-09-04 DIAGNOSIS — I63331 Cerebral infarction due to thrombosis of right posterior cerebral artery: Secondary | ICD-10-CM | POA: Diagnosis not present

## 2016-09-04 DIAGNOSIS — I639 Cerebral infarction, unspecified: Secondary | ICD-10-CM | POA: Diagnosis not present

## 2016-09-04 DIAGNOSIS — I6523 Occlusion and stenosis of bilateral carotid arteries: Secondary | ICD-10-CM | POA: Diagnosis present

## 2016-09-04 DIAGNOSIS — I6300 Cerebral infarction due to thrombosis of unspecified precerebral artery: Secondary | ICD-10-CM | POA: Diagnosis not present

## 2016-09-04 DIAGNOSIS — R29810 Facial weakness: Secondary | ICD-10-CM | POA: Diagnosis not present

## 2016-09-04 DIAGNOSIS — Z7982 Long term (current) use of aspirin: Secondary | ICD-10-CM

## 2016-09-04 DIAGNOSIS — E669 Obesity, unspecified: Secondary | ICD-10-CM | POA: Diagnosis not present

## 2016-09-04 DIAGNOSIS — I6340 Cerebral infarction due to embolism of unspecified cerebral artery: Secondary | ICD-10-CM | POA: Diagnosis not present

## 2016-09-04 DIAGNOSIS — Z7901 Long term (current) use of anticoagulants: Secondary | ICD-10-CM | POA: Diagnosis not present

## 2016-09-04 DIAGNOSIS — I129 Hypertensive chronic kidney disease with stage 1 through stage 4 chronic kidney disease, or unspecified chronic kidney disease: Secondary | ICD-10-CM | POA: Diagnosis present

## 2016-09-04 DIAGNOSIS — Z8249 Family history of ischemic heart disease and other diseases of the circulatory system: Secondary | ICD-10-CM

## 2016-09-04 DIAGNOSIS — E876 Hypokalemia: Secondary | ICD-10-CM | POA: Diagnosis present

## 2016-09-04 DIAGNOSIS — Z9049 Acquired absence of other specified parts of digestive tract: Secondary | ICD-10-CM

## 2016-09-04 DIAGNOSIS — G459 Transient cerebral ischemic attack, unspecified: Secondary | ICD-10-CM | POA: Diagnosis not present

## 2016-09-04 DIAGNOSIS — I480 Paroxysmal atrial fibrillation: Secondary | ICD-10-CM | POA: Diagnosis not present

## 2016-09-04 DIAGNOSIS — I63431 Cerebral infarction due to embolism of right posterior cerebral artery: Secondary | ICD-10-CM | POA: Diagnosis not present

## 2016-09-04 DIAGNOSIS — I2510 Atherosclerotic heart disease of native coronary artery without angina pectoris: Secondary | ICD-10-CM | POA: Diagnosis present

## 2016-09-04 DIAGNOSIS — R29818 Other symptoms and signs involving the nervous system: Secondary | ICD-10-CM | POA: Diagnosis not present

## 2016-09-04 DIAGNOSIS — Z79899 Other long term (current) drug therapy: Secondary | ICD-10-CM | POA: Diagnosis not present

## 2016-09-04 DIAGNOSIS — I10 Essential (primary) hypertension: Secondary | ICD-10-CM | POA: Diagnosis not present

## 2016-09-04 DIAGNOSIS — I4891 Unspecified atrial fibrillation: Secondary | ICD-10-CM | POA: Diagnosis not present

## 2016-09-04 DIAGNOSIS — R4781 Slurred speech: Secondary | ICD-10-CM | POA: Diagnosis not present

## 2016-09-04 DIAGNOSIS — I5031 Acute diastolic (congestive) heart failure: Secondary | ICD-10-CM

## 2016-09-04 LAB — I-STAT CHEM 8, ED
BUN: 57 mg/dL — ABNORMAL HIGH (ref 6–20)
CHLORIDE: 107 mmol/L (ref 101–111)
Calcium, Ion: 1.2 mmol/L (ref 1.15–1.40)
Creatinine, Ser: 2.3 mg/dL — ABNORMAL HIGH (ref 0.61–1.24)
Glucose, Bld: 179 mg/dL — ABNORMAL HIGH (ref 65–99)
HCT: 31 % — ABNORMAL LOW (ref 39.0–52.0)
Hemoglobin: 10.5 g/dL — ABNORMAL LOW (ref 13.0–17.0)
POTASSIUM: 5.3 mmol/L — AB (ref 3.5–5.1)
SODIUM: 138 mmol/L (ref 135–145)
TCO2: 23 mmol/L (ref 0–100)

## 2016-09-04 LAB — DIFFERENTIAL
BASOS ABS: 0 10*3/uL (ref 0.0–0.1)
BASOS PCT: 1 %
Eosinophils Absolute: 0.2 10*3/uL (ref 0.0–0.7)
Eosinophils Relative: 2 %
Lymphocytes Relative: 22 %
Lymphs Abs: 1.6 10*3/uL (ref 0.7–4.0)
Monocytes Absolute: 0.7 10*3/uL (ref 0.1–1.0)
Monocytes Relative: 10 %
NEUTROS ABS: 4.7 10*3/uL (ref 1.7–7.7)
Neutrophils Relative %: 65 %

## 2016-09-04 LAB — I-STAT TROPONIN, ED: Troponin i, poc: 0 ng/mL (ref 0.00–0.08)

## 2016-09-04 LAB — COMPREHENSIVE METABOLIC PANEL
ALT: 16 U/L — AB (ref 17–63)
AST: 17 U/L (ref 15–41)
Albumin: 3.5 g/dL (ref 3.5–5.0)
Alkaline Phosphatase: 61 U/L (ref 38–126)
Anion gap: 8 (ref 5–15)
BUN: 52 mg/dL — ABNORMAL HIGH (ref 6–20)
CHLORIDE: 105 mmol/L (ref 101–111)
CO2: 21 mmol/L — AB (ref 22–32)
CREATININE: 2.29 mg/dL — AB (ref 0.61–1.24)
Calcium: 8.5 mg/dL — ABNORMAL LOW (ref 8.9–10.3)
GFR calc non Af Amer: 26 mL/min — ABNORMAL LOW (ref >60)
GFR, EST AFRICAN AMERICAN: 30 mL/min — AB (ref >60)
Glucose, Bld: 188 mg/dL — ABNORMAL HIGH (ref 65–99)
Potassium: 5 mmol/L (ref 3.5–5.1)
SODIUM: 134 mmol/L — AB (ref 135–145)
Total Bilirubin: 0.3 mg/dL (ref 0.3–1.2)
Total Protein: 7.7 g/dL (ref 6.5–8.1)

## 2016-09-04 LAB — CBC
HCT: 33.1 % — ABNORMAL LOW (ref 39.0–52.0)
Hemoglobin: 10.6 g/dL — ABNORMAL LOW (ref 13.0–17.0)
MCH: 29.9 pg (ref 26.0–34.0)
MCHC: 32 g/dL (ref 30.0–36.0)
MCV: 93.5 fL (ref 78.0–100.0)
PLATELETS: 167 10*3/uL (ref 150–400)
RBC: 3.54 MIL/uL — ABNORMAL LOW (ref 4.22–5.81)
RDW: 15.6 % — ABNORMAL HIGH (ref 11.5–15.5)
WBC: 7.1 10*3/uL (ref 4.0–10.5)

## 2016-09-04 LAB — APTT: APTT: 36 s (ref 24–36)

## 2016-09-04 LAB — PROTIME-INR
INR: 1.14
PROTHROMBIN TIME: 14.7 s (ref 11.4–15.2)

## 2016-09-04 LAB — ETHANOL

## 2016-09-04 LAB — GLUCOSE, CAPILLARY: Glucose-Capillary: 131 mg/dL — ABNORMAL HIGH (ref 65–99)

## 2016-09-04 LAB — CBG MONITORING, ED: Glucose-Capillary: 160 mg/dL — ABNORMAL HIGH (ref 65–99)

## 2016-09-04 MED ORDER — ALTEPLASE (STROKE) FULL DOSE INFUSION
90.0000 mg | Freq: Once | INTRAVENOUS | Status: AC
  Administered 2016-09-04: 90 mg via INTRAVENOUS
  Filled 2016-09-04: qty 100

## 2016-09-04 MED ORDER — SODIUM CHLORIDE 0.9 % IV SOLN
INTRAVENOUS | Status: DC
  Administered 2016-09-04: 23:00:00 via INTRAVENOUS

## 2016-09-04 MED ORDER — SODIUM CHLORIDE 0.9 % IV SOLN: 50.0000 mL | Freq: Once | INTRAVENOUS | Status: AC

## 2016-09-04 MED ORDER — ALTEPLASE 100 MG IV SOLR
INTRAVENOUS | Status: AC
  Filled 2016-09-04: qty 100

## 2016-09-04 MED ORDER — NICARDIPINE HCL IN NACL 20-0.86 MG/200ML-% IV SOLN: 0.0000 mg/h | INTRAVENOUS | Status: DC

## 2016-09-04 MED ORDER — LABETALOL HCL 5 MG/ML IV SOLN: 20.0000 mg | Freq: Once | INTRAVENOUS | Status: DC

## 2016-09-04 MED ORDER — SENNOSIDES-DOCUSATE SODIUM 8.6-50 MG PO TABS: 1.0000 | ORAL_TABLET | Freq: Every evening | ORAL | Status: DC | PRN

## 2016-09-04 MED ORDER — STROKE: EARLY STAGES OF RECOVERY BOOK
Freq: Once | Status: AC
  Administered 2016-09-04: 1
  Filled 2016-09-04: qty 1

## 2016-09-04 MED ORDER — ACETAMINOPHEN 650 MG RE SUPP: 650.0000 mg | RECTAL | Status: DC | PRN

## 2016-09-04 MED ORDER — PANTOPRAZOLE SODIUM 40 MG IV SOLR
40.0000 mg | Freq: Every day | INTRAVENOUS | Status: DC
  Administered 2016-09-05 – 2016-09-06 (×3): 40 mg via INTRAVENOUS
  Filled 2016-09-04 (×3): qty 40

## 2016-09-04 MED ORDER — IOPAMIDOL (ISOVUE-370) INJECTION 76%
INTRAVENOUS | Status: AC
  Administered 2016-09-04: 50 mL
  Filled 2016-09-04: qty 50

## 2016-09-04 MED ORDER — ACETAMINOPHEN 160 MG/5ML PO SOLN: 650.0000 mg | ORAL | Status: DC | PRN

## 2016-09-04 MED ORDER — ACETAMINOPHEN 325 MG PO TABS: 650.0000 mg | ORAL_TABLET | ORAL | Status: DC | PRN

## 2016-09-04 NOTE — ED Triage Notes (Signed)
Right leg weakness

## 2016-09-04 NOTE — ED Notes (Signed)
Pt in CT 2 for angio Head; delay d/t registration issue and inability to place orders on patient

## 2016-09-04 NOTE — ED Notes (Signed)
Called SOC. 

## 2016-09-04 NOTE — ED Notes (Signed)
Beeped Code Stroke out called CT

## 2016-09-04 NOTE — ED Notes (Signed)
EMS called Code Stroke.

## 2016-09-04 NOTE — ED Notes (Signed)
carelink here at [facility]

## 2016-09-04 NOTE — ED Notes (Addendum)
Pt back to TRA A from CT; RN called lobby to send pts family back

## 2016-09-04 NOTE — ED Provider Notes (Signed)
[facility] DEPT Provider Note   CSN: [id number] Arrival date & time: 09/04/16  1751     History   Chief Complaint Chief Complaint  [name] presents with  . Weakness    Stephen [name] [name] is a 75 y.o. male.  Stephen [name]  Stephen [name] [name] is a 75 year old male, history of anemia on iron and B12 infusions, history of a carotid artery occlusion as well as a history of a stroke in 2001 which was followed by an endarterectomy on Stephen [name] right as well as a left endarterectomy in 2008 on Stephen [name] left. He has a history of cardiac disease as well as a history of peripheral vascular disease and presents after being seen last normal according to Stephen [name] paramedics at 5:00 PM after which time he tried to get up and walk but was having some left-sided difficulties with weakness in his arm and his leg. It was reported that his speech was slurred, when Stephen [name] paramedics got there he was looking better and sitting at Stephen [name] table but in front of them they watched him continue to worsen and have a dense left arm and leg hemiparesis as well as left facial droop and slurred speech. Stephen [name] [name] is unable to give much information other than stating that he does take aspirin which is difficult to understand when he talks.  Past Medical History:  Diagnosis Date  . Anemia 06/01/2012   H&H of 10/30 in 8/13 with normal MCV   . B12 deficiency 08/17/2016  . Carotid artery occlusion   . Celiac disease   . Cerebrovascular disease    H/o CVA In 2001 followed by a right carotid endarterectomy; left carotid endarterectomy in 2008  . Coronary atherosclerosis of native coronary artery    PTCA of LAD in 1994; total obstruction of Stephen [name] RCA treated medically; subsequent stress nuclear study with inferior ischemia  . DDD (degenerative disc disease), lumbar    Lumbar surgery in 1984  . Diabetes mellitus type II   . Essential hypertension, benign   . Hyperlipidemia   . Peripheral vascular disease ([place])   . Stroke [facility]) 2001   Right brain     [name] Active Problem List   Diagnosis Date Noted  . B12 deficiency 08/17/2016  . PAD (peripheral artery disease) ([place]) 01/21/2016  . Irregular heartbeat 07/19/2014  . Atherosclerotic PVD with intermittent claudication ([place]) 07/17/2014  . Carotid stenosis 07/12/2014  . Aftercare following surgery of Stephen [name] circulatory system 07/12/2014  . Chronic diastolic heart failure ([place]) 09/27/2013  . Occlusion and stenosis of carotid artery without mention of cerebral infarction 06/28/2012  . Anemia 06/01/2012  . Chronic renal disease, stage 3, moderately decreased glomerular filtration rate between 30-59 mL/min/1.73 square meter 06/01/2012  . Diabetes mellitus type II   . Essential hypertension, benign   . Hyperlipidemia   . Cerebrovascular disease   . Coronary atherosclerosis of native coronary artery   . Peripheral vascular disease [facility])     Past Surgical History:  Procedure Laterality Date  . APPENDECTOMY  1970  . CAROTID ENDARTERECTOMY  2008   Left   . CAROTID ENDARTERECTOMY  06/15/2000   Right  . COLONOSCOPY N/A 10/10/2015   Procedure: COLONOSCOPY;  Surgeon: Rogene Houston, MD;  Location: [facility]O [facility];  Service: Endoscopy;  Laterality: N/A;  12:25 - moved to 11:15 - Ann to notify  . [place] SURGERY  2000, 2001   L5 HNP required 2 surgical procedures       Home Medications    Prior to  Admission medications   Medication Sig Start Date End Date Taking? Authorizing Provider  acetaminophen (TYLENOL) 500 MG tablet Take 500 mg by mouth every 6 (six) hours as needed.    Historical Provider, MD  amLODipine (NORVASC) 5 MG tablet Take 5 mg by mouth daily.    Historical Provider, MD  aspirin 81 MG tablet Take 81 mg by mouth daily.    Historical Provider, MD  atenolol (TENORMIN) 50 MG tablet Take 50 mg by mouth daily.      Historical Provider, MD  doxazosin (CARDURA) 2 MG tablet Take 2 mg by mouth daily.    Historical Provider, MD  furosemide (LASIX) 40 MG tablet TAKE 1 AND 1/2  TABLET (60 MG) IN Stephen [name] AM AND TAKE 1 TABLET (40 MG) IN Stephen [name] EVENING 08/03/16   Lendon Colonel, NP  insulin aspart (NOVOLOG) 100 UNIT/ML injection Inject 25 Units into Stephen [name] skin 2 (two) times daily.     Historical Provider, MD  insulin detemir (LEVEMIR) 100 UNIT/ML injection Inject 65 Units into Stephen [name] skin at bedtime.    Historical Provider, MD  Lansoprazole (PREVACID PO) Take by mouth as needed.    Historical Provider, MD  losartan (COZAAR) 25 MG tablet Take 25 mg by mouth daily.    Historical Provider, MD  pravastatin (PRAVACHOL) 40 MG tablet Take 40 mg by mouth daily.    Historical Provider, MD    Family History Family History  Problem Relation Age of Onset  . Heart disease Mother     Before age 11  . Diabetes Mother   . Heart attack Mother   . Diabetes Sister   . Heart disease Brother   . Diabetes Brother   . Diabetes Daughter   . Diabetes Son   . Hyperlipidemia Son     Social History Social History  Substance Use Topics  . Smoking status: Former Smoker    Packs/day: 3.00    Years: 10.00    Types: Cigarettes    Quit date: 09/21/1972  . Smokeless tobacco: Never Used  . Alcohol use No     Allergies   [name] has no known allergies.   Review of Systems Review of Systems  Unable to perform ROS: Acuity of condition     Physical Exam Updated Vital Signs BP (!) 131/54   Pulse 87   Temp 98.1 F (36.7 C)   Resp 15   Ht 6' (1.829 m)   Wt 232 lb (105.2 kg)   SpO2 96%   BMI 31.46 kg/m   Physical Exam  Constitutional: He appears well-developed and well-nourished. No distress.  HENT:  Head: Normocephalic and atraumatic.  Mouth/Throat: Oropharynx is clear and moist. No oropharyngeal exudate.  Eyes: Conjunctivae and EOM are normal. Pupils are equal, round, and reactive to light. Right eye exhibits no discharge. Left eye exhibits no discharge. No scleral icterus.  Neck: Normal range of motion. Neck supple. No JVD present. No thyromegaly present.  Cardiovascular:  Normal rate, normal heart sounds and intact distal pulses.  Exam reveals no gallop and no friction rub.   No murmur heard. No murmur, afib present, normal pulses, has bruit on Stephen [name] R  Pulmonary/Chest: Effort normal and breath sounds normal. No respiratory distress. He has no wheezes. He has no rales.  Abdominal: Soft. Bowel sounds are normal. He exhibits no distension and no mass. There is no tenderness.  Musculoskeletal: Normal range of motion. He exhibits no edema or tenderness.  Lymphadenopathy:    He has no cervical adenopathy.  Neurological: He is alert. Coordination normal.  Dense left upper extremity hemiparesis, he does have a weak grip but cannot lift his arm off Stephen [name] bed, his left leg is weaker than Stephen [name] right leg, he has a left-sided facial droop, he is able to talk but with some difficulty, he cannot track past Stephen [name] midline to Stephen [name] left, Stephen [name] left eye is rotated down into Stephen [name] right  Skin: Skin is warm and dry. No rash noted. No erythema.  Psychiatric: He has a normal mood and affect. His behavior is normal.  Nursing note and vitals reviewed.    ED Treatments / Results  Labs (all labs ordered are listed, but only abnormal results are displayed) Labs Reviewed  CBC - Abnormal; Notable for Stephen [name] following:       Result Value   RBC 3.54 (*)    Hemoglobin 10.6 (*)    HCT 33.1 (*)    RDW 15.6 (*)    All other components within normal limits  COMPREHENSIVE METABOLIC PANEL - Abnormal; Notable for Stephen [name] following:    Sodium 134 (*)    CO2 21 (*)    Glucose, Bld 188 (*)    BUN 52 (*)    Creatinine, Ser 2.29 (*)    Calcium 8.5 (*)    ALT 16 (*)    GFR calc non Af Amer 26 (*)    GFR calc Af Amer 30 (*)    All other components within normal limits  I-STAT CHEM 8, ED - Abnormal; Notable for Stephen [name] following:    Potassium 5.3 (*)    BUN 57 (*)    Creatinine, Ser 2.30 (*)    Glucose, Bld 179 (*)    Hemoglobin 10.5 (*)    HCT 31.0 (*)    All other components within normal limits  CBG  MONITORING, ED - Abnormal; Notable for Stephen [name] following:    Glucose-Capillary 160 (*)    All other components within normal limits  ETHANOL  PROTIME-INR  APTT  DIFFERENTIAL  RAPID URINE DRUG SCREEN, HOSP PERFORMED  URINALYSIS, ROUTINE W REFLEX MICROSCOPIC  I-STAT TROPOININ, ED    EKG  EKG Interpretation  Date/Time:  Friday September 04 2016 17:59:39 EST Ventricular Rate:  89 PR Interval:    QRS Duration: 81 QT Interval:  352 QTC Calculation: 429 R Axis:   32 Text Interpretation:  Atrial fibrillation Borderline T wave abnormalities Since last tracing Atrial fibrillation now present, wihtout ST abnormalities Confirmed by Sabra Heck  MD, Jerri Hargadon ([id number]) on 09/04/2016 6:14:02 PM       Radiology Ct Head Code Stroke W/o Cm  Result Date: 09/04/2016 CLINICAL DATA:  Code stroke.  Left facial droop EXAM: CT HEAD WITHOUT CONTRAST TECHNIQUE: Contiguous axial images were obtained from Stephen [name] base of Stephen [name] skull through Stephen [name] vertex without intravenous contrast. COMPARISON:  Head CT 07/05/2008 FINDINGS: Brain: No mass lesion, intraparenchymal hemorrhage or extra-axial collection. No evidence of acute cortical infarct. There is right frontal lobe encephalomalacia along Stephen [name] precentral gyrus, unchanged from Stephen [name] prior study. There is periventricular hypoattenuation compatible with chronic microvascular disease. Vascular: No hyperdense vessel or unexpected calcification. Skull: Normal visualized skull base, calvarium and extracranial soft tissues. Sinuses/Orbits: Moderate opacification of Stephen [name] right maxillary sinus and anterior ethmoid air cells. Normal orbits. ASPECTS [facility] Stroke Program Early CT Score) - Ganglionic level infarction (caudate, lentiform nuclei, internal capsule, insula, M1-M3 cortex): 7 - Supraganglionic infarction (M4-M6 cortex): 3 Total score (0-10 with 10 being normal): 10 IMPRESSION: 1. No acute intracranial abnormality. Unchanged appearance of old  right frontal infarct. 2. ASPECTS is 10. These  results were called by telephone at Stephen [name] time of interpretation on 09/04/2016 at 6:05 pm to Dr. Noemi Chapel , who verbally acknowledged these results. Electronically Signed   By: Ulyses Jarred M.D.   On: 09/04/2016 18:06    Procedures Procedures (including critical care time)  Medications Ordered in ED Medications  alteplase (ACTIVASE) 1 mg/mL injection (not administered)  alteplase (ACTIVASE) 1 mg/mL infusion 90 mg (90 mg Intravenous New Bag/Given 09/04/16 1831)    Followed by  0.9 %  sodium chloride infusion (not administered)     Initial Impression / Assessment and Plan / ED Course  I have reviewed Stephen [name] triage vital signs and Stephen [name] nursing notes.  Pertinent labs & imaging results that were available during my care of Stephen [name] [name] were reviewed by me and considered in my medical decision making (see chart for details).  Clinical Course    Stephen [name] [name] is acutely ill with what appears to be an ischemic stroke, there does not appear to be any hemorrhage on Stephen [name] CT scan, Stephen [name] code stroke was activated immediately upon Stephen [name] [name]'s arrival. Based on his prior workup I do not see that he has any absolute contraindications to TPA, Stephen [name] stroke neurologist has been made aware by Stephen [name] clerical staff  Teleneurology examining pt at this time - 6:10 PM  Labs show that Stephen [name] pt has K of 5.3, Cr of 2.3, Hgb of 10.5, all baseline.  Has CBC with no leu,ocytosis.  CT neg for acute findings - has old R sided MCA territory stroke.  ECG shows new onset afib.  BP on arrival < [id number] systolic  D/w Dr. Earleen Newport - requests CT angio head and neck - this was deferred to receiving hospital due to needign emergeny transport by [place] transport  D/w Dr. Aram Beecham who will accept to Stephen [name] Neuro ICU.  CRITICAL CARE Performed by: Johnna Acosta Total critical care time: 35 minutes Critical care time was exclusive of separately billable procedures and treating other patients. Critical care was necessary to  treat or prevent imminent or life-threatening deterioration. Critical care was time spent personally by me on Stephen [name] following activities: development of treatment plan with [name] and/or surrogate as well as nursing, discussions with consultants, evaluation of [name]'s response to treatment, examination of [name], obtaining history from [name] or surrogate, ordering and performing treatments and interventions, ordering and review of laboratory studies, ordering and review of radiographic studies, pulse oximetry and re-evaluation of [name]'s condition.   Vitals:   09/04/16 1802 09/04/16 1804 09/04/16 1805  BP: 144/85    Pulse: 85    Resp: 18    Temp: 98.6 F (37 C)    TempSrc: Oral    SpO2: 98%  98%  Weight:  232 lb (105.2 kg)   Height:  6' (1.829 m)       Final Clinical Impressions(s) / ED Diagnoses   Final diagnoses:  Acute ischemic stroke [facility])    New Prescriptions New Prescriptions   No medications on file     Noemi Chapel, MD 09/04/16 1910

## 2016-09-04 NOTE — ED Notes (Signed)
Family made aware of pts bed assignment 

## 2016-09-04 NOTE — ED Notes (Signed)
Report called and given to [place] on [place]

## 2016-09-04 NOTE — ED Provider Notes (Signed)
75 year old male transferred from [facility] for CT angiogram and possible endovascular procedure for stroke. He is resting comfortably and in no acute distress. Vital signs are normal. Neurologist, Dr. Cheral Marker, has been in to see the patient.   Delora Fuel, MD [phone]

## 2016-09-04 NOTE — Consult Note (Addendum)
Admission H&P    Chief Complaint: Stroke s/p IV tPA at OSH  HPI: Stephen [name] is an 75 y.o. male who presented to [facility] on Friday with left sided face, arm and leg weakness. His speech was also slurred. He was last known normal at 5:00 PM. The patient stated at EMS arrival that he takes ASA, but was difficult to understand. He was administered IV tPA at OSH and then transported to [facility] for CTA and possible endovascular procedure.   He has a PMHx of carotid artery occlusion, stroke in 2001, bilateral endarterectomies, cardiac disease, HTN, HLD and PVD.   LSN: 5:00 PM tPA Given: Yes, at outside hospital Endovascular procedure undertaken: No. CTA is negative for LVO   Past Medical History:  Diagnosis Date  . Anemia 06/01/2012   H&H of 10/30 in 8/13 with normal MCV   . B12 deficiency 08/17/2016  . Carotid artery occlusion   . Celiac disease   . Cerebrovascular disease    H/o CVA In 2001 followed by a right carotid endarterectomy; left carotid endarterectomy in 2008  . Coronary atherosclerosis of native coronary artery    PTCA of LAD in 1994; total obstruction of the RCA treated medically; subsequent stress nuclear study with inferior ischemia  . DDD (degenerative disc disease), lumbar    Lumbar surgery in 1984  . Diabetes mellitus type II   . Essential hypertension, benign   . Hyperlipidemia   . Peripheral vascular disease ([place])   . Stroke [facility]) 2001   Right brain    Past Surgical History:  Procedure Laterality Date  . APPENDECTOMY  1970  . CAROTID ENDARTERECTOMY  2008   Left   . CAROTID ENDARTERECTOMY  06/15/2000   Right  . COLONOSCOPY N/A 10/10/2015   Procedure: COLONOSCOPY;  Surgeon: Rogene Houston, MD;  Location: [facility];  Service: Endoscopy;  Laterality: N/A;  12:25 - moved to 11:15 - Ann to notify  . [place] SURGERY  2000, 2001   L5 HNP required 2 surgical procedures    Family History  Problem Relation Age of Onset  . Heart disease Mother      Before age 76  . Diabetes Mother   . Heart attack Mother   . Diabetes Sister   . Heart disease Brother   . Diabetes Brother   . Diabetes Daughter   . Diabetes Son   . Hyperlipidemia Son    Social History:  reports that he quit smoking about 43 years ago. His smoking use included Cigarettes. He has a 30.00 pack-year smoking history. He has never used smokeless tobacco. He reports that he does not drink alcohol or use drugs.  Allergies: No Known Allergies  Medications: Current Facility-Administered Medications on File Prior to Encounter  Medication Dose Route Frequency Provider Last Rate Last Dose  . 0.9 %  sodium chloride infusion   Intravenous Continuous Baird Cancer, PA-C 10 mL/hr at 08/31/16 1610    . acetaminophen (TYLENOL) tablet 650 mg  650 mg Oral Once [name], MD       Current Outpatient Prescriptions on File Prior to Encounter  Medication Sig Dispense Refill  . acetaminophen (TYLENOL) 500 MG tablet Take 500 mg by mouth every 6 (six) hours as needed.    Marland Kitchen amLODipine (NORVASC) 5 MG tablet Take 5 mg by mouth daily.    Marland Kitchen aspirin 81 MG tablet Take 81 mg by mouth daily.    Marland Kitchen atenolol (TENORMIN) 50 MG tablet Take 50  mg by mouth daily.      Marland Kitchen doxazosin (CARDURA) 2 MG tablet Take 2 mg by mouth daily.    . furosemide (LASIX) 40 MG tablet TAKE 1 AND 1/2 TABLET (60 MG) IN THE AM AND TAKE 1 TABLET (40 MG) IN THE EVENING 225 tablet 2  . insulin aspart (NOVOLOG) 100 UNIT/ML injection Inject 25 Units into the skin 2 (two) times daily.     . insulin detemir (LEVEMIR) 100 UNIT/ML injection Inject 65 Units into the skin at bedtime.    . Lansoprazole (PREVACID PO) Take by mouth as needed.    Marland Kitchen losartan (COZAAR) 25 MG tablet Take 25 mg by mouth daily.    . pravastatin (PRAVACHOL) 40 MG tablet Take 40 mg by mouth daily.      ROS: Does not endorse fever or chills. No headache, chest pain, abdominal pain or limb pain.   Physical Examination: Blood pressure 115/57, pulse 76,  temperature 98.1 F (36.7 C), resp. rate 21, height 6' (1.829 m), weight 105.2 kg (232 lb), SpO2 96 %.  HEENT-  Normocephalic/atraumatic.Neck supple. Lungs - No gross wheezing. Respirations unlabored.  Extremities - Warm and well perfused  Neurologic Examination: Ment: Awake, drowsy. Dysarthric. Speech is fluent with intact comprehension and naming.  CN: PERRL. Visual fields intact. Exotropia (chronic). Decreased ocular motility on right. Left ocular motility normal. Facial sensation intact to temperature bilaterally. Left facial droop. Hearing intact to questions and commands. IX/X: Hypophonic with pharyngeal dysarthria. XI: Left sided weakness.  Motor: LUE 1/5, LLE 3/5, RUE 5/5, RLE 5/5. Sensory: Intact to fine touch bilaterally without extinction.  Reflexes: Hypoactive achilles bilaterally. Normoactive patellar, brachioradialis and biceps reflexes.  Cerebellar: No ataxia with FNF on right. Unable to perform on left.  Gait: Unable to assess.   Results for orders placed or performed during the hospital encounter of 09/04/16 (from the past 48 hour(s))  Ethanol     Status: None   Collection Time: 09/04/16  5:55 PM  Result Value Ref Range   Alcohol, Ethyl (B) <5 <5 mg/dL    Comment:        LOWEST DETECTABLE LIMIT FOR SERUM ALCOHOL IS 5 mg/dL FOR MEDICAL PURPOSES ONLY   Protime-INR     Status: None   Collection Time: 09/04/16  5:55 PM  Result Value Ref Range   Prothrombin Time 14.7 11.4 - 15.2 seconds   INR 1.14   APTT     Status: None   Collection Time: 09/04/16  5:55 PM  Result Value Ref Range   aPTT 36 24 - 36 seconds  CBC     Status: Abnormal   Collection Time: 09/04/16  5:55 PM  Result Value Ref Range   WBC 7.1 4.0 - 10.5 K/uL   RBC 3.54 (L) 4.22 - 5.81 MIL/uL   Hemoglobin 10.6 (L) 13.0 - 17.0 g/dL   HCT 33.1 (L) 39.0 - 52.0 %   MCV 93.5 78.0 - 100.0 fL   MCH 29.9 26.0 - 34.0 pg   MCHC 32.0 30.0 - 36.0 g/dL   RDW 15.6 (H) 11.5 - 15.5 %   Platelets 167 150 - 400 K/uL   Differential     Status: None   Collection Time: 09/04/16  5:55 PM  Result Value Ref Range   Neutrophils Relative % 65 %   Neutro Abs 4.7 1.7 - 7.7 K/uL   Lymphocytes Relative 22 %   Lymphs Abs 1.6 0.7 - 4.0 K/uL   Monocytes Relative 10 %   Monocytes  Absolute 0.7 0.1 - 1.0 K/uL   Eosinophils Relative 2 %   Eosinophils Absolute 0.2 0.0 - 0.7 K/uL   Basophils Relative 1 %   Basophils Absolute 0.0 0.0 - 0.1 K/uL  Comprehensive metabolic panel     Status: Abnormal   Collection Time: 09/04/16  5:55 PM  Result Value Ref Range   Sodium 134 (L) 135 - 145 mmol/L   Potassium 5.0 3.5 - 5.1 mmol/L   Chloride 105 101 - 111 mmol/L   CO2 21 (L) 22 - 32 mmol/L   Glucose, Bld 188 (H) 65 - 99 mg/dL   BUN 52 (H) 6 - 20 mg/dL   Creatinine, Ser 2.29 (H) 0.61 - 1.24 mg/dL   Calcium 8.5 (L) 8.9 - 10.3 mg/dL   Total Protein 7.7 6.5 - 8.1 g/dL   Albumin 3.5 3.5 - 5.0 g/dL   AST 17 15 - 41 U/L   ALT 16 (L) 17 - 63 U/L   Alkaline Phosphatase 61 38 - 126 U/L   Total Bilirubin 0.3 0.3 - 1.2 mg/dL   GFR calc non Af Amer 26 (L) >60 mL/min   GFR calc Af Amer 30 (L) >60 mL/min    Comment: (NOTE) The eGFR has been calculated using the CKD EPI equation. This calculation has not been validated in all clinical situations. eGFR's persistently <60 mL/min signify possible Chronic Kidney Disease.    Anion gap 8 5 - 15  CBG monitoring, ED     Status: Abnormal   Collection Time: 09/04/16  6:00 PM  Result Value Ref Range   Glucose-Capillary 160 (H) 65 - 99 mg/dL  I-Stat Chem 8, ED  (not at [facility], [facility])     Status: Abnormal   Collection Time: 09/04/16  6:03 PM  Result Value Ref Range   Sodium 138 135 - 145 mmol/L   Potassium 5.3 (H) 3.5 - 5.1 mmol/L   Chloride 107 101 - 111 mmol/L   BUN 57 (H) 6 - 20 mg/dL   Creatinine, Ser 2.30 (H) 0.61 - 1.24 mg/dL   Glucose, Bld 179 (H) 65 - 99 mg/dL   Calcium, Ion 1.20 1.15 - 1.40 mmol/L   TCO2 23 0 - 100 mmol/L   Hemoglobin 10.5 (L) 13.0 - 17.0 g/dL   HCT 31.0 (L)  39.0 - 52.0 %  I-stat troponin, ED (not at [facility], [facility])     Status: None   Collection Time: 09/04/16  6:03 PM  Result Value Ref Range   Troponin i, poc 0.00 0.00 - 0.08 ng/mL   Comment 3            Comment: Due to the release kinetics of cTnI, a negative result within the first hours of the onset of symptoms does not rule out myocardial infarction with certainty. If myocardial infarction is still suspected, repeat the test at appropriate intervals.    Ct Angio Head W Or Wo Contrast  Result Date: 09/04/2016 CLINICAL DATA:  Status post tPA administration. Left-sided weakness and numbness. EXAM: CT ANGIOGRAPHY HEAD TECHNIQUE: Multidetector CT imaging of the head was performed using the standard protocol during bolus administration of intravenous contrast. Multiplanar CT image reconstructions and MIPs were obtained to evaluate the vascular anatomy. CONTRAST:  50 mL Isovue 370 IV COMPARISON:  Head CT 09/04/2016 Brain MRI 07/05/2008 FINDINGS: CTA HEAD Right frontal lobe encephalomalacia is again noted. No acute hemorrhage. Anterior circulation: --Intracranial internal carotid arteries: Normal. --Anterior cerebral arteries: Normal. --Middle cerebral arteries: Normal. --Posterior communicating arteries: Absent bilaterally. Posterior  circulation: --Posterior cerebral arteries: Normal. --Superior cerebellar arteries: Normal. --Basilar artery: Normal. --Anterior inferior cerebellar arteries: Normal. --Posterior inferior cerebellar arteries: Normal. --Vertebral arteries: There is mild atherosclerotic calcification of both vertebral artery V4 segments without hemodynamically significant stenosis. Venous sinuses: As permitted by contrast timing, patent. Anatomic variants: None IMPRESSION: 1. No intracranial arterial occlusion or high-grade stenosis. 2. Right frontal lobe encephalomalacia secondary to remote infarct. 3. No acute hemorrhage. Electronically Signed   By: Ulyses Jarred M.D.   On: 09/04/2016 21:15   Ct  Head Code Stroke W/o Cm  Result Date: 09/04/2016 CLINICAL DATA:  Code stroke.  Left facial droop EXAM: CT HEAD WITHOUT CONTRAST TECHNIQUE: Contiguous axial images were obtained from the base of the skull through the vertex without intravenous contrast. COMPARISON:  Head CT 07/05/2008 FINDINGS: Brain: No mass lesion, intraparenchymal hemorrhage or extra-axial collection. No evidence of acute cortical infarct. There is right frontal lobe encephalomalacia along the precentral gyrus, unchanged from the prior study. There is periventricular hypoattenuation compatible with chronic microvascular disease. Vascular: No hyperdense vessel or unexpected calcification. Skull: Normal visualized skull base, calvarium and extracranial soft tissues. Sinuses/Orbits: Moderate opacification of the right maxillary sinus and anterior ethmoid air cells. Normal orbits. ASPECTS [facility] Stroke Program Early CT Score) - Ganglionic level infarction (caudate, lentiform nuclei, internal capsule, insula, M1-M3 cortex): 7 - Supraganglionic infarction (M4-M6 cortex): 3 Total score (0-10 with 10 being normal): 10 IMPRESSION: 1. No acute intracranial abnormality. Unchanged appearance of old right frontal infarct. 2. ASPECTS is 10. These results were called by telephone at the time of interpretation on 09/04/2016 at 6:05 pm to Dr. Noemi Chapel , who verbally acknowledged these results. Electronically Signed   By: Ulyses Jarred M.D.   On: 09/04/2016 18:06    Assessment: 75 y.o. male with exam findings and overall presentation most consistent with right MCA stroke. CT head reveals no acute intracranial abnormality, with unchanged appearance of old right frontal infarct. CTA head reveals no intracranial arterial occlusion or high-grade stenosis. .  1. Has received IV tPA at OSH.  2. Not an endovascular candidate as no LVO seen on CTA.  3. Classifiable as having failed ASA monotherapy.  4. Stroke Risk Factors - age, PVD, HTN,HLD,  prior stroke,  DM2. 5. Vitamin B12 deficiency. B12 level drawn on 11/21 was 176.   Plan: 1. Admitted to MICU under stroke/neurology team.  2. Post-tPA orders to include frequent neuro checks and BP management.  3. No antiplatelet medications or anticoagulants. DVT prophylaxis with SCDs.  4. Repeat CT head in 24 hours. If negative for hemorrhagic conversion, start Plavix.  5. Continue pravastatin.  6. PT consult, OT consult, Speech consult 7. MRI  of the brain without contrast 8. Echocardiogram 9. Carotid dopplers 10. Telemetry monitoring 11. HgbA1c, fasting lipid panel 12. Start vitamin B12 supplementation 2000 mcg po qd.  13. Insulin sliding scale. 14. NPO for now.   50 minutes spent in the evaluation and management of this critically ill patient.  Electronically signed: Dr. Kerney Elbe 09/04/2016, 10:03 PM

## 2016-09-04 NOTE — ED Notes (Addendum)
Pt arrives to [facility] via [facility]; report received from [facility] staff

## 2016-09-05 ENCOUNTER — Inpatient Hospital Stay (HOSPITAL_COMMUNITY): Payer: PPO

## 2016-09-05 DIAGNOSIS — I6300 Cerebral infarction due to thrombosis of unspecified precerebral artery: Secondary | ICD-10-CM

## 2016-09-05 DIAGNOSIS — I6789 Other cerebrovascular disease: Secondary | ICD-10-CM

## 2016-09-05 LAB — URINALYSIS, ROUTINE W REFLEX MICROSCOPIC
BILIRUBIN URINE: NEGATIVE
GLUCOSE, UA: NEGATIVE mg/dL
HGB URINE DIPSTICK: NEGATIVE
KETONES UR: NEGATIVE mg/dL
Leukocytes, UA: NEGATIVE
Nitrite: NEGATIVE
PH: 5 (ref 5.0–8.0)
Protein, ur: NEGATIVE mg/dL
Specific Gravity, Urine: 1.017 (ref 1.005–1.030)

## 2016-09-05 LAB — GLUCOSE, CAPILLARY
Glucose-Capillary: 120 mg/dL — ABNORMAL HIGH (ref 65–99)
Glucose-Capillary: 208 mg/dL — ABNORMAL HIGH (ref 65–99)
Glucose-Capillary: 161 mg/dL — ABNORMAL HIGH (ref 65–99)
Glucose-Capillary: 155 mg/dL — ABNORMAL HIGH (ref 65–99)

## 2016-09-05 LAB — RAPID URINE DRUG SCREEN, HOSP PERFORMED
Amphetamines: NOT DETECTED
Barbiturates: NOT DETECTED
Benzodiazepines: NOT DETECTED
Cocaine: NOT DETECTED
Opiates: NOT DETECTED
Tetrahydrocannabinol: NOT DETECTED

## 2016-09-05 LAB — MRSA PCR SCREENING: MRSA by PCR: NEGATIVE

## 2016-09-05 LAB — LIPID PANEL
CHOL/HDL RATIO: 3.3 ratio
CHOLESTEROL: 121 mg/dL (ref 0–200)
HDL: 37 mg/dL — AB (ref >40)
LDL Cholesterol: 33 mg/dL (ref 0–99)
TRIGLYCERIDES: 254 mg/dL — AB (ref <150)
VLDL: 51 mg/dL — AB (ref 0–40)

## 2016-09-05 LAB — ECHOCARDIOGRAM COMPLETE
Height: 72 in
Weight: 3712 oz

## 2016-09-05 MED ORDER — INSULIN ASPART 100 UNIT/ML ~~LOC~~ SOLN: 0.0000 [IU] | Freq: Every day | SUBCUTANEOUS | Status: DC

## 2016-09-05 MED ORDER — VITAMIN B-12 1000 MCG PO TABS
2000.0000 ug | ORAL_TABLET | Freq: Every day | ORAL | Status: DC
  Administered 2016-09-05 – 2016-09-08 (×4): 2000 ug via ORAL
  Filled 2016-09-05 (×4): qty 2

## 2016-09-05 MED ORDER — PRAVASTATIN SODIUM 40 MG PO TABS
40.0000 mg | ORAL_TABLET | Freq: Every day | ORAL | Status: DC
  Administered 2016-09-05 – 2016-09-08 (×4): 40 mg via ORAL
  Filled 2016-09-05 (×4): qty 1

## 2016-09-05 MED ORDER — ASPIRIN 325 MG PO TABS
325.0000 mg | ORAL_TABLET | Freq: Every day | ORAL | Status: DC
  Administered 2016-09-05 – 2016-09-07 (×3): 325 mg via ORAL
  Filled 2016-09-05 (×3): qty 1

## 2016-09-05 MED ORDER — LORAZEPAM 2 MG/ML IJ SOLN
1.0000 mg | Freq: Once | INTRAMUSCULAR | Status: AC
  Administered 2016-09-06: 1 mg via INTRAVENOUS
  Filled 2016-09-05: qty 1

## 2016-09-05 MED ORDER — LORAZEPAM BOLUS VIA INFUSION: 1.0000 mg | Freq: Once | INTRAVENOUS | Status: DC

## 2016-09-05 MED ORDER — INSULIN ASPART 100 UNIT/ML ~~LOC~~ SOLN
0.0000 [IU] | Freq: Three times a day (TID) | SUBCUTANEOUS | Status: DC
  Administered 2016-09-05: 5 [IU] via SUBCUTANEOUS
  Administered 2016-09-05: 3 [IU] via SUBCUTANEOUS
  Administered 2016-09-06: 2 [IU] via SUBCUTANEOUS
  Administered 2016-09-06 (×2): 3 [IU] via SUBCUTANEOUS
  Administered 2016-09-07: 2 [IU] via SUBCUTANEOUS
  Administered 2016-09-07: 3 [IU] via SUBCUTANEOUS
  Administered 2016-09-07: 5 [IU] via SUBCUTANEOUS
  Administered 2016-09-08 (×2): 2 [IU] via SUBCUTANEOUS

## 2016-09-05 MED ORDER — FUROSEMIDE 20 MG PO TABS
20.0000 mg | ORAL_TABLET | Freq: Two times a day (BID) | ORAL | Status: DC
  Administered 2016-09-06 – 2016-09-08 (×5): 20 mg via ORAL
  Filled 2016-09-05 (×6): qty 1

## 2016-09-05 NOTE — Progress Notes (Signed)
STROKE TEAM PROGRESS NOTE   HISTORY OF PRESENT ILLNESS (per record) Stephen [name] is an 75 y.o. male who presented to [facility] on Friday with left sided face, arm and leg weakness. His speech was also slurred. He was last known normal at 5:00 PM. The patient stated at EMS arrival that he takes ASA, but was difficult to understand. He was administered IV tPA at OSH and then transported to [facility] for CTA and possible endovascular procedure.   He has a PMHx of carotid artery occlusion, stroke in 2001, bilateral endarterectomies, cardiac disease, HTN, HLD and PVD.    LSN: 5:00 PM tPA Given: Yes, at outside hospital Endovascular procedure undertaken: No. CTA is negative for LVO   SUBJECTIVE (INTERVAL HISTORY) No family members present. The speech therapist reports that the patient passed his swallowing evaluation. The patient states that his presenting symptom was vertigo. He is feeling much better now. He has had previous strokes with residual deficits.   OBJECTIVE Temp:  [98.1 F (36.7 C)-98.6 F (37 C)] 98.5 F (36.9 C) (12/16 1200) Pulse Rate:  [42-127] 76 (12/16 1200) Cardiac Rhythm: Atrial fibrillation (12/16 0800) Resp:  [13-24] 18 (12/16 1200) BP: (92-182)/(44-166) 144/56 (12/16 1200) SpO2:  [94 %-100 %] 96 % (12/16 0900) Weight:  [105.2 kg (232 lb)] 105.2 kg (232 lb) (12/15 1804)  CBC:   Recent Labs Lab 09/04/16 1755 09/04/16 1803  WBC 7.1  --   NEUTROABS 4.7  --   HGB 10.6* 10.5*  HCT 33.1* 31.0*  MCV 93.5  --   PLT 167  --     Basic Metabolic Panel:   Recent Labs Lab 09/04/16 1755 09/04/16 1803  NA 134* 138  K 5.0 5.3*  CL 105 107  CO2 21*  --   GLUCOSE 188* 179*  BUN 52* 57*  CREATININE 2.29* 2.30*  CALCIUM 8.5*  --     Lipid Panel:     Component Value Date/Time   CHOL 121 09/05/2016 0252   TRIG 254 (H) 09/05/2016 0252   HDL 37 (L) 09/05/2016 0252   CHOLHDL 3.3 09/05/2016 0252   VLDL 51 (H) 09/05/2016 0252   LDLCALC 33 09/05/2016  0252   HgbA1c:  Lab Results  Component Value Date   HGBA1C (H) 07/05/2008    7.2 (NOTE)   The ADA recommends the following therapeutic goal for glycemic   control related to Hgb A1C measurement:   Goal of Therapy:   < 7.0% Hgb A1C   Reference: American Diabetes Association: Clinical Practice   Recommendations 2008, Diabetes Care,  2008, 31:(Suppl 1).   Urine Drug Screen:     Component Value Date/Time   LABOPIA NONE DETECTED 09/05/2016 0530   COCAINSCRNUR NONE DETECTED 09/05/2016 0530   LABBENZ NONE DETECTED 09/05/2016 0530   AMPHETMU NONE DETECTED 09/05/2016 0530   THCU NONE DETECTED 09/05/2016 0530   LABBARB NONE DETECTED 09/05/2016 0530      IMAGING  Ct Angio Head W Or Wo Contrast 09/04/2016 1. No intracranial arterial occlusion or high-grade stenosis.  2. Right frontal lobe encephalomalacia secondary to remote infarct.  3. No acute hemorrhage.    Ct Head Code Stroke W/o Cm  09/04/2016 1. No acute intracranial abnormality. Unchanged appearance of old right frontal infarct.  2. ASPECTS is 10.   MRI / MRA Head / Brain  09/05/2016 Pending     PHYSICAL EXAM  HEENT-  Normocephalic/atraumatic.Neck supple soft right carotid bruit heared. Lungs - No gross wheezing. Respirations unlabored.  Extremities -  Warm and well perfused  Neurologic Examination: Ment: Awake, drowsy. Dysarthric. Speech is fluent with intact comprehension and naming.  CN: PERRL. Visual fields intact. Exotropia (chronic). Decreased ocular motility on right. Left ocular motility normal. Facial sensation intact to temperature bilaterally. Left lower facial droop. Hearing intact to questions and commands. IX/X: Hypophonic with pharyngeal dysarthria. XI: Left sided weakness.  Motor: LUE 1/5, LLE 3/5, RUE 5/5, [email or web address] [email or web address] left hand weakness and diminished fine finger  movements. Sensory: Intact to fine touch bilaterally without extinction.  Reflexes: Hypoactive achilles bilaterally. Normoactive  patellar, brachioradialis and biceps reflexes.  Cerebellar: No ataxia with FNF on right. Unable to perform on left.  Gait: Unable to assess.     ASSESSMENT/PLAN Stephen [name] is a 75 y.o. male with history of carotid artery occlusion, stroke in 2001, bilateral endarterectomies, cardiac disease, DM, CAD, B 12 deficiency, HTN, HLD and PVD.  presenting with left hemiparesis, slurred speech and possible dizziness.  He received IV TPA at [facility] 09/04/2016 at [place].  Stroke:  Non-dominant infarct possibly embolic from carotid artery disease.  Resultant  Mild dysarthria and left sided weakness  MRI - pending  MRA - pending  Carotid Doppler - pending  2D Echo - pending  LDL - 33  HgbA1c - pending  VTE prophylaxis - SCDs Diet regular Room service appropriate? Yes; Fluid consistency: Thin  aspirin 81 mg daily prior to admission, now on No antithrombotic - Await F/U imaging.  Patient counseled to be compliant with his antithrombotic medications  Ongoing aggressive stroke risk factor management  Therapy recommendations: Home health physical therapy recommended with 24 hour per day supervision.  Disposition: Pending  Hypertension  Blood pressure for the most part stable except for an occasional low blood pressure.  Permissive hypertension (OK if < 220/120) but gradually normalize in 5-7 days  Long-term BP goal normotensive  Hyperlipidemia  Home meds:  Pravachol 40 mg daily resumed in hospital  LDL 33, goal < 70  Continue statin.  Diabetes  HgbA1c pending, goal < 7.0  Uncontrolled  Other Stroke Risk Factors  Advanced age  The patient quit smoking 43 years ago.  Obesity, Body mass index is 31.46 kg/m., recommend weight loss, diet and exercise as appropriate   Hx stroke/TIA  Coronary artery disease   Other Active Problems  Acute on chronic renal failure - BUN 52 / creatinine 2.29 / GFR 26 (on Lasix 20 mg twice daily)  Anemia - hemoglobin  10.5 ; hematocrit 31  Hypokalemia - 5.0   PLAN  Await MRI - start aspirin 325 mg daily if no hemorrhage  Repeat the Bmet in a.m.  Should be able to transfer to the floor tomorrow if patient remains stable.  Hospital day # 1  Mikey Bussing PA-C [facility] Pager [phone] 09/05/2016, 1:24 PM I have personally examined this patient, reviewed notes, independently viewed imaging studies, participated in medical decision making and plan of care.ROS completed by me personally and pertinent positives fully documented  I have made any additions or clarifications directly to the above note. Agree with note above. He presented with slurred speech and left-sided weak and was given IV TPA and has shown improvement. Container straight blood pressure control and close neurological monitoring. Check MRI scan of the brain. Aspirin after  MRI. Family not available for discussion This patient is critically ill and at significant risk of neurological worsening, death and care requires constant monitoring of vital signs, hemodynamics,respiratory and cardiac monitoring, extensive review  of multiple databases, frequent neurological assessment, discussion with family, other specialists and medical decision making of high complexity.I have made any additions or clarifications directly to the above note.This critical care time does not reflect procedure time, or teaching time or supervisory time of PA/NP/Med Resident etc but could involve care discussion time.  I spent 35 minutes of neurocritical care time  in the care of  this patient.      Antony Contras, MD Medical Director [facility] Pager: [phone] 09/05/2016 4:16 PM   To contact Stroke Continuity provider, please refer to [email or web address] After hours, contact General Neurology

## 2016-09-05 NOTE — Progress Notes (Signed)
  Echocardiogram 2D Echocardiogram has been performed.  Stephen [name] 09/05/2016, 2:11 PM

## 2016-09-05 NOTE — Progress Notes (Signed)
Transfer report from 59M received at 2255 and pt arrived to the unit via wheelchair with belongings to the side at 2330. Pt A&O x4; VSS; telemetry applied and verified with CCMD; NT called to second verify. Pt oriented to the unit and room; fall/safety prevention and precaution education completed with pt and pt voices understanding. No pressure ulcers or open wounds noted on pt. Pt sleeping quietly with call light within reach. Will continue to monitor. Delia Heady RN

## 2016-09-05 NOTE — Evaluation (Addendum)
Speech Language Pathology Evaluation Patient Details Name: Stephen [name] MRN: [id number] DOB: 10/08/1940 Today's Date: 09/05/2016 Time: [id number] SLP Time Calculation (min) (ACUTE ONLY): 22 min  Problem List:  Patient Active Problem List   Diagnosis Date Noted  . Stroke (cerebrum) ([place]) 09/04/2016  . B12 deficiency 08/17/2016  . PAD (peripheral artery disease) ([place]) 01/21/2016  . Irregular heartbeat 07/19/2014  . Atherosclerotic PVD with intermittent claudication ([place]) 07/17/2014  . Carotid stenosis 07/12/2014  . Aftercare following surgery of the circulatory system 07/12/2014  . Chronic diastolic heart failure ([place]) 09/27/2013  . Occlusion and stenosis of carotid artery without mention of cerebral infarction 06/28/2012  . Anemia 06/01/2012  . Chronic renal disease, stage 3, moderately decreased glomerular filtration rate between 30-59 mL/min/1.73 square meter 06/01/2012  . Diabetes mellitus type II   . Essential hypertension, benign   . Hyperlipidemia   . Cerebrovascular disease   . Coronary atherosclerosis of native coronary artery   . Peripheral vascular disease [facility])    Past Medical History:  Past Medical History:  Diagnosis Date  . Anemia 06/01/2012   H&H of 10/30 in 8/13 with normal MCV   . B12 deficiency 08/17/2016  . Carotid artery occlusion   . Celiac disease   . Cerebrovascular disease    H/o CVA In 2001 followed by a right carotid endarterectomy; left carotid endarterectomy in 2008  . Coronary atherosclerosis of native coronary artery    PTCA of LAD in 1994; total obstruction of the RCA treated medically; subsequent stress nuclear study with inferior ischemia  . DDD (degenerative disc disease), lumbar    Lumbar surgery in 1984  . Diabetes mellitus type II   . Essential hypertension, benign   . Hyperlipidemia   . Peripheral vascular disease ([place])   . Stroke [facility]) 2001   Right brain   Past Surgical History:  Past Surgical History:  Procedure Laterality  Date  . APPENDECTOMY  1970  . CAROTID ENDARTERECTOMY  2008   Left   . CAROTID ENDARTERECTOMY  06/15/2000   Right  . COLONOSCOPY N/A 10/10/2015   Procedure: COLONOSCOPY;  Surgeon: Rogene Houston, MD;  Location: [facility];  Service: Endoscopy;  Laterality: N/A;  12:25 - moved to 11:15 - Ann to notify  . [place] SURGERY  2000, 2001   L5 HNP required 2 surgical procedures   HPI:  75 y.o. male who presented to [facility] on 09/04/16 with left sided face, arm and leg weakness. His speech was also slurred. He was last known normal at 5:00 PM. The patient stated at EMS arrival that he takes ASA, but was difficult to understand. He was administered IV tPA at OSH and then transported to [facility] for CTA and possible endovascular procedure.  He has a PMHx of carotid artery occlusion, stroke in 2001, bilateral endarterectomies, cardiac disease, HTN, HLD and PVD.  Assessment / Plan / Recommendation Clinical Impression   Pt with baseline deficits from prior CVA's including slight left facial hemiparesis, but functional for swallowing/speaking purposes; able to follow multi-step commands WFL, cognition appears [facility] for tasks assessed, but a f/u cognitive assessment may be considered pending test results/baseline cognitive status; vocal quality low, but articulation precise (100% accurate) with simple conversation and expressive communication also [facility]. No f/u in house recommended for ST at this time.    SLP Assessment  All further Speech Language Pathology  needs can be addressed in the next venue of care prn   Follow Up Recommendations  Other (comment) (  possible f/u cognitive assessment prn)    Frequency and Duration   n/a        SLP Evaluation Cognition  Overall Cognitive Status: Within Functional Limits for tasks assessed Arousal/Alertness: Awake/alert Orientation Level: Oriented X4 Memory: Appears intact Awareness: Appears intact Problem Solving: Appears intact Safety/Judgment:  Appears intact       Comprehension  Auditory Comprehension Overall Auditory Comprehension: Appears within functional limits for tasks assessed Yes/No Questions: Within Functional Limits Commands: Within Functional Limits Conversation: Simple Visual Recognition/Discrimination Discrimination: Not tested Reading Comprehension Reading Status: Not tested    Expression Expression Primary Mode of Expression: Verbal Verbal Expression Overall Verbal Expression: Appears within functional limits for tasks assessed Initiation: No impairment Level of Generative/Spontaneous Verbalization: Conversation Repetition: No impairment Naming: No impairment Pragmatics: No impairment Non-Verbal Means of Communication: Not applicable Written Expression Dominant Hand: Right Written Expression: Not tested   Oral / Motor  Oral Motor/Sensory Function Overall Oral Motor/Sensory Function: Other (comment) (left facial weakness from previous CVA; minimal) Facial ROM: Reduced left Facial Symmetry: Abnormal symmetry left;Other (Comment) (slight) Lingual ROM: Reduced left;Other (Comment) (minimal) Lingual Symmetry: Abnormal symmetry left;Other (Comment) (slight) Lingual Sensation: Within Functional Limits Velum: Within Functional Limits Mandible: Within Functional Limits Motor Speech Overall Motor Speech: Appears within functional limits for tasks assessed Respiration: Within functional limits Phonation: Low vocal intensity Resonance: Within functional limits Articulation: Within functional limitis Intelligibility: Intelligible Motor Planning: Witnin functional limits Motor Speech Errors: Not applicable             Functional Assessment Tool Used: NOMS Functional Limitations: Spoken language comprehension Spoken Language Comprehension Current Status [phone]): At least 1 percent but less than 20 percent impaired, limited or restricted Spoken Language Comprehension Goal Status [phone]): At least 1 percent  but less than 20 percent impaired, limited or restricted Spoken Language Comprehension Discharge Status [phone]): At least 1 percent but less than 20 percent impaired, limited or restricted         ADAMS,PAT, M.S., CCC-SLP 09/05/2016, 9:27 AM

## 2016-09-05 NOTE — Evaluation (Signed)
Physical Therapy Evaluation Patient Details Name: Stephen [name] MRN: [id number] DOB: [birth year] Today's Date: 09/05/2016   History of Present Illness  75 y.o. male who presented to [facility] on 09/04/16 with left sided face, arm and leg weakness. His speech was also slurred. He was last known normal at 5:00 PM. The patient stated at EMS arrival that he takes ASA, but was difficult to understand. He was administered IV tPA at OSH and then transported to [facility] for CTA and possible endovascular procedure.  He has a PMHx of carotid artery occlusion, stroke in 2001, bilateral endarterectomies, cardiac disease, HTN, HLD and PVD.  Clinical Impression  Patient demonstrates deficits in functional mobility as indicated below. Will need continued skilled PT to address deficits and maximize function. Will see as indicated and progress as tolerated. At this time, patient with some noted instability and several overt LOB during ambulation, recommend use or RW, HHPT and initial 24/7 supervision upon discharge.    Follow Up Recommendations Home health PT;Supervision/Assistance - 24 hour    Equipment Recommendations  Rolling walker with 5" wheels    Recommendations for Other Services       Precautions / Restrictions Precautions Precautions: Fall Restrictions Weight Bearing Restrictions: No      Mobility  Bed Mobility Overal bed mobility: Needs Assistance Bed Mobility: Supine to Sit     Supine to sit: Min guard     General bed mobility comments: increased time ot come to EOB, min guard for safety and line management  Transfers Overall transfer level: Needs assistance   Transfers: Sit to/from Stand Sit to Stand: Min guard         General transfer comment: min guard for safety, no physical assist provided  Ambulation/Gait Ambulation/Gait assistance: Min assist Ambulation Distance (Feet): 180 Feet Assistive device: None Gait Pattern/deviations: Step-through pattern;Decreased  stride length;Drifts right/left;Staggering right;Narrow base of support Gait velocity: decreased Gait velocity interpretation: Below normal speed for age/gender General Gait Details: patient with noted instability during ambulation, multiple LOb to the right when ambulating, very slow cadence despite cues to increase speed. recommend use of RW for stability  Stairs            Wheelchair Mobility    Modified Rankin (Stroke Patients Only) Modified Rankin (Stroke Patients Only) Pre-Morbid Rankin Score: No symptoms Modified Rankin: Moderately severe disability     Balance Overall balance assessment: Needs assistance   Sitting balance-Leahy Scale: Good       Standing balance-Leahy Scale: Fair                               Pertinent Vitals/Pain Pain Assessment: No/denies pain    Home Living Family/patient expects to be discharged to:: Private residence Living Arrangements: Alone Available Help at Discharge: Family;Friend(s);Available PRN/intermittently Type of Home: House Home Access: Stairs to enter Entrance Stairs-Rails: None Entrance Stairs-Number of Steps: 2 Home Layout: One level Home Equipment: None      Prior Function Level of Independence: Independent               Hand Dominance   Dominant Hand: Right    Extremity/Trunk Assessment   Upper Extremity Assessment Upper Extremity Assessment: LUE deficits/detail    Lower Extremity Assessment Lower Extremity Assessment: LLE deficits/detail LLE Deficits / Details: modest deficits in strength noted in comparison to RLE. Gross motions 4/5 upon assessment LLE Sensation: history of peripheral neuropathy       Communication  Cognition Arousal/Alertness: Awake/alert Behavior During Therapy: Flat affect Overall Cognitive Status: Within Functional Limits for tasks assessed                      General Comments      Exercises     Assessment/Plan    PT Assessment Patient  needs continued PT services  PT Problem List Decreased strength;Decreased activity tolerance;Decreased balance;Decreased mobility;Decreased knowledge of use of DME;Decreased safety awareness          PT Treatment Interventions Stair training;Gait training;DME instruction;Functional mobility training;Therapeutic activities;Therapeutic exercise;Balance training;Cognitive remediation;Neuromuscular re-education;Patient/family education    PT Goals (Current goals can be found in the Care Plan section)  Acute Rehab PT Goals Patient Stated Goal: to go home PT Goal Formulation: With patient Time For Goal Achievement: 09/19/16 Potential to Achieve Goals: Good    Frequency Min 4X/week   Barriers to discharge Decreased caregiver support      Co-evaluation               End of Session Equipment Utilized During Treatment: Gait belt Activity Tolerance: Patient tolerated treatment well Patient left: in chair;with call bell/phone within reach;with chair alarm set Nurse Communication: Mobility status         Time: [id number] PT Time Calculation (min) (ACUTE ONLY): 20 min   Charges:   PT Evaluation $PT Eval Moderate Complexity: 1 Procedure     PT G Codes:        Duncan Dull 25-Sep-2016, 9:51 AM  Alben Deeds, PT DPT  [phone]

## 2016-09-05 NOTE — Progress Notes (Signed)
PT Cancellation Note  Patient Details Name: Stephen [name] MRN: [id number] DOB: 12/01/1940   Cancelled Treatment:    Reason Eval/Treat Not Completed: Patient not medically ready, Active bedrest orders at this time   Duncan Dull 09/05/2016, 8:07 AM Alben Deeds, PT DPT  [phone]

## 2016-09-06 ENCOUNTER — Encounter (HOSPITAL_COMMUNITY): Payer: Self-pay | Admitting: Oncology

## 2016-09-06 ENCOUNTER — Inpatient Hospital Stay (HOSPITAL_COMMUNITY): Payer: PPO

## 2016-09-06 ENCOUNTER — Encounter (HOSPITAL_COMMUNITY): Payer: Self-pay | Admitting: General Practice

## 2016-09-06 DIAGNOSIS — I639 Cerebral infarction, unspecified: Secondary | ICD-10-CM

## 2016-09-06 DIAGNOSIS — I63331 Cerebral infarction due to thrombosis of right posterior cerebral artery: Secondary | ICD-10-CM

## 2016-09-06 LAB — CBC
HEMATOCRIT: 32 % — AB (ref 39.0–52.0)
Hemoglobin: 10.1 g/dL — ABNORMAL LOW (ref 13.0–17.0)
MCH: 29.3 pg (ref 26.0–34.0)
MCHC: 31.6 g/dL (ref 30.0–36.0)
MCV: 92.8 fL (ref 78.0–100.0)
PLATELETS: 167 10*3/uL (ref 150–400)
RBC: 3.45 MIL/uL — ABNORMAL LOW (ref 4.22–5.81)
RDW: 15.5 % (ref 11.5–15.5)
WBC: 5.7 10*3/uL (ref 4.0–10.5)

## 2016-09-06 LAB — GLUCOSE, CAPILLARY
GLUCOSE-CAPILLARY: 163 mg/dL — AB (ref 65–99)
GLUCOSE-CAPILLARY: 172 mg/dL — AB (ref 65–99)
Glucose-Capillary: 126 mg/dL — ABNORMAL HIGH (ref 65–99)
Glucose-Capillary: 183 mg/dL — ABNORMAL HIGH (ref 65–99)

## 2016-09-06 LAB — BASIC METABOLIC PANEL
Anion gap: 8 (ref 5–15)
BUN: 36 mg/dL — AB (ref 6–20)
CALCIUM: 9 mg/dL (ref 8.9–10.3)
CO2: 22 mmol/L (ref 22–32)
CREATININE: 1.99 mg/dL — AB (ref 0.61–1.24)
Chloride: 109 mmol/L (ref 101–111)
GFR calc Af Amer: 36 mL/min — ABNORMAL LOW (ref >60)
GFR, EST NON AFRICAN AMERICAN: 31 mL/min — AB (ref >60)
GLUCOSE: 173 mg/dL — AB (ref 65–99)
Potassium: 4.8 mmol/L (ref 3.5–5.1)
Sodium: 139 mmol/L (ref 135–145)

## 2016-09-06 LAB — HEMOGLOBIN A1C
Hgb A1c MFr Bld: 5.4 % (ref 4.8–5.6)
Mean Plasma Glucose: 108 mg/dL

## 2016-09-06 MED ORDER — SODIUM CHLORIDE 0.9 % IV SOLN
INTRAVENOUS | Status: DC
  Administered 2016-09-06: 1000 mL via INTRAVENOUS
  Administered 2016-09-07: 17:00:00 via INTRAVENOUS

## 2016-09-06 NOTE — Progress Notes (Signed)
VASCULAR LAB PRELIMINARY  PRELIMINARY  PRELIMINARY  PRELIMINARY  Carotid duplex completed.    Preliminary report:  Technically difficult and limited study secondary to shadowing and tortuosity at surgical site, especially on the right.  It is hard to delineate the ICA from the ECA at the site of the CEA.  There is significant ECA stenosis, and I cannot rule out a possible 60-79% right ICA stenosis versus ECA stenosis. If this is a true ICA signal, it is indicative of an increase since study done 02/15/16 (also a technically limited and difficult study). There is 1-39% left ICA stenosis.  Bilateral vertebral artery flow is antegrade.   Stephen [name], RVT 09/06/2016, 4:52 PM

## 2016-09-06 NOTE — Progress Notes (Signed)
Occupational Therapy Evaluation Patient Details Name: Stephen [name] MRN: [id number] DOB: Oct 02, 1940 Today's Date: 09/06/2016    History of Present Illness 75 y.o. male who presented to [facility] on 09/04/16 with left sided face, arm and leg weakness. His speech was also slurred. He was last known normal at 5:00 PM. The patient stated at EMS arrival that he takes ASA, but was difficult to understand. He was administered IV tPA at OSH and then transported to [facility] for CTA and possible endovascular procedure.  He has a PMHx of carotid artery occlusion, stroke in 2001, bilateral endarterectomies, cardiac disease, HTN, HLD and PVD.   Clinical Impression   PTA, pt lived alone and was independent with ADL and mobility. Pt presents with funcitonal decline as listed below and will benefit from [facility] after DC. Recommend initial 24/7 S after DC. Pt states his daughters will be able to stay with him after DC as needed. Pt will need a RW  For safe DC. Recommend use of shower chair. Pt educated on reducing risk of falls and given handout. All further OT needs to be addressed by [place].     Follow Up Recommendations  Home health OT;Supervision/Assistance - 24 hour (initially)    Equipment Recommendations  Other (comment) (RW)  Shower chair ([employer])   Recommendations for Other Services       Precautions / Restrictions Precautions Precautions: Fall Restrictions Weight Bearing Restrictions: No      Mobility Bed Mobility Overal bed mobility: Modified Independent                Transfers Overall transfer level: Needs assistance Equipment used: Rolling walker (2 wheeled) Transfers: Sit to/from Stand Sit to Stand: Supervision              Balance Overall balance assessment: Needs assistance   Sitting balance-Leahy Scale: Good       Standing balance-Leahy Scale: Fair                               ADL Overall ADL's : Needs assistance/impaired      Grooming: Set up;Standing   Upper Body Bathing: Set up;Standing   Lower Body Bathing: Supervison/ safety;Set up;Sit to/from stand   Upper Body Dressing : Set up   Lower Body Dressing: Set up;Supervision/safety;Sit to/from stand   Toilet Transfer: Supervision/safety;Ambulation   Toileting- Clothing Manipulation and Hygiene: Modified independent       Functional mobility during ADLs: Supervision/safety;Rolling walker General ADL Comments: Pt states he feels unsteady when walking. Pt likes to use a shopping cart because it makes him feel safer. Pt ambulated with RW and liked the "feel of the RW"  REcommend pt use shower chair Educated on reducing risk of falls.     Vision Vision Assessment?: No apparent visual deficits Wears glasses for reaching Appears at baseline ? Low vision deficits at baseline  Perception     Praxis Praxis Praxis tested?: Within functional limits    Pertinent Vitals/Pain Pain Assessment: No/denies pain     Hand Dominance Right   Extremity/Trunk Assessment Upper Extremity Assessment Upper Extremity Assessment: Overall WFL for tasks assessed   Lower Extremity Assessment Lower Extremity Assessment: Defer to PT evaluation (states he has a history of leg weakness from his back)   Cervical / Trunk Assessment Cervical / Trunk Assessment: Normal   Communication Communication Communication: HOH   Cognition Arousal/Alertness: Awake/alert Behavior During Therapy: Flat affect Overall Cognitive Status:  Within Functional Limits for tasks assessed                     General Comments       Exercises       Shoulder Instructions      Home Living Family/patient expects to be discharged to:: Private residence Living Arrangements: Alone Available Help at Discharge: Family;Friend(s);Available PRN/intermittently Type of Home: House Home Access: Stairs to enter [employer] of Steps: 2 Entrance Stairs-Rails: None Home Layout: One  level     Bathroom Shower/Tub: Tub/shower unit Shower/tub characteristics: Architectural technologist: Standard Bathroom Accessibility: Yes How Accessible: Accessible via walker Home Equipment: Bedside commode          Prior Functioning/Environment Level of Independence: Independent        Comments: drives        OT Problem List: Decreased strength;Decreased activity tolerance;Impaired balance (sitting and/or standing);Decreased knowledge of use of DME or AE   OT Treatment/Interventions:      OT Goals(Current goals can be found in the care plan section) Acute Rehab OT Goals Patient Stated Goal: to go home OT Goal Formulation: With patient Time For Goal Achievement: Sep 27, 2016 Potential to Achieve Goals: Good  OT Frequency:     Barriers to D/C:            Co-evaluation              End of Session Equipment Utilized During Treatment: Gait belt;Rolling walker Nurse Communication: Mobility status;Other (comment) (DC needs)  Activity Tolerance: Patient tolerated treatment well Patient left: in chair;with call bell/phone within reach   Time: 0840-0908 OT Time Calculation (min): 28 min Charges:  OT General Charges $OT Visit: 1 Procedure OT Evaluation $OT Eval Moderate Complexity: 1 Procedure OT Treatments $Self Care/Home Management : 8-22 mins G-Codes:    Eryka Dolinger,HILLARY Sep 27, 2016, 9:16 AM  Maurie Boettcher, OT/L  [phone] September 27, 2016

## 2016-09-06 NOTE — Progress Notes (Signed)
STROKE TEAM PROGRESS NOTE   HISTORY OF PRESENT ILLNESS (per record) Stephen [name] is an 75 y.o. male who presented to [facility] on Friday with left sided face, arm and leg weakness. His speech was also slurred. He was last known normal at 5:00 PM. The patient stated at EMS arrival that he takes ASA, but was difficult to understand. He was administered IV tPA at OSH and then transported to [facility] for CTA and possible endovascular procedure.   He has a PMHx of carotid artery occlusion, stroke in 2001, bilateral endarterectomies, cardiac disease, HTN, HLD and PVD.    LSN: 5:00 PM tPA Given: Yes, at outside hospital Endovascular procedure undertaken: No. CTA is negative for LVO   SUBJECTIVE (INTERVAL HISTORY) few family members present.   He is feeling much better now. He has had previous strokes with residual deficits. MRI scan of the brain shows right posterior cerebral artery and left cerebellar infarcts.  OBJECTIVE Temp:  [98 F (36.7 C)-98.5 F (36.9 C)] 98.3 F (36.8 C) (12/17 0542) Pulse Rate:  [33-85] 69 (12/17 0542) Cardiac Rhythm: Normal sinus rhythm (12/16 2328) Resp:  [16-22] 18 (12/17 0542) BP: (92-160)/(47-131) 149/88 (12/17 0542) SpO2:  [95 %-99 %] 99 % (12/17 0542) Weight:  [110.2 kg (242 lb 14.4 oz)] 110.2 kg (242 lb 14.4 oz) (12/16 2331)  CBC:   Recent Labs Lab 09/04/16 1755 09/04/16 1803 09/06/16 0247  WBC 7.1  --  5.7  NEUTROABS 4.7  --   --   HGB 10.6* 10.5* 10.1*  HCT 33.1* 31.0* 32.0*  MCV 93.5  --  92.8  PLT 167  --  [id number]    Basic Metabolic Panel:   Recent Labs Lab 09/04/16 1755 09/04/16 1803 09/06/16 0247  NA 134* 138 139  K 5.0 5.3* 4.8  CL 105 107 109  CO2 21*  --  22  GLUCOSE 188* 179* 173*  BUN 52* 57* 36*  CREATININE 2.29* 2.30* 1.99*  CALCIUM 8.5*  --  9.0    Lipid Panel:     Component Value Date/Time   CHOL 121 09/05/2016 0252   TRIG 254 (H) 09/05/2016 0252   HDL 37 (L) 09/05/2016 0252   CHOLHDL 3.3 09/05/2016  0252   VLDL 51 (H) 09/05/2016 0252   LDLCALC 33 09/05/2016 0252   HgbA1c:  Lab Results  Component Value Date   HGBA1C (H) 07/05/2008    7.2 (NOTE)   The ADA recommends the following therapeutic goal for glycemic   control related to Hgb A1C measurement:   Goal of Therapy:   < 7.0% Hgb A1C   Reference: American Diabetes Association: Clinical Practice   Recommendations 2008, Diabetes Care,  2008, 31:(Suppl 1).   Urine Drug Screen:     Component Value Date/Time   LABOPIA NONE DETECTED 09/05/2016 0530   COCAINSCRNUR NONE DETECTED 09/05/2016 0530   LABBENZ NONE DETECTED 09/05/2016 0530   AMPHETMU NONE DETECTED 09/05/2016 0530   THCU NONE DETECTED 09/05/2016 0530   LABBARB NONE DETECTED 09/05/2016 0530      IMAGING  Ct Angio Head W Or Wo Contrast 09/04/2016 1. No intracranial arterial occlusion or high-grade stenosis.  2. Right frontal lobe encephalomalacia secondary to remote infarct.  3. No acute hemorrhage.    Ct Head Code Stroke W/o Cm  09/04/2016 1. No acute intracranial abnormality. Unchanged appearance of old right frontal infarct.  2. ASPECTS is 10.   MRI / MRA Head / Brain  09/05/2016 Pending     PHYSICAL  EXAM  HEENT-  Normocephalic/atraumatic.Neck supple soft right carotid bruit heared. Lungs - No gross wheezing. Respirations unlabored.  Extremities - Warm and well perfused  Neurologic Examination:  Awake, Alert. Dysarthric. Speech is fluent with intact comprehension and naming.  CN: PERRL. Visual fields intact. Exotropia (chronic). Decreased ocular motility on right. Left ocular motility normal. Facial sensation intact to temperature bilaterally. Left lower facial droop. Hearing intact to questions and commands. IX/X: Hypophonic with pharyngeal dysarthria. XI: Left sided weakness.  Motor: LUE 1/5, LLE 3/5, RUE 5/5, RLE [email or web address] left hand weakness and diminished fine finger  movements. Sensory: Intact to fine touch bilaterally without extinction.   Reflexes: Hypoactive achilles bilaterally. Normoactive patellar, brachioradialis and biceps reflexes.  Cerebellar: No ataxia with FNF on right. Unable to perform on left.  Gait: Unable to assess.     ASSESSMENT/PLAN Stephen [name] is a 75 y.o. male with history of carotid artery occlusion, stroke in 2001, bilateral endarterectomies, cardiac disease, DM, CAD, B 12 deficiency, HTN, HLD and PVD.  presenting with left hemiparesis, slurred speech and possible dizziness.  He received IV TPA at [facility] 09/04/2016 at [place].  Stroke:  Right posterior cerebral artery and left cerebellar infarcts possibly embolic from an unknown source.  Resultant  Mild dysarthria and left sided weakness MRI - Acute infarction in the right PCA territory affecting the posteromedial temporal lobe, occipital lobe and right thalamus. Few  small infarctions in the left cerebellum. MRA - Intracranial MR angiography does not show any anterior circulation stenosis or occlusion. Posterior circulation branch vessels show  flow, but there are stenoses within both PCA territories  Carotid Doppler - pending 2D Echo - Left ventricle: The cavity size was normal. Wall thickness was   normal. Systolic function was normal. The estimated ejection   fraction was in the range of 60% to 65%. Wall motion was normal;    there were no regional wall motion abnormalities  LDL - 33  HgbA1c - 5.4VTE prophylaxis - SCDs Diet heart healthy/carb modified Room service appropriate? Yes; Fluid consistency: Thin  aspirin 81 mg daily prior to admission, now on No antithrombotic -  .  Patient counseled to be compliant with his antithrombotic medications  Ongoing aggressive stroke risk factor management  Therapy recommendations: Home health physical therapy recommended with 24 hour per day supervision.  Disposition: Pending  Hypertension  Blood pressure for the most part stable except for an occasional low blood  pressure.  Permissive hypertension (OK if < 220/120) but gradually normalize in 5-7 days  Long-term BP goal normotensive  Hyperlipidemia  Home meds:  Pravachol 40 mg daily resumed in hospital  LDL 33, goal < 70  Continue statin.  Diabetes  HgbA1c pending, goal < 7.0  Uncontrolled  Other Stroke Risk Factors  Advanced age  The patient quit smoking 43 years ago.  Obesity, Body mass index is 32.94 kg/m., recommend weight loss, diet and exercise as appropriate   Hx stroke/TIA  Coronary artery disease   Other Active Problems  Acute on chronic renal failure - BUN 52 / creatinine 2.29 / GFR 26 (on Lasix 20 mg twice daily)  Anemia - hemoglobin 10.5 ; hematocrit 31  Hypokalemia - 5.0   PLAN Continueaspirin 325 mg daily  Plan TEE and  loop monitor for [facility] day # 2   I have personally examined this patient, reviewed notes, independently viewed imaging studies, participated in medical decision making and plan of care.ROS completed by me personally and pertinent positives fully  documented  I have made any additions or clarifications directly to the above note . Long discussion with patient and family at the bedside and answered questions. Mobilize out of bed. Therapy consults. Plan TEE and loop recorder in the next few days. Greater than 50% and during this 25 minute visit was spent on counseling and coordination of care about stroke and TIA risk and prevention and treatment Antony Contras, MD Medical Director [place] Pager: [phone] 09/06/2016 2:18 PM     To contact Stroke Continuity provider, please refer to [email or web address] After hours, contact General Neurology

## 2016-09-06 NOTE — Progress Notes (Signed)
Physical Therapy Treatment Patient Details Name: Stephen [name] MRN: [id number] DOB: 1941/05/10 Today's Date: 09/06/2016    History of Present Illness 75 y.o. male who presented to [facility] on 09/04/16 with left sided face, arm and leg weakness. His speech was also slurred. He was last known normal at 5:00 PM. The patient stated at EMS arrival that he takes ASA, but was difficult to understand. He was administered IV tPA at OSH and then transported to [facility] for CTA and possible endovascular procedure.  He has a PMHx of carotid artery occlusion, stroke in 2001, bilateral endarterectomies, cardiac disease, HTN, HLD and PVD.    PT Comments    Patient continues to demo balance deficits increasing risk for falls. Pt will need RW for safe ambulation. Family present end of session. Continue to progress as tolerated with anticipated d/c home with HHPT.   Follow Up Recommendations  Home health PT;Supervision/Assistance - 24 hour     Equipment Recommendations  Rolling walker with 5" wheels    Recommendations for Other Services       Precautions / Restrictions Precautions Precautions: Fall    Mobility  Bed Mobility Overal bed mobility: Modified Independent                Transfers Overall transfer level: Needs assistance Equipment used: Rolling walker (2 wheeled) Transfers: Sit to/from Stand Sit to Stand: Supervision         General transfer comment: supervision for safety  Ambulation/Gait Ambulation/Gait assistance: Min assist Ambulation Distance (Feet): 200 Feet Assistive device: None;Rolling walker (2 wheeled) Gait Pattern/deviations: Step-through pattern;Decreased stance time - left;Drifts right/left     General Gait Details: pt unsteady without AD and with LOB to R side requiring assist to recover; pt more steady with RW; cues for safe use of AD   Stairs Stairs: Yes   Stair Management: One rail Right;Step to pattern;Forwards Number of Stairs:  10 General stair comments: cues for sequencing and safety; pt a little impulsive  Wheelchair Mobility    Modified Rankin (Stroke Patients Only)       Balance Overall balance assessment: Needs assistance   Sitting balance-Leahy Scale: Good       Standing balance-Leahy Scale: Fair               High level balance activites: Head turns;Direction changes High Level Balance Comments: pt demonstrated gait disturbances and unsteadiness with horizontal and verical head turns and direction changes without AD    Cognition Arousal/Alertness: Awake/alert Behavior During Therapy: Flat affect Overall Cognitive Status: Within Functional Limits for tasks assessed                      Exercises      General Comments General comments (skin integrity, edema, etc.): family present end of session      Pertinent Vitals/Pain Pain Assessment: No/denies pain    Home Living                      Prior Function            PT Goals (current goals can now be found in the care plan section) Acute Rehab PT Goals Patient Stated Goal: to go home    Frequency    Min 4X/week      PT Plan Current plan remains appropriate    Co-evaluation             End of Session Equipment Utilized During Treatment: Gait  belt Activity Tolerance: Patient tolerated treatment well Patient left: in bed;with call bell/phone within reach;with family/visitor present     Time: [id number] PT Time Calculation (min) (ACUTE ONLY): 19 min  Charges:  $Gait Training: 8-22 mins                    G Codes:      [place] April, PTA Pager: [phone]   09/06/2016, 2:37 PM

## 2016-09-07 ENCOUNTER — Inpatient Hospital Stay (HOSPITAL_COMMUNITY): Payer: PPO

## 2016-09-07 ENCOUNTER — Encounter (HOSPITAL_COMMUNITY)
Admission: EM | Disposition: A | Discharge status: Home / Self Care | Payer: Self-pay | Source: Home / Self Care | Attending: Neurology

## 2016-09-07 DIAGNOSIS — I480 Paroxysmal atrial fibrillation: Secondary | ICD-10-CM

## 2016-09-07 DIAGNOSIS — I779 Disorder of arteries and arterioles, unspecified: Secondary | ICD-10-CM

## 2016-09-07 DIAGNOSIS — E785 Hyperlipidemia, unspecified: Secondary | ICD-10-CM

## 2016-09-07 DIAGNOSIS — I10 Essential (primary) hypertension: Secondary | ICD-10-CM

## 2016-09-07 DIAGNOSIS — I6340 Cerebral infarction due to embolism of unspecified cerebral artery: Secondary | ICD-10-CM

## 2016-09-07 LAB — GLUCOSE, CAPILLARY
GLUCOSE-CAPILLARY: 142 mg/dL — AB (ref 65–99)
GLUCOSE-CAPILLARY: 237 mg/dL — AB (ref 65–99)
GLUCOSE-CAPILLARY: 129 mg/dL — AB (ref 65–99)
Glucose-Capillary: 153 mg/dL — ABNORMAL HIGH (ref 65–99)

## 2016-09-07 LAB — BASIC METABOLIC PANEL
Anion gap: 11 (ref 5–15)
BUN: 33 mg/dL — AB (ref 6–20)
CHLORIDE: 106 mmol/L (ref 101–111)
CO2: 21 mmol/L — AB (ref 22–32)
Calcium: 8.9 mg/dL (ref 8.9–10.3)
Creatinine, Ser: 1.56 mg/dL — ABNORMAL HIGH (ref 0.61–1.24)
GFR calc non Af Amer: 42 mL/min — ABNORMAL LOW (ref >60)
GFR, EST AFRICAN AMERICAN: 48 mL/min — AB (ref >60)
Glucose, Bld: 154 mg/dL — ABNORMAL HIGH (ref 65–99)
Potassium: 4.3 mmol/L (ref 3.5–5.1)
Sodium: 138 mmol/L (ref 135–145)

## 2016-09-07 LAB — CBC
HEMATOCRIT: 31.4 % — AB (ref 39.0–52.0)
HEMOGLOBIN: 10.1 g/dL — AB (ref 13.0–17.0)
MCH: 29.4 pg (ref 26.0–34.0)
MCHC: 32.2 g/dL (ref 30.0–36.0)
MCV: 91.5 fL (ref 78.0–100.0)
Platelets: 172 10*3/uL (ref 150–400)
RBC: 3.43 MIL/uL — ABNORMAL LOW (ref 4.22–5.81)
RDW: 15.2 % (ref 11.5–15.5)
WBC: 6.5 10*3/uL (ref 4.0–10.5)

## 2016-09-07 SURGERY — ECHOCARDIOGRAM, TRANSESOPHAGEAL
Anesthesia: Moderate Sedation

## 2016-09-07 MED ORDER — PANTOPRAZOLE SODIUM 40 MG PO TBEC
40.0000 mg | DELAYED_RELEASE_TABLET | Freq: Every day | ORAL | Status: DC
  Administered 2016-09-07: 40 mg via ORAL
  Filled 2016-09-07: qty 1

## 2016-09-07 MED ORDER — APIXABAN 5 MG PO TABS
5.0000 mg | ORAL_TABLET | Freq: Two times a day (BID) | ORAL | Status: DC
  Administered 2016-09-07 – 2016-09-08 (×2): 5 mg via ORAL
  Filled 2016-09-07 (×2): qty 1

## 2016-09-07 MED ORDER — ASPIRIN EC 81 MG PO TBEC
81.0000 mg | DELAYED_RELEASE_TABLET | Freq: Every day | ORAL | Status: DC
  Administered 2016-09-08: 81 mg via ORAL
  Filled 2016-09-07: qty 1

## 2016-09-07 NOTE — Consult Note (Signed)
[facility] CM Primary Care Navigator  09/07/2016  [name] May 14, 1941 [id number]  Met with patient, daughter Stephen [name]) and grandson Mcmann) at the bedside to identify possible discharge needs. Patient reports that he felt weak like passing out which led to this admission.  Patient endorses Dr. [name] with [name] MD office as the primary care provider.    Patient shared using [facility] in [place] to obtain medications without any problem right now since he had been assisted by [place] social services to obtain his insulin..   Patient manages his own medications at home straight out of the containers per daughter's report.   Patient is able to drive prior to admission. Daughter or fiance Opal Sidles) will be providing transportation to his doctors' appointments after discharge.  Patient lives alone and able to independently take care of himself before this admission. Daughter will serve the primary caregiver at home as stated.   Discharge plan is home with home health services.  Patient and daughter voiced understanding to call primary care provider's office when he gets home, for a post discharge follow-up appointment within a week or sooner if needs arise. Patient letter provided as a reminder. Patient mentioned having a prior appointment to see PCP next week and was encouraged to attend as scheduled.   Daughter reports that patient's diabetes had been well managed at home with recent A1c of 5.4.  Both denies further needs or concerns at this time. [name] care management contact information provided if future needs arise.  For additional questions please contact:  Edwena Felty A. Seba Madole, BSN, RN-BC [facility] PRIMARY CARE Navigator Cell: [phone]

## 2016-09-07 NOTE — Progress Notes (Signed)
Physical Therapy Treatment Patient Details Name: Stephen [name] MRN: [id number] DOB: 11/19/1940 Today's Date: 09/07/2016    History of Present Illness 75 y.o. male who presented to [facility] on 09/04/16 with left sided face, arm and leg weakness. His speech was also slurred. He was last known normal at 5:00 PM. The patient stated at EMS arrival that he takes ASA, but was difficult to understand. He was administered IV tPA at OSH and then transported to [facility] for CTA and possible endovascular procedure.  He has a PMHx of carotid artery occlusion, stroke in 2001, bilateral endarterectomies, cardiac disease, HTN, HLD and PVD.    PT Comments    Overall good use of RW, and pt reports he will consistently use it at home; Will alos benefit from using 3in1 to help with rise from low commode; notified Case Mgr of recommended equipment; OK for dc home from PT standpoint   Follow Up Recommendations  Home health PT;Supervision/Assistance - 24 hour     Equipment Recommendations  Rolling walker with 5" wheels;3in1 (PT)    Recommendations for Other Services       Precautions / Restrictions Precautions Precautions: Fall    Mobility  Bed Mobility                  Transfers Overall transfer level: Needs assistance Equipment used: Rolling walker (2 wheeled) Transfers: Sit to/from Stand Sit to Stand: Supervision         General transfer comment: supervision for safety  Ambulation/Gait Ambulation/Gait assistance: Min guard (without physical contact) Ambulation Distance (Feet): 200 Feet Assistive device: Rolling walker (2 wheeled) Gait Pattern/deviations: Step-through pattern;Decreased stance time - left;Drifts right/left Gait velocity: decreased   General Gait Details: performed entire walk with RW and pt reports confidence in his balance and ability to manage walking at home   Stairs            Wheelchair Mobility    Modified Rankin (Stroke Patients  Only) Modified Rankin (Stroke Patients Only) Pre-Morbid Rankin Score: No symptoms Modified Rankin: Moderate disability     Balance Overall balance assessment: Needs assistance           Standing balance-Leahy Scale: Fair                      Cognition Arousal/Alertness: Awake/alert Behavior During Therapy: WFL for tasks assessed/performed Overall Cognitive Status: Within Functional Limits for tasks assessed                      Exercises      General Comments        Pertinent Vitals/Pain Pain Assessment: No/denies pain    Home Living                      Prior Function            PT Goals (current goals can now be found in the care plan section) Acute Rehab PT Goals Patient Stated Goal: to go home PT Goal Formulation: With patient Time For Goal Achievement: 09/19/16 Potential to Achieve Goals: Good Progress towards PT goals: Progressing toward goals    Frequency    Min 4X/week      PT Plan Current plan remains appropriate    Co-evaluation             End of Session Equipment Utilized During Treatment: Gait belt Activity Tolerance: Patient tolerated treatment well Patient left: in bed;with call  bell/phone within reach;with family/visitor present (sitting EOB)     Time: 1340-1400 PT Time Calculation (min) (ACUTE ONLY): 20 min  Charges:  $Gait Training: 8-22 mins                    G Codes:      Colletta [place] September 19, 2016, 2:51 PM  Roney Marion, [place] Pager [phone] Office [phone]

## 2016-09-07 NOTE — Progress Notes (Signed)
Pt to have TEE in AM will be NPO after Midnight 12/18.

## 2016-09-07 NOTE — Progress Notes (Signed)
ANTICOAGULATION CONSULT NOTE - Initial Consult  Pharmacy Consult for Apixaban Indication: atrial fibrillation  No Known Allergies  Patient Measurements: Height: 6' (182.9 cm) Weight: 242 lb 14.4 oz (110.2 kg) IBW/kg (Calculated) : 77.6  Vital Signs: Temp: 97.8 F (36.6 C) (12/18 1408) Temp Source: Oral (12/18 1408) BP: 136/78 (12/18 1408) Pulse Rate: 100 (12/18 1408)  Labs:  Recent Labs  09/04/16 1755 09/04/16 1803 09/06/16 0247 09/07/16 0627  HGB 10.6* 10.5* 10.1* 10.1*  HCT 33.1* 31.0* 32.0* 31.4*  PLT 167  --  167 172  APTT 36  --   --   --   LABPROT 14.7  --   --   --   INR 1.14  --   --   --   CREATININE 2.29* 2.30* 1.99* 1.56*    Estimated Creatinine Clearance: 52.4 mL/min (by C-G formula based on SCr of 1.56 mg/dL (H)).   Medical History: Past Medical History:  Diagnosis Date  . Anemia 06/01/2012   H&H of 10/30 in 8/13 with normal MCV   . B12 deficiency 08/17/2016  . Carotid artery occlusion   . Celiac disease   . Cerebrovascular disease    H/o CVA In 2001 followed by a right carotid endarterectomy; left carotid endarterectomy in 2008  . Coronary atherosclerosis of native coronary artery    PTCA of LAD in 1994; total obstruction of the RCA treated medically; subsequent stress nuclear study with inferior ischemia  . DDD (degenerative disc disease), lumbar    Lumbar surgery in 1984  . Diabetes mellitus type II   . Essential hypertension, benign   . Hyperlipidemia   . Peripheral vascular disease ([place])   . Stroke [facility]) 2001   Right brain    Medications:  Scheduled:  . [START ON 09/08/2016] aspirin EC  81 mg Oral Daily  . furosemide  20 mg Oral BID  . insulin aspart  0-15 Units Subcutaneous TID WC  . insulin aspart  0-5 Units Subcutaneous QHS  . labetalol  20 mg Intravenous Once  . pantoprazole  40 mg Oral QHS  . pravastatin  40 mg Oral Daily  . vitamin B-12  2,000 mcg Oral Daily    Assessment: 75 yo M who presented to [facility] 12/15 with L sided  weakness.  Pt was given IV tPA and transferred to [facility].  Now to start Apixaban for afib/stroke prophylaxis.  Goal of Therapy:  therapeutic anticoagulation Monitor platelets by anticoagulation protocol: Yes   Plan:  Apixaban 5mg  BID   [employer], Pharm.D., BCPS Clinical Pharmacist Pager [phone] 09/07/2016 4:52 PM

## 2016-09-07 NOTE — Progress Notes (Signed)
STROKE TEAM PROGRESS NOTE   SUBJECTIVE (INTERVAL HISTORY) Son and daughter are at present. He was scheduled for TEE today but on reviewing EKG on 09/04/16 showed afib. Therefore, TEE cancelled. Cre getting better, and will continue IVF and check CTA neck tomorrow.  OBJECTIVE Temp:  [97.7 F (36.5 C)-98.9 F (37.2 C)] 98.1 F (36.7 C) (12/18 0909) Pulse Rate:  [54-89] 84 (12/18 0909) Cardiac Rhythm: Normal sinus rhythm (12/18 0700) Resp:  [18-20] 20 (12/18 0909) BP: (118-188)/(47-69) 159/69 (12/18 0909) SpO2:  [95 %-100 %] 95 % (12/18 0909)  CBC:   Recent Labs Lab 09/04/16 1755  09/06/16 0247 09/07/16 0627  WBC 7.1  --  5.7 6.5  NEUTROABS 4.7  --   --   --   HGB 10.6*  < > 10.1* 10.1*  HCT 33.1*  < > 32.0* 31.4*  MCV 93.5  --  92.8 91.5  PLT 167  --  167 172  < > = values in this interval not displayed.  Basic Metabolic Panel:   Recent Labs Lab 09/06/16 0247 09/07/16 0627  NA 139 138  K 4.8 4.3  CL 109 106  CO2 22 21*  GLUCOSE 173* 154*  BUN 36* 33*  CREATININE 1.99* 1.56*  CALCIUM 9.0 8.9    Lipid Panel:     Component Value Date/Time   CHOL 121 09/05/2016 0252   TRIG 254 (H) 09/05/2016 0252   HDL 37 (L) 09/05/2016 0252   CHOLHDL 3.3 09/05/2016 0252   VLDL 51 (H) 09/05/2016 0252   LDLCALC 33 09/05/2016 0252   HgbA1c:  Lab Results  Component Value Date   HGBA1C 5.4 09/05/2016   Urine Drug Screen:     Component Value Date/Time   LABOPIA NONE DETECTED 09/05/2016 0530   COCAINSCRNUR NONE DETECTED 09/05/2016 0530   LABBENZ NONE DETECTED 09/05/2016 0530   AMPHETMU NONE DETECTED 09/05/2016 0530   THCU NONE DETECTED 09/05/2016 0530   LABBARB NONE DETECTED 09/05/2016 0530      IMAGING I have personally reviewed the radiological images below and agree with the radiology interpretations.  Ct Angio Head W Or Wo Contrast 09/04/2016 1. No intracranial arterial occlusion or high-grade stenosis.  2. Right frontal lobe encephalomalacia secondary to remote  infarct.  3. No acute hemorrhage.   Ct Head Code Stroke W/o Cm  09/04/2016 1. No acute intracranial abnormality. Unchanged appearance of old right frontal infarct.  2. ASPECTS is 10.   MRI / MRA Head / Brain  09/05/2016 Acute infarction in the right PCA territory affecting the posteromedial temporal lobe, occipital lobe and right thalamus.  Few small infarctions in the left cerebellum.  No mass effect or hemorrhage. Remote right MCA territory infarction. MRA does not show any anterior circulation stenosis or occlusion.  Posterior circulation branch vessels show flow, but there are stenoses within both PCA territories.  Flow in the right PCA shows partial recanalization compared to the CTA study of 08/2014.  CUS 09/06/2016 Preliminary report:  Technically difficult and limited study secondary to shadowing and tortuosity at surgical site, especially on the right.  It is hard to delineate the ICA from the ECA at the site of the CEA.  There is significant ECA stenosis, and I cannot rule out a possible 60-79% right ICA stenosis versus ECA stenosis. If this is a true ICA signal, it is indicative of an increase since study done 02/15/16 (also a technically limited and difficult study). There is 1-39% left ICA stenosis.  Bilateral vertebral artery flow is antegrade.  Transthoracic echocardiogram 09/05/2016 Study Conclusions - Left ventricle: The cavity size was normal. Wall thickness was   normal. Systolic function was normal. The estimated ejection   fraction was in the range of 60% to 65%. Wall motion was normal;   there were no regional wall motion abnormalities. Features are   consistent with a pseudonormal left ventricular filling pattern,   with concomitant abnormal relaxation and increased filling   pressure (grade 2 diastolic dysfunction). - Aortic valve: Mildly calcified annulus. Moderately thickened,   moderately calcified leaflets. - Mitral valve: There was mild regurgitation. -  Pulmonary arteries: Systolic pressure was moderately increased.   PA peak pressure: 51 mm Hg (S).  CTA neck pending   PHYSICAL EXAM  Temp:  [97.7 F (36.5 C)-98.9 F (37.2 C)] 97.8 F (36.6 C) (12/18 1408) Pulse Rate:  [54-100] 100 (12/18 1408) Resp:  [18-20] 20 (12/18 0909) BP: (118-188)/(47-78) 136/78 (12/18 1408) SpO2:  [95 %-100 %] 98 % (12/18 1408)  General - Well nourished, well developed, in no apparent distress.  Ophthalmologic - Fundi not visualized due to eye movement.  Cardiovascular - irregularly irregular heart rate and rhythm.  Mental Status -  Level of arousal and orientation to time, place, and person were intact. Language including expression, naming, repetition, comprehension was assessed and found intact.  Cranial Nerves II - XII - II - Visual field intact OU. III, IV, VI - Extraocular movements intact. V - Facial sensation intact bilaterally. VII - Facial movement intact bilaterally. VIII - Hearing & vestibular intact bilaterally. X - Palate elevates symmetrically. XI - Chin turning & shoulder shrug intact bilaterally. XII - Tongue protrusion intact.  Motor Strength - The patient's strength was normal in all extremities and pronator drift was absent.  Bulk was normal and fasciculations were absent.   Motor Tone - Muscle tone was assessed at the neck and appendages and was normal.  Reflexes - The patient's reflexes were 1+ in all extremities and he had no pathological reflexes.  Sensory - Light touch, temperature/pinprick were assessed and were symmetrical.    Coordination - The patient had normal movements in the hands with no ataxia or dysmetria.  Tremor was absent.  Gait and Station - deferred   ASSESSMENT/PLAN [name] is a 75 y.o. male with history of carotid artery occlusion, stroke in 2001, bilateral endarterectomies, cardiac disease, DM, CAD, B 12 deficiency, HTN, HLD and PVD.  presenting with left hemiparesis, slurred speech and  possible dizziness.  He received IV TPA at [facility] 09/04/2016 at [place].  Stroke:  Right PCA, MCA/PCA and left cerebellar infarcts, likely embolic from newly diagnosed afib.  Resultant deficit resolved   MRI - right PCA, MCA/PCA, left SCA infarcts.  MRA - stenoses within both PCAs  Carotid Doppler - difficulty visualization of ICAs   CTA neck - pending  2D Echo - EF 60-65%. No cardiac source of emboli identified.  LDL - 33  HgbA1c - 5.4  VTE prophylaxis - SCDs Diet NPO time specified Except for: Sips with Meds  aspirin 81 mg daily prior to admission, now on eliquis and ASA 81mg  for stroke and vascular prevention  Patient counseled to be compliant with his antithrombotic medications  Ongoing aggressive stroke risk factor management  Therapy recommendations: Home health PT with 24 hour per day supervision.  Disposition: Pending  Newly diagnosed afib  EKG 09/04/16 confirmed afib  Likely the cause of embolic strokes this time  Will start eliquis for stroke prevention  Carotid disease s/p bilateral CEAs  Serial CUS not able to visualize bilateral ICAs well  Will do CTA neck once Cre normalized  Continue ASA 81mg  on discharge.   Hypertension  BP stable  Permissive hypertension (OK if < 220/120) but gradually normalize in 5-7 days  Long-term BP goal normotensive  Hyperlipidemia  Home meds:  Pravachol 40 mg daily resumed in hospital  LDL 33, goal < 70  Continue statin on discharge  Diabetes  HgbA1c 5.4, goal < 7.0  Controlled  SSI  CBG monitoring  Other Stroke Risk Factors  Advanced age  The patient quit smoking 43 years ago.  Obesity, Body mass index is 32.94 kg/m., recommend weight loss, diet and exercise as appropriate   Hx stroke/TIA  Coronary artery disease  Other Active Problems  Acute on chronic renal failure - creatinine 2.29 -> 1.99 -> 1.56  BMP monitoring   Rosalin Hawking, MD PhD Stroke Neurology 09/07/2016 4:46  PM   To contact Stroke Continuity provider, please refer to [email or web address] After hours, contact General Neurology

## 2016-09-08 ENCOUNTER — Inpatient Hospital Stay (HOSPITAL_COMMUNITY): Payer: PPO

## 2016-09-08 ENCOUNTER — Other Ambulatory Visit (HOSPITAL_COMMUNITY): Payer: PPO

## 2016-09-08 DIAGNOSIS — Z7901 Long term (current) use of anticoagulants: Secondary | ICD-10-CM

## 2016-09-08 DIAGNOSIS — E1159 Type 2 diabetes mellitus with other circulatory complications: Secondary | ICD-10-CM

## 2016-09-08 DIAGNOSIS — M10062 Idiopathic gout, left knee: Secondary | ICD-10-CM

## 2016-09-08 DIAGNOSIS — Z794 Long term (current) use of insulin: Secondary | ICD-10-CM

## 2016-09-08 DIAGNOSIS — I6523 Occlusion and stenosis of bilateral carotid arteries: Secondary | ICD-10-CM

## 2016-09-08 LAB — BASIC METABOLIC PANEL
Anion gap: 10 (ref 5–15)
BUN: 36 mg/dL — AB (ref 6–20)
CO2: 25 mmol/L (ref 22–32)
CREATININE: 1.58 mg/dL — AB (ref 0.61–1.24)
Calcium: 9.1 mg/dL (ref 8.9–10.3)
Chloride: 103 mmol/L (ref 101–111)
GFR, EST AFRICAN AMERICAN: 48 mL/min — AB (ref >60)
GFR, EST NON AFRICAN AMERICAN: 41 mL/min — AB (ref >60)
Glucose, Bld: 141 mg/dL — ABNORMAL HIGH (ref 65–99)
POTASSIUM: 4.6 mmol/L (ref 3.5–5.1)
SODIUM: 138 mmol/L (ref 135–145)

## 2016-09-08 LAB — CBC
HCT: 31.4 % — ABNORMAL LOW (ref 39.0–52.0)
Hemoglobin: 10.2 g/dL — ABNORMAL LOW (ref 13.0–17.0)
MCH: 29.7 pg (ref 26.0–34.0)
MCHC: 32.5 g/dL (ref 30.0–36.0)
MCV: 91.3 fL (ref 78.0–100.0)
PLATELETS: 169 10*3/uL (ref 150–400)
RBC: 3.44 MIL/uL — AB (ref 4.22–5.81)
RDW: 15 % (ref 11.5–15.5)
WBC: 7.2 10*3/uL (ref 4.0–10.5)

## 2016-09-08 LAB — VAS US CAROTID
LCCADDIAS: -13 cm/s
LCCADSYS: -85 cm/s
LEFT ECA DIAS: -12 cm/s
LEFT VERTEBRAL DIAS: 11 cm/s
LICADDIAS: -22 cm/s
LICADSYS: -83 cm/s
LICAPDIAS: -13 cm/s
LICAPSYS: -76 cm/s
Left CCA prox dias: 17 cm/s
Left CCA prox sys: 111 cm/s
RIGHT ECA DIAS: -33 cm/s
RIGHT VERTEBRAL DIAS: -15 cm/s
Right CCA prox dias: -15 cm/s
Right CCA prox sys: -81 cm/s

## 2016-09-08 LAB — GLUCOSE, CAPILLARY
GLUCOSE-CAPILLARY: 140 mg/dL — AB (ref 65–99)
Glucose-Capillary: 149 mg/dL — ABNORMAL HIGH (ref 65–99)
Glucose-Capillary: 173 mg/dL — ABNORMAL HIGH (ref 65–99)

## 2016-09-08 MED ORDER — CYANOCOBALAMIN 1000 MCG PO TABS: 1000.0000 ug | ORAL_TABLET | Freq: Every day | ORAL | Status: DC

## 2016-09-08 MED ORDER — COLCHICINE 0.6 MG PO TABS
0.6000 mg | ORAL_TABLET | Freq: Once | ORAL | Status: AC
  Administered 2016-09-08: 0.6 mg via ORAL

## 2016-09-08 MED ORDER — COLCHICINE 0.6 MG PO TABS: 0.6000 mg | ORAL_TABLET | Freq: Once | ORAL | Status: DC

## 2016-09-08 MED ORDER — CYANOCOBALAMIN 2000 MCG PO TABS: 1000.0000 ug | ORAL_TABLET | Freq: Every day | ORAL | Status: DC

## 2016-09-08 MED ORDER — FUROSEMIDE 40 MG PO TABS: 40.0000 mg | ORAL_TABLET | Freq: Every day | ORAL | 2 refills | Status: DC

## 2016-09-08 MED ORDER — IOPAMIDOL (ISOVUE-370) INJECTION 76%
INTRAVENOUS | Status: AC
  Administered 2016-09-08: 50 mL
  Filled 2016-09-08: qty 50

## 2016-09-08 MED ORDER — COLCHICINE 0.6 MG PO TABS: 1.2000 mg | ORAL_TABLET | Freq: Once | ORAL | Status: DC

## 2016-09-08 MED ORDER — COLCHICINE 0.6 MG PO TABS
0.6000 mg | ORAL_TABLET | Freq: Once | ORAL | Status: AC
  Administered 2016-09-08: 0.6 mg via ORAL
  Filled 2016-09-08: qty 1

## 2016-09-08 MED ORDER — APIXABAN 5 MG PO TABS
5.0000 mg | ORAL_TABLET | Freq: Two times a day (BID) | ORAL | 5 refills | Status: DC
Start: 2016-09-08 — End: 2017-04-26

## 2016-09-08 NOTE — Progress Notes (Signed)
PT Cancellation Note  Patient Details Name: Stephen [name] MRN: [id number] DOB: 05-07-1941   Cancelled Treatment:    Reason Eval/Treat Not Completed: Patient at procedure or test/unavailable   Rexanne Mano 09/08/2016, 4:22 PM Pager [phone]

## 2016-09-08 NOTE — Discharge Summary (Signed)
Stroke Discharge Summary  Patient ID: Stephen [name]   MRN: [id number]      DOB: 1941/08/08  Date of Admission: 09/04/2016 Date of Discharge: 09/08/2016  Attending Physician:  Rosalin Hawking, MD, Stroke MD Consulting Physician(s):    rehabilitation medicine , cardiology EP Patient's PCP:  [name], MD  DISCHARGE DIAGNOSIS:  Active Problems:   Embolic stroke s/p tPA   Afib, new diagnosis   Carotid stenosis s/p bilateral CEAs   HTN   HLD   DM   Obesity   CAD   CKD stage III      BMI: Body mass index is 32.94 kg/m.  Past Medical History:  Diagnosis Date  . Anemia 06/01/2012   H&H of 10/30 in 8/13 with normal MCV   . B12 deficiency 08/17/2016  . Carotid artery occlusion   . Celiac disease   . Cerebrovascular disease    H/o CVA In 2001 followed by a right carotid endarterectomy; left carotid endarterectomy in 2008  . Coronary atherosclerosis of native coronary artery    PTCA of LAD in 1994; total obstruction of the RCA treated medically; subsequent stress nuclear study with inferior ischemia  . DDD (degenerative disc disease), lumbar    Lumbar surgery in 1984  . Diabetes mellitus type II   . Essential hypertension, benign   . Hyperlipidemia   . Peripheral vascular disease ([place])   . Stroke [facility]) 2001   Right brain   Past Surgical History:  Procedure Laterality Date  . APPENDECTOMY  1970  . CAROTID ENDARTERECTOMY  2008   Left   . CAROTID ENDARTERECTOMY  06/15/2000   Right  . COLONOSCOPY N/A 10/10/2015   Procedure: COLONOSCOPY;  Surgeon: Rogene Houston, MD;  Location: [facility];  Service: Endoscopy;  Laterality: N/A;  12:25 - moved to 11:15 - Ann to notify  . [place] SURGERY  2000, 2001   L5 HNP required 2 surgical procedures    Allergies as of 09/08/2016   No Known Allergies     Medication List    TAKE these medications   acetaminophen 500 MG tablet Commonly known as:  TYLENOL Take 500 mg by mouth every 6 (six) hours as needed.   amLODipine 5 MG  tablet Commonly known as:  NORVASC Take 5 mg by mouth daily.   apixaban 5 MG Tabs tablet Commonly known as:  ELIQUIS Take 1 tablet (5 mg total) by mouth 2 (two) times daily.   aspirin 81 MG tablet Take 81 mg by mouth daily.   atenolol 50 MG tablet Commonly known as:  TENORMIN Take 50 mg by mouth daily.   cyanocobalamin 1000 MCG tablet Take 1 tablet (1,000 mcg total) by mouth daily. Start taking on:  09/09/2016   doxazosin 2 MG tablet Commonly known as:  CARDURA Take 2 mg by mouth daily.   furosemide 40 MG tablet Commonly known as:  LASIX Take 1 tablet (40 mg total) by mouth daily. TAKE 1 AND 1/2 TABLET (60 MG) IN THE AM AND TAKE 1 TABLET (40 MG) IN THE EVENING What changed:  how much to take  how to take this  when to take this   insulin aspart 100 UNIT/ML injection Commonly known as:  novoLOG Inject 25 Units into the skin 2 (two) times daily.   insulin detemir 100 UNIT/ML injection Commonly known as:  LEVEMIR Inject 65 Units into the skin at bedtime.   losartan 25 MG tablet Commonly known as:  COZAAR Take 25  mg by mouth daily.   pravastatin 40 MG tablet Commonly known as:  PRAVACHOL Take 40 mg by mouth daily.   PREVACID PO Take by mouth as needed.            Durable Medical Equipment        Start     Ordered   09/07/16 1502  For home use only DME Walker rolling  Once    Question:  Patient needs a walker to treat with the following condition  Answer:  Stroke ([place])   09/07/16 1501   09/07/16 1501  For home use only DME 3 n 1  Once     09/07/16 1501      LABORATORY STUDIES CBC    Component Value Date/Time   WBC 7.2 09/08/2016 0600   RBC 3.44 (L) 09/08/2016 0600   HGB 10.2 (L) 09/08/2016 0600   HCT 31.4 (L) 09/08/2016 0600   PLT 169 09/08/2016 0600   MCV 91.3 09/08/2016 0600   MCH 29.7 09/08/2016 0600   MCHC 32.5 09/08/2016 0600   RDW 15.0 09/08/2016 0600   LYMPHSABS 1.6 09/04/2016 1755   MONOABS 0.7 09/04/2016 1755   EOSABS 0.2  09/04/2016 1755   BASOSABS 0.0 09/04/2016 1755   CMP    Component Value Date/Time   NA 138 09/08/2016 0600   K 4.6 09/08/2016 0600   CL 103 09/08/2016 0600   CO2 25 09/08/2016 0600   GLUCOSE 141 (H) 09/08/2016 0600   BUN 36 (H) 09/08/2016 0600   CREATININE 1.58 (H) 09/08/2016 0600   CALCIUM 9.1 09/08/2016 0600   PROT 7.7 09/04/2016 1755   ALBUMIN 3.5 09/04/2016 1755   AST 17 09/04/2016 1755   ALT 16 (L) 09/04/2016 1755   ALKPHOS 61 09/04/2016 1755   BILITOT 0.3 09/04/2016 1755   GFRNONAA 41 (L) 09/08/2016 0600   GFRAA 48 (L) 09/08/2016 0600   COAGS Lab Results  Component Value Date   INR 1.14 09/04/2016   INR 1.0 07/05/2008   INR 1.0 07/18/2007   Lipid Panel    Component Value Date/Time   CHOL 121 09/05/2016 0252   TRIG 254 (H) 09/05/2016 0252   HDL 37 (L) 09/05/2016 0252   CHOLHDL 3.3 09/05/2016 0252   VLDL 51 (H) 09/05/2016 0252   LDLCALC 33 09/05/2016 0252   HgbA1C  Lab Results  Component Value Date   HGBA1C 5.4 09/05/2016   Cardiac Panel (last 3 results) No results for input(s): CKTOTAL, CKMB, TROPONINI, RELINDX in the last 72 hours. Urinalysis    Component Value Date/Time   COLORURINE STRAW (A) 09/05/2016 0530   APPEARANCEUR CLEAR 09/05/2016 0530   LABSPEC 1.017 09/05/2016 0530   PHURINE 5.0 09/05/2016 0530   GLUCOSEU NEGATIVE 09/05/2016 0530   HGBUR NEGATIVE 09/05/2016 0530   BILIRUBINUR NEGATIVE 09/05/2016 0530   KETONESUR NEGATIVE 09/05/2016 0530   PROTEINUR NEGATIVE 09/05/2016 0530   UROBILINOGEN 0.2 07/05/2008 0922   NITRITE NEGATIVE 09/05/2016 0530   LEUKOCYTESUR NEGATIVE 09/05/2016 0530   Urine Drug Screen     Component Value Date/Time   LABOPIA NONE DETECTED 09/05/2016 0530   COCAINSCRNUR NONE DETECTED 09/05/2016 0530   LABBENZ NONE DETECTED 09/05/2016 0530   AMPHETMU NONE DETECTED 09/05/2016 0530   THCU NONE DETECTED 09/05/2016 0530   LABBARB NONE DETECTED 09/05/2016 0530    Alcohol Level    Component Value Date/Time   ETH <5  09/04/2016 1755     SIGNIFICANT DIAGNOSTIC STUDIES I have personally reviewed the radiological images below and agree  with the radiology interpretations.  Ct Angio Head W Or Wo Contrast 09/04/2016 1. No intracranial arterial occlusion or high-grade stenosis.  2. Right frontal lobe encephalomalacia secondary to remote infarct.  3. No acute hemorrhage.   Ct Head Code Stroke W/o Cm  09/04/2016 1. No acute intracranial abnormality. Unchanged appearance of old right frontal infarct.  2. ASPECTS is 10.   MRI / MRA Head / Brain  09/05/2016 Acute infarction in the right PCA territory affecting the posteromedial temporal lobe, occipital lobe and right thalamus.  Few small infarctions in the left cerebellum.  No mass effect or hemorrhage. Remote right MCA territory infarction. MRA does not show any anterior circulation stenosis or occlusion.  Posterior circulation branch vessels show flow, but there are stenoses within both PCA territories.  Flow in the right PCA shows partial recanalization compared to the CTA study of 08/2014.  CUS 09/06/2016 Preliminary report: Technically difficult and limited study secondary to shadowing and tortuosity at surgical site, especially on the right. It is hard to delineate the ICA from the ECA at the site of the CEA. There is significant ECA stenosis, and I cannot rule out a possible 60-79% right ICA stenosis versus ECA stenosis. If this is a true ICA signal, it is indicative of anincrease since study done 02/15/16 (also a technically limited and difficult study). There is 1-39% left ICA stenosis. Bilateral vertebral artery flow is antegrade.   Transthoracic echocardiogram 09/05/2016 Study Conclusions - Left ventricle: The cavity size was normal. Wall thickness was normal. Systolic function was normal. The estimated ejection fraction was in the range of 60% to 65%. Wall motion was normal; there were no regional wall motion abnormalities.  Features are consistent with a pseudonormal left ventricular filling pattern, with concomitant abnormal relaxation and increased filling pressure (grade 2 diastolic dysfunction). - Aortic valve: Mildly calcified annulus. Moderately thickened, moderately calcified leaflets. - Mitral valve: There was mild regurgitation. - Pulmonary arteries: Systolic pressure was moderately increased. PA peak pressure: 51 mm Hg (S).  CTA neck 1. Right greater than left carotid bifurcation and proximal ICA atherosclerosis without hemodynamically significant cervical carotid stenosis. Partially visible extensive ICA siphon calcified plaque is stable from the recent head CTA. 2. Moderate to severe stenosis at the left vertebral artery origin related to calcified plaque. Bilateral V4 segment calcified plaque but no other significant bilateral vertebral artery stenosis. Both PICA origins are patent. 3. Bilateral nonspecific upper lobe pulmonary nodules ranging from 6-9 mm. Recommend follow-up Chest CT (noncontrast study should suffice). 4. Partially visible right PCA territory infarct with expected evolution. No intracranial mass effect is evident.    HISTORY OF PRESENT ILLNESS Stephen [name] is an 75 y.o. male who presented to [facility] on Friday with left sided face, arm and leg weakness. His speech was also slurred. He was last known normal at 5:00 PM. The patient stated at EMS arrival that he takes ASA, but was difficult to understand. He was administered IV tPA at OSH and then transported to [facility] for CTA and possible endovascular procedure.  He has a PMHx of carotid artery occlusion, stroke in 2001, bilateral endarterectomies, cardiac disease, HTN, HLD and PVD.  LSN: 5:00 PM tPA Given: Yes, at outside hospital Endovascular procedure undertaken: No. CTA is negative for [facility]   HOSPITAL COURSE Stephen [name] is a 75 y.o. male with history of carotid artery occlusion, stroke in 2001,  bilateral endarterectomies, cardiac disease, DM, CAD, B 12 deficiency, HTN, HLD and PVD.  presenting  with left hemiparesis, slurred speech and possible dizziness.  He received IV TPA at [facility] 09/04/2016 at 1831. 24 hr after tPA, he was doing well and transfer to floor. Tele monitoring showed afib and confirmed by EKG. His TEE and loop cancelled and he was put on eliquis for stroke prevention. ASA decreased to 81mg  for CAD and PAD. He was then discharged in good condition with [facility] therapy.   Stroke:  Right PCA, MCA/PCA and left cerebellar infarcts, likely embolic from newly diagnosed afib.  Resultant deficit resolved   MRI - right PCA, MCA/PCA, left SCA infarcts.  MRA - stenoses within both PCAs  Carotid Doppler - difficulty visualization of ICAs   CTA neck - R>L ICA bifurcation athero but no significant stensis  2D Echo - EF 60-65%. No cardiac source of emboli identified.  LDL - 33  HgbA1c - 5.4  VTE prophylaxis - SCDs  Diet NPO time specified Except for: Sips with Meds  aspirin 81 mg daily prior to admission, now on eliquis and ASA 81mg  for stroke and cardiac prevention  Patient counseled to be compliant with his antithrombotic medications  Ongoing aggressive stroke risk factor management  Therapy recommendations: Home health PT with 24 hour per day supervision.  Disposition: Pending  Newly diagnosed afib  EKG 09/04/16 confirmed afib  Likely the cause of embolic strokes this time  Will start eliquis for stroke prevention  Carotid disease s/p bilateral CEAs  Serial CUS not able to visualize bilateral ICAs well  CTA neck - R>L ICA bifurcation athero but no significant stensis  Continue ASA 81mg  on discharge.   Hypertension  BP stable              Permissive hypertension (OK if < 220/120) but gradually normalize in 5-7 days              Long-term BP goal normotensive  Hyperlipidemia  Home meds:  Pravachol 40 mg daily resumed in  hospital  LDL 33, goal < 70  Continue statin on discharge  Diabetes  HgbA1c 5.4, goal < 7.0  Controlled  SSI  CBG monitoring  On insulin  Possible gout left knee  Hx of gout  Left knee pain with palpation  Colchicine treatment given  Follow up with PCP  Other Stroke Risk Factors  Advanced age  The patient quit smoking 43 years ago.  Obesity, Body mass index is 32.94 kg/m., recommend weight loss, diet and exercise as appropriate   Hx stroke/TIA  Coronary artery disease  Other Active Problems  Acute on chronic renal failure - creatinine 2.29 -> 1.99 -> 1.56  BMP monitoring  DISCHARGE EXAM Blood pressure (!) 186/51, pulse 100, temperature 99.8 F (37.7 C), temperature source Oral, resp. rate 16, height 6' (1.829 m), weight 242 lb 14.4 oz (110.2 kg), SpO2 97 %.  General - Well nourished, well developed, in no apparent distress.  Ophthalmologic - Fundi not visualized due to eye movement.  Musculoskeletal - left knee pain on palpation  Cardiovascular - irregularly irregular heart rate and rhythm.  Mental Status -  Level of arousal and orientation to time, place, and person were intact. Language including expression, naming, repetition, comprehension was assessed and found intact.  Cranial Nerves II - XII - II - Visual field intact OU. III, IV, VI - Extraocular movements intact. V - Facial sensation intact bilaterally. VII - Facial movement intact bilaterally. VIII - Hearing & vestibular intact bilaterally. X - Palate elevates symmetrically. XI - Chin turning &  shoulder shrug intact bilaterally. XII - Tongue protrusion intact.  Motor Strength - The patient's strength was normal in all extremities and pronator drift was absent.  Bulk was normal and fasciculations were absent.   Motor Tone - Muscle tone was assessed at the neck and appendages and was normal.  Reflexes - The patient's reflexes were 1+ in all extremities and he had no  pathological reflexes.  Sensory - Light touch, temperature/pinprick were assessed and were symmetrical.    Coordination - The patient had normal movements in the hands with no ataxia or dysmetria.  Tremor was absent.  Gait and Station - deferred  Discharge Diet   Diet heart healthy/carb modified Room service appropriate? Yes; Fluid consistency: Thin Diet - low sodium heart healthy liquids  DISCHARGE PLAN  Disposition:  Home with home health  aspirin 81 mg daily and Eliquis (apixaban) daily for secondary stroke prevention.  Ongoing risk factor control by Primary Care Physician at time of discharge  Follow-up FAGAN,ROY, MD in 2 weeks.  Follow-up with carolyn Hassell Done at [facility] in 6 weeks, office to schedule an appointment.  40 minutes were spent preparing discharge.  Rosalin Hawking, MD PhD Stroke Neurology 09/08/2016 4:57 PM

## 2016-09-08 NOTE — Progress Notes (Signed)
Patient is discharged from room 5C17 at this time. Alert and in stable condition. IV site d/c;d as well as tele. Instructions read to patient and daughter with understanding verbalized. Left unit via wheelchair with all belongings at side.

## 2016-09-08 NOTE — Care Management Note (Signed)
Case Management Note  Patient Details  Name: SUMNER MAILLE MRN: [id number] Date of Birth: June 18, 1941  Subjective/Objective:                    Action/Plan: Patient d/cing on Eliquis. CM provided the patients daughter a 30 day free card for the Eliquis and informed her to ask the pharmacy about the cost of the medication after the 30 days. Patient currently receives medication assistance from a clinic in [place]. CM encouraged the daughter to have the patient reach out to them if the medication is too expensive or call the number for Eliquis or inform his PCP to see if he could assist. Pt already received his DME and his daughter has taken it home.   Expected Discharge Date:                  Expected Discharge Plan:     In-House Referral:     Discharge planning Services     Post Acute Care Choice:    Choice offered to:     DME Arranged:    DME Agency:     HH Arranged:    HH Agency:     Status of Service:  In process, will continue to follow  If discussed at Long Length of Stay Meetings, dates discussed:    Additional Comments:  Pollie Friar, RN 09/08/2016, 4:16 PM

## 2016-09-08 NOTE — Discharge Instructions (Signed)

## 2016-09-08 NOTE — Care Management Important Message (Signed)
Important Message  Patient Details  Name: Stephen [name] MRN: [id number] Date of Birth: 02-11-1941   Medicare Important Message Given:  Yes    Naima Veldhuizen [address]ine [address] 09/08/2016, 11:32 AM

## 2016-09-09 DIAGNOSIS — R269 Unspecified abnormalities of gait and mobility: Secondary | ICD-10-CM | POA: Diagnosis not present

## 2016-09-09 DIAGNOSIS — I639 Cerebral infarction, unspecified: Secondary | ICD-10-CM | POA: Diagnosis not present

## 2016-09-10 ENCOUNTER — Encounter (HOSPITAL_BASED_OUTPATIENT_CLINIC_OR_DEPARTMENT_OTHER): Payer: PPO | Admitting: Oncology

## 2016-09-10 ENCOUNTER — Encounter (HOSPITAL_BASED_OUTPATIENT_CLINIC_OR_DEPARTMENT_OTHER): Payer: PPO

## 2016-09-10 ENCOUNTER — Encounter (HOSPITAL_COMMUNITY): Payer: PPO

## 2016-09-10 ENCOUNTER — Encounter (HOSPITAL_COMMUNITY): Payer: Self-pay | Admitting: Oncology

## 2016-09-10 ENCOUNTER — Ambulatory Visit (HOSPITAL_COMMUNITY): Payer: PPO

## 2016-09-10 VITALS — BP 103/56 | HR 69 | Resp 18 | Ht 72.0 in | Wt 234.0 lb

## 2016-09-10 DIAGNOSIS — I2510 Atherosclerotic heart disease of native coronary artery without angina pectoris: Secondary | ICD-10-CM | POA: Diagnosis not present

## 2016-09-10 DIAGNOSIS — E611 Iron deficiency: Secondary | ICD-10-CM | POA: Diagnosis not present

## 2016-09-10 DIAGNOSIS — M109 Gout, unspecified: Secondary | ICD-10-CM | POA: Diagnosis not present

## 2016-09-10 DIAGNOSIS — I129 Hypertensive chronic kidney disease with stage 1 through stage 4 chronic kidney disease, or unspecified chronic kidney disease: Secondary | ICD-10-CM

## 2016-09-10 DIAGNOSIS — D649 Anemia, unspecified: Secondary | ICD-10-CM | POA: Diagnosis not present

## 2016-09-10 DIAGNOSIS — E669 Obesity, unspecified: Secondary | ICD-10-CM | POA: Diagnosis not present

## 2016-09-10 DIAGNOSIS — E1122 Type 2 diabetes mellitus with diabetic chronic kidney disease: Secondary | ICD-10-CM

## 2016-09-10 DIAGNOSIS — E538 Deficiency of other specified B group vitamins: Secondary | ICD-10-CM

## 2016-09-10 DIAGNOSIS — Z794 Long term (current) use of insulin: Secondary | ICD-10-CM | POA: Diagnosis not present

## 2016-09-10 DIAGNOSIS — Z7982 Long term (current) use of aspirin: Secondary | ICD-10-CM | POA: Diagnosis not present

## 2016-09-10 DIAGNOSIS — I69292 Facial weakness following other nontraumatic intracranial hemorrhage: Secondary | ICD-10-CM | POA: Diagnosis not present

## 2016-09-10 DIAGNOSIS — I4891 Unspecified atrial fibrillation: Secondary | ICD-10-CM | POA: Diagnosis not present

## 2016-09-10 DIAGNOSIS — N183 Chronic kidney disease, stage 3 (moderate): Secondary | ICD-10-CM | POA: Diagnosis not present

## 2016-09-10 DIAGNOSIS — I350 Nonrheumatic aortic (valve) stenosis: Secondary | ICD-10-CM | POA: Diagnosis not present

## 2016-09-10 DIAGNOSIS — D631 Anemia in chronic kidney disease: Secondary | ICD-10-CM

## 2016-09-10 DIAGNOSIS — E785 Hyperlipidemia, unspecified: Secondary | ICD-10-CM | POA: Diagnosis not present

## 2016-09-10 DIAGNOSIS — I69354 Hemiplegia and hemiparesis following cerebral infarction affecting left non-dominant side: Secondary | ICD-10-CM | POA: Diagnosis not present

## 2016-09-10 LAB — CBC WITH DIFFERENTIAL/PLATELET
BASOS ABS: 0.1 10*3/uL (ref 0.0–0.1)
Basophils Relative: 1 %
EOS ABS: 0.2 10*3/uL (ref 0.0–0.7)
EOS PCT: 3 %
HCT: 32.3 % — ABNORMAL LOW (ref 39.0–52.0)
Hemoglobin: 10.2 g/dL — ABNORMAL LOW (ref 13.0–17.0)
LYMPHS PCT: 22 %
Lymphs Abs: 1.7 10*3/uL (ref 0.7–4.0)
MCH: 29.7 pg (ref 26.0–34.0)
MCHC: 31.6 g/dL (ref 30.0–36.0)
MCV: 94.2 fL (ref 78.0–100.0)
MONO ABS: 0.7 10*3/uL (ref 0.1–1.0)
Monocytes Relative: 9 %
Neutro Abs: 5.2 10*3/uL (ref 1.7–7.7)
Neutrophils Relative %: 67 %
PLATELETS: 178 10*3/uL (ref 150–400)
RBC: 3.43 MIL/uL — ABNORMAL LOW (ref 4.22–5.81)
RDW: 15.4 % (ref 11.5–15.5)
WBC: 7.8 10*3/uL (ref 4.0–10.5)

## 2016-09-10 MED ORDER — CYANOCOBALAMIN 1000 MCG/ML IJ SOLN
1000.0000 ug | Freq: Once | INTRAMUSCULAR | Status: AC
  Administered 2016-09-10: 1000 ug via INTRAMUSCULAR

## 2016-09-10 MED ORDER — CYANOCOBALAMIN 1000 MCG/ML IJ SOLN
INTRAMUSCULAR | Status: AC
  Filled 2016-09-10: qty 1

## 2016-09-10 MED ORDER — HYDROCODONE-ACETAMINOPHEN 5-325 MG PO TABS: 1.0000 | ORAL_TABLET | Freq: Four times a day (QID) | ORAL | 0 refills | Status: DC | PRN

## 2016-09-10 MED ORDER — COLCHICINE 0.6 MG PO CAPS: ORAL_CAPSULE | ORAL | 0 refills | Status: DC

## 2016-09-10 MED ORDER — B-12 5000 MCG SL SUBL: 5000.0000 ug | SUBLINGUAL_TABLET | Freq: Every day | SUBLINGUAL | 11 refills | Status: DC

## 2016-09-10 MED ORDER — METHYLPREDNISOLONE SODIUM SUCC 125 MG IJ SOLR
125.0000 mg | Freq: Once | INTRAMUSCULAR | Status: AC
  Administered 2016-09-10: 125 mg via INTRAMUSCULAR
  Filled 2016-09-10: qty 2

## 2016-09-10 NOTE — Progress Notes (Signed)
Stephen Noble, MD [address] [place] [id number]  Anemia in stage 3 chronic kidney disease  B12 deficiency  Acute gout of left knee, unspecified cause - Plan: HYDROcodone-acetaminophen (NORCO/VICODIN) 5-325 MG tablet, Colchicine 0.6 MG CAPS, methylPREDNISolone sodium succinate (SOLU-MEDROL) 125 mg/2 mL injection 125 mg  CURRENT THERAPY: Planning to start ESA therapy.  INTERVAL HISTORY: Stephen [name] 75 y.o. male returns for followup of normocyctic, normochromic anemia in the setting of Stage III disease. Complicated by a right PCA acute infarction requiring tPA on 09/04/2016 at 1831 hours.  Cause of embolic stroke from newly diagnosed A-fib.  Now anticoagulated with Eliquis and ASA for stroke and cardiac prevention.  Chart is reviewed.  Recent hospitalization for stroke is noted.   He reports left knee gout.  It began while in the hospital and on his last day, he was given 1.2 mg of colchicine.  He was not discharged with medication for ongoing gout management/treatment.  PT is seeing him today at home.  He notes ongoing left sided weakness.  He denies any issues with swallowing.  He reports ambulation at home, but this is limited given his ongoing left knee gout.  Review of Systems  Constitutional: Negative.  Negative for chills and fever.  HENT: Negative.   Eyes: Negative.   Respiratory: Negative.  Negative for cough.   Cardiovascular: Negative.  Negative for chest pain.  Gastrointestinal: Negative.  Negative for blood in stool and melena.  Genitourinary: Negative.   Musculoskeletal: Positive for joint pain (left knee).  Skin: Negative.   Neurological: Positive for focal weakness (left sided).  Endo/Heme/Allergies: Negative.   Psychiatric/Behavioral: Negative.     Past Medical History:  Diagnosis Date  . Anemia 06/01/2012   H&H of 10/30 in 8/13 with normal MCV   . B12 deficiency 08/17/2016  . Carotid artery occlusion   . Celiac disease   .  Cerebrovascular disease    H/o CVA In 2001 followed by a right carotid endarterectomy; left carotid endarterectomy in 2008  . Coronary atherosclerosis of native coronary artery    PTCA of LAD in 1994; total obstruction of the RCA treated medically; subsequent stress nuclear study with inferior ischemia  . DDD (degenerative disc disease), lumbar    Lumbar surgery in 1984  . Diabetes mellitus type II   . Essential hypertension, benign   . Hyperlipidemia   . Peripheral vascular disease ([place])   . Stroke [facility]) 2001   Right brain    Past Surgical History:  Procedure Laterality Date  . APPENDECTOMY  1970  . CAROTID ENDARTERECTOMY  2008   Left   . CAROTID ENDARTERECTOMY  06/15/2000   Right  . COLONOSCOPY N/A 10/10/2015   Procedure: COLONOSCOPY;  Surgeon: Rogene Houston, MD;  Location: [facility];  Service: Endoscopy;  Laterality: N/A;  12:25 - moved to 11:15 - Ann to notify  . [place] SURGERY  2000, 2001   L5 HNP required 2 surgical procedures    Family History  Problem Relation Age of Onset  . Heart disease Mother     Before age 45  . Diabetes Mother   . Heart attack Mother   . Diabetes Sister   . Heart disease Brother   . Diabetes Brother   . Diabetes Daughter   . Diabetes Son   . Hyperlipidemia Son     Social History   Social History  . Marital status: Widowed    Spouse name: N/A  .  Number of children: N/A  . Years of education: N/A   Social History Main Topics  . Smoking status: Former Smoker    Packs/day: 3.00    Years: 10.00    Types: Cigarettes    Quit date: 09/21/1972  . Smokeless tobacco: Never Used  . Alcohol use No  . Drug use: No  . Sexual activity: Yes    Partners: Male    Birth control/ protection: Post-menopausal   Other Topics Concern  . None   Social History Narrative  . None     PHYSICAL EXAMINATION  ECOG PERFORMANCE STATUS: 2 - Symptomatic, <50% confined to bed  Vitals:   09/10/16 1033  BP: (!) 103/56  Pulse: 69  Resp: 18     GENERAL:alert, well nourished, well developed, cooperative, smiling and in wheelchair, accompanied by daughter, Shirlean Mylar. SKIN: skin color, texture, turgor are normal, no rashes or significant lesions HEAD: Normocephalic, No masses, lesions, tenderness or abnormalities EYES: normal, EOMI, Conjunctiva are pink and non-injected EARS: External ears normal OROPHARYNX:lips, buccal mucosa, and tongue normal and mucous membranes are moist  NECK: supple, trachea midline LYMPH:  not examined BREAST:not examined LUNGS: clear to auscultation  HEART: irregularly irregular ABDOMEN:abdomen soft and normal bowel sounds BACK: Back symmetric, no curvature. EXTREMITIES:less then 2 second capillary refill, no skin discoloration, positive findings:  Left knee with mild erythema, mild edema, hot to the touch, very tender to mild palpation.  NEURO: alert & oriented x 3 with fluent speech, in wheelchair, left sided weakness   LABORATORY DATA: CBC    Component Value Date/Time   WBC 7.8 09/10/2016 0910   RBC 3.43 (L) 09/10/2016 0910   HGB 10.2 (L) 09/10/2016 0910   HCT 32.3 (L) 09/10/2016 0910   PLT 178 09/10/2016 0910   MCV 94.2 09/10/2016 0910   MCH 29.7 09/10/2016 0910   MCHC 31.6 09/10/2016 0910   RDW 15.4 09/10/2016 0910   LYMPHSABS 1.7 09/10/2016 0910   MONOABS 0.7 09/10/2016 0910   EOSABS 0.2 09/10/2016 0910   BASOSABS 0.1 09/10/2016 0910      Chemistry      Component Value Date/Time   NA 138 09/08/2016 0600   K 4.6 09/08/2016 0600   CL 103 09/08/2016 0600   CO2 25 09/08/2016 0600   BUN 36 (H) 09/08/2016 0600   CREATININE 1.58 (H) 09/08/2016 0600      Component Value Date/Time   CALCIUM 9.1 09/08/2016 0600   ALKPHOS 61 09/04/2016 1755   AST 17 09/04/2016 1755   ALT 16 (L) 09/04/2016 1755   BILITOT 0.3 09/04/2016 1755        PENDING LABS:   RADIOGRAPHIC STUDIES:  Ct Angio Head W Or Wo Contrast  Result Date: 09/04/2016 CLINICAL DATA:  Status post tPA administration.  Left-sided weakness and numbness. EXAM: CT ANGIOGRAPHY HEAD TECHNIQUE: Multidetector CT imaging of the head was performed using the standard protocol during bolus administration of intravenous contrast. Multiplanar CT image reconstructions and MIPs were obtained to evaluate the vascular anatomy. CONTRAST:  50 mL Isovue 370 IV COMPARISON:  Head CT 09/04/2016 Brain MRI 07/05/2008 FINDINGS: CTA HEAD Right frontal lobe encephalomalacia is again noted. No acute hemorrhage. Anterior circulation: --Intracranial internal carotid arteries: Normal. --Anterior cerebral arteries: Normal. --Middle cerebral arteries: Normal. --Posterior communicating arteries: Absent bilaterally. Posterior circulation: --Posterior cerebral arteries: Normal. --Superior cerebellar arteries: Normal. --Basilar artery: Normal. --Anterior inferior cerebellar arteries: Normal. --Posterior inferior cerebellar arteries: Normal. --Vertebral arteries: There is mild atherosclerotic calcification of both vertebral artery V4 segments without  hemodynamically significant stenosis. Venous sinuses: As permitted by contrast timing, patent. Anatomic variants: None IMPRESSION: 1. No intracranial arterial occlusion or high-grade stenosis. 2. Right frontal lobe encephalomalacia secondary to remote infarct. 3. No acute hemorrhage. Electronically Signed   By: Ulyses Jarred M.D.   On: 09/04/2016 21:15   Ct Head Wo Contrast  Result Date: 09/05/2016 CLINICAL DATA:  Twenty-four follow-up exam status post tPA. EXAM: CT HEAD WITHOUT CONTRAST TECHNIQUE: Contiguous axial images were obtained from the base of the skull through the vertex without intravenous contrast. COMPARISON:  Prior CT from 09/04/2016. FINDINGS: Brain: Stable atrophy with chronic microvascular ischemic disease. Encephalomalacia within the right frontal lobe compatible with remote right MCA territory infarct, stable. No evidence for acute intracranial hemorrhage or other complication status post tPA. No  definite evolving large vessel territory infarct identified. No mass lesion or midline shift. No mass effect. No hydrocephalus. No extra-axial fluid collection. Vascular: No hyperdense vessel.  Intracranial atherosclerosis noted. Skull: Scalp soft tissues within normal limits.  Calvarium intact. Sinuses/Orbits: The globes and orbital soft tissues within normal limits. Chronic right maxillary and ethmoidal sinus disease noted. Paranasal sinuses are otherwise clear. No mastoid effusion. IMPRESSION: 1. No evidence for acute intracranial hemorrhage or other complication status post tPA. 2. New hypodensity involving the posterior mesial right temporal lobe, suspicious for evolving ischemia. Electronically Signed   By: Jeannine Boga M.D.   On: 09/05/2016 21:57   Ct Angio Neck W Or Wo Contrast  Result Date: 09/08/2016 CLINICAL DATA:  75 year old male status post tPA for code stroke presentation on 09/04/2016. Right PCA and left cerebellar infarcts on brain MRI. Initial encounter. EXAM: CT ANGIOGRAPHY NECK TECHNIQUE: Multidetector CT imaging of the neck was performed using the standard protocol during bolus administration of intravenous contrast. Multiplanar CT image reconstructions and MIPs were obtained to evaluate the vascular anatomy. Carotid stenosis measurements (when applicable) are obtained utilizing NASCET criteria, using the distal internal carotid diameter as the denominator. CONTRAST:  50 mL Isovue 370 COMPARISON:  Brain MRI and intracranial MRA 09/06/2016. CTA head 09/04/2016. FINDINGS: Skeleton: Intermittent dental caries and periapical dental lucency. Degenerative changes throughout the cervical spine. No acute osseous abnormality identified. Right maxillary sinus mucoperiosteal thickening and subtotal opacification is mildly progressed. Continued anterior right ethmoid opacification. Other Visualized paranasal sinuses and mastoids are stable and well pneumatized. Other neck: Thyroid, larynx,  pharynx, parapharyngeal spaces, retropharyngeal space, sublingual space, submandibular glands and parotid glands are within normal limits. No cervical lymphadenopathy. Negative visualized orbit and scalp soft tissues. Partially visible hypodensity in the right PCA territory. No intracranial mass effect is evident. Upper chest: No superior mediastinal lymphadenopathy. 6-7 mm right upper lobe lung nodules. 9 mm distal peribronchial nodule in the anterior left upper lobe. No prior chest CT. No pleural effusion. Aortic arch: 3 vessel arch configuration. Mild to moderate arch and great vessels soft and calcified atherosclerosis. Right carotid system: No brachiocephalic or right CCA origin stenosis despite plaque. Intermittent soft and calcified plaque in the right CCA. Extensive soft and calcified plaque at the distal right CCA continuing into the right carotid bifurcation. However, stenosis is less than 50 % with respect to the distal vessel. Distal to the bulb the cervical right ICA is normal. The visible right ICA siphon is patent with calcified plaques stable to the recent CTA head. Left carotid system: No left CCA origin stenosis despite plaque. Mildly tortuous left CCA. Soft and calcified plaque at the left carotid bifurcation and left ICA bulb is mild compared to  the right. No stenosis. Negative cervical left ICA otherwise. Visible left ICA siphon is stable with confluent calcified plaque. Vertebral arteries: No proximal right subclavian artery stenosis despite calcified plaque. Normal right vertebral artery origin. The right vertebral artery is normal to the skullbase. There is right V4 segment calcified plaque without hemodynamically significant stenosis. The right PICA origin is patent. Patent vertebrobasilar junction. Negative visible basilar artery. No proximal left subclavian artery stenosis despite soft and calcified plaque. Calcified plaque at the left vertebral artery origin resulting in moderate to  severe stenosis (series 401, images 36 and 37 and coronal image 141). The vertebral arteries are codominant. The cervical left vertebral artery is otherwise normal. There is calcified plaque in the left vertebral artery V4 segment without significant stenosis. Left PICA origin is normal. Normal vertebrobasilar junction. IMPRESSION: 1. Right greater than left carotid bifurcation and proximal ICA atherosclerosis without hemodynamically significant cervical carotid stenosis. Partially visible extensive ICA siphon calcified plaque is stable from the recent head CTA. 2. Moderate to severe stenosis at the left vertebral artery origin related to calcified plaque. Bilateral V4 segment calcified plaque but no other significant bilateral vertebral artery stenosis. Both PICA origins are patent. 3. Bilateral nonspecific upper lobe pulmonary nodules ranging from 6-9 mm. Recommend follow-up Chest CT (noncontrast study should suffice). 4. Partially visible right PCA territory infarct with expected evolution. No intracranial mass effect is evident. Electronically Signed   By: Genevie Ann M.D.   On: 09/08/2016 16:34   Mr Brain Wo Contrast  Result Date: 09/06/2016 CLINICAL DATA:  Left-sided face, arm and leg weakness with slurred speech beginning yesterday. EXAM: MRI HEAD WITHOUT CONTRAST MRA HEAD WITHOUT CONTRAST TECHNIQUE: Multiplanar, multiecho pulse sequences of the brain and surrounding structures were obtained without intravenous contrast. Angiographic images of the head were obtained using MRA technique without contrast. COMPARISON:  CT studies 09/04/2016 and 09/05/2016. FINDINGS: MRI HEAD FINDINGS Brain: Few small foci of acute infarction within the left cerebellum. Acute infarction in the right PCA territory affecting the posteromedial temporal lobe and occipital lobe. Few punctate foci of infarction in the right thalamus. No anterior circulation acute infarction. There is an old right posterior frontal cortical and  subcortical infarction, with some involvement of the postcentral gyrus as well. Mild chronic small-vessel ischemic change of the cerebral hemispheric deep white matter. Vascular: Major vessels at the base of the brain show flow. Skull and upper cervical spine: Negative Sinuses/Orbits: Complete opacification of the right maxillary sinus and the right anterior ethmoid sinuses. Other: None MRA HEAD FINDINGS Both internal carotid arteries are widely patent into the brain. No siphon stenosis. The anterior and middle cerebral vessels are patent without proximal stenosis, aneurysm or vascular malformation. Both vertebral arteries are patent to the basilar. There is flow in both posterior inferior cerebellar arteries. Basilar artery appears normal. Flow is present in both superior cerebellar and posterior cerebral arteries. There are stenoses in both PCA territories, but flow does persist. IMPRESSION: Acute infarction in the right PCA territory affecting the posteromedial temporal lobe, occipital lobe and right thalamus. Few small infarctions in the left cerebellum. No mass effect or hemorrhage. Remote right MCA territory infarction. Intracranial MR angiography does not show any anterior circulation stenosis or occlusion. Posterior circulation branch vessels show flow, but there are stenoses within both PCA territories. Flow in the right PCA shows partial recanalization compared to the CTA study of 08/2014. Electronically Signed   By: Nelson Chimes M.D.   On: 09/06/2016 11:19   Mr Jodene Nam Head/brain  Wo Cm  Result Date: 09/06/2016 CLINICAL DATA:  Left-sided face, arm and leg weakness with slurred speech beginning yesterday. EXAM: MRI HEAD WITHOUT CONTRAST MRA HEAD WITHOUT CONTRAST TECHNIQUE: Multiplanar, multiecho pulse sequences of the brain and surrounding structures were obtained without intravenous contrast. Angiographic images of the head were obtained using MRA technique without contrast. COMPARISON:  CT studies  09/04/2016 and 09/05/2016. FINDINGS: MRI HEAD FINDINGS Brain: Few small foci of acute infarction within the left cerebellum. Acute infarction in the right PCA territory affecting the posteromedial temporal lobe and occipital lobe. Few punctate foci of infarction in the right thalamus. No anterior circulation acute infarction. There is an old right posterior frontal cortical and subcortical infarction, with some involvement of the postcentral gyrus as well. Mild chronic small-vessel ischemic change of the cerebral hemispheric deep white matter. Vascular: Major vessels at the base of the brain show flow. Skull and upper cervical spine: Negative Sinuses/Orbits: Complete opacification of the right maxillary sinus and the right anterior ethmoid sinuses. Other: None MRA HEAD FINDINGS Both internal carotid arteries are widely patent into the brain. No siphon stenosis. The anterior and middle cerebral vessels are patent without proximal stenosis, aneurysm or vascular malformation. Both vertebral arteries are patent to the basilar. There is flow in both posterior inferior cerebellar arteries. Basilar artery appears normal. Flow is present in both superior cerebellar and posterior cerebral arteries. There are stenoses in both PCA territories, but flow does persist. IMPRESSION: Acute infarction in the right PCA territory affecting the posteromedial temporal lobe, occipital lobe and right thalamus. Few small infarctions in the left cerebellum. No mass effect or hemorrhage. Remote right MCA territory infarction. Intracranial MR angiography does not show any anterior circulation stenosis or occlusion. Posterior circulation branch vessels show flow, but there are stenoses within both PCA territories. Flow in the right PCA shows partial recanalization compared to the CTA study of 08/2014. Electronically Signed   By: Nelson Chimes M.D.   On: 09/06/2016 11:19   Ct Head Code Stroke W/o Cm  Result Date: 09/04/2016 CLINICAL DATA:   Code stroke.  Left facial droop EXAM: CT HEAD WITHOUT CONTRAST TECHNIQUE: Contiguous axial images were obtained from the base of the skull through the vertex without intravenous contrast. COMPARISON:  Head CT 07/05/2008 FINDINGS: Brain: No mass lesion, intraparenchymal hemorrhage or extra-axial collection. No evidence of acute cortical infarct. There is right frontal lobe encephalomalacia along the precentral gyrus, unchanged from the prior study. There is periventricular hypoattenuation compatible with chronic microvascular disease. Vascular: No hyperdense vessel or unexpected calcification. Skull: Normal visualized skull base, calvarium and extracranial soft tissues. Sinuses/Orbits: Moderate opacification of the right maxillary sinus and anterior ethmoid air cells. Normal orbits. ASPECTS [facility] Stroke Program Early CT Score) - Ganglionic level infarction (caudate, lentiform nuclei, internal capsule, insula, M1-M3 cortex): 7 - Supraganglionic infarction (M4-M6 cortex): 3 Total score (0-10 with 10 being normal): 10 IMPRESSION: 1. No acute intracranial abnormality. Unchanged appearance of old right frontal infarct. 2. ASPECTS is 10. These results were called by telephone at the time of interpretation on 09/04/2016 at 6:05 pm to Dr. Noemi Chapel , who verbally acknowledged these results. Electronically Signed   By: Ulyses Jarred M.D.   On: 09/04/2016 18:06     PATHOLOGY:    ASSESSMENT AND PLAN:  Anemia Normocytic, normochromic anemia in the setting of Stage III chronic renal disease secondary to HTN/DM- insulin dependent (well controlled).  Colonoscopy performed by Dr. Laural Golden in Jan 2017.  Recent right PCA acute infarction requiring tPA  on 09/04/2016 at 1831 hours requiring transfer to [facility] hospital and hospitalization from 09/04/2016- 09/08/2016.  Cause of embolic stroke from newly diagnosed A-fib.  Now anticoagulated with Eliquis and ASA for stroke and cardiac prevention. AND An element of iron  deficiency requiring IV ferric gluconate for low iron saturation and low serum iron with intolerance to PO iron.  Labs today: CBC diff.  I personally reviewed and went over laboratory results with the patient.  The results are noted within this dictation.  HGB is 10.2 g/dL today.  We suspect anemia is secondary to chronic renal disease and therefore, he would benefit from ESA therapy at renal dosing.  If improvement in HGB is not noted in the near future, then bone marrow aspiration and biopsy will be recommended given his elevated serum creatinine and elevated kappa/lambda light chain ratio.  His Aranesp has not yet been approved by his insurance.  We are waiting on this prior to initiating treatment.  We recommend a referral to nephrology for management of renal dysfunction.  He is on the verge of transitioning to Stage IV renal dysfunction based upon available eGFRs provided by primary care provider.  He does have a left knee gouty flare.  I have discussed treatment options with Dr. [name].  He recommended 125 mg Solu-Medrol IM (1 mg/kg) and Cochicine.  I have ordered both medications.  I have also given him #30 of Hydrocodone for pain control.  Calculated CrCl 60.62 and therefore no dosing adjustment is needed for Colchicine.   He will return after the New Year to start Aranesp as we are waiting insurance approval.  Labs in 2 weeks: CBC diff, iron/TIBC, ferritin.  Labs every 2 weeks: CBC diff.  Return in 8 weeks for follow-up and evaluation of response to treatment.  B12 deficiency B12 deficiency requiring B12 IM injections weekly x 4.  Negative antibody testing and therefore, following IM injections, he can be placed on oral B12.  B12 injection given today for his 4th weekly injection.  I have recommended OTC B12 2000 mcg daily.   ORDERS PLACED FOR THIS ENCOUNTER: No orders of the defined types were placed in this encounter.   MEDICATIONS PRESCRIBED THIS ENCOUNTER: Meds ordered  this encounter  Medications  . HYDROcodone-acetaminophen (NORCO/VICODIN) 5-325 MG tablet    Sig: Take 1 tablet by mouth every 6 (six) hours as needed for moderate pain.    Dispense:  30 tablet    Refill:  0    One time Rx    Order Specific Question:   Supervising Provider    Answer:   [name] [id number]  . Colchicine 0.6 MG CAPS    Sig: Take 1.2 mg PO now and then 0.6 mg PO every 12 hours x 3 days    Dispense:  10 capsule    Refill:  0    Order Specific Question:   Supervising Provider    Answer:   [name] [id number]  . methylPREDNISolone sodium succinate (SOLU-MEDROL) 125 mg/2 mL injection 125 mg    THERAPY PLAN:  We will start Aranesp at renal dosing when approved by insurance. He will start PO B12 daily.  All questions were answered. The patient knows to call the clinic with any problems, questions or concerns. We can certainly see the patient much sooner if necessary.  Patient and plan discussed with Dr. Ancil Linsey and she is in agreement with the aforementioned.   This note is electronically signed by: Doy Mince 09/10/2016 6:21 PM

## 2016-09-10 NOTE — Assessment & Plan Note (Addendum)
B12 deficiency requiring B12 IM injections weekly x 4.  Negative antibody testing and therefore, following IM injections, he can be placed on oral B12.  B12 injection given today for his 4th weekly injection.  I have recommended OTC B12 5000 mcg sublingually daily.

## 2016-09-10 NOTE — Patient Instructions (Addendum)
[facility] at [facility] Discharge Instructions  RECOMMENDATIONS MADE BY THE CONSULTANT AND ANY TEST RESULTS WILL BE SENT TO YOUR REFERRING PHYSICIAN.  Received Vit B12 injection today. Follow-up as scheduled. Call clinic for any questions or concerns  Thank you for choosing [facility] at [facility] to provide your oncology and hematology care.  To afford each patient quality time with our provider, please arrive at least 15 minutes before your scheduled appointment time.   Beginning January 23rd 2017 lab work for the Cancer Center will be done in the  Main lab at [place] on 1st floor. If you have a lab appointment with the Cancer Center please come in thru the  Main Entrance and check in at the main information desk  You need to re-schedule your appointment should you arrive 10 or more minutes late.  We strive to give you quality time with our providers, and arriving late affects you and other patients whose appointments are after yours.  Also, if you no show three or more times for appointments you may be dismissed from the clinic at the providers discretion.     Again, thank you for choosing [facility].  Our hope is that these requests will decrease the amount of time that you wait before being seen by our physicians.       _____________________________________________________________  Should you have questions after your visit to  Cancer Center, please contact our office at [phone] between the hours of 8:30 a.m. and 4:30 p.m.  Voicemails left after 4:30 p.m. will not be returned until the following business day.  For prescription refill requests, have your pharmacy contact our office.         Resources For Cancer Patients and their Caregivers ? American Cancer Society: Can assist with transportation, wigs, general needs, runs Look Good Feel Better.        1-888-227-6333 ? Cancer Care: Provides  financial assistance, online support groups, medication/co-pay assistance.  1-800-813-HOPE (4673) ? [facility] Assists [place] cancer patients and their families through emotional , educational and financial support.  336-427-4357 ? [place] DSS Where to apply for food stamps, Medicaid and utility assistance. 336-342-1394 ? RCATS: Transportation to medical appointments. 336-347-2287 ? Social Security Administration: May apply for disability if have a Stage IV cancer. 336-342-7796 1-800-772-1213 ? [place] Aging, Disability and Transit Services: Assists with nutrition, care and transit needs. 336-349-2343  Cancer Center Support Programs: @10RELATIVEDAYS@ > Cancer Support Group  2nd Tuesday of the month 1pm-2pm, Journey Room  > Creative Journey  3rd Tuesday of the month 1130am-1pm, Journey Room  > Look Good Feel Better  1st Wednesday of the month 10am-12 noon, Journey Room (Call American Cancer Society to register [phone])   

## 2016-09-10 NOTE — Progress Notes (Signed)
Pt given solu-medrol in left hip. Pt tolerated well. Pt stable and discharged home in wheelchair with daughter.

## 2016-09-10 NOTE — Patient Instructions (Addendum)
[place] at [facility] Discharge Instructions  RECOMMENDATIONS MADE BY THE CONSULTANT AND ANY TEST RESULTS WILL BE SENT TO YOUR REFERRING PHYSICIAN.  You were seen today by [name] PA-C. Rx given for Hydrocodone. Return after the new year for labs and aranesp. Labs and Aranesp every 2 weeks. Return in 8 weeks for follow up.    Thank you for choosing [place] at [facility] to provide your oncology and hematology care.  To afford each patient quality time with our provider, please arrive at least 15 minutes before your scheduled appointment time.   Beginning January 23rd 2017 lab work for the [facility] will be done in the  Main lab at [facility] on 1st floor. If you have a lab appointment with the [place] please come in thru the  Main Entrance and check in at the main information desk  You need to re-schedule your appointment should you arrive 10 or more minutes late.  We strive to give you quality time with our providers, and arriving late affects you and other patients whose appointments are after yours.  Also, if you no show three or more times for appointments you may be dismissed from the clinic at the providers discretion.     Again, thank you for choosing [facility].  Our hope is that these requests will decrease the amount of time that you wait before being seen by our physicians.       _____________________________________________________________  Should you have questions after your visit to [facility], please contact our office at [phone] between the hours of 8:30 a.m. and 4:30 p.m.  Voicemails left after 4:30 p.m. will not be returned until the following business day.  For prescription refill requests, have your pharmacy contact our office.         Resources For Cancer Patients and their Caregivers ? American Cancer Society: Can assist with transportation, wigs, general  needs, runs Look Good Feel Better.        (838)291-2451 ? Cancer Care: Provides financial assistance, online support groups, medication/co-pay assistance.  1-800-813-HOPE (707)518-3934) ? [place] Assists [place] cancer patients and their families through emotional , educational and financial support.  425-086-3612 ? [place] DSS Where to apply for food stamps, Medicaid and utility assistance. (681)708-1732 ? RCATS: Transportation to medical appointments. (930)094-1645 ? Social Security Administration: May apply for disability if have a Stage IV cancer. 2071475611 (937) 358-6322 ? [employer], Disability and Transit Services: Assists with nutrition, care and transit needs. [place] Support Programs: @10RELATIVEDAYS @ > Cancer Support Group  2nd Tuesday of the month 1pm-2pm, Journey Room  > Creative Journey  3rd Tuesday of the month 1130am-1pm, Journey Room  > Look Good Feel Better  1st Wednesday of the month 10am-12 noon, Journey Room (Call [place] to register [phone])

## 2016-09-10 NOTE — Assessment & Plan Note (Addendum)
Normocytic, normochromic anemia in the setting of Stage III chronic renal disease secondary to HTN/DM- insulin dependent (well controlled).  Colonoscopy performed by Dr. Laural Golden in Jan 2017.  Recent right PCA acute infarction requiring tPA on 09/04/2016 at 1831 hours requiring transfer to [facility] hospital and hospitalization from 09/04/2016- 09/08/2016.  Cause of embolic stroke from newly diagnosed A-fib.  Now anticoagulated with Eliquis and ASA for stroke and cardiac prevention. AND An element of iron deficiency requiring IV ferric gluconate for low iron saturation and low serum iron with intolerance to PO iron.  Labs today: CBC diff.  I personally reviewed and went over laboratory results with the patient.  The results are noted within this dictation.  HGB is 10.2 g/dL today.  We suspect anemia is secondary to chronic renal disease and therefore, he would benefit from ESA therapy at renal dosing.  If improvement in HGB is not noted in the near future, then bone marrow aspiration and biopsy will be recommended given his elevated serum creatinine and elevated kappa/lambda light chain ratio.  His Aranesp has not yet been approved by his insurance.  We are waiting on this prior to initiating treatment.  We recommend a referral to nephrology for management of renal dysfunction.  He is on the verge of transitioning to Stage IV renal dysfunction based upon available eGFRs provided by primary care provider.  He does have a left knee gouty flare.  I have discussed treatment options with Dr. [name].  He recommended 125 mg Solu-Medrol IM (1 mg/kg) and Cochicine.  I have ordered both medications.  I have also given him #30 of Hydrocodone for pain control.  Calculated CrCl 60.62 and therefore no dosing adjustment is needed for Colchicine.   He will return after the New Year to start Aranesp as we are waiting insurance approval.  Labs in 2 weeks: CBC diff, iron/TIBC, ferritin.  Labs every 2 weeks: CBC  diff.  Return in 8 weeks for follow-up and evaluation of response to treatment.

## 2016-09-10 NOTE — Progress Notes (Signed)
Stephen [name] tolerated Vit B12 injection well without complaints or incident. Aranesp not yet authorized thru insurance so it was held. [name] PA notified. Hgb 10.2. Pt discharged via wheelchair in satisfactory condition accompanied by family member

## 2016-09-11 ENCOUNTER — Telehealth (HOSPITAL_COMMUNITY): Payer: Self-pay

## 2016-09-11 NOTE — Telephone Encounter (Signed)
Called patient and he is on oral B12 that was prescribed while he was at [facility]?

## 2016-09-16 ENCOUNTER — Other Ambulatory Visit (HOSPITAL_COMMUNITY): Payer: Self-pay | Admitting: Oncology

## 2016-09-16 DIAGNOSIS — I129 Hypertensive chronic kidney disease with stage 1 through stage 4 chronic kidney disease, or unspecified chronic kidney disease: Secondary | ICD-10-CM | POA: Diagnosis not present

## 2016-09-16 DIAGNOSIS — I4891 Unspecified atrial fibrillation: Secondary | ICD-10-CM | POA: Diagnosis not present

## 2016-09-16 DIAGNOSIS — M109 Gout, unspecified: Secondary | ICD-10-CM

## 2016-09-16 DIAGNOSIS — N183 Chronic kidney disease, stage 3 (moderate): Secondary | ICD-10-CM | POA: Diagnosis not present

## 2016-09-16 DIAGNOSIS — I69292 Facial weakness following other nontraumatic intracranial hemorrhage: Secondary | ICD-10-CM | POA: Diagnosis not present

## 2016-09-16 DIAGNOSIS — Z7982 Long term (current) use of aspirin: Secondary | ICD-10-CM | POA: Diagnosis not present

## 2016-09-16 DIAGNOSIS — E669 Obesity, unspecified: Secondary | ICD-10-CM | POA: Diagnosis not present

## 2016-09-16 DIAGNOSIS — I69354 Hemiplegia and hemiparesis following cerebral infarction affecting left non-dominant side: Secondary | ICD-10-CM | POA: Diagnosis not present

## 2016-09-16 DIAGNOSIS — Z794 Long term (current) use of insulin: Secondary | ICD-10-CM | POA: Diagnosis not present

## 2016-09-16 DIAGNOSIS — E785 Hyperlipidemia, unspecified: Secondary | ICD-10-CM | POA: Diagnosis not present

## 2016-09-16 DIAGNOSIS — E1122 Type 2 diabetes mellitus with diabetic chronic kidney disease: Secondary | ICD-10-CM | POA: Diagnosis not present

## 2016-09-16 DIAGNOSIS — I2510 Atherosclerotic heart disease of native coronary artery without angina pectoris: Secondary | ICD-10-CM | POA: Diagnosis not present

## 2016-09-16 DIAGNOSIS — I350 Nonrheumatic aortic (valve) stenosis: Secondary | ICD-10-CM | POA: Diagnosis not present

## 2016-09-17 ENCOUNTER — Other Ambulatory Visit (HOSPITAL_COMMUNITY): Payer: Self-pay | Admitting: Oncology

## 2016-09-17 DIAGNOSIS — M109 Gout, unspecified: Secondary | ICD-10-CM

## 2016-09-17 DIAGNOSIS — I2510 Atherosclerotic heart disease of native coronary artery without angina pectoris: Secondary | ICD-10-CM | POA: Diagnosis not present

## 2016-09-17 DIAGNOSIS — I4891 Unspecified atrial fibrillation: Secondary | ICD-10-CM | POA: Diagnosis not present

## 2016-09-17 DIAGNOSIS — E785 Hyperlipidemia, unspecified: Secondary | ICD-10-CM | POA: Diagnosis not present

## 2016-09-17 DIAGNOSIS — E669 Obesity, unspecified: Secondary | ICD-10-CM | POA: Diagnosis not present

## 2016-09-17 DIAGNOSIS — N183 Chronic kidney disease, stage 3 (moderate): Secondary | ICD-10-CM | POA: Diagnosis not present

## 2016-09-17 DIAGNOSIS — Z7982 Long term (current) use of aspirin: Secondary | ICD-10-CM | POA: Diagnosis not present

## 2016-09-17 DIAGNOSIS — E1122 Type 2 diabetes mellitus with diabetic chronic kidney disease: Secondary | ICD-10-CM | POA: Diagnosis not present

## 2016-09-17 DIAGNOSIS — I69292 Facial weakness following other nontraumatic intracranial hemorrhage: Secondary | ICD-10-CM | POA: Diagnosis not present

## 2016-09-17 DIAGNOSIS — I69354 Hemiplegia and hemiparesis following cerebral infarction affecting left non-dominant side: Secondary | ICD-10-CM | POA: Diagnosis not present

## 2016-09-17 DIAGNOSIS — I350 Nonrheumatic aortic (valve) stenosis: Secondary | ICD-10-CM | POA: Diagnosis not present

## 2016-09-17 DIAGNOSIS — Z794 Long term (current) use of insulin: Secondary | ICD-10-CM | POA: Diagnosis not present

## 2016-09-17 DIAGNOSIS — I129 Hypertensive chronic kidney disease with stage 1 through stage 4 chronic kidney disease, or unspecified chronic kidney disease: Secondary | ICD-10-CM | POA: Diagnosis not present

## 2016-09-18 DIAGNOSIS — Z794 Long term (current) use of insulin: Secondary | ICD-10-CM | POA: Diagnosis not present

## 2016-09-18 DIAGNOSIS — Z7982 Long term (current) use of aspirin: Secondary | ICD-10-CM | POA: Diagnosis not present

## 2016-09-18 DIAGNOSIS — I4891 Unspecified atrial fibrillation: Secondary | ICD-10-CM | POA: Diagnosis not present

## 2016-09-18 DIAGNOSIS — E785 Hyperlipidemia, unspecified: Secondary | ICD-10-CM | POA: Diagnosis not present

## 2016-09-18 DIAGNOSIS — I69292 Facial weakness following other nontraumatic intracranial hemorrhage: Secondary | ICD-10-CM | POA: Diagnosis not present

## 2016-09-18 DIAGNOSIS — I350 Nonrheumatic aortic (valve) stenosis: Secondary | ICD-10-CM | POA: Diagnosis not present

## 2016-09-18 DIAGNOSIS — I129 Hypertensive chronic kidney disease with stage 1 through stage 4 chronic kidney disease, or unspecified chronic kidney disease: Secondary | ICD-10-CM | POA: Diagnosis not present

## 2016-09-18 DIAGNOSIS — I2510 Atherosclerotic heart disease of native coronary artery without angina pectoris: Secondary | ICD-10-CM | POA: Diagnosis not present

## 2016-09-18 DIAGNOSIS — E669 Obesity, unspecified: Secondary | ICD-10-CM | POA: Diagnosis not present

## 2016-09-18 DIAGNOSIS — E1122 Type 2 diabetes mellitus with diabetic chronic kidney disease: Secondary | ICD-10-CM | POA: Diagnosis not present

## 2016-09-18 DIAGNOSIS — N183 Chronic kidney disease, stage 3 (moderate): Secondary | ICD-10-CM | POA: Diagnosis not present

## 2016-09-18 DIAGNOSIS — I69354 Hemiplegia and hemiparesis following cerebral infarction affecting left non-dominant side: Secondary | ICD-10-CM | POA: Diagnosis not present

## 2016-09-21 DIAGNOSIS — E1122 Type 2 diabetes mellitus with diabetic chronic kidney disease: Secondary | ICD-10-CM | POA: Diagnosis not present

## 2016-09-21 DIAGNOSIS — I69292 Facial weakness following other nontraumatic intracranial hemorrhage: Secondary | ICD-10-CM | POA: Diagnosis not present

## 2016-09-21 DIAGNOSIS — I69354 Hemiplegia and hemiparesis following cerebral infarction affecting left non-dominant side: Secondary | ICD-10-CM | POA: Diagnosis not present

## 2016-09-21 DIAGNOSIS — E669 Obesity, unspecified: Secondary | ICD-10-CM | POA: Diagnosis not present

## 2016-09-21 DIAGNOSIS — I129 Hypertensive chronic kidney disease with stage 1 through stage 4 chronic kidney disease, or unspecified chronic kidney disease: Secondary | ICD-10-CM | POA: Diagnosis not present

## 2016-09-21 DIAGNOSIS — I350 Nonrheumatic aortic (valve) stenosis: Secondary | ICD-10-CM | POA: Diagnosis not present

## 2016-09-21 DIAGNOSIS — I2510 Atherosclerotic heart disease of native coronary artery without angina pectoris: Secondary | ICD-10-CM | POA: Diagnosis not present

## 2016-09-21 DIAGNOSIS — Z794 Long term (current) use of insulin: Secondary | ICD-10-CM | POA: Diagnosis not present

## 2016-09-21 DIAGNOSIS — N183 Chronic kidney disease, stage 3 (moderate): Secondary | ICD-10-CM | POA: Diagnosis not present

## 2016-09-21 DIAGNOSIS — I4891 Unspecified atrial fibrillation: Secondary | ICD-10-CM | POA: Diagnosis not present

## 2016-09-21 DIAGNOSIS — Z7982 Long term (current) use of aspirin: Secondary | ICD-10-CM | POA: Diagnosis not present

## 2016-09-21 DIAGNOSIS — E785 Hyperlipidemia, unspecified: Secondary | ICD-10-CM | POA: Diagnosis not present

## 2016-09-22 ENCOUNTER — Other Ambulatory Visit (HOSPITAL_COMMUNITY): Payer: Self-pay | Admitting: *Deleted

## 2016-09-22 DIAGNOSIS — D631 Anemia in chronic kidney disease: Secondary | ICD-10-CM

## 2016-09-22 DIAGNOSIS — I4891 Unspecified atrial fibrillation: Secondary | ICD-10-CM | POA: Diagnosis not present

## 2016-09-22 DIAGNOSIS — M10062 Idiopathic gout, left knee: Secondary | ICD-10-CM | POA: Diagnosis not present

## 2016-09-22 DIAGNOSIS — Z8673 Personal history of transient ischemic attack (TIA), and cerebral infarction without residual deficits: Secondary | ICD-10-CM | POA: Diagnosis not present

## 2016-09-22 DIAGNOSIS — N183 Chronic kidney disease, stage 3 unspecified: Secondary | ICD-10-CM

## 2016-09-22 DIAGNOSIS — E119 Type 2 diabetes mellitus without complications: Secondary | ICD-10-CM | POA: Diagnosis not present

## 2016-09-23 DIAGNOSIS — E785 Hyperlipidemia, unspecified: Secondary | ICD-10-CM | POA: Diagnosis not present

## 2016-09-23 DIAGNOSIS — I4891 Unspecified atrial fibrillation: Secondary | ICD-10-CM | POA: Diagnosis not present

## 2016-09-23 DIAGNOSIS — I69292 Facial weakness following other nontraumatic intracranial hemorrhage: Secondary | ICD-10-CM | POA: Diagnosis not present

## 2016-09-23 DIAGNOSIS — E1122 Type 2 diabetes mellitus with diabetic chronic kidney disease: Secondary | ICD-10-CM | POA: Diagnosis not present

## 2016-09-23 DIAGNOSIS — I129 Hypertensive chronic kidney disease with stage 1 through stage 4 chronic kidney disease, or unspecified chronic kidney disease: Secondary | ICD-10-CM | POA: Diagnosis not present

## 2016-09-23 DIAGNOSIS — I2510 Atherosclerotic heart disease of native coronary artery without angina pectoris: Secondary | ICD-10-CM | POA: Diagnosis not present

## 2016-09-23 DIAGNOSIS — Z7982 Long term (current) use of aspirin: Secondary | ICD-10-CM | POA: Diagnosis not present

## 2016-09-23 DIAGNOSIS — I69354 Hemiplegia and hemiparesis following cerebral infarction affecting left non-dominant side: Secondary | ICD-10-CM | POA: Diagnosis not present

## 2016-09-23 DIAGNOSIS — N183 Chronic kidney disease, stage 3 (moderate): Secondary | ICD-10-CM | POA: Diagnosis not present

## 2016-09-23 DIAGNOSIS — E669 Obesity, unspecified: Secondary | ICD-10-CM | POA: Diagnosis not present

## 2016-09-23 DIAGNOSIS — Z794 Long term (current) use of insulin: Secondary | ICD-10-CM | POA: Diagnosis not present

## 2016-09-23 DIAGNOSIS — I350 Nonrheumatic aortic (valve) stenosis: Secondary | ICD-10-CM | POA: Diagnosis not present

## 2016-09-24 ENCOUNTER — Other Ambulatory Visit (HOSPITAL_COMMUNITY): Payer: PPO

## 2016-09-24 ENCOUNTER — Ambulatory Visit (HOSPITAL_COMMUNITY): Payer: PPO

## 2016-09-25 DIAGNOSIS — N183 Chronic kidney disease, stage 3 (moderate): Secondary | ICD-10-CM | POA: Diagnosis not present

## 2016-09-25 DIAGNOSIS — Z794 Long term (current) use of insulin: Secondary | ICD-10-CM | POA: Diagnosis not present

## 2016-09-25 DIAGNOSIS — E1122 Type 2 diabetes mellitus with diabetic chronic kidney disease: Secondary | ICD-10-CM | POA: Diagnosis not present

## 2016-09-25 DIAGNOSIS — I350 Nonrheumatic aortic (valve) stenosis: Secondary | ICD-10-CM | POA: Diagnosis not present

## 2016-09-25 DIAGNOSIS — E669 Obesity, unspecified: Secondary | ICD-10-CM | POA: Diagnosis not present

## 2016-09-25 DIAGNOSIS — I69354 Hemiplegia and hemiparesis following cerebral infarction affecting left non-dominant side: Secondary | ICD-10-CM | POA: Diagnosis not present

## 2016-09-25 DIAGNOSIS — Z7982 Long term (current) use of aspirin: Secondary | ICD-10-CM | POA: Diagnosis not present

## 2016-09-25 DIAGNOSIS — E785 Hyperlipidemia, unspecified: Secondary | ICD-10-CM | POA: Diagnosis not present

## 2016-09-25 DIAGNOSIS — I69292 Facial weakness following other nontraumatic intracranial hemorrhage: Secondary | ICD-10-CM | POA: Diagnosis not present

## 2016-09-25 DIAGNOSIS — I129 Hypertensive chronic kidney disease with stage 1 through stage 4 chronic kidney disease, or unspecified chronic kidney disease: Secondary | ICD-10-CM | POA: Diagnosis not present

## 2016-09-25 DIAGNOSIS — I4891 Unspecified atrial fibrillation: Secondary | ICD-10-CM | POA: Diagnosis not present

## 2016-09-25 DIAGNOSIS — I2510 Atherosclerotic heart disease of native coronary artery without angina pectoris: Secondary | ICD-10-CM | POA: Diagnosis not present

## 2016-09-26 ENCOUNTER — Encounter (HOSPITAL_COMMUNITY): Payer: Self-pay | Admitting: Hematology & Oncology

## 2016-09-28 DIAGNOSIS — I129 Hypertensive chronic kidney disease with stage 1 through stage 4 chronic kidney disease, or unspecified chronic kidney disease: Secondary | ICD-10-CM | POA: Diagnosis not present

## 2016-09-28 DIAGNOSIS — E785 Hyperlipidemia, unspecified: Secondary | ICD-10-CM | POA: Diagnosis not present

## 2016-09-28 DIAGNOSIS — Z7982 Long term (current) use of aspirin: Secondary | ICD-10-CM | POA: Diagnosis not present

## 2016-09-28 DIAGNOSIS — I69354 Hemiplegia and hemiparesis following cerebral infarction affecting left non-dominant side: Secondary | ICD-10-CM | POA: Diagnosis not present

## 2016-09-28 DIAGNOSIS — N183 Chronic kidney disease, stage 3 (moderate): Secondary | ICD-10-CM | POA: Diagnosis not present

## 2016-09-28 DIAGNOSIS — Z794 Long term (current) use of insulin: Secondary | ICD-10-CM | POA: Diagnosis not present

## 2016-09-28 DIAGNOSIS — I69292 Facial weakness following other nontraumatic intracranial hemorrhage: Secondary | ICD-10-CM | POA: Diagnosis not present

## 2016-09-28 DIAGNOSIS — E669 Obesity, unspecified: Secondary | ICD-10-CM | POA: Diagnosis not present

## 2016-09-28 DIAGNOSIS — I350 Nonrheumatic aortic (valve) stenosis: Secondary | ICD-10-CM | POA: Diagnosis not present

## 2016-09-28 DIAGNOSIS — I2510 Atherosclerotic heart disease of native coronary artery without angina pectoris: Secondary | ICD-10-CM | POA: Diagnosis not present

## 2016-09-28 DIAGNOSIS — E1122 Type 2 diabetes mellitus with diabetic chronic kidney disease: Secondary | ICD-10-CM | POA: Diagnosis not present

## 2016-09-28 DIAGNOSIS — I4891 Unspecified atrial fibrillation: Secondary | ICD-10-CM | POA: Diagnosis not present

## 2016-09-29 DIAGNOSIS — I350 Nonrheumatic aortic (valve) stenosis: Secondary | ICD-10-CM | POA: Diagnosis not present

## 2016-09-29 DIAGNOSIS — Z7982 Long term (current) use of aspirin: Secondary | ICD-10-CM | POA: Diagnosis not present

## 2016-09-29 DIAGNOSIS — I69292 Facial weakness following other nontraumatic intracranial hemorrhage: Secondary | ICD-10-CM | POA: Diagnosis not present

## 2016-09-29 DIAGNOSIS — N183 Chronic kidney disease, stage 3 (moderate): Secondary | ICD-10-CM | POA: Diagnosis not present

## 2016-09-29 DIAGNOSIS — I69354 Hemiplegia and hemiparesis following cerebral infarction affecting left non-dominant side: Secondary | ICD-10-CM | POA: Diagnosis not present

## 2016-09-29 DIAGNOSIS — I4891 Unspecified atrial fibrillation: Secondary | ICD-10-CM | POA: Diagnosis not present

## 2016-09-29 DIAGNOSIS — E669 Obesity, unspecified: Secondary | ICD-10-CM | POA: Diagnosis not present

## 2016-09-29 DIAGNOSIS — E1122 Type 2 diabetes mellitus with diabetic chronic kidney disease: Secondary | ICD-10-CM | POA: Diagnosis not present

## 2016-09-29 DIAGNOSIS — E785 Hyperlipidemia, unspecified: Secondary | ICD-10-CM | POA: Diagnosis not present

## 2016-09-29 DIAGNOSIS — I2510 Atherosclerotic heart disease of native coronary artery without angina pectoris: Secondary | ICD-10-CM | POA: Diagnosis not present

## 2016-09-29 DIAGNOSIS — Z794 Long term (current) use of insulin: Secondary | ICD-10-CM | POA: Diagnosis not present

## 2016-09-29 DIAGNOSIS — I129 Hypertensive chronic kidney disease with stage 1 through stage 4 chronic kidney disease, or unspecified chronic kidney disease: Secondary | ICD-10-CM | POA: Diagnosis not present

## 2016-10-01 ENCOUNTER — Telehealth: Payer: Self-pay

## 2016-10-01 DIAGNOSIS — E669 Obesity, unspecified: Secondary | ICD-10-CM | POA: Diagnosis not present

## 2016-10-01 DIAGNOSIS — I4891 Unspecified atrial fibrillation: Secondary | ICD-10-CM | POA: Diagnosis not present

## 2016-10-01 DIAGNOSIS — N183 Chronic kidney disease, stage 3 (moderate): Secondary | ICD-10-CM | POA: Diagnosis not present

## 2016-10-01 DIAGNOSIS — Z7982 Long term (current) use of aspirin: Secondary | ICD-10-CM | POA: Diagnosis not present

## 2016-10-01 DIAGNOSIS — E785 Hyperlipidemia, unspecified: Secondary | ICD-10-CM | POA: Diagnosis not present

## 2016-10-01 DIAGNOSIS — I350 Nonrheumatic aortic (valve) stenosis: Secondary | ICD-10-CM | POA: Diagnosis not present

## 2016-10-01 DIAGNOSIS — I69354 Hemiplegia and hemiparesis following cerebral infarction affecting left non-dominant side: Secondary | ICD-10-CM | POA: Diagnosis not present

## 2016-10-01 DIAGNOSIS — E1122 Type 2 diabetes mellitus with diabetic chronic kidney disease: Secondary | ICD-10-CM | POA: Diagnosis not present

## 2016-10-01 DIAGNOSIS — I129 Hypertensive chronic kidney disease with stage 1 through stage 4 chronic kidney disease, or unspecified chronic kidney disease: Secondary | ICD-10-CM | POA: Diagnosis not present

## 2016-10-01 DIAGNOSIS — I2510 Atherosclerotic heart disease of native coronary artery without angina pectoris: Secondary | ICD-10-CM | POA: Diagnosis not present

## 2016-10-01 DIAGNOSIS — Z794 Long term (current) use of insulin: Secondary | ICD-10-CM | POA: Diagnosis not present

## 2016-10-01 DIAGNOSIS — I69292 Facial weakness following other nontraumatic intracranial hemorrhage: Secondary | ICD-10-CM | POA: Diagnosis not present

## 2016-10-01 NOTE — Progress Notes (Signed)
Dr. Erlinda Hong sign patient assistance letter and sent back to Mary Sella at [address].

## 2016-10-01 NOTE — Telephone Encounter (Signed)
Rn call Garnetta Buddy at [phone]. Rn stated the forms can be fax to [phone]. The application is for pt to receive the elquis for free with [place] prescription assistance. Forms attach and sign by Dr. Erlinda Hong. The form only required Dr. Erlinda Hong signature.

## 2016-10-05 DIAGNOSIS — I69354 Hemiplegia and hemiparesis following cerebral infarction affecting left non-dominant side: Secondary | ICD-10-CM | POA: Diagnosis not present

## 2016-10-05 DIAGNOSIS — E669 Obesity, unspecified: Secondary | ICD-10-CM | POA: Diagnosis not present

## 2016-10-05 DIAGNOSIS — I350 Nonrheumatic aortic (valve) stenosis: Secondary | ICD-10-CM | POA: Diagnosis not present

## 2016-10-05 DIAGNOSIS — I2510 Atherosclerotic heart disease of native coronary artery without angina pectoris: Secondary | ICD-10-CM | POA: Diagnosis not present

## 2016-10-05 DIAGNOSIS — Z794 Long term (current) use of insulin: Secondary | ICD-10-CM | POA: Diagnosis not present

## 2016-10-05 DIAGNOSIS — E785 Hyperlipidemia, unspecified: Secondary | ICD-10-CM | POA: Diagnosis not present

## 2016-10-05 DIAGNOSIS — N183 Chronic kidney disease, stage 3 (moderate): Secondary | ICD-10-CM | POA: Diagnosis not present

## 2016-10-05 DIAGNOSIS — I129 Hypertensive chronic kidney disease with stage 1 through stage 4 chronic kidney disease, or unspecified chronic kidney disease: Secondary | ICD-10-CM | POA: Diagnosis not present

## 2016-10-05 DIAGNOSIS — I4891 Unspecified atrial fibrillation: Secondary | ICD-10-CM | POA: Diagnosis not present

## 2016-10-05 DIAGNOSIS — I69292 Facial weakness following other nontraumatic intracranial hemorrhage: Secondary | ICD-10-CM | POA: Diagnosis not present

## 2016-10-05 DIAGNOSIS — E1122 Type 2 diabetes mellitus with diabetic chronic kidney disease: Secondary | ICD-10-CM | POA: Diagnosis not present

## 2016-10-05 DIAGNOSIS — Z7982 Long term (current) use of aspirin: Secondary | ICD-10-CM | POA: Diagnosis not present

## 2016-10-07 ENCOUNTER — Other Ambulatory Visit (HOSPITAL_COMMUNITY): Payer: Self-pay | Admitting: *Deleted

## 2016-10-08 ENCOUNTER — Encounter (HOSPITAL_COMMUNITY): Payer: PPO | Attending: Hematology & Oncology

## 2016-10-08 ENCOUNTER — Other Ambulatory Visit (HOSPITAL_COMMUNITY): Payer: Self-pay | Admitting: Hematology & Oncology

## 2016-10-08 ENCOUNTER — Encounter (HOSPITAL_COMMUNITY): Payer: PPO

## 2016-10-08 VITALS — BP 93/46 | HR 46 | Temp 98.2°F | Resp 18

## 2016-10-08 DIAGNOSIS — I2510 Atherosclerotic heart disease of native coronary artery without angina pectoris: Secondary | ICD-10-CM | POA: Diagnosis not present

## 2016-10-08 DIAGNOSIS — D631 Anemia in chronic kidney disease: Secondary | ICD-10-CM | POA: Diagnosis not present

## 2016-10-08 DIAGNOSIS — D649 Anemia, unspecified: Secondary | ICD-10-CM | POA: Diagnosis not present

## 2016-10-08 DIAGNOSIS — I129 Hypertensive chronic kidney disease with stage 1 through stage 4 chronic kidney disease, or unspecified chronic kidney disease: Secondary | ICD-10-CM | POA: Diagnosis not present

## 2016-10-08 DIAGNOSIS — Z7982 Long term (current) use of aspirin: Secondary | ICD-10-CM | POA: Diagnosis not present

## 2016-10-08 DIAGNOSIS — Z794 Long term (current) use of insulin: Secondary | ICD-10-CM | POA: Diagnosis not present

## 2016-10-08 DIAGNOSIS — N183 Chronic kidney disease, stage 3 unspecified: Secondary | ICD-10-CM

## 2016-10-08 DIAGNOSIS — E538 Deficiency of other specified B group vitamins: Secondary | ICD-10-CM | POA: Diagnosis not present

## 2016-10-08 DIAGNOSIS — I350 Nonrheumatic aortic (valve) stenosis: Secondary | ICD-10-CM | POA: Diagnosis not present

## 2016-10-08 DIAGNOSIS — I69292 Facial weakness following other nontraumatic intracranial hemorrhage: Secondary | ICD-10-CM | POA: Diagnosis not present

## 2016-10-08 DIAGNOSIS — I69354 Hemiplegia and hemiparesis following cerebral infarction affecting left non-dominant side: Secondary | ICD-10-CM | POA: Diagnosis not present

## 2016-10-08 DIAGNOSIS — E669 Obesity, unspecified: Secondary | ICD-10-CM | POA: Diagnosis not present

## 2016-10-08 DIAGNOSIS — I4891 Unspecified atrial fibrillation: Secondary | ICD-10-CM | POA: Diagnosis not present

## 2016-10-08 DIAGNOSIS — E785 Hyperlipidemia, unspecified: Secondary | ICD-10-CM | POA: Diagnosis not present

## 2016-10-08 DIAGNOSIS — E1122 Type 2 diabetes mellitus with diabetic chronic kidney disease: Secondary | ICD-10-CM | POA: Diagnosis not present

## 2016-10-08 LAB — CBC WITH DIFFERENTIAL/PLATELET
Basophils Absolute: 0 10*3/uL (ref 0.0–0.1)
Basophils Relative: 1 %
EOS ABS: 0.3 10*3/uL (ref 0.0–0.7)
Eosinophils Relative: 5 %
HEMATOCRIT: 30.8 % — AB (ref 39.0–52.0)
HEMOGLOBIN: 9.5 g/dL — AB (ref 13.0–17.0)
LYMPHS ABS: 1.9 10*3/uL (ref 0.7–4.0)
Lymphocytes Relative: 29 %
MCH: 29.6 pg (ref 26.0–34.0)
MCHC: 30.8 g/dL (ref 30.0–36.0)
MCV: 96 fL (ref 78.0–100.0)
MONOS PCT: 13 %
Monocytes Absolute: 0.9 10*3/uL (ref 0.1–1.0)
NEUTROS ABS: 3.5 10*3/uL (ref 1.7–7.7)
NEUTROS PCT: 52 %
Platelets: 146 10*3/uL — ABNORMAL LOW (ref 150–400)
RBC: 3.21 MIL/uL — AB (ref 4.22–5.81)
RDW: 15.7 % — ABNORMAL HIGH (ref 11.5–15.5)
WBC: 6.6 10*3/uL (ref 4.0–10.5)

## 2016-10-08 LAB — FERRITIN: FERRITIN: 296 ng/mL (ref 24–336)

## 2016-10-08 LAB — IRON AND TIBC
IRON: 50 ug/dL (ref 45–182)
Saturation Ratios: 17 % — ABNORMAL LOW (ref 17.9–39.5)
TIBC: 295 ug/dL (ref 250–450)
UIBC: 245 ug/dL

## 2016-10-08 MED ORDER — DARBEPOETIN ALFA 100 MCG/0.5ML IJ SOSY
80.0000 ug | PREFILLED_SYRINGE | Freq: Once | INTRAMUSCULAR | Status: AC
  Administered 2016-10-08: 80 ug via SUBCUTANEOUS
  Filled 2016-10-08: qty 0.5

## 2016-10-08 MED ORDER — CYANOCOBALAMIN 1000 MCG/ML IJ SOLN
1000.0000 ug | Freq: Once | INTRAMUSCULAR | Status: AC
  Administered 2016-10-08: 1000 ug via INTRAMUSCULAR
  Filled 2016-10-08: qty 1

## 2016-10-08 NOTE — Patient Instructions (Signed)
[place] at [facility] Discharge Instructions  RECOMMENDATIONS MADE BY THE CONSULTANT AND ANY TEST RESULTS WILL BE SENT TO YOUR REFERRING PHYSICIAN.  Hemoglobin 9.5 today. Aranesp 80 mcg injection given as ordered.  Lab prior to aranesp every 2 weeks. Vitamin B12 1000 mcg injection given as ordered. B12 injections once monthly. Return as scheduled.  Thank you for choosing [place] at [facility] to provide your oncology and hematology care.  To afford each patient quality time with our provider, please arrive at least 15 minutes before your scheduled appointment time.    If you have a lab appointment with the [facility] please come in thru the  Main Entrance and check in at the main information desk  You need to re-schedule your appointment should you arrive 10 or more minutes late.  We strive to give you quality time with our providers, and arriving late affects you and other patients whose appointments are after yours.  Also, if you no show three or more times for appointments you may be dismissed from the clinic at the providers discretion.     Again, thank you for choosing [facility].  Our hope is that these requests will decrease the amount of time that you wait before being seen by our physicians.       _____________________________________________________________  Should you have questions after your visit to [facility], please contact our office at [phone] between the hours of 8:30 a.m. and 4:30 p.m.  Voicemails left after 4:30 p.m. will not be returned until the following business day.  For prescription refill requests, have your pharmacy contact our office.       Resources For Cancer Patients and their Caregivers ? American Cancer Society: Can assist with transportation, wigs, general needs, runs Look Good Feel Better.        520-884-8452 ? Cancer Care: Provides financial assistance,  online support groups, medication/co-pay assistance.  1-800-813-HOPE 6621420450) ? [place] Assists [place] cancer patients and their families through emotional , educational and financial support.  5706398479 ? [place] DSS Where to apply for food stamps, Medicaid and utility assistance. 939-446-0509 ? RCATS: Transportation to medical appointments. (201)152-6041 ? Social Security Administration: May apply for disability if have a Stage IV cancer. 409-425-5744 902-622-2542 ? [employer], Disability and Transit Services: Assists with nutrition, care and transit needs. [employer] Support Programs: @10RELATIVEDAYS @ > Cancer Support Group  2nd Tuesday of the month 1pm-2pm, Journey Room  > Creative Journey  3rd Tuesday of the month 1130am-1pm, Journey Room  > Look Good Feel Better  1st Wednesday of the month 10am-12 noon, Journey Room (Call [place] to register [phone])

## 2016-10-08 NOTE — Progress Notes (Signed)
Les Pou presents today for injection per MD orders. Aranesp 80 mcg administered SQ in left Abdomen. Administration without incident. Patient tolerated well. Les Pou presents today for injection per MD orders. B12 1000 mcg administered IM in right Upper Arm. Administration without incident. Patient tolerated well.

## 2016-10-12 ENCOUNTER — Ambulatory Visit (HOSPITAL_COMMUNITY): Payer: PPO

## 2016-10-21 ENCOUNTER — Other Ambulatory Visit (HOSPITAL_COMMUNITY): Payer: Self-pay | Admitting: *Deleted

## 2016-10-21 DIAGNOSIS — D631 Anemia in chronic kidney disease: Secondary | ICD-10-CM

## 2016-10-21 DIAGNOSIS — N183 Chronic kidney disease, stage 3 unspecified: Secondary | ICD-10-CM

## 2016-10-22 ENCOUNTER — Encounter (HOSPITAL_COMMUNITY): Payer: PPO | Attending: Oncology

## 2016-10-22 ENCOUNTER — Encounter (HOSPITAL_COMMUNITY): Payer: Self-pay

## 2016-10-22 ENCOUNTER — Encounter (HOSPITAL_COMMUNITY): Payer: PPO

## 2016-10-22 VITALS — BP 109/31 | HR 80 | Temp 97.9°F | Resp 18

## 2016-10-22 DIAGNOSIS — E1122 Type 2 diabetes mellitus with diabetic chronic kidney disease: Secondary | ICD-10-CM | POA: Diagnosis not present

## 2016-10-22 DIAGNOSIS — Z8673 Personal history of transient ischemic attack (TIA), and cerebral infarction without residual deficits: Secondary | ICD-10-CM | POA: Diagnosis not present

## 2016-10-22 DIAGNOSIS — D649 Anemia, unspecified: Secondary | ICD-10-CM | POA: Diagnosis not present

## 2016-10-22 DIAGNOSIS — Z833 Family history of diabetes mellitus: Secondary | ICD-10-CM | POA: Diagnosis not present

## 2016-10-22 DIAGNOSIS — N183 Chronic kidney disease, stage 3 unspecified: Secondary | ICD-10-CM

## 2016-10-22 DIAGNOSIS — D631 Anemia in chronic kidney disease: Secondary | ICD-10-CM

## 2016-10-22 DIAGNOSIS — Z9889 Other specified postprocedural states: Secondary | ICD-10-CM | POA: Diagnosis not present

## 2016-10-22 DIAGNOSIS — E538 Deficiency of other specified B group vitamins: Secondary | ICD-10-CM

## 2016-10-22 DIAGNOSIS — Z87891 Personal history of nicotine dependence: Secondary | ICD-10-CM | POA: Insufficient documentation

## 2016-10-22 DIAGNOSIS — Z5189 Encounter for other specified aftercare: Secondary | ICD-10-CM | POA: Diagnosis not present

## 2016-10-22 DIAGNOSIS — Z8249 Family history of ischemic heart disease and other diseases of the circulatory system: Secondary | ICD-10-CM | POA: Insufficient documentation

## 2016-10-22 LAB — CBC WITH DIFFERENTIAL/PLATELET
BASOS PCT: 1 %
Basophils Absolute: 0 10*3/uL (ref 0.0–0.1)
EOS ABS: 0.2 10*3/uL (ref 0.0–0.7)
EOS PCT: 4 %
HCT: 29.8 % — ABNORMAL LOW (ref 39.0–52.0)
Hemoglobin: 9.7 g/dL — ABNORMAL LOW (ref 13.0–17.0)
Lymphocytes Relative: 40 %
Lymphs Abs: 2 10*3/uL (ref 0.7–4.0)
MCH: 31.1 pg (ref 26.0–34.0)
MCHC: 32.6 g/dL (ref 30.0–36.0)
MCV: 95.5 fL (ref 78.0–100.0)
MONO ABS: 0.5 10*3/uL (ref 0.1–1.0)
MONOS PCT: 10 %
NEUTROS PCT: 45 %
Neutro Abs: 2.2 10*3/uL (ref 1.7–7.7)
PLATELETS: 150 10*3/uL (ref 150–400)
RBC: 3.12 MIL/uL — ABNORMAL LOW (ref 4.22–5.81)
RDW: 16.7 % — AB (ref 11.5–15.5)
WBC: 5 10*3/uL (ref 4.0–10.5)

## 2016-10-22 MED ORDER — DARBEPOETIN ALFA 100 MCG/0.5ML IJ SOSY
80.0000 ug | PREFILLED_SYRINGE | Freq: Once | INTRAMUSCULAR | Status: AC
  Administered 2016-10-22: 80 ug via SUBCUTANEOUS

## 2016-10-22 MED ORDER — DARBEPOETIN ALFA 100 MCG/0.5ML IJ SOSY
PREFILLED_SYRINGE | INTRAMUSCULAR | Status: AC
  Filled 2016-10-22: qty 0.5

## 2016-10-22 NOTE — Patient Instructions (Signed)
[facility] at [facility] Discharge Instructions  RECOMMENDATIONS MADE BY THE CONSULTANT AND ANY TEST RESULTS WILL BE SENT TO YOUR REFERRING PHYSICIAN.  Aranesp given today  Follow up as scheduled.  Thank you for choosing [facility] at [facility] to provide your oncology and hematology care.  To afford each patient quality time with our provider, please arrive at least 15 minutes before your scheduled appointment time.    If you have a lab appointment with the Cancer Center please come in thru the  Main Entrance and check in at the main information desk  You need to re-schedule your appointment should you arrive 10 or more minutes late.  We strive to give you quality time with our providers, and arriving late affects you and other patients whose appointments are after yours.  Also, if you no show three or more times for appointments you may be dismissed from the clinic at the providers discretion.     Again, thank you for choosing [facility].  Our hope is that these requests will decrease the amount of time that you wait before being seen by our physicians.       _____________________________________________________________  Should you have questions after your visit to [facility], please contact our office at [phone] between the hours of 8:30 a.m. and 4:30 p.m.  Voicemails left after 4:30 p.m. will not be returned until the following business day.  For prescription refill requests, have your pharmacy contact our office.       Resources For Cancer Patients and their Caregivers ? American Cancer Society: Can assist with transportation, wigs, general needs, runs Look Good Feel Better.        1-888-227-6333 ? Cancer Care: Provides financial assistance, online support groups, medication/co-pay assistance.  1-800-813-HOPE (4673) ? [facility] Assists [place] cancer patients and  their families through emotional , educational and financial support.  336-427-4357 ? [place] DSS Where to apply for food stamps, Medicaid and utility assistance. 336-342-1394 ? RCATS: Transportation to medical appointments. 336-347-2287 ? Social Security Administration: May apply for disability if have a Stage IV cancer. 336-342-7796 1-800-772-1213 ? [place] Aging, Disability and Transit Services: Assists with nutrition, care and transit needs. 336-349-2343  Cancer Center Support Programs: @10RELATIVEDAYS@ > Cancer Support Group  2nd Tuesday of the month 1pm-2pm, Journey Room  > Creative Journey  3rd Tuesday of the month 1130am-1pm, Journey Room  > Look Good Feel Better  1st Wednesday of the month 10am-12 noon, Journey Room (Call American Cancer Society to register [phone])   

## 2016-10-22 NOTE — Progress Notes (Signed)
Stephen [name] presents today for injection per MD orders. Aranesp 63mcg administered SQ in left Abdomen. Administration without incident. Patient tolerated well. Vitals stable and discharged home from clinic ambulatory.follow up as scheduled.

## 2016-10-25 ENCOUNTER — Encounter (HOSPITAL_COMMUNITY): Payer: Self-pay | Admitting: Emergency Medicine

## 2016-10-25 ENCOUNTER — Emergency Department (HOSPITAL_COMMUNITY)
Admission: EM | Admit: 2016-10-25 | Discharge: 2016-10-25 | Disposition: A | Discharge status: Home / Self Care | Payer: PPO | Attending: Emergency Medicine | Admitting: Emergency Medicine

## 2016-10-25 DIAGNOSIS — R220 Localized swelling, mass and lump, head: Secondary | ICD-10-CM | POA: Diagnosis not present

## 2016-10-25 DIAGNOSIS — K047 Periapical abscess without sinus: Secondary | ICD-10-CM | POA: Insufficient documentation

## 2016-10-25 DIAGNOSIS — Z7982 Long term (current) use of aspirin: Secondary | ICD-10-CM | POA: Insufficient documentation

## 2016-10-25 DIAGNOSIS — N183 Chronic kidney disease, stage 3 (moderate): Secondary | ICD-10-CM | POA: Diagnosis not present

## 2016-10-25 DIAGNOSIS — Z87891 Personal history of nicotine dependence: Secondary | ICD-10-CM | POA: Insufficient documentation

## 2016-10-25 DIAGNOSIS — Z794 Long term (current) use of insulin: Secondary | ICD-10-CM | POA: Insufficient documentation

## 2016-10-25 DIAGNOSIS — I2510 Atherosclerotic heart disease of native coronary artery without angina pectoris: Secondary | ICD-10-CM | POA: Diagnosis not present

## 2016-10-25 DIAGNOSIS — I5032 Chronic diastolic (congestive) heart failure: Secondary | ICD-10-CM | POA: Insufficient documentation

## 2016-10-25 DIAGNOSIS — I130 Hypertensive heart and chronic kidney disease with heart failure and stage 1 through stage 4 chronic kidney disease, or unspecified chronic kidney disease: Secondary | ICD-10-CM | POA: Diagnosis not present

## 2016-10-25 DIAGNOSIS — E1122 Type 2 diabetes mellitus with diabetic chronic kidney disease: Secondary | ICD-10-CM | POA: Diagnosis not present

## 2016-10-25 MED ORDER — HYDROCODONE-ACETAMINOPHEN 5-325 MG PO TABS: 1.0000 | ORAL_TABLET | Freq: Four times a day (QID) | ORAL | 0 refills | Status: DC | PRN

## 2016-10-25 MED ORDER — AMOXICILLIN-POT CLAVULANATE 875-125 MG PO TABS: 1.0000 | ORAL_TABLET | Freq: Two times a day (BID) | ORAL | 0 refills | Status: DC

## 2016-10-25 MED ORDER — AMOXICILLIN-POT CLAVULANATE 875-125 MG PO TABS
1.0000 | ORAL_TABLET | Freq: Once | ORAL | Status: AC
  Administered 2016-10-25: 1 via ORAL
  Filled 2016-10-25: qty 1

## 2016-10-25 MED ORDER — HYDROCODONE-ACETAMINOPHEN 5-325 MG PO TABS
1.0000 | ORAL_TABLET | Freq: Once | ORAL | Status: AC
  Administered 2016-10-25: 1 via ORAL
  Filled 2016-10-25: qty 1

## 2016-10-25 NOTE — ED Provider Notes (Signed)
[facility] DEPT Provider Note   CSN: [id number] Arrival date & time: 10/25/16  1608     History   Chief Complaint Chief Complaint  Patient presents with  . Facial Swelling    HPI Stephen [name] is a 76 y.o. male.  HPI 76 year old male who presents with left facial swelling. History of CVA on Eliquis, CAD, HTN, HLD, and DM. Onset of left sided dental pain two days ago with gradual swelling of the left side of the chin. No fever, chills, nausea, vomiting, hyperglycemia at home. No neck pain. No difficulty breathing, no voice changes, no difficulty swallowing. Has yet to make dentist appointment but has a dentist in mind he will follow-up with.   Past Medical History:  Diagnosis Date  . Anemia 06/01/2012   H&H of 10/30 in 8/13 with normal MCV   . B12 deficiency 08/17/2016  . Carotid artery occlusion   . Celiac disease   . Cerebrovascular disease    H/o CVA In 2001 followed by a right carotid endarterectomy; left carotid endarterectomy in 2008  . Coronary atherosclerosis of native coronary artery    PTCA of LAD in 1994; total obstruction of the RCA treated medically; subsequent stress nuclear study with inferior ischemia  . DDD (degenerative disc disease), lumbar    Lumbar surgery in 1984  . Diabetes mellitus type II   . Essential hypertension, benign   . Hyperlipidemia   . Peripheral vascular disease ([place])   . Stroke [facility]) 2001   Right brain    Patient Active Problem List   Diagnosis Date Noted  . Stroke (cerebrum) ([place]) 09/04/2016  . B12 deficiency 08/17/2016  . PAD (peripheral artery disease) ([place]) 01/21/2016  . Irregular heartbeat 07/19/2014  . Atherosclerotic PVD with intermittent claudication ([place]) 07/17/2014  . Carotid stenosis 07/12/2014  . Aftercare following surgery of the circulatory system 07/12/2014  . Chronic diastolic heart failure ([place]) 09/27/2013  . Occlusion and stenosis of carotid artery without mention of cerebral infarction 06/28/2012  . Anemia  06/01/2012  . Chronic renal disease, stage 3, moderately decreased glomerular filtration rate between 30-59 mL/min/1.73 square meter 06/01/2012  . Diabetes mellitus type II   . Essential hypertension, benign   . Hyperlipidemia   . Cerebrovascular disease   . Coronary atherosclerosis of native coronary artery   . Peripheral vascular disease [facility])     Past Surgical History:  Procedure Laterality Date  . APPENDECTOMY  1970  . CAROTID ENDARTERECTOMY  2008   Left   . CAROTID ENDARTERECTOMY  06/15/2000   Right  . COLONOSCOPY N/A 10/10/2015   Procedure: COLONOSCOPY;  Surgeon: Rogene Houston, MD;  Location: [facility] ENDO SUITE;  Service: Endoscopy;  Laterality: N/A;  12:25 - moved to 11:15 - Ann to notify  . [place] SURGERY  2000, 2001   L5 HNP required 2 surgical procedures       Home Medications    Prior to Admission medications   Medication Sig Start Date End Date Taking? Authorizing Provider  acetaminophen (TYLENOL) 500 MG tablet Take 500 mg by mouth every 6 (six) hours as needed.    Historical Provider, MD  amLODipine (NORVASC) 5 MG tablet Take 5 mg by mouth daily.    Historical Provider, MD  apixaban (ELIQUIS) 5 MG TABS tablet Take 1 tablet (5 mg total) by mouth 2 (two) times daily. 09/08/16   Rosalin Hawking, MD  aspirin 81 MG tablet Take 81 mg by mouth daily.    Historical Provider, MD  atenolol (TENORMIN) 50 MG tablet Take 50 mg by mouth daily.      Historical Provider, MD  Colchicine 0.6 MG CAPS Take 1.2 mg PO now and then 0.6 mg PO every 12 hours x 3 days 09/10/16   Manon Hilding Kefalas, PA-C  Cyanocobalamin (B-12) 5000 MCG SUBL Place 5,000 mcg under the tongue daily. 09/10/16   Baird Cancer, PA-C  doxazosin (CARDURA) 2 MG tablet Take 2 mg by mouth daily.    Historical Provider, MD  furosemide (LASIX) 40 MG tablet Take 1 tablet (40 mg total) by mouth daily. TAKE 1 AND 1/2 TABLET (60 MG) IN THE AM AND TAKE 1 TABLET (40 MG) IN THE EVENING 09/08/16   Rosalin Hawking, MD    HYDROcodone-acetaminophen (NORCO/VICODIN) 5-325 MG tablet Take 1 tablet by mouth every 6 (six) hours as needed for moderate pain. 09/10/16   Baird Cancer, PA-C  insulin aspart (NOVOLOG) 100 UNIT/ML injection Inject 25 Units into the skin 2 (two) times daily.     Historical Provider, MD  insulin detemir (LEVEMIR) 100 UNIT/ML injection Inject 65 Units into the skin at bedtime.    Historical Provider, MD  Lansoprazole (PREVACID PO) Take by mouth as needed.    Historical Provider, MD  losartan (COZAAR) 25 MG tablet Take 25 mg by mouth daily.    Historical Provider, MD  pravastatin (PRAVACHOL) 40 MG tablet Take 40 mg by mouth daily.    Historical Provider, MD    Family History Family History  Problem Relation Age of Onset  . Heart disease Mother     Before age 77  . Diabetes Mother   . Heart attack Mother   . Diabetes Sister   . Heart disease Brother   . Diabetes Brother   . Diabetes Daughter   . Diabetes Son   . Hyperlipidemia Son     Social History Social History  Substance Use Topics  . Smoking status: Former Smoker    Packs/day: 3.00    Years: 10.00    Types: Cigarettes    Quit date: 09/21/1972  . Smokeless tobacco: Never Used  . Alcohol use No     Allergies   Patient has no known allergies.   Review of Systems Review of Systems  Constitutional: Negative for chills, fatigue and fever.  HENT: Positive for dental problem and facial swelling. Negative for sore throat.   Respiratory: Negative for shortness of breath.   Cardiovascular: Negative for chest pain.  Gastrointestinal: Negative for nausea and vomiting.  Musculoskeletal: Negative for neck pain.  Psychiatric/Behavioral: Negative for confusion.  All other systems reviewed and are negative.    Physical Exam Updated Vital Signs Pulse 84   Temp 98.4 F (36.9 C) (Oral)   Resp 18   Ht 6' (1.829 m)   Wt 232 lb (105.2 kg)   BMI 31.46 kg/m   Physical Exam Physical Exam  Nursing note and vitals  reviewed. Constitutional: Well developed, well nourished, non-toxic, and in no acute distress Head: Normocephalic and atraumatic.  Mouth/Throat: Oropharynx is clear and moist. There is mild soft tissue swelling over the left mandible. Tooth 19 fractured with cavities and TTP. No swelling or area of fluctuance Neck: Normal range of motion. Neck supple.  Cardiovascular: Normal rate and regular rhythm.   Pulmonary/Chest: Effort normal and breath sounds normal.  Abdominal: Soft. There is no tenderness. There is no rebound and no guarding.  Musculoskeletal: Normal range of motion.  Neurological: Alert, no facial droop, fluent speech, moves all extremities  symmetrically Skin: Skin is warm and dry.  Psychiatric: Cooperative   ED Treatments / Results  Labs (all labs ordered are listed, but only abnormal results are displayed) Labs Reviewed - No data to display  EKG  EKG Interpretation None       Radiology No results found.  Procedures Procedures (including critical care time)  Medications Ordered in ED Medications  HYDROcodone-acetaminophen (NORCO/VICODIN) 5-325 MG per tablet 1 tablet (1 tablet Oral Given 10/25/16 1655)  amoxicillin-clavulanate (AUGMENTIN) 875-125 MG per tablet 1 tablet (1 tablet Oral Given 10/25/16 1656)     Initial Impression / Assessment and Plan / ED Course  I have reviewed the triage vital signs and the nursing notes.  Pertinent labs & imaging results that were available during my care of the patient were reviewed by me and considered in my medical decision making (see chart for details).     76 year old male who presents with dental abscess. He is well appearing with normal vital signs. He is nontoxic and without systemic signs or symptoms of illness. There is mild soft tissue swelling over the left mandible reactive to his dental infection. No evidence of cellulitis and no concerns for deep space soft tissue neck infection. He will follow-up with his dentist  in 1-2 days for reevaluation. Will start on antibiotics in the interim.  Final Clinical Impressions(s) / ED Diagnoses   Final diagnoses:  Dental abscess    New Prescriptions New Prescriptions   No medications on file     Forde Dandy, MD 10/25/16 1657

## 2016-10-25 NOTE — ED Triage Notes (Signed)
PT c/o left lower dental pain with facial swelling x2 days. PT family reports temp of 99.6 prior to ED arrival and had 2 tylenol tablets one hour ago.

## 2016-10-25 NOTE — Discharge Instructions (Signed)
Please call your dentist for appointment on Monday  Return without fail for worsening symptoms, including fevers, difficulty breathing, worsening swelling, difficulty swallowing, difficulty controlling high blood sugars, or any other symptoms concerning to you.

## 2016-11-05 ENCOUNTER — Encounter (HOSPITAL_COMMUNITY): Payer: PPO

## 2016-11-05 ENCOUNTER — Encounter (HOSPITAL_COMMUNITY): Payer: Self-pay

## 2016-11-05 ENCOUNTER — Encounter (HOSPITAL_BASED_OUTPATIENT_CLINIC_OR_DEPARTMENT_OTHER): Payer: PPO | Admitting: Oncology

## 2016-11-05 ENCOUNTER — Encounter (HOSPITAL_BASED_OUTPATIENT_CLINIC_OR_DEPARTMENT_OTHER): Payer: PPO

## 2016-11-05 VITALS — BP 113/50 | HR 92 | Temp 98.4°F | Resp 18 | Wt 225.0 lb

## 2016-11-05 DIAGNOSIS — I129 Hypertensive chronic kidney disease with stage 1 through stage 4 chronic kidney disease, or unspecified chronic kidney disease: Secondary | ICD-10-CM | POA: Diagnosis not present

## 2016-11-05 DIAGNOSIS — D508 Other iron deficiency anemias: Secondary | ICD-10-CM

## 2016-11-05 DIAGNOSIS — E1122 Type 2 diabetes mellitus with diabetic chronic kidney disease: Secondary | ICD-10-CM | POA: Diagnosis not present

## 2016-11-05 DIAGNOSIS — E611 Iron deficiency: Secondary | ICD-10-CM

## 2016-11-05 DIAGNOSIS — E538 Deficiency of other specified B group vitamins: Secondary | ICD-10-CM

## 2016-11-05 DIAGNOSIS — N183 Chronic kidney disease, stage 3 unspecified: Secondary | ICD-10-CM

## 2016-11-05 DIAGNOSIS — I4891 Unspecified atrial fibrillation: Secondary | ICD-10-CM

## 2016-11-05 DIAGNOSIS — Z5189 Encounter for other specified aftercare: Secondary | ICD-10-CM | POA: Diagnosis not present

## 2016-11-05 DIAGNOSIS — I639 Cerebral infarction, unspecified: Secondary | ICD-10-CM

## 2016-11-05 DIAGNOSIS — D631 Anemia in chronic kidney disease: Secondary | ICD-10-CM

## 2016-11-05 LAB — CBC WITH DIFFERENTIAL/PLATELET
Basophils Absolute: 0.1 10*3/uL (ref 0.0–0.1)
Basophils Relative: 1 %
EOS ABS: 0.2 10*3/uL (ref 0.0–0.7)
Eosinophils Relative: 3 %
HCT: 29.4 % — ABNORMAL LOW (ref 39.0–52.0)
HEMOGLOBIN: 9.5 g/dL — AB (ref 13.0–17.0)
LYMPHS ABS: 1.7 10*3/uL (ref 0.7–4.0)
LYMPHS PCT: 27 %
MCH: 30.7 pg (ref 26.0–34.0)
MCHC: 32.3 g/dL (ref 30.0–36.0)
MCV: 95.1 fL (ref 78.0–100.0)
MONOS PCT: 9 %
Monocytes Absolute: 0.5 10*3/uL (ref 0.1–1.0)
NEUTROS PCT: 60 %
Neutro Abs: 3.7 10*3/uL (ref 1.7–7.7)
Platelets: 210 10*3/uL (ref 150–400)
RBC: 3.09 MIL/uL — AB (ref 4.22–5.81)
RDW: 16.9 % — ABNORMAL HIGH (ref 11.5–15.5)
WBC: 6.1 10*3/uL (ref 4.0–10.5)

## 2016-11-05 MED ORDER — DARBEPOETIN ALFA 100 MCG/0.5ML IJ SOSY
PREFILLED_SYRINGE | INTRAMUSCULAR | Status: AC
  Filled 2016-11-05: qty 0.5

## 2016-11-05 MED ORDER — CYANOCOBALAMIN 1000 MCG/ML IJ SOLN
1000.0000 ug | Freq: Once | INTRAMUSCULAR | Status: AC
  Administered 2016-11-05: 1000 ug via INTRAMUSCULAR

## 2016-11-05 MED ORDER — DARBEPOETIN ALFA 100 MCG/0.5ML IJ SOSY
80.0000 ug | PREFILLED_SYRINGE | Freq: Once | INTRAMUSCULAR | Status: AC
  Administered 2016-11-05: 80 ug via SUBCUTANEOUS

## 2016-11-05 MED ORDER — CYANOCOBALAMIN 1000 MCG/ML IJ SOLN
INTRAMUSCULAR | Status: AC
  Filled 2016-11-05: qty 1

## 2016-11-05 MED ORDER — VITAMIN B-12 1000 MCG PO TABS: ORAL_TABLET | ORAL | 5 refills | Status: DC

## 2016-11-05 NOTE — Patient Instructions (Signed)
[place] at [facility] Discharge Instructions  RECOMMENDATIONS MADE BY THE CONSULTANT AND ANY TEST RESULTS WILL BE SENT TO YOUR REFERRING PHYSICIAN.  You were seen today by Dr. Barron Schmid Follow up in 8 weeks with labwork See Amy up front for appointments   Thank you for choosing [place] at [facility] to provide your oncology and hematology care.  To afford each patient quality time with our provider, please arrive at least 15 minutes before your scheduled appointment time.    If you have a lab appointment with the [facility] please come in thru the  Main Entrance and check in at the main information desk  You need to re-schedule your appointment should you arrive 10 or more minutes late.  We strive to give you quality time with our providers, and arriving late affects you and other patients whose appointments are after yours.  Also, if you no show three or more times for appointments you may be dismissed from the clinic at the providers discretion.     Again, thank you for choosing [facility].  Our hope is that these requests will decrease the amount of time that you wait before being seen by our physicians.       _____________________________________________________________  Should you have questions after your visit to [facility], please contact our office at [phone] between the hours of 8:30 a.m. and 4:30 p.m.  Voicemails left after 4:30 p.m. will not be returned until the following business day.  For prescription refill requests, have your pharmacy contact our office.       Resources For Cancer Patients and their Caregivers ? American Cancer Society: Can assist with transportation, wigs, general needs, runs Look Good Feel Better.        (313)488-5035 ? Cancer Care: Provides financial assistance, online support groups, medication/co-pay assistance.  1-800-813-HOPE (438)250-4706) ? [facility] Assists [place] cancer patients and their families through emotional , educational and financial support.  937-565-6046 ? [place] DSS Where to apply for food stamps, Medicaid and utility assistance. 772 400 7553 ? RCATS: Transportation to medical appointments. 970-311-1502 ? Social Security Administration: May apply for disability if have a Stage IV cancer. 424-117-6206 801-010-8408 ? [employer], Disability and Transit Services: Assists with nutrition, care and transit needs. [place] Support Programs: @10RELATIVEDAYS @ > Cancer Support Group  2nd Tuesday of the month 1pm-2pm, Journey Room  > Creative Journey  3rd Tuesday of the month 1130am-1pm, Journey Room  > Look Good Feel Better  1st Wednesday of the month 10am-12 noon, Journey Room (Call [place] to register [phone])

## 2016-11-05 NOTE — Progress Notes (Signed)
[name], MD [place] [address]   CURRENT THERAPY: Aranesp 80 mcg [place] q2weeks INTERVAL HISTORY: Stephen [name] 76 y.o. male returns for followup of normocyctic, normochromic anemia in the setting of Stage III disease. He had another CVA in December 2017. MRI brain demonstrated acute infarction in the right PCA territory affecting the  posteromedial temporal lobe, occipital lobe and right thalamus, few small infarctions in the left cerebellum, no mass effect or hemorrhage. He received TPA on 09/04/16. His new CVA is believed to be due to embolic phenomenon from A.Fib.  He has received 2 doses of aranesp q2 weeks to date, first dose on 10/08/16. He denies any fatigue, shortness of breath, chest pain, new onset of focal weakness.   Review of Systems  Constitutional: Negative for chills, fever and malaise/fatigue.  HENT: Negative.   Eyes: Negative.   Respiratory: Negative.  Negative for cough.   Cardiovascular: Negative.  Negative for chest pain.  Gastrointestinal: Negative.  Negative for blood in stool and melena.  Genitourinary: Negative.   Musculoskeletal: Negative.  Negative for joint pain (left knee).  Skin: Negative.   Neurological: Negative.  Negative for focal weakness (left sided).  Endo/Heme/Allergies: Negative.   Psychiatric/Behavioral: Negative.     Past Medical History:  Diagnosis Date  . Anemia 06/01/2012   H&H of 10/30 in 8/13 with normal MCV   . B12 deficiency 08/17/2016  . Carotid artery occlusion   . Celiac disease   . Cerebrovascular disease    H/o CVA In 2001 followed by a right carotid endarterectomy; left carotid endarterectomy in 2008  . Coronary atherosclerosis of native coronary artery    PTCA of LAD in 1994; total obstruction of the RCA treated medically; subsequent stress nuclear study with inferior ischemia  . DDD (degenerative disc disease), lumbar    Lumbar surgery in 1984  . Diabetes mellitus type II   .  Essential hypertension, benign   . Hyperlipidemia   . Peripheral vascular disease ([place])   . Stroke [facility]) 2001   Right brain    Past Surgical History:  Procedure Laterality Date  . APPENDECTOMY  1970  . CAROTID ENDARTERECTOMY  2008   Left   . CAROTID ENDARTERECTOMY  06/15/2000   Right  . COLONOSCOPY N/A 10/10/2015   Procedure: COLONOSCOPY;  Surgeon: Rogene Houston, MD;  [facility];  Service: Endoscopy;  Laterality: N/A;  12:25 - moved to 11:15 - Ann to notify  . [place] SURGERY  2000, 2001   L5 HNP required 2 surgical procedures    Family History  Problem Relation Age of Onset  . Heart disease Mother     Before age 22  . Diabetes Mother   . Heart attack Mother   . Diabetes Sister   . Heart disease Brother   . Diabetes Brother   . Diabetes Daughter   . Diabetes Son   . Hyperlipidemia Son     Social History   Social History  . Marital status: Widowed    Spouse name: N/A  . Number of children: N/A  . Years of education: N/A   Social History Main Topics  . Smoking status: Former Smoker    Packs/day: 3.00    Years: 10.00    Types: Cigarettes    Quit date: 09/21/1972  . Smokeless tobacco: Never Used  . Alcohol use No  . Drug use: No  . Sexual activity: Yes    Partners: Male  Birth control/ protection: Post-menopausal   Other Topics Concern  . None   Social History Narrative  . None     PHYSICAL EXAMINATION  ECOG PERFORMANCE STATUS: 2 - Symptomatic, <50% confined to bed  Vitals:   11/05/16 1025  BP: (!) 113/50  Pulse: 92  Resp: 18  Temp: 98.4 F (36.9 C)    GENERAL:alert, well nourished, well developed SKIN: skin color, texture, turgor are normal, no rashes or significant lesions HEAD: Normocephalic, No masses, lesions, tenderness or abnormalities EYES: normal, EOMI, Conjunctiva are pink and non-injected EARS: External ears normal OROPHARYNX:lips, buccal mucosa, and tongue normal and mucous membranes are moist  NECK: supple,  trachea midline LUNGS: clear to auscultation  HEART: irregularly irregular ABDOMEN:abdomen soft, nontender, nondistended, and normal bowel sounds BACK: Back symmetric, no curvature. EXTREMITIES:no edema in bilateral lower extremities. NEURO: alert & oriented x 3 with fluent speech  LABORATORY DATA: CBC    Component Value Date/Time   WBC 6.1 11/05/2016 0936   RBC 3.09 (L) 11/05/2016 0936   HGB 9.5 (L) 11/05/2016 0936   HCT 29.4 (L) 11/05/2016 0936   PLT 210 11/05/2016 0936   MCV 95.1 11/05/2016 0936   MCH 30.7 11/05/2016 0936   MCHC 32.3 11/05/2016 0936   RDW 16.9 (H) 11/05/2016 0936   LYMPHSABS 1.7 11/05/2016 0936   MONOABS 0.5 11/05/2016 0936   EOSABS 0.2 11/05/2016 0936   BASOSABS 0.1 11/05/2016 0936      Chemistry      Component Value Date/Time   NA 138 09/08/2016 0600   K 4.6 09/08/2016 0600   CL 103 09/08/2016 0600   CO2 25 09/08/2016 0600   BUN 36 (H) 09/08/2016 0600   CREATININE 1.58 (H) 09/08/2016 0600      Component Value Date/Time   CALCIUM 9.1 09/08/2016 0600   ALKPHOS 61 09/04/2016 1755   AST 17 09/04/2016 1755   ALT 16 (L) 09/04/2016 1755   BILITOT 0.3 09/04/2016 1755        PENDING LABS:   RADIOGRAPHIC STUDIES:  No results found.   PATHOLOGY:    ASSESSMENT AND PLAN:   Anemia Normocytic, normochromic anemia in the setting of Stage III chronic renal disease secondary to HTN/DM- insulin dependent (well controlled).  Colonoscopy performed by Dr. Laural Golden in Jan 2017.  Recent right PCA acute infarction requiring tPA on 09/04/2016 at 1831 hours requiring transfer to [facility] hospital and hospitalization from 09/04/2016- 09/08/2016.  Cause of embolic stroke from newly diagnosed A-fib.  Now anticoagulated with Eliquis and ASA for stroke and cardiac prevention. AND An element of iron deficiency requiring IV ferric gluconate for low iron saturation and low serum iron with intolerance to PO iron.  Anemia is secondary to chronic renal disease and he  would benefit from ESA therapy at renal dosing.  If improvement in HGB is not noted in the near future, then bone marrow aspiration and biopsy will be recommended given his elevated serum creatinine and elevated kappa/lambda light chain ratio.  PLAN:  No improvement yet while on aranesp. I hesitate to increase his dose given his recent CVA. Continue to monitor H/H. Continue aranesp q2 weeks. Labs every 2 weeks: CBC diff.    B12 deficiency B12 deficiency requiring B12 IM injections weekly x 4.   I have recommended OTC B12 2000 mcg daily from now on.  Return in 8 weeks for follow-up and evaluation of response to treatment.   This note is electronically signed by: Twana First, MD 11/05/2016 10:37 AM

## 2016-11-05 NOTE — Progress Notes (Signed)
Stephen [name] [facility] presents today for injection per the provider's orders.  Aranesp and B12 administrations without incident; see MAR for injection details.  Patient tolerated procedure well and without incident.  No questions or complaints noted at this time.

## 2016-11-17 ENCOUNTER — Ambulatory Visit: Payer: Self-pay | Admitting: Neurology

## 2016-11-19 ENCOUNTER — Other Ambulatory Visit (HOSPITAL_COMMUNITY): Payer: Self-pay | Admitting: Oncology

## 2016-11-19 ENCOUNTER — Encounter (HOSPITAL_COMMUNITY): Payer: Self-pay

## 2016-11-19 ENCOUNTER — Encounter (HOSPITAL_COMMUNITY): Payer: PPO

## 2016-11-19 ENCOUNTER — Encounter (HOSPITAL_COMMUNITY): Payer: PPO | Attending: Oncology

## 2016-11-19 VITALS — BP 115/39 | HR 61 | Temp 98.0°F | Resp 18

## 2016-11-19 DIAGNOSIS — Z87891 Personal history of nicotine dependence: Secondary | ICD-10-CM | POA: Diagnosis not present

## 2016-11-19 DIAGNOSIS — Z8249 Family history of ischemic heart disease and other diseases of the circulatory system: Secondary | ICD-10-CM | POA: Diagnosis not present

## 2016-11-19 DIAGNOSIS — E1122 Type 2 diabetes mellitus with diabetic chronic kidney disease: Secondary | ICD-10-CM | POA: Insufficient documentation

## 2016-11-19 DIAGNOSIS — D631 Anemia in chronic kidney disease: Secondary | ICD-10-CM | POA: Diagnosis not present

## 2016-11-19 DIAGNOSIS — N183 Chronic kidney disease, stage 3 unspecified: Secondary | ICD-10-CM

## 2016-11-19 DIAGNOSIS — Z9889 Other specified postprocedural states: Secondary | ICD-10-CM | POA: Diagnosis not present

## 2016-11-19 DIAGNOSIS — Z8673 Personal history of transient ischemic attack (TIA), and cerebral infarction without residual deficits: Secondary | ICD-10-CM | POA: Diagnosis not present

## 2016-11-19 DIAGNOSIS — Z5189 Encounter for other specified aftercare: Secondary | ICD-10-CM | POA: Diagnosis not present

## 2016-11-19 DIAGNOSIS — Z833 Family history of diabetes mellitus: Secondary | ICD-10-CM | POA: Insufficient documentation

## 2016-11-19 DIAGNOSIS — E538 Deficiency of other specified B group vitamins: Secondary | ICD-10-CM | POA: Diagnosis not present

## 2016-11-19 DIAGNOSIS — D649 Anemia, unspecified: Secondary | ICD-10-CM | POA: Diagnosis not present

## 2016-11-19 LAB — CBC WITH DIFFERENTIAL/PLATELET
BASOS PCT: 1 %
Basophils Absolute: 0.1 10*3/uL (ref 0.0–0.1)
EOS ABS: 0.2 10*3/uL (ref 0.0–0.7)
Eosinophils Relative: 5 %
HCT: 30.8 % — ABNORMAL LOW (ref 39.0–52.0)
HEMOGLOBIN: 9.9 g/dL — AB (ref 13.0–17.0)
Lymphocytes Relative: 33 %
Lymphs Abs: 1.4 10*3/uL (ref 0.7–4.0)
MCH: 31.1 pg (ref 26.0–34.0)
MCHC: 32.1 g/dL (ref 30.0–36.0)
MCV: 96.9 fL (ref 78.0–100.0)
MONOS PCT: 14 %
Monocytes Absolute: 0.6 10*3/uL (ref 0.1–1.0)
NEUTROS PCT: 47 %
Neutro Abs: 2.1 10*3/uL (ref 1.7–7.7)
Platelets: 132 10*3/uL — ABNORMAL LOW (ref 150–400)
RBC: 3.18 MIL/uL — AB (ref 4.22–5.81)
RDW: 16.7 % — ABNORMAL HIGH (ref 11.5–15.5)
WBC: 4.4 10*3/uL (ref 4.0–10.5)

## 2016-11-19 MED ORDER — DARBEPOETIN ALFA 100 MCG/0.5ML IJ SOSY
80.0000 ug | PREFILLED_SYRINGE | Freq: Once | INTRAMUSCULAR | Status: AC
  Administered 2016-11-19: 80 ug via SUBCUTANEOUS

## 2016-11-19 MED ORDER — DARBEPOETIN ALFA 100 MCG/0.5ML IJ SOSY
PREFILLED_SYRINGE | INTRAMUSCULAR | Status: AC
  Filled 2016-11-19: qty 0.5

## 2016-11-19 NOTE — Progress Notes (Signed)
Stephen [name] presents today for injection per MD orders. Aranesp 35mcg administered SQ in left Abdomen. Administration without incident. Patient tolerated well.

## 2016-11-19 NOTE — Patient Instructions (Signed)
[facility] at [facility] Discharge Instructions  RECOMMENDATIONS MADE BY THE CONSULTANT AND ANY TEST RESULTS WILL BE SENT TO YOUR REFERRING PHYSICIAN.  Aranesp today.    Thank you for choosing [facility] at [facility] to provide your oncology and hematology care.  To afford each patient quality time with our provider, please arrive at least 15 minutes before your scheduled appointment time.    If you have a lab appointment with the Cancer Center please come in thru the  Main Entrance and check in at the main information desk  You need to re-schedule your appointment should you arrive 10 or more minutes late.  We strive to give you quality time with our providers, and arriving late affects you and other patients whose appointments are after yours.  Also, if you no show three or more times for appointments you may be dismissed from the clinic at the providers discretion.     Again, thank you for choosing [facility].  Our hope is that these requests will decrease the amount of time that you wait before being seen by our physicians.       _____________________________________________________________  Should you have questions after your visit to  Cancer Center, please contact our office at [phone] between the hours of 8:30 a.m. and 4:30 p.m.  Voicemails left after 4:30 p.m. will not be returned until the following business day.  For prescription refill requests, have your pharmacy contact our office.       Resources For Cancer Patients and their Caregivers ? American Cancer Society: Can assist with transportation, wigs, general needs, runs Look Good Feel Better.        1-888-227-6333 ? Cancer Care: Provides financial assistance, online support groups, medication/co-pay assistance.  1-800-813-HOPE (4673) ? [facility] Assists [place] cancer patients and their families through emotional  , educational and financial support.  336-427-4357 ? [place] DSS Where to apply for food stamps, Medicaid and utility assistance. 336-342-1394 ? RCATS: Transportation to medical appointments. 336-347-2287 ? Social Security Administration: May apply for disability if have a Stage IV cancer. 336-342-7796 1-800-772-1213 ? [place] Aging, Disability and Transit Services: Assists with nutrition, care and transit needs. 336-349-2343  Cancer Center Support Programs: @10RELATIVEDAYS@ > Cancer Support Group  2nd Tuesday of the month 1pm-2pm, Journey Room  > Creative Journey  3rd Tuesday of the month 1130am-1pm, Journey Room  > Look Good Feel Better  1st Wednesday of the month 10am-12 noon, Journey Room (Call American Cancer Society to register 1-800-395-5775)    

## 2016-11-23 ENCOUNTER — Encounter: Payer: Self-pay | Admitting: Cardiology

## 2016-11-23 NOTE — Progress Notes (Signed)
Cardiology Office Note  Date: 11/24/2016   ID: Stephen [name], DOB 11/17/1940, MRN [id number]  PCP: [name], MD  Primary Cardiologist: [name], MD   Chief Complaint  Patient presents with  . Coronary Artery Disease  . Atrial Fibrillation    History of Present Illness: Stephen [name] is a 76 y.o. male last seen in September 2017. Record review finds hospitalization in December 2017 with acute stroke, treated with tPA prior to transfer from [facility] to [facility]. He was documented to have atrial fibrillation of new onset at that time and placed on Eliquis.  He presents today for follow-up, reports no angina symptoms or palpitations. States that he has had no spontaneous bleeding problems on Eliquis. He does have chronic anemia and follows in the hematology clinic with [name]. Recent hemoglobin 9.9.  He will be seeing Dr. [name] for routine follow-up later this month.  Current cardiac regimen includes aspirin, Eliquis, Norvasc, atenolol, Lasix, Cozaar, and Pravachol. He did have an updated echocardiogram around the time of his stroke.  Past Medical History:  Diagnosis Date  . Anemia 06/01/2012   H&H of 10/30 in 8/13 with normal MCV   . Atrial fibrillation ([place])    Documented 08/2016  . B12 deficiency 08/17/2016  . Carotid artery occlusion    Right carotid endarterectomy 2001; left carotid endarterectomy in 2008  . Celiac disease   . Coronary atherosclerosis of native coronary artery    PTCA of LAD in 1994; total obstruction of the RCA treated medically; subsequent stress nuclear study with inferior ischemia  . DDD (degenerative disc disease), lumbar    Lumbar surgery in 1984  . Diabetes mellitus type II   . Essential hypertension, benign   . History of stroke   . Hyperlipidemia   . Peripheral vascular disease ([place])   . Stroke [facility])    Right brain 2001; right PCA, MCA/PCA, left SCA infarcts 08/2017 with atrial fibrillation    Past Surgical History:   Procedure Laterality Date  . APPENDECTOMY  1970  . CAROTID ENDARTERECTOMY  2008   Left   . CAROTID ENDARTERECTOMY  06/15/2000   Right  . COLONOSCOPY N/A 10/10/2015   Procedure: COLONOSCOPY;  Surgeon: Rogene Houston, MD;  Location[facility];  Service: Endoscopy;  Laterality: N/A;  12:25 - moved to 11:15 - Ann to notify  . [place] SURGERY  2000, 2001   L5 HNP required 2 surgical procedures    Current Outpatient Prescriptions  Medication Sig Dispense Refill  . acetaminophen (TYLENOL) 500 MG tablet Take 500 mg by mouth every 6 (six) hours as needed.    Marland Kitchen amLODipine (NORVASC) 5 MG tablet Take 5 mg by mouth daily.    Marland Kitchen amoxicillin-clavulanate (AUGMENTIN) 875-125 MG tablet Take 1 tablet by mouth every 12 (twelve) hours. 13 tablet 0  . apixaban (ELIQUIS) 5 MG TABS tablet Take 1 tablet (5 mg total) by mouth 2 (two) times daily. 60 tablet 5  . aspirin 81 MG tablet Take 81 mg by mouth daily.    Marland Kitchen atenolol (TENORMIN) 50 MG tablet Take 50 mg by mouth daily.      . Colchicine 0.6 MG CAPS Take 1.2 mg PO now and then 0.6 mg PO every 12 hours x 3 days 10 capsule 0  . doxazosin (CARDURA) 2 MG tablet Take 2 mg by mouth daily.    . furosemide (LASIX) 40 MG tablet Take 1 tablet (40 mg total) by mouth daily. TAKE 1 AND  1/2 TABLET (60 MG) IN THE AM AND TAKE 1 TABLET (40 MG) IN THE EVENING 225 tablet 2  . HYDROcodone-acetaminophen (NORCO/VICODIN) 5-325 MG tablet Take 1 tablet by mouth every 6 (six) hours as needed for severe pain. 8 tablet 0  . insulin aspart (NOVOLOG) 100 UNIT/ML injection Inject 25 Units into the skin 2 (two) times daily.     . insulin detemir (LEVEMIR) 100 UNIT/ML injection Inject 65 Units into the skin at bedtime.    . Lansoprazole (PREVACID PO) Take by mouth as needed.    Marland Kitchen losartan (COZAAR) 100 MG tablet     . pravastatin (PRAVACHOL) 40 MG tablet Take 40 mg by mouth daily.    . vitamin B-12 (CYANOCOBALAMIN) 1000 MCG tablet Take 2 tabs by mouth daily. 60 tablet 5   No current  facility-administered medications for this visit.    Facility-Administered Medications Ordered in Other Visits  Medication Dose Route Frequency Provider Last Rate Last Dose  . 0.9 %  sodium chloride infusion   Intravenous Continuous [facility], PA-C 10 mL/hr at 08/31/16 [id number]    . acetaminophen (TYLENOL) tablet 650 mg  650 mg Oral Once [name], MD       Allergies:  Patient has no known allergies.   Social History: The patient  reports that he quit smoking about 44 years ago. His smoking use included Cigarettes. He has a 30.00 pack-year smoking history. He has never used smokeless tobacco. He reports that he does not drink alcohol or use drugs.   ROS:  Please see the history of present illness. Otherwise, complete review of systems is positive for mild residual left-sided weakness.  All other systems are reviewed and negative.   Physical Exam: VS:  BP 102/62   Pulse 73   Ht 6' (1.829 m)   Wt 215 lb (97.5 kg)   SpO2 97%   BMI 29.16 kg/m , BMI Body mass index is 29.16 kg/m.  Wt Readings from Last 3 Encounters:  11/24/16 215 lb (97.5 kg)  11/05/16 225 lb (102.1 kg)  10/25/16 232 lb (105.2 kg)    Overweight elderly male, no distress. HEENT: Conjunctiva and lids normal, oropharynx clear.  Neck: Supple, no elevated JVP or carotid bruits, no thyromegaly.  Lungs: Clear to auscultation, nonlabored breathing at rest.  Cardiac: Irregularly irregular, no obvious S3, soft systolic murmur without diastolic murmur, no pericardial rub.  Abdomen: Soft, nontender, bowel sounds present, no guarding or rebound.  Extremities: 2+ lower leg edema, distal pulses diminished. Skin: Warm and dry. Musculoskeletal: No kyphosis. Neuropsychiatric: Alert and oriented 3, affect appropriate.  ECG: I personally reviewed the tracing from 09/04/2016 which showed rate-controlled atrial fibrillation with nonspecific T-wave abnormalities.  Recent Labwork: 09/04/2016: ALT 16; AST 17 09/08/2016:  BUN 36; Creatinine, Ser 1.58; Potassium 4.6; Sodium 138 11/19/2016: Hemoglobin 9.9; Platelets 132     Component Value Date/Time   CHOL 121 09/05/2016 0252   TRIG 254 (H) 09/05/2016 0252   HDL 37 (L) 09/05/2016 0252   CHOLHDL 3.3 09/05/2016 0252   VLDL 51 (H) 09/05/2016 0252   LDLCALC 33 09/05/2016 0252    Other Studies Reviewed Today:  Echocardiogram 09/05/2016: Study Conclusions  - Left ventricle: The cavity size was normal. Wall thickness was   normal. Systolic function was normal. The estimated ejection   fraction was in the range of 60% to 65%. Wall motion was normal;   there were no regional wall motion abnormalities. Features are   consistent with a pseudonormal left  ventricular filling pattern,   with concomitant abnormal relaxation and increased filling   pressure (grade 2 diastolic dysfunction). - Aortic valve: Mildly calcified annulus. Moderately thickened,   moderately calcified leaflets. - Mitral valve: There was mild regurgitation. - Pulmonary arteries: Systolic pressure was moderately increased.   PA peak pressure: 51 mm Hg (S).  Lexiscan Cardiolite 10/04/2013: IMPRESSION: Low risk Lexiscan Cardiolite. No diagnostic ST segment abnormalities were noted. Occasional PACs were seen without arrhythmia. Perfusion imaging is consistent with diaphragmatic attenuation, although inferior wall scar cannot be excluded in the face of a potential wall motion abnormality by gated imaging, overall LVEF 51%. No large ischemic zones are noted. LV volumes are normal.  Assessment and Plan:  1. Persistent atrial fibrillation, documented in December 2017 as outlined above. CHADSVASC score is 7. Plan to continue Eliquis for stroke prophylaxis, heart rate adequately controlled on atenolol. He is asymptomatic in terms of palpitations.  2. CAD is post previous angioplasty of the LAD with known occlusion of the RCA. He is clinically stable without active angina and underwent ischemic  testing within the last 3 years. LVEF 60-65% by updated echocardiogram. No changes made today.  3. Essential hypertension, blood pressure well controlled today.  4. Hyperlipidemia, recent LDL 33. He is on statin therapy.  Current medicines were reviewed with the patient today.  Disposition: Follow-up in 4 months.  Signed, Satira Sark, MD, [facility] 11/24/2016 9:32 AM    [employer] at [place]. [address], [place], [place] [address] Phone: [phone]; Fax: [phone]

## 2016-11-24 ENCOUNTER — Ambulatory Visit (INDEPENDENT_AMBULATORY_CARE_PROVIDER_SITE_OTHER): Payer: PPO | Admitting: Cardiology

## 2016-11-24 ENCOUNTER — Encounter: Payer: Self-pay | Admitting: Cardiology

## 2016-11-24 VITALS — BP 102/62 | HR 73 | Ht 72.0 in | Wt 215.0 lb

## 2016-11-24 DIAGNOSIS — I481 Persistent atrial fibrillation: Secondary | ICD-10-CM

## 2016-11-24 DIAGNOSIS — I2510 Atherosclerotic heart disease of native coronary artery without angina pectoris: Secondary | ICD-10-CM

## 2016-11-24 DIAGNOSIS — I10 Essential (primary) hypertension: Secondary | ICD-10-CM

## 2016-11-24 DIAGNOSIS — E782 Mixed hyperlipidemia: Secondary | ICD-10-CM | POA: Diagnosis not present

## 2016-11-24 DIAGNOSIS — I4819 Other persistent atrial fibrillation: Secondary | ICD-10-CM

## 2016-11-24 NOTE — Patient Instructions (Signed)
Your physician recommends that you schedule a follow-up appointment in:  4 months with Dr McDowell    Your physician recommends that you continue on your current medications as directed. Please refer to the Current Medication list given to you today.    If you need a refill on your cardiac medications before your next appointment, please call your pharmacy.        Thank you for choosing [facility] !              

## 2016-11-30 ENCOUNTER — Ambulatory Visit: Payer: Self-pay | Admitting: Nurse Practitioner

## 2016-12-03 ENCOUNTER — Encounter (HOSPITAL_BASED_OUTPATIENT_CLINIC_OR_DEPARTMENT_OTHER): Payer: PPO

## 2016-12-03 ENCOUNTER — Encounter (HOSPITAL_COMMUNITY): Payer: PPO

## 2016-12-03 ENCOUNTER — Encounter (HOSPITAL_COMMUNITY): Payer: Self-pay

## 2016-12-03 VITALS — BP 100/43 | HR 70 | Temp 97.6°F | Resp 18

## 2016-12-03 DIAGNOSIS — E538 Deficiency of other specified B group vitamins: Secondary | ICD-10-CM

## 2016-12-03 DIAGNOSIS — D649 Anemia, unspecified: Secondary | ICD-10-CM

## 2016-12-03 DIAGNOSIS — N183 Chronic kidney disease, stage 3 unspecified: Secondary | ICD-10-CM

## 2016-12-03 DIAGNOSIS — D631 Anemia in chronic kidney disease: Secondary | ICD-10-CM

## 2016-12-03 DIAGNOSIS — Z5189 Encounter for other specified aftercare: Secondary | ICD-10-CM | POA: Diagnosis not present

## 2016-12-03 LAB — CBC WITH DIFFERENTIAL/PLATELET
BASOS ABS: 0.1 10*3/uL (ref 0.0–0.1)
BASOS PCT: 1 %
EOS ABS: 0.2 10*3/uL (ref 0.0–0.7)
Eosinophils Relative: 4 %
HEMATOCRIT: 32.3 % — AB (ref 39.0–52.0)
HEMOGLOBIN: 10.5 g/dL — AB (ref 13.0–17.0)
Lymphocytes Relative: 31 %
Lymphs Abs: 2 10*3/uL (ref 0.7–4.0)
MCH: 31.5 pg (ref 26.0–34.0)
MCHC: 32.5 g/dL (ref 30.0–36.0)
MCV: 97 fL (ref 78.0–100.0)
Monocytes Absolute: 0.7 10*3/uL (ref 0.1–1.0)
Monocytes Relative: 10 %
NEUTROS ABS: 3.4 10*3/uL (ref 1.7–7.7)
NEUTROS PCT: 54 %
Platelets: 143 10*3/uL — ABNORMAL LOW (ref 150–400)
RBC: 3.33 MIL/uL — ABNORMAL LOW (ref 4.22–5.81)
RDW: 15.6 % — AB (ref 11.5–15.5)
WBC: 6.3 10*3/uL (ref 4.0–10.5)

## 2016-12-03 MED ORDER — CYANOCOBALAMIN 1000 MCG/ML IJ SOLN
INTRAMUSCULAR | Status: AC
  Filled 2016-12-03: qty 1

## 2016-12-03 MED ORDER — CYANOCOBALAMIN 1000 MCG/ML IJ SOLN
1000.0000 ug | Freq: Once | INTRAMUSCULAR | Status: AC
  Administered 2016-12-03: 1000 ug via INTRAMUSCULAR

## 2016-12-03 MED ORDER — DARBEPOETIN ALFA 100 MCG/0.5ML IJ SOSY
PREFILLED_SYRINGE | INTRAMUSCULAR | Status: AC
  Filled 2016-12-03: qty 0.5

## 2016-12-03 MED ORDER — DARBEPOETIN ALFA 100 MCG/0.5ML IJ SOSY
80.0000 ug | PREFILLED_SYRINGE | Freq: Once | INTRAMUSCULAR | Status: AC
  Administered 2016-12-03: 80 ug via SUBCUTANEOUS

## 2016-12-03 NOTE — Patient Instructions (Signed)
[facility] at [facility] Discharge Instructions  RECOMMENDATIONS MADE BY THE CONSULTANT AND ANY TEST RESULTS WILL BE SENT TO YOUR REFERRING PHYSICIAN.  Aranesp and B12 injections today. Return as scheduled.  Thank you for choosing [facility] at [facility] to provide your oncology and hematology care.  To afford each patient quality time with our provider, please arrive at least 15 minutes before your scheduled appointment time.    If you have a lab appointment with the Cancer Center please come in thru the  Main Entrance and check in at the main information desk  You need to re-schedule your appointment should you arrive 10 or more minutes late.  We strive to give you quality time with our providers, and arriving late affects you and other patients whose appointments are after yours.  Also, if you no show three or more times for appointments you may be dismissed from the clinic at the providers discretion.     Again, thank you for choosing [facility].  Our hope is that these requests will decrease the amount of time that you wait before being seen by our physicians.       _____________________________________________________________  Should you have questions after your visit to [facility], please contact our office at [phone] between the hours of 8:30 a.m. and 4:30 p.m.  Voicemails left after 4:30 p.m. will not be returned until the following business day.  For prescription refill requests, have your pharmacy contact our office.       Resources For Cancer Patients and their Caregivers ? American Cancer Society: Can assist with transportation, wigs, general needs, runs Look Good Feel Better.        1-888-227-6333 ? Cancer Care: Provides financial assistance, online support groups, medication/co-pay assistance.  1-800-813-HOPE (4673) ? [facility] Assists [place] cancer  patients and their families through emotional , educational and financial support.  336-427-4357 ? [place] DSS Where to apply for food stamps, Medicaid and utility assistance. 336-342-1394 ? RCATS: Transportation to medical appointments. 336-347-2287 ? Social Security Administration: May apply for disability if have a Stage IV cancer. 336-342-7796 1-800-772-1213 ? [place] [facility], Disability and Transit Services: Assists with nutrition, care and transit needs. 336-349-2343  Cancer Center Support Programs: @10RELATIVEDAYS@ > Cancer Support Group  2nd Tuesday of the month 1pm-2pm, Journey Room  > Creative Journey  3rd Tuesday of the month 1130am-1pm, Journey Room  > Look Good Feel Better  1st Wednesday of the month 10am-12 noon, Journey Room (Call [employer] to register 1-800-395-5775)   

## 2016-12-03 NOTE — Progress Notes (Signed)
Stephen [name] [facility] presents today for injection per the provider's orders.  B12 and Aranesp administrations without incident; see MAR for injection details.  Patient tolerated procedure well and without incident.  No questions or complaints noted at this time.

## 2016-12-09 DIAGNOSIS — M109 Gout, unspecified: Secondary | ICD-10-CM | POA: Diagnosis not present

## 2016-12-09 DIAGNOSIS — Z79899 Other long term (current) drug therapy: Secondary | ICD-10-CM | POA: Diagnosis not present

## 2016-12-09 DIAGNOSIS — G464 Cerebellar stroke syndrome: Secondary | ICD-10-CM | POA: Diagnosis not present

## 2016-12-09 DIAGNOSIS — E119 Type 2 diabetes mellitus without complications: Secondary | ICD-10-CM | POA: Diagnosis not present

## 2016-12-14 ENCOUNTER — Encounter: Payer: Self-pay | Admitting: Nurse Practitioner

## 2016-12-14 ENCOUNTER — Ambulatory Visit (INDEPENDENT_AMBULATORY_CARE_PROVIDER_SITE_OTHER): Payer: PPO | Admitting: Nurse Practitioner

## 2016-12-14 VITALS — BP 124/46 | HR 56 | Ht 72.0 in | Wt 220.8 lb

## 2016-12-14 DIAGNOSIS — I6340 Cerebral infarction due to embolism of unspecified cerebral artery: Secondary | ICD-10-CM | POA: Diagnosis not present

## 2016-12-14 DIAGNOSIS — I739 Peripheral vascular disease, unspecified: Secondary | ICD-10-CM

## 2016-12-14 DIAGNOSIS — I480 Paroxysmal atrial fibrillation: Secondary | ICD-10-CM | POA: Diagnosis not present

## 2016-12-14 DIAGNOSIS — I10 Essential (primary) hypertension: Secondary | ICD-10-CM | POA: Diagnosis not present

## 2016-12-14 DIAGNOSIS — E785 Hyperlipidemia, unspecified: Secondary | ICD-10-CM | POA: Diagnosis not present

## 2016-12-14 NOTE — Progress Notes (Signed)
[facility]  PATIENT: Stephen [name] DOB: Apr 17, 1941   REASON FOR VISIT: Hospital follow-up for stroke, new diagnosis of atrial fibrillation HISTORY FROM: Patient and daughter Shirlean Mylar    HISTORY OF PRESENT ILLNESS: [name], 76 year old male returns for follow-up with history of admission to the hospital December 15 with left-sided face arm and leg weakness. He also had some slurring of speech he has a history of carotid artery occlusion stroke in 2001 bilateral carotid endarterectomies cardiac disease hypertension hyperlipidemia and PVD. MRI of the brain showed right PCA territory infarct affecting the posterior medial temporal lobe, occipital lobe and right thalamus. Few small infarctions in the left cerebellum remote right MCA territory infarction., Carotid ultrasound 60-79% right ICA stenosis left 1-39%  Stenosis. Echocardiogram 60-65% EF. CTA of the neck right greater than left ICA bifurcation at hero but no significant stenosis. LDL 33 hemoglobin A1c 5.4 he received IV TPA at [facility] and was then transferred to [facility].Telemetry showed atrial fibrillation and confirmed by EKG. He was placed on eliquis. He returns to the stroke clinic today for follow-up. He remains on eliquis for secondary stroke prevention along with aspirin. He has not had further stroke or TIA symptoms. He has minimal bruising and no bleeding He lives alone but someone checks on him every day. His daughter lives in [place]. Blood pressure well controlled in the office today 124/46. His blood sugars have been well controlled. He is on pravastatin for hyperlipidemia and he denies muscle aches. He received rehabilitation for 6 weeks in the home after he was discharged. He exercises intermittently and does his home exercise program. He returns for reevaluation   REVIEW OF SYSTEMS: Full 14 system review of systems performed and notable only for those listed, all others are neg:  Constitutional:  neg  Cardiovascular: neg Ear/Nose/Throat: neg  Skin: neg Eyes: neg Respiratory: Cough Gastroitestinal: neg  Hematology/Lymphatic: neg  Endocrine: neg Musculoskeletal:neg Allergy/Immunology: neg Neurological: neg Psychiatric: neg Sleep : neg   ALLERGIES: No Known Allergies  HOME MEDICATIONS: Outpatient Medications Prior to Visit  Medication Sig Dispense Refill  . acetaminophen (TYLENOL) 500 MG tablet Take 500 mg by mouth every 6 (six) hours as needed.    Marland Kitchen amLODipine (NORVASC) 5 MG tablet Take 5 mg by mouth daily.    Marland Kitchen amoxicillin-clavulanate (AUGMENTIN) 875-125 MG tablet Take 1 tablet by mouth every 12 (twelve) hours. 13 tablet 0  . apixaban (ELIQUIS) 5 MG TABS tablet Take 1 tablet (5 mg total) by mouth 2 (two) times daily. 60 tablet 5  . aspirin 81 MG tablet Take 81 mg by mouth daily.    Marland Kitchen atenolol (TENORMIN) 50 MG tablet Take 50 mg by mouth daily.      . Colchicine 0.6 MG CAPS Take 1.2 mg PO now and then 0.6 mg PO every 12 hours x 3 days 10 capsule 0  . doxazosin (CARDURA) 2 MG tablet Take 2 mg by mouth daily.    . furosemide (LASIX) 40 MG tablet Take 1 tablet (40 mg total) by mouth daily. TAKE 1 AND 1/2 TABLET (60 MG) IN THE AM AND TAKE 1 TABLET (40 MG) IN THE EVENING 225 tablet 2  . HYDROcodone-acetaminophen (NORCO/VICODIN) 5-325 MG tablet Take 1 tablet by mouth every 6 (six) hours as needed for severe pain. 8 tablet 0  . insulin aspart (NOVOLOG) 100 UNIT/ML injection Inject 25 Units into the skin 2 (two) times daily.     . insulin detemir (LEVEMIR) 100 UNIT/ML injection Inject 65  Units into the skin at bedtime.    . Lansoprazole (PREVACID PO) Take by mouth as needed.    Marland Kitchen losartan (COZAAR) 100 MG tablet     . pravastatin (PRAVACHOL) 40 MG tablet Take 40 mg by mouth daily.    . vitamin B-12 (CYANOCOBALAMIN) 1000 MCG tablet Take 2 tabs by mouth daily. 60 tablet 5   Facility-Administered Medications Prior to Visit  Medication Dose Route Frequency Provider Last Rate Last Dose   . 0.9 %  sodium chloride infusion   Intravenous Continuous [facility], PA-C 10 mL/hr at 08/31/16 3557    . acetaminophen (TYLENOL) tablet 650 mg  650 mg Oral Once [name], MD        PAST MEDICAL HISTORY: Past Medical History:  Diagnosis Date  . Anemia 06/01/2012   H&H of 10/30 in 8/13 with normal MCV   . Atrial fibrillation ([place])    Documented 08/2016  . B12 deficiency 08/17/2016  . Carotid artery occlusion    Right carotid endarterectomy 2001; left carotid endarterectomy in 2008  . Celiac disease   . Coronary atherosclerosis of native coronary artery    PTCA of LAD in 1994; total obstruction of the RCA treated medically; subsequent stress nuclear study with inferior ischemia  . DDD (degenerative disc disease), lumbar    Lumbar surgery in 1984  . Diabetes mellitus type II   . Essential hypertension, benign   . History of stroke   . Hyperlipidemia   . Peripheral vascular disease ([place])   . Stroke [facility])    Right brain 2001; right PCA, MCA/PCA, left SCA infarcts 08/2017 with atrial fibrillation    PAST SURGICAL HISTORY: Past Surgical History:  Procedure Laterality Date  . APPENDECTOMY  1970  . CAROTID ENDARTERECTOMY  2008   Left   . CAROTID ENDARTERECTOMY  06/15/2000   Right  . COLONOSCOPY N/A 10/10/2015   Procedure: COLONOSCOPY;  Surgeon: Rogene Houston, MD;  Location: [facility]ITE;  Service: Endoscopy;  Laterality: N/A;  12:25 - moved to 11:15 - Ann to notify  . [place] SURGERY  2000, 2001   L5 HNP required 2 surgical procedures    FAMILY HISTORY: Family History  Problem Relation Age of Onset  . Heart disease Mother     Before age 28  . Diabetes Mother   . Heart attack Mother   . Diabetes Sister   . Heart disease Brother   . Diabetes Brother   . Diabetes Daughter   . Diabetes Son   . Hyperlipidemia Son     SOCIAL HISTORY: Social History   Social History  . Marital status: Widowed    Spouse name: N/A  . Number of children: N/A  .  Years of education: N/A   Occupational History  . Not on file.   Social History Main Topics  . Smoking status: Former Smoker    Packs/day: 3.00    Years: 10.00    Types: Cigarettes    Quit date: 09/21/1972  . Smokeless tobacco: Never Used  . Alcohol use No  . Drug use: No  . Sexual activity: Yes    Partners: Male    Birth control/ protection: Post-menopausal   Other Topics Concern  . Not on file   Social History Narrative  . No narrative on file     PHYSICAL EXAM  Vitals:   12/14/16 1435  BP: (!) 124/46  Pulse: (!) 56  Weight: 220 lb 12.8 oz (100.2 kg)  Height: 6' (1.829  m)   Body mass index is 29.95 kg/m.  Generalized: Well developed, in no acute distress  Head: normocephalic and atraumatic,. Oropharynx benign  Neck: Supple, no carotid bruits  Cardiac: Irregularly irregular rate,   Musculoskeletal: No deformity   Neurological examination   Mentation: Alert oriented to time, place, history taking. Attention span and concentration appropriate. Recent and remote memory intact.  Follows all commands speech and language fluent.   Cranial nerve II-XII: .Pupils were equal round reactive to light extraocular movements were full, visual field were full on confrontational test. Facial sensation and strength were normal. hearing was intact to finger rubbing bilaterally. Uvula tongue midline. head turning and shoulder shrug were normal and symmetric.Tongue protrusion into cheek strength was normal. Motor: normal bulk and tone, full strength in the BUE, BLE, fine finger movements normal, no pronator drift.  Sensory: normal and symmetric to light touch, pinprick, and  Vibration,  in the upper and lower extremities   Coordination: finger-nose-finger, heel-to-shin bilaterally, no dysmetria, no tremor Reflexes: 1+ upper lower and symmetric, plantar responses were flexor bilaterally. Gait and Station: Rising up from seated position without assistance, normal stance,  moderate  stride, good arm swing, smooth turning, able to perform tiptoe, and heel walking without difficulty. Tandem gait is mildly unsteady. No assistive device  DIAGNOSTIC DATA (LABS, IMAGING, TESTING) - I reviewed patient records, labs, notes, testing and imaging myself where available.  Lab Results  Component Value Date   WBC 6.3 12/03/2016   HGB 10.5 (L) 12/03/2016   HCT 32.3 (L) 12/03/2016   MCV 97.0 12/03/2016   PLT 143 (L) 12/03/2016      Component Value Date/Time   NA 138 09/08/2016 0600   K 4.6 09/08/2016 0600   CL 103 09/08/2016 0600   CO2 25 09/08/2016 0600   GLUCOSE 141 (H) 09/08/2016 0600   BUN 36 (H) 09/08/2016 0600   CREATININE 1.58 (H) 09/08/2016 0600   CALCIUM 9.1 09/08/2016 0600   PROT 7.7 09/04/2016 1755   ALBUMIN 3.5 09/04/2016 1755   AST 17 09/04/2016 1755   ALT 16 (L) 09/04/2016 1755   ALKPHOS 61 09/04/2016 1755   BILITOT 0.3 09/04/2016 1755   GFRNONAA 41 (L) 09/08/2016 0600   GFRAA 48 (L) 09/08/2016 0600   Lab Results  Component Value Date   CHOL 121 09/05/2016   HDL 37 (L) 09/05/2016   LDLCALC 33 09/05/2016   TRIG 254 (H) 09/05/2016   CHOLHDL 3.3 09/05/2016   Lab Results  Component Value Date   HGBA1C 5.4 09/05/2016   Lab Results  Component Value Date   VITAMINB12 176 (L) 08/11/2016   ASSESSMENT AND PLAN  76 y.o. year old male  here for hospital follow-up after  admission  for right PCA territory infarct affecting the posterior medial temporal lobe, occipital lobe and right thalamus. Few small infarctions in the left cerebellum remote right MCA territory infarction. New diagnosis of atrial fibrillation Carotid artery occlusion; Celiac disease; Coronary atherosclerosis of native coronary artery;  Diabetes mellitus type II; Essential hypertension, benign; History of stroke; Hyperlipidemia; Peripheral vascular disease ([place]);The patient is a current patient of Dr. Erlinda Hong  who is out of the office today . This note is sent to the work in doctor.     PLAN:  Stressed the importance of management of risk factors to prevent further stroke Continue Eliquis and aspirin for secondary stroke prevention and atrial fibrillation Maintain strict control of hypertension with blood pressure goal below 130/90, today's reading 124/46,  continue antihypertensive  medications Control of diabetes with hemoglobin A1c below 6.5 followed by primary care most recent hemoglobin A1c 5.4  continue diabetic medications Cholesterol with LDL cholesterol less than 70, followed by primary care,  most recent 33 continue statin drugs pravastatin Exercise by walking, slowly increase , eat healthy diet with whole grains,  fresh fruits and vegetables Discussed risk for recurrent stroke/ TIA and answered additional questions Follow-up in 3 months This was a visit requiring 30 minutes and medical decision making of high complexity with extensive review of history, hospital chart, counseling and answering questions Dennie Bible, [facility], [facility], APRN  [facility] [address] [place], [place] [address] [phone]

## 2016-12-14 NOTE — Progress Notes (Signed)
I have read the note, and I agree with the clinical assessment and plan.  Berenice Oehlert KEITH   

## 2016-12-14 NOTE — Patient Instructions (Signed)
Stressed the importance of management of risk factors to prevent further stroke Continue Eliquis for secondary stroke prevention and atrial fibrillation Maintain strict control of hypertension with blood pressure goal below 130/90, today's reading 124/46,  continue antihypertensive medications Control of diabetes with hemoglobin A1c below 6.5 followed by primary care most recent hemoglobin A1c 5.4  continue diabetic medications Cholesterol with LDL cholesterol less than 70, followed by primary care,  most recent 33 continue statin drugs pravastatin Exercise by walking, slowly increase , eat healthy diet with whole grains,  fresh fruits and vegetables Discussed risk for recurrent stroke/ TIA and answered additional questions Follow-up in 3 months

## 2016-12-16 DIAGNOSIS — N184 Chronic kidney disease, stage 4 (severe): Secondary | ICD-10-CM | POA: Diagnosis not present

## 2016-12-16 DIAGNOSIS — E1122 Type 2 diabetes mellitus with diabetic chronic kidney disease: Secondary | ICD-10-CM | POA: Diagnosis not present

## 2016-12-16 DIAGNOSIS — I482 Chronic atrial fibrillation: Secondary | ICD-10-CM | POA: Diagnosis not present

## 2016-12-17 ENCOUNTER — Encounter (HOSPITAL_BASED_OUTPATIENT_CLINIC_OR_DEPARTMENT_OTHER): Payer: PPO

## 2016-12-17 ENCOUNTER — Encounter (HOSPITAL_COMMUNITY): Payer: PPO

## 2016-12-17 VITALS — BP 108/30 | HR 53 | Temp 97.9°F | Resp 20

## 2016-12-17 DIAGNOSIS — N183 Chronic kidney disease, stage 3 unspecified: Secondary | ICD-10-CM

## 2016-12-17 DIAGNOSIS — Z5189 Encounter for other specified aftercare: Secondary | ICD-10-CM | POA: Diagnosis not present

## 2016-12-17 DIAGNOSIS — E538 Deficiency of other specified B group vitamins: Secondary | ICD-10-CM

## 2016-12-17 DIAGNOSIS — D631 Anemia in chronic kidney disease: Secondary | ICD-10-CM | POA: Diagnosis not present

## 2016-12-17 LAB — CBC WITH DIFFERENTIAL/PLATELET
Basophils Absolute: 0.1 10*3/uL (ref 0.0–0.1)
Basophils Relative: 1 %
EOS ABS: 0.3 10*3/uL (ref 0.0–0.7)
EOS PCT: 5 %
HCT: 32.1 % — ABNORMAL LOW (ref 39.0–52.0)
Hemoglobin: 10.3 g/dL — ABNORMAL LOW (ref 13.0–17.0)
LYMPHS ABS: 1.8 10*3/uL (ref 0.7–4.0)
Lymphocytes Relative: 32 %
MCH: 30.8 pg (ref 26.0–34.0)
MCHC: 32.1 g/dL (ref 30.0–36.0)
MCV: 96.1 fL (ref 78.0–100.0)
Monocytes Absolute: 0.6 10*3/uL (ref 0.1–1.0)
Monocytes Relative: 11 %
Neutro Abs: 2.9 10*3/uL (ref 1.7–7.7)
Neutrophils Relative %: 51 %
PLATELETS: 150 10*3/uL (ref 150–400)
RBC: 3.34 MIL/uL — AB (ref 4.22–5.81)
RDW: 15.4 % (ref 11.5–15.5)
WBC: 5.7 10*3/uL (ref 4.0–10.5)

## 2016-12-17 MED ORDER — DARBEPOETIN ALFA 100 MCG/0.5ML IJ SOSY
80.0000 ug | PREFILLED_SYRINGE | Freq: Once | INTRAMUSCULAR | Status: AC
  Administered 2016-12-17: 80 ug via SUBCUTANEOUS
  Filled 2016-12-17: qty 0.5

## 2016-12-17 NOTE — Progress Notes (Signed)
Stephen [name] presents today for injection per MD orders. Aranesp 75mcg administered SQ in left Abdomen. Administration without incident. Patient tolerated well.   Bruising noted to abdomen. Patient states it is probably from his insulin injections. Pt also receiving Aranesp every 2 weeks. On Eliquis but states he is not having any bleeding from nose, gums, bowels, bladder.

## 2016-12-18 ENCOUNTER — Other Ambulatory Visit: Payer: Self-pay

## 2016-12-18 NOTE — Patient Outreach (Signed)
Telephone outreach to patient to obtain mRS was successfully completed. mRS = 0 

## 2016-12-31 ENCOUNTER — Encounter (HOSPITAL_COMMUNITY): Payer: Self-pay | Admitting: Oncology

## 2016-12-31 ENCOUNTER — Encounter (HOSPITAL_BASED_OUTPATIENT_CLINIC_OR_DEPARTMENT_OTHER): Payer: PPO

## 2016-12-31 ENCOUNTER — Encounter (HOSPITAL_COMMUNITY): Payer: PPO | Attending: Oncology | Admitting: Oncology

## 2016-12-31 ENCOUNTER — Encounter (HOSPITAL_COMMUNITY): Payer: PPO

## 2016-12-31 VITALS — BP 99/33 | HR 70 | Temp 97.7°F | Resp 16 | Ht 72.0 in | Wt 216.7 lb

## 2016-12-31 DIAGNOSIS — D631 Anemia in chronic kidney disease: Secondary | ICD-10-CM

## 2016-12-31 DIAGNOSIS — Z79899 Other long term (current) drug therapy: Secondary | ICD-10-CM | POA: Insufficient documentation

## 2016-12-31 DIAGNOSIS — D508 Other iron deficiency anemias: Secondary | ICD-10-CM | POA: Diagnosis not present

## 2016-12-31 DIAGNOSIS — M25471 Effusion, right ankle: Secondary | ICD-10-CM

## 2016-12-31 DIAGNOSIS — D509 Iron deficiency anemia, unspecified: Secondary | ICD-10-CM

## 2016-12-31 DIAGNOSIS — I129 Hypertensive chronic kidney disease with stage 1 through stage 4 chronic kidney disease, or unspecified chronic kidney disease: Secondary | ICD-10-CM | POA: Diagnosis not present

## 2016-12-31 DIAGNOSIS — N183 Chronic kidney disease, stage 3 unspecified: Secondary | ICD-10-CM

## 2016-12-31 DIAGNOSIS — Z794 Long term (current) use of insulin: Secondary | ICD-10-CM | POA: Diagnosis not present

## 2016-12-31 DIAGNOSIS — I4891 Unspecified atrial fibrillation: Secondary | ICD-10-CM | POA: Diagnosis not present

## 2016-12-31 DIAGNOSIS — E538 Deficiency of other specified B group vitamins: Secondary | ICD-10-CM

## 2016-12-31 DIAGNOSIS — E1122 Type 2 diabetes mellitus with diabetic chronic kidney disease: Secondary | ICD-10-CM | POA: Diagnosis not present

## 2016-12-31 DIAGNOSIS — M25472 Effusion, left ankle: Secondary | ICD-10-CM

## 2016-12-31 HISTORY — DX: Iron deficiency anemia, unspecified: D50.9

## 2016-12-31 LAB — COMPREHENSIVE METABOLIC PANEL
ALT: 15 U/L — ABNORMAL LOW (ref 17–63)
ANION GAP: 9 (ref 5–15)
AST: 18 U/L (ref 15–41)
Albumin: 3.9 g/dL (ref 3.5–5.0)
Alkaline Phosphatase: 66 U/L (ref 38–126)
BUN: 68 mg/dL — ABNORMAL HIGH (ref 6–20)
CHLORIDE: 104 mmol/L (ref 101–111)
CO2: 24 mmol/L (ref 22–32)
Calcium: 9.1 mg/dL (ref 8.9–10.3)
Creatinine, Ser: 2.47 mg/dL — ABNORMAL HIGH (ref 0.61–1.24)
GFR calc non Af Amer: 24 mL/min — ABNORMAL LOW (ref >60)
GFR, EST AFRICAN AMERICAN: 28 mL/min — AB (ref >60)
Glucose, Bld: 145 mg/dL — ABNORMAL HIGH (ref 65–99)
POTASSIUM: 4.4 mmol/L (ref 3.5–5.1)
SODIUM: 137 mmol/L (ref 135–145)
Total Bilirubin: 0.4 mg/dL (ref 0.3–1.2)
Total Protein: 7.7 g/dL (ref 6.5–8.1)

## 2016-12-31 LAB — IRON AND TIBC
IRON: 36 ug/dL — AB (ref 45–182)
SATURATION RATIOS: 11 % — AB (ref 17.9–39.5)
TIBC: 322 ug/dL (ref 250–450)
UIBC: 286 ug/dL

## 2016-12-31 LAB — CBC WITH DIFFERENTIAL/PLATELET
Basophils Absolute: 0.1 10*3/uL (ref 0.0–0.1)
Basophils Relative: 1 %
EOS ABS: 0.3 10*3/uL (ref 0.0–0.7)
EOS PCT: 6 %
HCT: 32.7 % — ABNORMAL LOW (ref 39.0–52.0)
Hemoglobin: 10.5 g/dL — ABNORMAL LOW (ref 13.0–17.0)
LYMPHS ABS: 1.7 10*3/uL (ref 0.7–4.0)
Lymphocytes Relative: 33 %
MCH: 30.8 pg (ref 26.0–34.0)
MCHC: 32.1 g/dL (ref 30.0–36.0)
MCV: 95.9 fL (ref 78.0–100.0)
MONO ABS: 0.6 10*3/uL (ref 0.1–1.0)
Monocytes Relative: 12 %
Neutro Abs: 2.5 10*3/uL (ref 1.7–7.7)
Neutrophils Relative %: 48 %
PLATELETS: 138 10*3/uL — AB (ref 150–400)
RBC: 3.41 MIL/uL — AB (ref 4.22–5.81)
RDW: 15.1 % (ref 11.5–15.5)
WBC: 5.1 10*3/uL (ref 4.0–10.5)

## 2016-12-31 LAB — FERRITIN: FERRITIN: 147 ng/mL (ref 24–336)

## 2016-12-31 LAB — VITAMIN B12: VITAMIN B 12: 1481 pg/mL — AB (ref 180–914)

## 2016-12-31 MED ORDER — DARBEPOETIN ALFA 100 MCG/0.5ML IJ SOSY
80.0000 ug | PREFILLED_SYRINGE | Freq: Once | INTRAMUSCULAR | Status: AC
  Administered 2016-12-31: 80 ug via SUBCUTANEOUS

## 2016-12-31 MED ORDER — DARBEPOETIN ALFA 100 MCG/0.5ML IJ SOSY
PREFILLED_SYRINGE | INTRAMUSCULAR | Status: AC
  Filled 2016-12-31: qty 0.5

## 2016-12-31 NOTE — Assessment & Plan Note (Signed)
Iron deficiency with intolerance to PO iron therefore requiring IV iron replacement therapy.  Labs today: CBC, iron/TIBC, ferritin.  Labs in 12 weeks: CBC, iron/TIBC, ferritin.

## 2016-12-31 NOTE — Progress Notes (Unsigned)
Faxed referral/pt records to Dr Theador Hawthorne.

## 2016-12-31 NOTE — Assessment & Plan Note (Signed)
Chronic renal disease, stage III.

## 2016-12-31 NOTE — Assessment & Plan Note (Addendum)
Anemia of chronic renal disease, with Stage III chronic renal disease secondary to HTN/DM- insulin dependent (well controlled).  Colonoscopy performed by Dr. Laural Golden in Jan 2017.  Recent right PCA acute infarction requiring tPA on 09/04/2016 at 1831 hours requiring transfer to [facility] hospital and hospitalization from 09/04/2016- 09/08/2016.  Cause of embolic stroke from newly diagnosed A-fib.  Now anticoagulated with Eliquis and ASA for stroke and cardiac prevention.  On ESA therapy, Aranesp 80 mcg every 2 weeks.  On initial work-up kappa/lambda ratio was elevated.  Likely from chronic renal disease, but will need to follow moving forward.  Labs today: CBC, BMET.  I personally reviewed and went over laboratory results with the patient.  The results are noted within this dictation.   Labs every 2 weeks: CBC Labs in 12 weeks: BMET, SPEP+IFE, light chain assay, IgG, IgA, IgM.  He reports that he does not see nephrology.  Referral will be made to nephrologist today.  Problem list reviewed with patient and edited accordingly.  Medications are reviewed with the patient and edited accordingly.  Return in 12 weeks for follow-up and evaluation of response to treatment.  More than 50% of the time spent with the patient was utilized for counseling and coordination of care.

## 2016-12-31 NOTE — Patient Instructions (Signed)
[place] at [facility] Discharge Instructions  RECOMMENDATIONS MADE BY THE CONSULTANT AND ANY TEST RESULTS WILL BE SENT TO YOUR REFERRING PHYSICIAN.  You were seen today by [name] PA-C. Aranesp injection today. Continue Aranesp injections every 2 weeks with labs. Return in 12 weeks for follow up.   Thank you for choosing [place] at [facility] to provide your oncology and hematology care.  To afford each patient quality time with our provider, please arrive at least 15 minutes before your scheduled appointment time.    If you have a lab appointment with the [place] please come in thru the  Main Entrance and check in at the main information desk  You need to re-schedule your appointment should you arrive 10 or more minutes late.  We strive to give you quality time with our providers, and arriving late affects you and other patients whose appointments are after yours.  Also, if you no show three or more times for appointments you may be dismissed from the clinic at the providers discretion.     Again, thank you for choosing [facility].  Our hope is that these requests will decrease the amount of time that you wait before being seen by our physicians.       _____________________________________________________________  Should you have questions after your visit to [facility], please contact our office at [phone] between the hours of 8:30 a.m. and 4:30 p.m.  Voicemails left after 4:30 p.m. will not be returned until the following business day.  For prescription refill requests, have your pharmacy contact our office.       Resources For Cancer Patients and their Caregivers ? American Cancer Society: Can assist with transportation, wigs, general needs, runs Look Good Feel Better.        864-222-3578 ? Cancer Care: Provides financial assistance, online support groups, medication/co-pay  assistance.  1-800-813-HOPE 6048595710) ? [facility] Assists [place] cancer patients and their families through emotional , educational and financial support.  (856)536-6961 ? [place] DSS Where to apply for food stamps, Medicaid and utility assistance. 319-415-5439 ? RCATS: Transportation to medical appointments. 256-700-7541 ? Social Security Administration: May apply for disability if have a Stage IV cancer. 484-361-3603 8701024061 ? [employer], Disability and Transit Services: Assists with nutrition, care and transit needs. [place] Support Programs: @10RELATIVEDAYS @ > Cancer Support Group  2nd Tuesday of the month 1pm-2pm, Journey Room  > Creative Journey  3rd Tuesday of the month 1130am-1pm, Journey Room  > Look Good Feel Better  1st Wednesday of the month 10am-12 noon, Journey Room (Call [place] to register [phone])

## 2016-12-31 NOTE — Progress Notes (Signed)
Stephen Noble, MD [address] [place] [address]  Anemia of chronic renal failure, stage 3 (moderate) - Plan: CBC, Ferritin, Iron and TIBC, Vitamin A07, Basic metabolic panel, CBC, Kappa/lambda light chains, IgG, IgA, IgM, Immunofixation electrophoresis, Protein electrophoresis, serum, CANCELED: CBC  B12 deficiency - Plan: Vitamin B12  Other iron deficiency anemia  Chronic renal disease, stage 3, moderately decreased glomerular filtration rate between 30-59 mL/min/1.73 square meter - Plan: Kappa/lambda light chains, IgG, IgA, IgM, Immunofixation electrophoresis, Protein electrophoresis, serum  CURRENT THERAPY: Aranesp 80 mcg every 2 weeks beginning on 10/08/2016.  INTERVAL HISTORY: Stephen [name] 76 y.o. male returns for followup of anemia of chronic renal disease, Stage III  AND B12 deficiency, requiring B12 IM injections in past and now on PO B12 2000 mcg every day.  He reports bilateral feet and ankle swelling.  He is on Lasix and reports being compliant with this.  He denies any discomfort of his legs due to swelling.  He admits to feeling much improved with Aranesp therapy since his hemoglobin is up 1 g since starting therapy.  He notes an increase strength in his legs as well.  "My getting treated for cancer?"  I provided the patient education regarding anemia chronic renal disease.  He denies seeing nephrology.  He reports being compliant with 2000 mcg of B12 daily.  Denies any issues with this medication.  Review of Systems  Constitutional: Negative.  Negative for chills, fever and weight loss.  HENT: Negative.   Eyes: Negative.   Respiratory: Negative.  Negative for cough and shortness of breath.   Cardiovascular: Positive for leg swelling (bilateral). Negative for chest pain.  Gastrointestinal: Negative.  Negative for blood in stool, constipation, diarrhea, melena, nausea and vomiting.  Genitourinary: Negative.   Musculoskeletal: Negative.   Skin:  Negative.   Neurological: Negative.  Negative for weakness.  Endo/Heme/Allergies: Negative.  Does not bruise/bleed easily.  Psychiatric/Behavioral: Negative.     Past Medical History:  Diagnosis Date  . Anemia 06/01/2012   H&H of 10/30 in 8/13 with normal MCV   . Anemia of chronic renal failure, stage 3 (moderate) 06/01/2012   H&H of 10/30 in 8/13 with normal MCV  . Atrial fibrillation ([place])    Documented 08/2016  . B12 deficiency 08/17/2016  . Carotid artery occlusion    Right carotid endarterectomy 2001; left carotid endarterectomy in 2008  . Celiac disease   . Chronic renal disease, stage 3, moderately decreased glomerular filtration rate between 30-59 mL/min/1.73 square meter 06/01/2012  . Coronary atherosclerosis of native coronary artery    PTCA of LAD in 1994; total obstruction of the RCA treated medically; subsequent stress nuclear study with inferior ischemia  . DDD (degenerative disc disease), lumbar    Lumbar surgery in 1984  . Diabetes mellitus type II   . Essential hypertension, benign   . History of stroke   . Hyperlipidemia   . Iron deficiency anemia 12/31/2016  . Peripheral vascular disease ([place])   . Stroke [facility])    Right brain 2001; right PCA, MCA/PCA, left SCA infarcts 08/2017 with atrial fibrillation    Past Surgical History:  Procedure Laterality Date  . APPENDECTOMY  1970  . CAROTID ENDARTERECTOMY  2008   Left   . CAROTID ENDARTERECTOMY  06/15/2000   Right  . COLONOSCOPY N/A 10/10/2015   Procedure: COLONOSCOPY;  Surgeon: Rogene Houston, MD;  Location: [facility] SUITE;  Service: Endoscopy;  Laterality: N/A;  12:25 - moved  to 11:15 - Ann to notify  . [place] SURGERY  2000, 2001   L5 HNP required 2 surgical procedures    Family History  Problem Relation Age of Onset  . Heart disease Mother     Before age 66  . Diabetes Mother   . Heart attack Mother   . Diabetes Sister   . Heart disease Brother   . Diabetes Brother   . Diabetes Daughter   .  Diabetes Son   . Hyperlipidemia Son     Social History   Social History  . Marital status: Widowed    Spouse name: N/A  . Number of children: N/A  . Years of education: N/A   Social History Main Topics  . Smoking status: Former Smoker    Packs/day: 3.00    Years: 10.00    Types: Cigarettes    Quit date: 09/21/1972  . Smokeless tobacco: Never Used  . Alcohol use No  . Drug use: No  . Sexual activity: Yes    Partners: Male    Birth control/ protection: Post-menopausal   Other Topics Concern  . None   Social History Narrative  . None     PHYSICAL EXAMINATION  ECOG PERFORMANCE STATUS: 1 - Symptomatic but completely ambulatory  Vitals:   12/31/16 1211  BP: (!) 99/33  Pulse: 70  Resp: 16  Temp: 97.7 F (36.5 C)    GENERAL:alert, no distress, well nourished, well developed, comfortable, cooperative, smiling and unaccompanied  SKIN: skin color, texture, turgor are normal, no rashes or significant lesions HEAD: Normocephalic, No masses, lesions, tenderness or abnormalities EYES: normal, EOMI EARS: External ears normal OROPHARYNX:lips, buccal mucosa, and tongue normal and mucous membranes are moist  NECK: supple, trachea midline LYMPH:  no palpable lymphadenopathy BREAST:not examined LUNGS: clear to auscultation  HEART: regular rate & rhythm ABDOMEN:abdomen soft, non-tender and normal bowel sounds BACK: Back symmetric, no curvature. EXTREMITIES:less then 2 second capillary refill, no joint deformities, effusion, or inflammation, no skin discoloration, positive findings:  edema B/L 1-2+ pitting edema in ankles and feet bilaterally.  NEURO: alert & oriented x 3 with fluent speech, no focal motor/sensory deficits, gait normal   LABORATORY DATA: CBC    Component Value Date/Time   WBC 5.1 12/31/2016 0826   RBC 3.41 (L) 12/31/2016 0826   HGB 10.5 (L) 12/31/2016 0826   HCT 32.7 (L) 12/31/2016 0826   PLT 138 (L) 12/31/2016 0826   MCV 95.9 12/31/2016 0826   MCH 30.8  12/31/2016 0826   MCHC 32.1 12/31/2016 0826   RDW 15.1 12/31/2016 0826   LYMPHSABS 1.7 12/31/2016 0826   MONOABS 0.6 12/31/2016 0826   EOSABS 0.3 12/31/2016 0826   BASOSABS 0.1 12/31/2016 0826      Chemistry      Component Value Date/Time   NA 137 12/31/2016 0826   K 4.4 12/31/2016 0826   CL 104 12/31/2016 0826   CO2 24 12/31/2016 0826   BUN 68 (H) 12/31/2016 0826   CREATININE 2.47 (H) 12/31/2016 0826      Component Value Date/Time   CALCIUM 9.1 12/31/2016 0826   ALKPHOS 66 12/31/2016 0826   AST 18 12/31/2016 0826   ALT 15 (L) 12/31/2016 0826   BILITOT 0.4 12/31/2016 0826      Lab Results  Component Value Date   VITAMINB12 1,481 (H) 12/31/2016   Lab Results  Component Value Date   PROT 7.7 12/31/2016   ALBUMINELP 3.3 08/11/2016   A1GS 0.3 08/11/2016   A2GS  1.2 (H) 08/11/2016   BETS 1.0 08/11/2016   GAMS 1.3 08/11/2016   MSPIKE Not Observed 08/11/2016   SPEI Comment 08/11/2016   SPECOM Comment 08/11/2016   IGGSERUM 1,283 08/11/2016   IGGSERUM 1,272 08/11/2016   IGA 189 08/11/2016   IGA 194 08/11/2016   IGMSERUM 37 08/11/2016   IGMSERUM 38 08/11/2016   KPAFRELGTCHN 56.8 (H) 08/11/2016   LAMBDASER 25.5 08/11/2016   KAPLAMBRATIO 2.23 (H) 08/11/2016    PENDING LABS:   RADIOGRAPHIC STUDIES:  No results found.   PATHOLOGY:    ASSESSMENT AND PLAN:  Anemia of chronic renal failure, stage 3 (moderate) Anemia of chronic renal disease, with Stage III chronic renal disease secondary to HTN/DM- insulin dependent (well controlled).  Colonoscopy performed by Dr. Laural Golden in Jan 2017.  Recent right PCA acute infarction requiring tPA on 09/04/2016 at 1831 hours requiring transfer to [facility] hospital and hospitalization from 09/04/2016- 09/08/2016.  Cause of embolic stroke from newly diagnosed A-fib.  Now anticoagulated with Eliquis and ASA for stroke and cardiac prevention.  On ESA therapy, Aranesp 80 mcg every 2 weeks.  On initial work-up kappa/lambda ratio was  elevated.  Likely from chronic renal disease, but will need to follow moving forward.  Labs today: CBC, BMET.  I personally reviewed and went over laboratory results with the patient.  The results are noted within this dictation.   Labs every 2 weeks: CBC Labs in 12 weeks: BMET, SPEP+IFE, light chain assay, IgG, IgA, IgM.  He reports that he does not see nephrology.  Referral will be made to nephrologist today.  Problem list reviewed with patient and edited accordingly.  Medications are reviewed with the patient and edited accordingly.  Return in 12 weeks for follow-up and evaluation of response to treatment.  More than 50% of the time spent with the patient was utilized for counseling and coordination of care.  Iron deficiency anemia Iron deficiency with intolerance to PO iron therefore requiring IV iron replacement therapy.  Labs today: CBC, iron/TIBC, ferritin.  Labs in 12 weeks: CBC, iron/TIBC, ferritin.  Chronic renal disease, stage 3, moderately decreased glomerular filtration rate between 30-59 mL/min/1.73 square meter Chronic renal disease, stage III.    B12 deficiency B12 deficiency, requiring 1000 mcg IM injections in the past.  Now on 2000 mcg PO daily.  B12 supportive therapy plan deleted.  Labs today: B12  Labs in 12 weeks: B12   ORDERS PLACED FOR THIS ENCOUNTER: Orders Placed This Encounter  Procedures  . CBC  . Ferritin  . Iron and TIBC  . Vitamin B12  . Basic metabolic panel  . CBC  . Kappa/lambda light chains  . IgG, IgA, IgM  . Immunofixation electrophoresis  . Protein electrophoresis, serum    MEDICATIONS PRESCRIBED THIS ENCOUNTER: Meds ordered this encounter  Medications  . allopurinol (ZYLOPRIM) 100 MG tablet    THERAPY PLAN:  Aranesp every 2 weeks.  Will adjust the dose according to response to therapy.  All questions were answered. The patient knows to call the clinic with any problems, questions or concerns. We can certainly see the  patient much sooner if necessary.  Patient and plan discussed with Dr. Twana First and she is in agreement with the aforementioned.   This note is electronically signed by: Doy Mince 12/31/2016 2:39 PM

## 2016-12-31 NOTE — Progress Notes (Signed)
Les Pou presents today for injection per MD orders. Aranesp 80 mcg administered SQ in left Abdomen. Administration without incident. Patient tolerated well.   Per T.Kefal[place],PA patient is no longer getting B12 injections.

## 2016-12-31 NOTE — Progress Notes (Signed)
Rn sent sign prescription assistance forms sign and dated by Dr. Rosalin Hawking for patient. The forms are from [employer] to help pt with eliquis. Forms were put in the envelope provided.

## 2016-12-31 NOTE — Assessment & Plan Note (Addendum)
B12 deficiency, requiring 1000 mcg IM injections in the past.  Now on 2000 mcg PO daily.  B12 supportive therapy plan deleted.  Labs today: B12  Labs in 12 weeks: B12

## 2017-01-01 LAB — ERYTHROPOIETIN: Erythropoietin: 15.7 m[IU]/mL (ref 2.6–18.5)

## 2017-01-13 ENCOUNTER — Encounter: Payer: Self-pay | Admitting: Family

## 2017-01-14 ENCOUNTER — Ambulatory Visit (HOSPITAL_COMMUNITY): Payer: PPO

## 2017-01-14 ENCOUNTER — Encounter (HOSPITAL_COMMUNITY): Payer: PPO

## 2017-01-14 DIAGNOSIS — D631 Anemia in chronic kidney disease: Secondary | ICD-10-CM

## 2017-01-14 DIAGNOSIS — N183 Chronic kidney disease, stage 3 (moderate): Principal | ICD-10-CM

## 2017-01-14 DIAGNOSIS — I129 Hypertensive chronic kidney disease with stage 1 through stage 4 chronic kidney disease, or unspecified chronic kidney disease: Secondary | ICD-10-CM | POA: Diagnosis not present

## 2017-01-14 LAB — CBC
HEMATOCRIT: 31.3 % — AB (ref 39.0–52.0)
HEMOGLOBIN: 10.2 g/dL — AB (ref 13.0–17.0)
MCH: 30.8 pg (ref 26.0–34.0)
MCHC: 32.6 g/dL (ref 30.0–36.0)
MCV: 94.6 fL (ref 78.0–100.0)
Platelets: 174 10*3/uL (ref 150–400)
RBC: 3.31 MIL/uL — AB (ref 4.22–5.81)
RDW: 14.8 % (ref 11.5–15.5)
WBC: 5.2 10*3/uL (ref 4.0–10.5)

## 2017-01-22 ENCOUNTER — Encounter: Payer: Self-pay | Admitting: Family

## 2017-01-22 ENCOUNTER — Ambulatory Visit (INDEPENDENT_AMBULATORY_CARE_PROVIDER_SITE_OTHER)
Admission: RE | Admit: 2017-01-22 | Discharge: 2017-01-22 | Disposition: A | Discharge status: Home / Self Care | Payer: PPO | Source: Ambulatory Visit | Attending: Vascular Surgery | Admitting: Vascular Surgery

## 2017-01-22 ENCOUNTER — Ambulatory Visit (HOSPITAL_COMMUNITY)
Admission: RE | Admit: 2017-01-22 | Discharge: 2017-01-22 | Disposition: A | Discharge status: Home / Self Care | Payer: PPO | Source: Ambulatory Visit | Attending: Vascular Surgery | Admitting: Vascular Surgery

## 2017-01-22 ENCOUNTER — Ambulatory Visit (INDEPENDENT_AMBULATORY_CARE_PROVIDER_SITE_OTHER): Payer: PPO | Admitting: Family

## 2017-01-22 VITALS — BP 134/60 | HR 70 | Temp 98.0°F | Resp 16 | Ht 72.0 in | Wt 215.0 lb

## 2017-01-22 DIAGNOSIS — L918 Other hypertrophic disorders of the skin: Secondary | ICD-10-CM

## 2017-01-22 DIAGNOSIS — I739 Peripheral vascular disease, unspecified: Secondary | ICD-10-CM

## 2017-01-22 DIAGNOSIS — I872 Venous insufficiency (chronic) (peripheral): Secondary | ICD-10-CM

## 2017-01-22 DIAGNOSIS — I6523 Occlusion and stenosis of bilateral carotid arteries: Secondary | ICD-10-CM

## 2017-01-22 NOTE — Patient Instructions (Addendum)
Peripheral Vascular Disease Peripheral vascular disease (PVD) is a disease of the blood vessels that are not part of your heart and brain. A simple term for PVD is poor circulation. In most cases, PVD narrows the blood vessels that carry blood from your heart to the rest of your body. This can result in a decreased supply of blood to your arms, legs, and internal organs, like your stomach or kidneys. However, it most often affects a person's lower legs and feet. There are two types of PVD.  Organic PVD. This is the more common type. It is caused by damage to the structure of blood vessels.  Functional PVD. This is caused by conditions that make blood vessels contract and tighten (spasm). Without treatment, PVD tends to get worse over time. PVD can also lead to acute ischemic limb. This is when an arm or limb suddenly has trouble getting enough blood. This is a medical emergency. Follow these instructions at home:  Take medicines only as told by your doctor.  Do not use any tobacco products, including cigarettes, chewing tobacco, or electronic cigarettes. If you need help quitting, ask your doctor.  Lose weight if you are overweight, and maintain a healthy weight as told by your doctor.  Eat a diet that is low in fat and cholesterol. If you need help, ask your doctor.  Exercise regularly. Ask your doctor for some good activities for you.  Take good care of your feet.  Wear comfortable shoes that fit well.  Check your feet often for any cuts or sores. Contact a doctor if:  You have cramps in your legs while walking.  You have leg pain when you are at rest.  You have coldness in a leg or foot.  Your skin changes.  You are unable to get or have an erection (erectile dysfunction).  You have cuts or sores on your feet that are not healing. Get help right away if:  Your arm or leg turns cold and blue.  Your arms or legs become red, warm, swollen, painful, or numb.  You have  chest pain or trouble breathing.  You suddenly have weakness in your face, arm, or leg.  You become very confused or you cannot speak.  You suddenly have a very bad headache.  You suddenly cannot see. This information is not intended to replace advice given to you by your health care provider. Make sure you discuss any questions you have with your health care provider. Document Released: 12/02/2009 Document Revised: 02/13/2016 Document Reviewed: 02/15/2014 Elsevier Interactive Patient Education  2017 [employer].     Stroke Prevention Some medical conditions and behaviors are associated with an increased chance of having a stroke. You may prevent a stroke by making healthy choices and managing medical conditions. How can I reduce my risk of having a stroke?  Stay physically active. Get at least 30 minutes of activity on most or all days.  Do not smoke. It may also be helpful to avoid exposure to secondhand smoke.  Limit alcohol use. Moderate alcohol use is considered to be:  No more than 2 drinks per day for men.  No more than 1 drink per day for nonpregnant women.  Eat healthy foods. This involves:  Eating 5 or more servings of fruits and vegetables a day.  Making dietary changes that address high blood pressure (hypertension), high cholesterol, diabetes, or obesity.  Manage your cholesterol levels.  Making food choices that are high in fiber and low in saturated  fat, trans fat, and cholesterol may control cholesterol levels.  Take any prescribed medicines to control cholesterol as directed by your health care provider.  Manage your diabetes.  Controlling your carbohydrate and sugar intake is recommended to manage diabetes.  Take any prescribed medicines to control diabetes as directed by your health care provider.  Control your hypertension.  Making food choices that are low in salt (sodium), saturated fat, trans fat, and cholesterol is recommended to manage  hypertension.  Ask your health care provider if you need treatment to lower your blood pressure. Take any prescribed medicines to control hypertension as directed by your health care provider.  If you are 94-66 years of age, have your blood pressure checked every 3-5 years. If you are 47 years of age or older, have your blood pressure checked every year.  Maintain a healthy weight.  Reducing calorie intake and making food choices that are low in sodium, saturated fat, trans fat, and cholesterol are recommended to manage weight.  Stop drug abuse.  Avoid taking birth control pills.  Talk to your health care provider about the risks of taking birth control pills if you are over 67 years old, smoke, get migraines, or have ever had a blood clot.  Get evaluated for sleep disorders (sleep apnea).  Talk to your health care provider about getting a sleep evaluation if you snore a lot or have excessive sleepiness.  Take medicines only as directed by your health care provider.  For some people, aspirin or blood thinners (anticoagulants) are helpful in reducing the risk of forming abnormal blood clots that can lead to stroke. If you have the irregular heart rhythm of atrial fibrillation, you should be on a blood thinner unless there is a good reason you cannot take them.  Understand all your medicine instructions.  Make sure that other conditions (such as anemia or atherosclerosis) are addressed. Get help right away if:  You have sudden weakness or numbness of the face, arm, or leg, especially on one side of the body.  Your face or eyelid droops to one side.  You have sudden confusion.  You have trouble speaking (aphasia) or understanding.  You have sudden trouble seeing in one or both eyes.  You have sudden trouble walking.  You have dizziness.  You have a loss of balance or coordination.  You have a sudden, severe headache with no known cause.  You have new chest pain or an  irregular heartbeat. Any of these symptoms may represent a serious problem that is an emergency. Do not wait to see if the symptoms will go away. Get medical help at once. Call your local emergency services (911 in [place] Do not drive yourself to the hospital. This information is not intended to replace advice given to you by your health care provider. Make sure you discuss any questions you have with your health care provider. Document Released: 10/15/2004 Document Revised: 02/13/2016 Document Reviewed: 03/10/2013 Elsevier Interactive Patient Education  2017 [employer]     Chronic Venous Insufficiency Chronic venous insufficiency, also called venous stasis, is a condition that prevents blood from being pumped effectively through the veins in your legs. Blood may no longer be pumped effectively from the legs back to the heart. This condition can range from mild to severe. With proper treatment, you should be able to continue with an active life. What are the causes? Chronic venous insufficiency occurs when the vein walls become stretched, weakened, or damaged, or when valves within the  vein are damaged. Some common causes of this include:  High blood pressure inside the veins (venous hypertension).  Increased blood pressure in the leg veins from long periods of sitting or standing.  A blood clot that blocks blood flow in a vein (deep vein thrombosis, DVT).  Inflammation of a vein (phlebitis) that causes a blood clot to form.  Tumors in the pelvis that cause blood to back up. What increases the risk? The following factors may make you more likely to develop this condition:  Having a family history of this condition.  Obesity.  Pregnancy.  Living without enough physical activity or exercise (sedentary lifestyle).  Smoking.  Having a job that requires long periods of standing or sitting in one place.  Being a certain age. Women in their 42s and 51s and men in their 39s are more  likely to develop this condition. What are the signs or symptoms? Symptoms of this condition include:  Veins that are enlarged, bulging, or twisted (varicose veins).  Skin breakdown or ulcers.  Reddened or discolored skin on the front of the leg.  Brown, smooth, tight, and painful skin just above the ankle, usually on the inside of the leg (lipodermatosclerosis).  Swelling. How is this diagnosed? This condition may be diagnosed based on:  Your medical history.  A physical exam.  Tests, such as:  A procedure that creates an image of a blood vessel and nearby organs and provides information about blood flow through the blood vessel (duplex ultrasound).  A procedure that tests blood flow (plethysmography).  A procedure to look at the veins using X-ray and dye (venogram). How is this treated? The goals of treatment are to help you return to an active life and to minimize pain or disability. Treatment depends on the severity of your condition, and it may include:  Wearing compression stockings. These can help relieve symptoms and help prevent your condition from getting worse. However, they do not cure the condition.  Sclerotherapy. This is a procedure involving an injection of a material that "dissolves" damaged veins.  Surgery. This may involve:  Removing a diseased vein (vein stripping).  Cutting off blood flow through the vein (laser ablation surgery).  Repairing a valve. Follow these instructions at home:  Wear compression stockings as told by your health care provider. These stockings help to prevent blood clots and reduce swelling in your legs.  Take over-the-counter and prescription medicines only as told by your health care provider.  Stay active by exercising, walking, or doing different activities. Ask your health care provider what activities are safe for you and how much exercise you need.  Drink enough fluid to keep your urine clear or pale yellow.  Do not  use any products that contain nicotine or tobacco, such as cigarettes and e-cigarettes. If you need help quitting, ask your health care provider.  Keep all follow-up visits as told by your health care provider. This is important. Contact a health care provider if:  You have redness, swelling, or more pain in the affected area.  You see a red streak or line that extends up or down from the affected area.  You have skin breakdown or a loss of skin in the affected area, even if the breakdown is small.  You get an injury in the affected area. Get help right away if:  You get an injury and an open wound in the affected area.  You have severe pain that does not get better with medicine.  You have sudden numbness or weakness in the foot or ankle below the affected area, or you have trouble moving your foot or ankle.  You have a fever and you have worse or persistent symptoms.  You have chest pain.  You have shortness of breath. Summary  Chronic venous insufficiency, also called venous stasis, is a condition that prevents blood from being pumped effectively through the veins in your legs.  Chronic venous insufficiency occurs when the vein walls become stretched, weakened, or damaged, or when valves within the vein are damaged.  Treatment for this condition depends on how severe your condition is, and it may involve wearing compression stockings or having a procedure.  Make sure you stay active by exercising, walking, or doing different activities. Ask your health care provider what activities are safe for you and how much exercise you need. This information is not intended to replace advice given to you by your health care provider. Make sure you discuss any questions you have with your health care provider. Document Released: 01/11/2007 Document Revised: 07/27/2016 Document Reviewed: 07/27/2016 Elsevier Interactive Patient Education  2017 [employer].

## 2017-01-22 NOTE — Progress Notes (Signed)
[facility] [place] HISTORY AND PHYSICAL   MRN : [id number]  History of Present Illness:   Stephen [name] is a 76 y.o. male with history of bilateral carotid endarterectomies performed by Dr. Kellie Simmering in the past. Regarding his right carotid endarterectomy he had a stroke that affected his left side with minimal residual weakness on that side.   He has a known history of claudication. At this point he can only walk a few minutes prior to having calf pain and it is in the right greater than the left. He also has pain with any standing with a known history of lumbar spine disease. He also has associated swelling of his bilateral lower extremities he does not wear compression stockings for this. He is a former smoker having quit in 1974. He also has type 2 diabetes. He does take an aspirin and a statin daily. He has no ulceration or tissue loss of his bilateral lower extremities and denies any rest pain specifically saying his legs feel best when he is resting.  Dr. Donzetta Matters evaluated pt on 07-24-16. At that time his ABIs were quite depressed with monophasic waveforms at his bilateral ankles. Dr. Donzetta Matters counseled him that he has a low risk for requiring amputation in the future given that he is only a claudication without rest pain or tissue loss at this time. His pain is likely multifactorial with lumbar spinal disease as well as significant edema of his bilateral lower extremities.  Dr. Donzetta Matters offered him angiogram to evaluate his right leg followed by possibly his left leg at a separate time given his elevated creatinine. At that time however he thought his swelling and back pain are major contributor and angiogram would not fix his overall condition. Dr. Donzetta Matters tended to agree with this but should pt want aortogram with right lower extremity runoff first he will call to set that up.  He will continue aspirin and statin in the meantime.  Pt states that he has been using the same 2 injection  sites for insulin in his lower abdomen since the 1980's, with no other site rotation. Consequently, he has developed two sclerosed mounds of skin in either lower abdominal quadrant, in which his insulin has limited absorption capacity.   Pt reports that he had 2 TIA's December 2017, was found that he had atrial fib at that time.   Pt Diabetic: Yes, pt states his last A1C was "good" Pt smoker: former smoker, quit in 1974, started at age 89 years  Pt meds include: Statin :Yes Betablocker: Yes ASA: Yes Other anticoagulants/antiplatelets: Eliquis, for atrial fib.   Current Outpatient Prescriptions  Medication Sig Dispense Refill  . acetaminophen (TYLENOL) 500 MG tablet Take 500 mg by mouth every 6 (six) hours as needed.    Marland Kitchen allopurinol (ZYLOPRIM) 100 MG tablet     . amLODipine (NORVASC) 5 MG tablet Take 5 mg by mouth daily.    Marland Kitchen apixaban (ELIQUIS) 5 MG TABS tablet Take 1 tablet (5 mg total) by mouth 2 (two) times daily. 60 tablet 5  . aspirin 81 MG tablet Take 81 mg by mouth daily.    Marland Kitchen atenolol (TENORMIN) 50 MG tablet Take 50 mg by mouth daily.      . colchicine 0.6 MG tablet Take 0.6 mg by mouth daily.    Marland Kitchen doxazosin (CARDURA) 2 MG tablet Take 2 mg by mouth daily.    . furosemide (LASIX) 40 MG tablet Take 1 tablet (40 mg total) by mouth  daily. TAKE 1 AND 1/2 TABLET (60 MG) IN THE AM AND TAKE 1 TABLET (40 MG) IN THE EVENING 225 tablet 2  . insulin aspart (NOVOLOG) 100 UNIT/ML injection Inject 15 Units into the skin 2 (two) times daily.     . insulin detemir (LEVEMIR) 100 UNIT/ML injection Inject 50 Units into the skin at bedtime.     . Lansoprazole (PREVACID PO) Take by mouth as needed.    Marland Kitchen losartan (COZAAR) 100 MG tablet Take 100 mg by mouth daily.     . pravastatin (PRAVACHOL) 40 MG tablet Take 40 mg by mouth daily.    . vitamin B-12 (CYANOCOBALAMIN) 1000 MCG tablet Take 2 tabs by mouth daily. 60 tablet 5  . HYDROcodone-acetaminophen (NORCO/VICODIN) 5-325 MG tablet Take 1 tablet by  mouth every 6 (six) hours as needed for severe pain. (Patient not taking: Reported on 01/22/2017) 8 tablet 0   No current facility-administered medications for this visit.    Facility-Administered Medications Ordered in Other Visits  Medication Dose Route Frequency Provider Last Rate Last Dose  . 0.9 %  sodium chloride infusion   Intravenous Continuous Baird Cancer, PA-C 10 mL/hr at 08/31/16 8527    . acetaminophen (TYLENOL) tablet 650 mg  650 mg Oral Once [name], MD        Past Medical History:  Diagnosis Date  . Anemia 06/01/2012   H&H of 10/30 in 8/13 with normal MCV   . Anemia of chronic renal failure, stage 3 (moderate) 06/01/2012   H&H of 10/30 in 8/13 with normal MCV  . Atrial fibrillation ([place])    Documented 08/2016  . B12 deficiency 08/17/2016  . Carotid artery occlusion    Right carotid endarterectomy 2001; left carotid endarterectomy in 2008  . Celiac disease   . Chronic renal disease, stage 3, moderately decreased glomerular filtration rate between 30-59 mL/min/1.73 square meter 06/01/2012  . Coronary atherosclerosis of native coronary artery    PTCA of LAD in 1994; total obstruction of the RCA treated medically; subsequent stress nuclear study with inferior ischemia  . DDD (degenerative disc disease), lumbar    Lumbar surgery in 1984  . Diabetes mellitus type II   . Essential hypertension, benign   . History of stroke   . Hyperlipidemia   . Iron deficiency anemia 12/31/2016  . Peripheral vascular disease ([place])   . Stroke [facility])    Right brain 2001; right PCA, MCA/PCA, left SCA infarcts 08/2017 with atrial fibrillation    Social History Social History  Substance Use Topics  . Smoking status: Former Smoker    Packs/day: 3.00    Years: 10.00    Types: Cigarettes    Quit date: 09/21/1972  . Smokeless tobacco: Never Used  . Alcohol use No    Family History Family History  Problem Relation Age of Onset  . Heart disease Mother     Before age 85  .  Diabetes Mother   . Heart attack Mother   . Diabetes Sister   . Heart disease Brother   . Diabetes Brother   . Diabetes Daughter   . Diabetes Son   . Hyperlipidemia Son     Surgical History Past Surgical History:  Procedure Laterality Date  . APPENDECTOMY  1970  . CAROTID ENDARTERECTOMY  2008   Left   . CAROTID ENDARTERECTOMY  06/15/2000   Right  . COLONOSCOPY N/A 10/10/2015   Procedure: COLONOSCOPY;  Surgeon: Rogene Houston, MD;  Location: [facility];  Service:  Endoscopy;  Laterality: N/A;  12:25 - moved to 11:15 - Ann to notify  . [place] SURGERY  2000, 2001   L5 HNP required 2 surgical procedures    No Known Allergies  Current Outpatient Prescriptions  Medication Sig Dispense Refill  . acetaminophen (TYLENOL) 500 MG tablet Take 500 mg by mouth every 6 (six) hours as needed.    Marland Kitchen allopurinol (ZYLOPRIM) 100 MG tablet     . amLODipine (NORVASC) 5 MG tablet Take 5 mg by mouth daily.    Marland Kitchen apixaban (ELIQUIS) 5 MG TABS tablet Take 1 tablet (5 mg total) by mouth 2 (two) times daily. 60 tablet 5  . aspirin 81 MG tablet Take 81 mg by mouth daily.    Marland Kitchen atenolol (TENORMIN) 50 MG tablet Take 50 mg by mouth daily.      . colchicine 0.6 MG tablet Take 0.6 mg by mouth daily.    Marland Kitchen doxazosin (CARDURA) 2 MG tablet Take 2 mg by mouth daily.    . furosemide (LASIX) 40 MG tablet Take 1 tablet (40 mg total) by mouth daily. TAKE 1 AND 1/2 TABLET (60 MG) IN THE AM AND TAKE 1 TABLET (40 MG) IN THE EVENING 225 tablet 2  . insulin aspart (NOVOLOG) 100 UNIT/ML injection Inject 15 Units into the skin 2 (two) times daily.     . insulin detemir (LEVEMIR) 100 UNIT/ML injection Inject 50 Units into the skin at bedtime.     . Lansoprazole (PREVACID PO) Take by mouth as needed.    Marland Kitchen losartan (COZAAR) 100 MG tablet Take 100 mg by mouth daily.     . pravastatin (PRAVACHOL) 40 MG tablet Take 40 mg by mouth daily.    . vitamin B-12 (CYANOCOBALAMIN) 1000 MCG tablet Take 2 tabs by mouth daily. 60 tablet 5   . HYDROcodone-acetaminophen (NORCO/VICODIN) 5-325 MG tablet Take 1 tablet by mouth every 6 (six) hours as needed for severe pain. (Patient not taking: Reported on 01/22/2017) 8 tablet 0   No current facility-administered medications for this visit.    Facility-Administered Medications Ordered in Other Visits  Medication Dose Route Frequency Provider Last Rate Last Dose  . 0.9 %  sodium chloride infusion   Intravenous Continuous Baird Cancer, PA-C 10 mL/hr at 08/31/16 4562    . acetaminophen (TYLENOL) tablet 650 mg  650 mg Oral Once [name], MD         REVIEW OF SYSTEMS: See HPI for pertinent positives and negatives.  Physical Examination Vitals:   01/22/17 1415 01/22/17 1419  BP: (!) 109/57 134/60  Pulse: 70 70  Resp: 16   Temp: 98 F (36.7 C)   SpO2: 98%   Weight: 215 lb (97.5 kg)   Height: 6' (1.829 m)    Body mass index is 29.16 kg/m.  General: Well nourished male in NAD Gait: Normal HENT: WNL Eyes: Pupils equal Pulmonary: normal non-labored breathing, no rales, rhonchi, or wheezing, diminished air movement in all fields.  Cardiac: Irregular rhythm, controlled rate, no murmur detected.   Abdomen: soft, NT, sclerotic and proliferative areas of tissue in right an left lower quadrants where he has been injecting his insulin since the 1980's, not rotating his sites.  Skin: no rashes, no gangrene, no cellulitis, see Abdomen.   VASCULAR EXAM  Carotid Bruits Right Left   Positive Positive   Radial pulses are 2+ palpable and = Abdominal aortic pulse is not palpable.  VASCULAR EXAM: Extremities without ischemic changes  without Gangrene; without open wounds. Bilateral lower legs with 1-2+ pitting edema and mild erythema, mild stasis dermatitis changes.                                                                                                                                                        LE Pulses Right Left       POPLITEAL   not palpable   not palpable       POSTERIOR TIBIAL  not palpable   not palpable        DORSALIS PEDIS      ANTERIOR TIBIAL not palpable  not palpable     Musculoskeletal: no muscle wasting or atrophy; 1+ bilateral peripheral pitting edema         Neurologic: A&O X 3; Appropriate Affect ;  SENSATION: normal; MOTOR FUNCTION: 5/5 Symmetric, CN 2-12 intact Speech is fluent/normal     ASSESSMENT:  Stephen [name] is a 76 y.o. male who is s/p Bilateral CEA in 2001 (right) and 2008 (left). Extracranial carotid artery stenosis: He has had no stroke or TIA activity in the last several years.   PAOD: He claudicates at 1 block with cramping in both legs, relieved with rest, but if he is holding on to a cart he can walk at least 30 minutes. This is unchanged from a year ago. He denies night or rest pain and has had no non-healing ulcers.   Chronic venous insufficiency: 1-2+ pitting edema in both lower legs that mostly resolves with overnight elevation; mild chronic venous stasis dermatitis changes.  Hypertrophic mounds of tissue at both lower quadrant abdomen form using these same two site for insulin injections since the 1980's: I instructed pt in other sites for insulin injections: anterior thighs and posterior aspects both upper arms. I advised him that he should consult his PCP, and may need to decrease his insulin use since the insulin will be better absorbed at sites where there are no mounds of sclerosed tissue.   DATA (01-22-17):  Carotid Duplex: Technically difficult exam due to calcific plaque, high bifurcation, and deep vessels. <40%stenosis of bilateral CEA sites. Bilateral vertebral artery flow is antegrade.  Bilateral subclavian artery waveforms are normal.  Comparison to the previous exam (01-21-16) not appropriate due to sub optimal imaging on both exams.  ABI: Right: 0.58 (0.55, 07-24-16), waveforms: monophasic; TBI: 0.45 (was 0.47) Left: 0.66 (0.48, 07-24-16),  waveforms: monophasic; TBI: 0.41 (was 0.38) Bilateral ABI and TBI remain stable with moderate arterial occlusive disease, all monophasic waveforms.    DATA  PLAN:   Pt and wife instructed in elevation of feet/legs to promote resorption of dependent edema: feet above slightly flexed knees, feet slight above heart level overnight and 3-4x/day x 20 minutes. Since he has considerable PAD, should  probably not use compression hose.   Based on today's exam and non-invasive vascular lab results, the patient will follow up in 6 months with the following tests: ABI's, carotid duplex in a year. . I discussed in depth with the patient the nature of atherosclerosis, and emphasized the importance of maximal medical management including strict control of blood pressure, blood glucose, and lipid levels, obtaining regular exercise, and cessation of smoking.  The patient is aware that without maximal medical management the underlying atherosclerotic disease process will progress, limiting the benefit of any interventions.  The patient was given information about stroke prevention and what symptoms should prompt the patient to seek immediate medical care.  The patient was given information about PAD including signs, symptoms, treatment, what symptoms should prompt the patient to seek immediate medical care, and risk reduction measures to take. Thank you for allowing [place] to participate in this patient's care.  Clemon Chambers, RN, MSN, FNP-C [facility] Office: [phone]  Clinic MD: Donzetta Matters 01/22/2017 2:28 PM

## 2017-01-26 NOTE — Addendum Note (Signed)
Addended by: Lianne Cure A on: 01/26/2017 09:19 AM   Modules accepted: Orders

## 2017-01-28 ENCOUNTER — Encounter (HOSPITAL_COMMUNITY): Payer: Self-pay

## 2017-01-28 ENCOUNTER — Encounter (HOSPITAL_COMMUNITY): Payer: PPO

## 2017-01-28 ENCOUNTER — Encounter (HOSPITAL_COMMUNITY): Payer: PPO | Attending: Oncology

## 2017-01-28 VITALS — BP 112/41 | HR 71 | Temp 98.0°F | Resp 16

## 2017-01-28 DIAGNOSIS — N183 Chronic kidney disease, stage 3 unspecified: Secondary | ICD-10-CM

## 2017-01-28 DIAGNOSIS — D631 Anemia in chronic kidney disease: Secondary | ICD-10-CM | POA: Diagnosis not present

## 2017-01-28 LAB — CBC
HEMATOCRIT: 29.6 % — AB (ref 39.0–52.0)
HEMOGLOBIN: 9.8 g/dL — AB (ref 13.0–17.0)
MCH: 31.1 pg (ref 26.0–34.0)
MCHC: 33.1 g/dL (ref 30.0–36.0)
MCV: 94 fL (ref 78.0–100.0)
Platelets: 136 10*3/uL — ABNORMAL LOW (ref 150–400)
RBC: 3.15 MIL/uL — ABNORMAL LOW (ref 4.22–5.81)
RDW: 15.1 % (ref 11.5–15.5)
WBC: 5.7 10*3/uL (ref 4.0–10.5)

## 2017-01-28 MED ORDER — DARBEPOETIN ALFA 100 MCG/0.5ML IJ SOSY
PREFILLED_SYRINGE | INTRAMUSCULAR | Status: AC
  Filled 2017-01-28: qty 0.5

## 2017-01-28 MED ORDER — DARBEPOETIN ALFA 100 MCG/0.5ML IJ SOSY
80.0000 ug | PREFILLED_SYRINGE | Freq: Once | INTRAMUSCULAR | Status: AC
  Administered 2017-01-28: 80 ug via SUBCUTANEOUS

## 2017-01-28 NOTE — Patient Instructions (Signed)
[facility] at [facility] Discharge Instructions  RECOMMENDATIONS MADE BY THE CONSULTANT AND ANY TEST RESULTS WILL BE SENT TO YOUR REFERRING PHYSICIAN.  Aranesp given today  Follow up as scheduled.  Thank you for choosing [facility] at [facility] to provide your oncology and hematology care.  To afford each patient quality time with our provider, please arrive at least 15 minutes before your scheduled appointment time.    If you have a lab appointment with the Cancer Center please come in thru the  Main Entrance and check in at the main information desk  You need to re-schedule your appointment should you arrive 10 or more minutes late.  We strive to give you quality time with our providers, and arriving late affects you and other patients whose appointments are after yours.  Also, if you no show three or more times for appointments you may be dismissed from the clinic at the providers discretion.     Again, thank you for choosing [facility].  Our hope is that these requests will decrease the amount of time that you wait before being seen by our physicians.       _____________________________________________________________  Should you have questions after your visit to [facility], please contact our office at [phone] between the hours of 8:30 a.m. and 4:30 p.m.  Voicemails left after 4:30 p.m. will not be returned until the following business day.  For prescription refill requests, have your pharmacy contact our office.       Resources For Cancer Patients and their Caregivers ? American Cancer Society: Can assist with transportation, wigs, general needs, runs Look Good Feel Better.        1-888-227-6333 ? Cancer Care: Provides financial assistance, online support groups, medication/co-pay assistance.  1-800-813-HOPE (4673) ? [facility] Assists [place] cancer patients and  their families through emotional , educational and financial support.  336-427-4357 ? [place] DSS Where to apply for food stamps, Medicaid and utility assistance. 336-342-1394 ? RCATS: Transportation to medical appointments. 336-347-2287 ? Social Security Administration: May apply for disability if have a Stage IV cancer. 336-342-7796 1-800-772-1213 ? [place] Aging, Disability and Transit Services: Assists with nutrition, care and transit needs. 336-349-2343  Cancer Center Support Programs: @10RELATIVEDAYS@ > Cancer Support Group  2nd Tuesday of the month 1pm-2pm, Journey Room  > Creative Journey  3rd Tuesday of the month 1130am-1pm, Journey Room  > Look Good Feel Better  1st Wednesday of the month 10am-12 noon, Journey Room (Call American Cancer [employer] to register 1-800-395-5775)   

## 2017-01-28 NOTE — Progress Notes (Signed)
Les Pou presents today for injection per MD orders. Aranesp 34mcg administered SQ in right Abdomen. Administration without incident. Patient tolerated well. Vitals stable and discharged home from clinic ambulatory. Follow up as scheduled.

## 2017-02-05 DIAGNOSIS — D638 Anemia in other chronic diseases classified elsewhere: Secondary | ICD-10-CM | POA: Diagnosis not present

## 2017-02-05 DIAGNOSIS — R809 Proteinuria, unspecified: Secondary | ICD-10-CM | POA: Diagnosis not present

## 2017-02-05 DIAGNOSIS — I10 Essential (primary) hypertension: Secondary | ICD-10-CM | POA: Diagnosis not present

## 2017-02-05 DIAGNOSIS — Z7189 Other specified counseling: Secondary | ICD-10-CM | POA: Diagnosis not present

## 2017-02-05 DIAGNOSIS — N184 Chronic kidney disease, stage 4 (severe): Secondary | ICD-10-CM | POA: Diagnosis not present

## 2017-02-11 ENCOUNTER — Encounter (HOSPITAL_BASED_OUTPATIENT_CLINIC_OR_DEPARTMENT_OTHER): Payer: PPO

## 2017-02-11 ENCOUNTER — Encounter (HOSPITAL_COMMUNITY): Payer: PPO

## 2017-02-11 ENCOUNTER — Other Ambulatory Visit (HOSPITAL_COMMUNITY): Payer: Self-pay | Admitting: Oncology

## 2017-02-11 VITALS — BP 107/47 | HR 74 | Temp 98.4°F | Resp 18

## 2017-02-11 DIAGNOSIS — N183 Chronic kidney disease, stage 3 unspecified: Secondary | ICD-10-CM

## 2017-02-11 DIAGNOSIS — D631 Anemia in chronic kidney disease: Secondary | ICD-10-CM

## 2017-02-11 DIAGNOSIS — D508 Other iron deficiency anemias: Secondary | ICD-10-CM

## 2017-02-11 LAB — CBC
HEMATOCRIT: 29.5 % — AB (ref 39.0–52.0)
HEMOGLOBIN: 9.3 g/dL — AB (ref 13.0–17.0)
MCH: 30.4 pg (ref 26.0–34.0)
MCHC: 31.5 g/dL (ref 30.0–36.0)
MCV: 96.4 fL (ref 78.0–100.0)
Platelets: 134 10*3/uL — ABNORMAL LOW (ref 150–400)
RBC: 3.06 MIL/uL — ABNORMAL LOW (ref 4.22–5.81)
RDW: 15.8 % — ABNORMAL HIGH (ref 11.5–15.5)
WBC: 6.6 10*3/uL (ref 4.0–10.5)

## 2017-02-11 MED ORDER — DARBEPOETIN ALFA 100 MCG/0.5ML IJ SOSY
80.0000 ug | PREFILLED_SYRINGE | Freq: Once | INTRAMUSCULAR | Status: AC
  Administered 2017-02-11: 80 ug via SUBCUTANEOUS

## 2017-02-11 MED ORDER — DARBEPOETIN ALFA 100 MCG/0.5ML IJ SOSY
PREFILLED_SYRINGE | INTRAMUSCULAR | Status: AC
  Filled 2017-02-11: qty 0.5

## 2017-02-11 NOTE — Progress Notes (Signed)
Stephen [name] presents today for injection per MD orders. Aranesp 80 mcg administered SQ in left Abdomen. Administration without incident. Patient tolerated well. Stable and ambulatory on discharge home to self.

## 2017-02-11 NOTE — Patient Instructions (Signed)
[place] at [facility] Discharge Instructions  RECOMMENDATIONS MADE BY THE CONSULTANT AND ANY TEST RESULTS WILL BE SENT TO YOUR REFERRING PHYSICIAN.  Hemoglobin 9.3. Aranesp 80 mcg injection given as ordered.  Thank you for choosing [place] at [facility] to provide your oncology and hematology care.  To afford each patient quality time with our provider, please arrive at least 15 minutes before your scheduled appointment time.    If you have a lab appointment with the [facility] please come in thru the  Main Entrance and check in at the main information desk  You need to re-schedule your appointment should you arrive 10 or more minutes late.  We strive to give you quality time with our providers, and arriving late affects you and other patients whose appointments are after yours.  Also, if you no show three or more times for appointments you may be dismissed from the clinic at the providers discretion.     Again, thank you for choosing [facility].  Our hope is that these requests will decrease the amount of time that you wait before being seen by our physicians.       _____________________________________________________________  Should you have questions after your visit to [facility], please contact our office at [phone] between the hours of 8:30 a.m. and 4:30 p.m.  Voicemails left after 4:30 p.m. will not be returned until the following business day.  For prescription refill requests, have your pharmacy contact our office.       Resources For Cancer Patients and their Caregivers ? American Cancer Society: Can assist with transportation, wigs, general needs, runs Look Good Feel Better.        770-334-9256 ? Cancer Care: Provides financial assistance, online support groups, medication/co-pay assistance.  1-800-813-HOPE 972-069-4823) ? [facility] Assists [place] cancer  patients and their families through emotional , educational and financial support.  704 501 6050 ? [place] DSS Where to apply for food stamps, Medicaid and utility assistance. 321-034-1323 ? RCATS: Transportation to medical appointments. 504-427-1080 ? Social Security Administration: May apply for disability if have a Stage IV cancer. 416-425-9936 820-355-4822 ? [employer], Disability and Transit Services: Assists with nutrition, care and transit needs. [place] Support Programs: @10RELATIVEDAYS @ > Cancer Support Group  2nd Tuesday of the month 1pm-2pm, Journey Room  > Creative Journey  3rd Tuesday of the month 1130am-1pm, Journey Room  > Look Good Feel Better  1st Wednesday of the month 10am-12 noon, Journey Room (Call [place] to register [phone])

## 2017-02-12 ENCOUNTER — Other Ambulatory Visit (HOSPITAL_COMMUNITY): Payer: Self-pay | Admitting: Nephrology

## 2017-02-12 DIAGNOSIS — N183 Chronic kidney disease, stage 3 unspecified: Secondary | ICD-10-CM

## 2017-02-12 DIAGNOSIS — J40 Bronchitis, not specified as acute or chronic: Secondary | ICD-10-CM | POA: Diagnosis not present

## 2017-02-25 ENCOUNTER — Encounter (HOSPITAL_COMMUNITY): Payer: PPO | Attending: Oncology

## 2017-02-25 ENCOUNTER — Encounter (HOSPITAL_COMMUNITY): Payer: Self-pay

## 2017-02-25 ENCOUNTER — Encounter (HOSPITAL_BASED_OUTPATIENT_CLINIC_OR_DEPARTMENT_OTHER): Payer: PPO

## 2017-02-25 VITALS — BP 97/51 | HR 69 | Temp 97.8°F | Resp 19

## 2017-02-25 DIAGNOSIS — N183 Chronic kidney disease, stage 3 unspecified: Secondary | ICD-10-CM

## 2017-02-25 DIAGNOSIS — D631 Anemia in chronic kidney disease: Secondary | ICD-10-CM

## 2017-02-25 DIAGNOSIS — D508 Other iron deficiency anemias: Secondary | ICD-10-CM | POA: Insufficient documentation

## 2017-02-25 LAB — CBC
HEMATOCRIT: 29.6 % — AB (ref 39.0–52.0)
HEMOGLOBIN: 9.5 g/dL — AB (ref 13.0–17.0)
MCH: 30.7 pg (ref 26.0–34.0)
MCHC: 32.1 g/dL (ref 30.0–36.0)
MCV: 95.8 fL (ref 78.0–100.0)
Platelets: 141 10*3/uL — ABNORMAL LOW (ref 150–400)
RBC: 3.09 MIL/uL — AB (ref 4.22–5.81)
RDW: 16.6 % — ABNORMAL HIGH (ref 11.5–15.5)
WBC: 6.3 10*3/uL (ref 4.0–10.5)

## 2017-02-25 LAB — FERRITIN: FERRITIN: 210 ng/mL (ref 24–336)

## 2017-02-25 LAB — IRON AND TIBC
Iron: 38 ug/dL — ABNORMAL LOW (ref 45–182)
Saturation Ratios: 15 % — ABNORMAL LOW (ref 17.9–39.5)
TIBC: 249 ug/dL — ABNORMAL LOW (ref 250–450)
UIBC: 211 ug/dL

## 2017-02-25 MED ORDER — DARBEPOETIN ALFA 100 MCG/0.5ML IJ SOSY
80.0000 ug | PREFILLED_SYRINGE | Freq: Once | INTRAMUSCULAR | Status: AC
  Administered 2017-02-25: 80 ug via SUBCUTANEOUS
  Filled 2017-02-25: qty 0.5

## 2017-02-25 NOTE — Patient Instructions (Signed)
[place] at [facility] Discharge Instructions  RECOMMENDATIONS MADE BY THE CONSULTANT AND ANY TEST RESULTS WILL BE SENT TO YOUR REFERRING PHYSICIAN.  Aranesp today (hemoglobin 9.5).  Return as scheduled.   Thank you for choosing [place] at [facility] to provide your oncology and hematology care.  To afford each patient quality time with our provider, please arrive at least 15 minutes before your scheduled appointment time.    If you have a lab appointment with the [place] please come in thru the  Main Entrance and check in at the main information desk  You need to re-schedule your appointment should you arrive 10 or more minutes late.  We strive to give you quality time with our providers, and arriving late affects you and other patients whose appointments are after yours.  Also, if you no show three or more times for appointments you may be dismissed from the clinic at the providers discretion.     Again, thank you for choosing [facility].  Our hope is that these requests will decrease the amount of time that you wait before being seen by our physicians.       _____________________________________________________________  Should you have questions after your visit to [facility], please contact our office at [phone] between the hours of 8:30 a.m. and 4:30 p.m.  Voicemails left after 4:30 p.m. will not be returned until the following business day.  For prescription refill requests, have your pharmacy contact our office.       Resources For Cancer Patients and their Caregivers ? American Cancer Society: Can assist with transportation, wigs, general needs, runs Look Good Feel Better.        (305)371-7303 ? Cancer Care: Provides financial assistance, online support groups, medication/co-pay assistance.  1-800-813-HOPE 330-648-8841) ? [place] Assists [place] cancer  patients and their families through emotional , educational and financial support.  (463)593-8741 ? [place] DSS Where to apply for food stamps, Medicaid and utility assistance. 934-770-3816 ? RCATS: Transportation to medical appointments. (734)651-1949 ? Social Security Administration: May apply for disability if have a Stage IV cancer. (276)025-4517 212 842 1176 ? [employer], Disability and Transit Services: Assists with nutrition, care and transit needs. [place] Support Programs: @10RELATIVEDAYS @ > Cancer Support Group  2nd Tuesday of the month 1pm-2pm, Journey Room  > Creative Journey  3rd Tuesday of the month 1130am-1pm, Journey Room  > Look Good Feel Better  1st Wednesday of the month 10am-12 noon, Journey Room (Call [place] to register [phone])

## 2017-02-25 NOTE — Progress Notes (Signed)
Stephen [name] presents today for injection per the provider's orders.  Aranesp administration without incident; see MAR for injection details.  Patient tolerated procedure well and without incident.  No questions or complaints noted at this time.  Discharged ambulatory.  

## 2017-02-26 ENCOUNTER — Other Ambulatory Visit (HOSPITAL_COMMUNITY): Payer: Self-pay | Admitting: Oncology

## 2017-03-02 ENCOUNTER — Ambulatory Visit (HOSPITAL_COMMUNITY)
Admission: RE | Admit: 2017-03-02 | Discharge: 2017-03-02 | Disposition: A | Discharge status: Home / Self Care | Payer: PPO | Source: Ambulatory Visit | Attending: Nephrology | Admitting: Nephrology

## 2017-03-02 DIAGNOSIS — D519 Vitamin B12 deficiency anemia, unspecified: Secondary | ICD-10-CM | POA: Diagnosis not present

## 2017-03-02 DIAGNOSIS — N183 Chronic kidney disease, stage 3 unspecified: Secondary | ICD-10-CM

## 2017-03-02 DIAGNOSIS — I10 Essential (primary) hypertension: Secondary | ICD-10-CM | POA: Diagnosis not present

## 2017-03-02 DIAGNOSIS — Z79899 Other long term (current) drug therapy: Secondary | ICD-10-CM | POA: Diagnosis not present

## 2017-03-02 DIAGNOSIS — R809 Proteinuria, unspecified: Secondary | ICD-10-CM | POA: Diagnosis not present

## 2017-03-02 DIAGNOSIS — E559 Vitamin D deficiency, unspecified: Secondary | ICD-10-CM | POA: Diagnosis not present

## 2017-03-02 DIAGNOSIS — Z1159 Encounter for screening for other viral diseases: Secondary | ICD-10-CM | POA: Diagnosis not present

## 2017-03-02 DIAGNOSIS — D509 Iron deficiency anemia, unspecified: Secondary | ICD-10-CM | POA: Diagnosis not present

## 2017-03-02 DIAGNOSIS — N200 Calculus of kidney: Secondary | ICD-10-CM | POA: Diagnosis not present

## 2017-03-03 ENCOUNTER — Encounter (HOSPITAL_BASED_OUTPATIENT_CLINIC_OR_DEPARTMENT_OTHER): Payer: PPO

## 2017-03-03 ENCOUNTER — Encounter (HOSPITAL_COMMUNITY): Payer: Self-pay

## 2017-03-03 VITALS — BP 121/56 | HR 63 | Temp 97.6°F | Resp 18

## 2017-03-03 DIAGNOSIS — N183 Chronic kidney disease, stage 3 unspecified: Secondary | ICD-10-CM

## 2017-03-03 DIAGNOSIS — D509 Iron deficiency anemia, unspecified: Secondary | ICD-10-CM

## 2017-03-03 DIAGNOSIS — D631 Anemia in chronic kidney disease: Secondary | ICD-10-CM

## 2017-03-03 MED ORDER — SODIUM CHLORIDE 0.9 % IV SOLN
Freq: Once | INTRAVENOUS | Status: AC
Start: 2017-03-03 — End: 2017-03-03
  Administered 2017-03-03: 14:00:00 via INTRAVENOUS

## 2017-03-03 MED ORDER — SODIUM CHLORIDE 0.9 % IV SOLN
125.0000 mg | Freq: Once | INTRAVENOUS | Status: AC
  Administered 2017-03-03: 125 mg via INTRAVENOUS
  Filled 2017-03-03: qty 10

## 2017-03-03 NOTE — Patient Instructions (Signed)
[facility] at [facility] Discharge Instructions  RECOMMENDATIONS MADE BY THE CONSULTANT AND ANY TEST RESULTS WILL BE SENT TO YOUR REFERRING PHYSICIAN.  Iron infusion today. Return as scheduled.   Thank you for choosing [facility] at [facility] to provide your oncology and hematology care.  To afford each patient quality time with our provider, please arrive at least 15 minutes before your scheduled appointment time.    If you have a lab appointment with the Cancer Center please come in thru the  Main Entrance and check in at the main information desk  You need to re-schedule your appointment should you arrive 10 or more minutes late.  We strive to give you quality time with our providers, and arriving late affects you and other patients whose appointments are after yours.  Also, if you no show three or more times for appointments you may be dismissed from the clinic at the providers discretion.     Again, thank you for choosing [facility].  Our hope is that these requests will decrease the amount of time that you wait before being seen by our physicians.       _____________________________________________________________  Should you have questions after your visit to [facility], please contact our office at [phone] between the hours of 8:30 a.m. and 4:30 p.m.  Voicemails left after 4:30 p.m. will not be returned until the following business day.  For prescription refill requests, have your pharmacy contact our office.       Resources For Cancer Patients and their Caregivers ? American Cancer Society: Can assist with transportation, wigs, general needs, runs Look Good Feel Better.        1-888-227-6333 ? Cancer Care: Provides financial assistance, online support groups, medication/co-pay assistance.  1-800-813-HOPE (4673) ? [facility] Assists [place] cancer patients and their  families through emotional , educational and financial support.  336-427-4357 ? [place] DSS Where to apply for food stamps, Medicaid and utility assistance. 336-342-1394 ? [facility]: Transportation to medical appointments. 336-347-2287 ? Social Security Administration: May apply for disability if have a Stage IV cancer. 336-342-7796 1-800-772-1213 ? [place] Aging, Disability and Transit Services: Assists with nutrition, care and transit needs. 336-349-2343  Cancer Center Support Programs: @[email or web address]@ > Cancer Support Group  2nd Tuesday of the month 1pm-2pm, Journey Room  > Creative Journey  3rd Tuesday of the month 1130am-1pm, Journey Room  > Look Good Feel Better  1st Wednesday of the month 10am-12 noon, Journey Room (Call American Cancer Society to register 1-800-395-5775)   

## 2017-03-03 NOTE — Progress Notes (Signed)
Tolerated infusion w/o adverse reaction.  Alert, in no distress.  VSS.  Discharged ambulatory.  

## 2017-03-11 ENCOUNTER — Encounter (HOSPITAL_COMMUNITY): Payer: Self-pay

## 2017-03-11 ENCOUNTER — Encounter (HOSPITAL_COMMUNITY): Payer: PPO

## 2017-03-11 ENCOUNTER — Encounter (HOSPITAL_BASED_OUTPATIENT_CLINIC_OR_DEPARTMENT_OTHER): Payer: PPO

## 2017-03-11 VITALS — BP 98/53 | HR 58 | Temp 98.0°F | Resp 20

## 2017-03-11 DIAGNOSIS — N183 Chronic kidney disease, stage 3 unspecified: Secondary | ICD-10-CM

## 2017-03-11 DIAGNOSIS — D631 Anemia in chronic kidney disease: Secondary | ICD-10-CM | POA: Diagnosis not present

## 2017-03-11 LAB — CBC
HEMATOCRIT: 27.4 % — AB (ref 39.0–52.0)
HEMOGLOBIN: 8.6 g/dL — AB (ref 13.0–17.0)
MCH: 30.3 pg (ref 26.0–34.0)
MCHC: 31.4 g/dL (ref 30.0–36.0)
MCV: 96.5 fL (ref 78.0–100.0)
Platelets: 144 10*3/uL — ABNORMAL LOW (ref 150–400)
RBC: 2.84 MIL/uL — ABNORMAL LOW (ref 4.22–5.81)
RDW: 17.2 % — ABNORMAL HIGH (ref 11.5–15.5)
WBC: 6.7 10*3/uL (ref 4.0–10.5)

## 2017-03-11 MED ORDER — DARBEPOETIN ALFA 100 MCG/0.5ML IJ SOSY
PREFILLED_SYRINGE | INTRAMUSCULAR | Status: AC
  Filled 2017-03-11: qty 0.5

## 2017-03-11 MED ORDER — DARBEPOETIN ALFA 100 MCG/0.5ML IJ SOSY
80.0000 ug | PREFILLED_SYRINGE | Freq: Once | INTRAMUSCULAR | Status: AC
  Administered 2017-03-11: 80 ug via SUBCUTANEOUS

## 2017-03-11 NOTE — Progress Notes (Signed)
Stephen [name] tolerated Aranesp injection well without complaints or incident. Hgb 8.6. Pt denied any increase in SOB or weakness or C/P and verbalized understanding to call for any of these issues. VSS Pt discharged self ambulatory in satisfactory condition

## 2017-03-11 NOTE — Patient Instructions (Signed)
[facility] at [facility] Discharge Instructions  RECOMMENDATIONS MADE BY THE CONSULTANT AND ANY TEST RESULTS WILL BE SENT TO YOUR REFERRING PHYSICIAN.  Received Aranesp injection today. Follow-up as scheduled. Call clinic for any questions or concerns  Thank you for choosing [facility] at [facility] to provide your oncology and hematology care.  To afford each patient quality time with our provider, please arrive at least 15 minutes before your scheduled appointment time.    If you have a lab appointment with the Cancer Center please come in thru the  Main Entrance and check in at the main information desk  You need to re-schedule your appointment should you arrive 10 or more minutes late.  We strive to give you quality time with our providers, and arriving late affects you and other patients whose appointments are after yours.  Also, if you no show three or more times for appointments you may be dismissed from the clinic at the providers discretion.     Again, thank you for choosing [facility].  Our hope is that these requests will decrease the amount of time that you wait before being seen by our physicians.       _____________________________________________________________  Should you have questions after your visit to  Cancer Center, please contact our office at [phone] between the hours of 8:30 a.m. and 4:30 p.m.  Voicemails left after 4:30 p.m. will not be returned until the following business day.  For prescription refill requests, have your pharmacy contact our office.       Resources For Cancer Patients and their Caregivers ? American Cancer Society: Can assist with transportation, wigs, general needs, runs Look Good Feel Better.        1-888-227-6333 ? Cancer Care: Provides financial assistance, online support groups, medication/co-pay assistance.  1-800-813-HOPE (4673) ? [facility] Assists [place] cancer patients and their families through emotional , educational and financial support.  336-427-4357 ? [place] DSS Where to apply for food stamps, Medicaid and utility assistance. 336-342-1394 ? RCATS: Transportation to medical appointments. 336-347-2287 ? Social Security Administration: May apply for disability if have a Stage IV cancer. 336-342-7796 1-800-772-1213 ? [place] Aging, Disability and Transit Services: Assists with nutrition, care and transit needs. 336-349-2343  Cancer Center Support Programs: @10RELATIVEDAYS@ > Cancer Support Group  2nd Tuesday of the month 1pm-2pm, Journey Room  > Creative Journey  3rd Tuesday of the month 1130am-1pm, Journey Room  > Look Good Feel Better  1st Wednesday of the month 10am-12 noon, Journey Room (Call American Cancer Society to register 1-800-395-5775)   

## 2017-03-16 ENCOUNTER — Encounter: Payer: Self-pay | Admitting: Nurse Practitioner

## 2017-03-16 ENCOUNTER — Ambulatory Visit (INDEPENDENT_AMBULATORY_CARE_PROVIDER_SITE_OTHER): Payer: PPO | Admitting: Nurse Practitioner

## 2017-03-16 VITALS — BP 114/61 | HR 68 | Ht 72.0 in | Wt 216.8 lb

## 2017-03-16 DIAGNOSIS — I6340 Cerebral infarction due to embolism of unspecified cerebral artery: Secondary | ICD-10-CM

## 2017-03-16 DIAGNOSIS — E785 Hyperlipidemia, unspecified: Secondary | ICD-10-CM

## 2017-03-16 DIAGNOSIS — I10 Essential (primary) hypertension: Secondary | ICD-10-CM

## 2017-03-16 DIAGNOSIS — I499 Cardiac arrhythmia, unspecified: Secondary | ICD-10-CM

## 2017-03-16 DIAGNOSIS — Z79899 Other long term (current) drug therapy: Secondary | ICD-10-CM | POA: Diagnosis not present

## 2017-03-16 DIAGNOSIS — M109 Gout, unspecified: Secondary | ICD-10-CM | POA: Diagnosis not present

## 2017-03-16 DIAGNOSIS — E1129 Type 2 diabetes mellitus with other diabetic kidney complication: Secondary | ICD-10-CM | POA: Diagnosis not present

## 2017-03-16 DIAGNOSIS — N183 Chronic kidney disease, stage 3 (moderate): Secondary | ICD-10-CM | POA: Diagnosis not present

## 2017-03-16 NOTE — Patient Instructions (Signed)
Stressed the importance of management of risk factors to prevent further stroke Continue Eliquis and aspirin for secondary stroke prevention and atrial fibrillation Maintain strict control of hypertension with blood pressure goal below 130/90, today's reading 114/61,  continue antihypertensive medications Control of diabetes with hemoglobin A1c below 6.5 followed by primary care continue diabetic medications Cholesterol with LDL cholesterol less than 70, followed by primary care,  Continue pravastatin Exercise by walking, slowly increase , eat healthy diet with whole grains,  fresh fruits and vegetables Follow up in 6 months

## 2017-03-16 NOTE — Progress Notes (Signed)
[facility]  PATIENT: Stephen [name] DOB: 1941-01-31   REASON FOR VISIT:  follow-up for stroke, new diagnosis of atrial fibrillation HISTORY FROM: Patient and  Stephen [name] fianc    HISTORY OF PRESENT ILLNESS: UPDATE 06/26/2018CM Stephen [name], 76 year old male returns for stroke follow up. He has a history component of right PCA territory infarct affecting the posterior medial temporal lobe occipital lobe and right thalamus 09/04/2016.He is currently on eliquis and aspirin for secondary stroke prevention and atrial fibrillation.H has not had further stroke symptoms since that time, he has minimal bruising and no bleeding. Blood pressure in the office today 114/61. Patient claims his blood sugars run 88-120. He remains on Pravachol without complaints of myalgias. He  admits to only exercising intermittently. He continues to have diarrhea from his celiac disease. He does not follow his diet for this. He has also been getting injections  of Aranesp or his anemia of chronic disease. He returns for reevaluation    History 12/14/16 CMMr. [name], 76 year old male returns for follow-up with history of admission to the hospital December 15 with left-sided face arm and leg weakness. He also had some slurring of speech he has a history of carotid artery occlusion stroke in 2001 bilateral carotid endarterectomies cardiac disease hypertension hyperlipidemia and PVD. MRI of the brain showed right PCA territory infarct affecting the posterior medial temporal lobe, occipital lobe and right thalamus. Few small infarctions in the left cerebellum remote right MCA territory infarction., Carotid ultrasound 60-79% right ICA stenosis left 1-39%  Stenosis. Echocardiogram 60-65% EF. CTA of the neck right greater than left ICA bifurcation at hero but no significant stenosis. LDL 33 hemoglobin A1c 5.4 he received IV TPA at [facility] and was then transferred to [facility].Telemetry showed atrial fibrillation  and confirmed by EKG. He was placed on eliquis. He returns to the stroke clinic today for follow-up. He remains on eliquis for secondary stroke prevention along with aspirin. He has not had further stroke or TIA symptoms. He has minimal bruising and no bleeding He lives alone but someone checks on him every day. His daughter lives in [place]. Blood pressure well controlled in the office today 124/46. His blood sugars have been well controlled. He is on pravastatin for hyperlipidemia and he denies muscle aches. He received rehabilitation for 6 weeks in the home after he was discharged. He exercises intermittently and does his home exercise program. He returns for reevaluation   REVIEW OF SYSTEMS: Full 14 system review of systems performed and notable only for those listed, all others are neg:  Constitutional: neg  Cardiovascular: neg Ear/Nose/Throat: neg  Skin: neg Eyes: neg Respiratory: Cough Gastroitestinal: diarrhea Hematology/Lymphatic: easy bruising, anemia Endocrine: neg Musculoskeletal:neg Allergy/Immunology: neg Neurological: neg Psychiatric: neg Sleep : neg   ALLERGIES: No Known Allergies  HOME MEDICATIONS: Outpatient Medications Prior to Visit  Medication Sig Dispense Refill  . acetaminophen (TYLENOL) 500 MG tablet Take 500 mg by mouth every 6 (six) hours as needed.    Marland Kitchen allopurinol (ZYLOPRIM) 100 MG tablet     . amLODipine (NORVASC) 5 MG tablet Take 5 mg by mouth daily.    Marland Kitchen apixaban (ELIQUIS) 5 MG TABS tablet Take 1 tablet (5 mg total) by mouth 2 (two) times daily. 60 tablet 5  . aspirin 81 MG tablet Take 81 mg by mouth daily.    Marland Kitchen atenolol (TENORMIN) 50 MG tablet Take 50 mg by mouth daily.      . colchicine 0.6 MG tablet Take 0.6 mg  by mouth daily.    Marland Kitchen doxazosin (CARDURA) 2 MG tablet Take 2 mg by mouth daily.    . furosemide (LASIX) 40 MG tablet Take 1 tablet (40 mg total) by mouth daily. TAKE 1 AND 1/2 TABLET (60 MG) IN THE AM AND TAKE 1 TABLET (40 MG) IN THE EVENING  225 tablet 2  . HYDROcodone-acetaminophen (NORCO/VICODIN) 5-325 MG tablet Take 1 tablet by mouth every 6 (six) hours as needed for severe pain. 8 tablet 0  . insulin aspart (NOVOLOG) 100 UNIT/ML injection Inject 15 Units into the skin 2 (two) times daily.     . insulin detemir (LEVEMIR) 100 UNIT/ML injection Inject 50 Units into the skin at bedtime.     . Lansoprazole (PREVACID PO) Take by mouth as needed.    Marland Kitchen losartan (COZAAR) 100 MG tablet Take 100 mg by mouth daily.     . pravastatin (PRAVACHOL) 40 MG tablet Take 40 mg by mouth daily.    . vitamin B-12 (CYANOCOBALAMIN) 1000 MCG tablet Take 2 tabs by mouth daily. 60 tablet 5   Facility-Administered Medications Prior to Visit  Medication Dose Route Frequency Provider Last Rate Last Dose  . 0.9 %  sodium chloride infusion   Intravenous Continuous [facility], PA-C 10 mL/hr at 08/31/16 8756    . acetaminophen (TYLENOL) tablet 650 mg  650 mg Oral Once Penland, Kelby Fam, MD        PAST MEDICAL HISTORY: Past Medical History:  Diagnosis Date  . Anemia 06/01/2012   H&H of 10/30 in 8/13 with normal MCV   . Anemia of chronic renal failure, stage 3 (moderate) 06/01/2012   H&H of 10/30 in 8/13 with normal MCV  . Atrial fibrillation ([place])    Documented 08/2016  . B12 deficiency 08/17/2016  . Carotid artery occlusion    Right carotid endarterectomy 2001; left carotid endarterectomy in 2008  . Celiac disease   . Chronic renal disease, stage 3, moderately decreased glomerular filtration rate between 30-59 mL/min/1.73 square meter 06/01/2012  . Coronary atherosclerosis of native coronary artery    PTCA of LAD in 1994; total obstruction of the RCA treated medically; subsequent stress nuclear study with inferior ischemia  . DDD (degenerative disc disease), lumbar    Lumbar surgery in 1984  . Diabetes mellitus type II   . Essential hypertension, benign   . History of stroke   . Hyperlipidemia   . Iron deficiency anemia 12/31/2016  .  Peripheral vascular disease ([place])   . Stroke [facility])    Right brain 2001; right PCA, MCA/PCA, left SCA infarcts 08/2017 with atrial fibrillation    PAST SURGICAL HISTORY: Past Surgical History:  Procedure Laterality Date  . APPENDECTOMY  1970  . CAROTID ENDARTERECTOMY  2008   Left   . CAROTID ENDARTERECTOMY  06/15/2000   Right  . COLONOSCOPY N/A 10/10/2015   Procedure: COLONOSCOPY;  Surgeon: Rogene Houston, MD;  Location: [facility];  Service: Endoscopy;  Laterality: N/A;  12:25 - moved to 11:15 - Ann to notify  . [place] SURGERY  2000, 2001   L5 HNP required 2 surgical procedures    FAMILY HISTORY: Family History  Problem Relation Age of Onset  . Heart disease Mother        Before age 82  . Diabetes Mother   . Heart attack Mother   . Diabetes Sister   . Heart disease Brother   . Diabetes Brother   . Diabetes Daughter   . Diabetes Son   .  Hyperlipidemia Son     SOCIAL HISTORY: Social History   Social History  . Marital status: Widowed    Spouse name: N/A  . Number of children: N/A  . Years of education: N/A   Occupational History  . Not on file.   Social History Main Topics  . Smoking status: Former Smoker    Packs/day: 3.00    Years: 10.00    Types: Cigarettes    Quit date: 09/21/1972  . Smokeless tobacco: Never Used  . Alcohol use No  . Drug use: No  . Sexual activity: Yes    Partners: Male    Birth control/ protection: Post-menopausal   Other Topics Concern  . Not on file   Social History Narrative  . No narrative on file     PHYSICAL EXAM  Vitals:   03/16/17 1417  BP: 114/61  Pulse: 68  Weight: 216 lb 12.8 oz (98.3 kg)  Height: 6' (1.829 m)   Body mass index is 29.4 kg/m.  Generalized: Well developed, in no acute distress  Head: normocephalic and atraumatic,. Oropharynx benign  Neck: Supple, no carotid bruits  Cardiac: Irregularly irregular rate,   Musculoskeletal: No deformity   Neurological examination   Mentation: Alert  oriented to time, place, history taking. Attention span and concentration appropriate. Recent and remote memory intact.  Follows all commands speech and language fluent.   Cranial nerve II-XII: .Pupils were equal round reactive to light extraocular movements were full, visual field were full on confrontational test. Facial sensation and strength were normal. hearing was intact to finger rubbing bilaterally. Uvula tongue midline. head turning and shoulder shrug were normal and symmetric.Tongue protrusion into cheek strength was normal. Motor: normal bulk and tone, full strength in the BUE, BLE, fine finger movements normal, no pronator drift.  Sensory: normal and symmetric to light touch, pinprick, and  Vibration,  in the upper and lower extremities   Coordination: finger-nose-finger, heel-to-shin bilaterally, no dysmetria, no tremor Reflexes: 1+ upper lower and symmetric, plantar responses were flexor bilaterally. Gait and Station: Rising up from seated position without assistance, normal stance,  moderate stride, good arm swing, smooth turning, able to perform tiptoe, and heel walking without difficulty. Tandem gait is mildly unsteady. No assistive device  DIAGNOSTIC DATA (LABS, IMAGING, TESTING) - I reviewed patient records, labs, notes, testing and imaging myself where available.  Lab Results  Component Value Date   WBC 6.7 03/11/2017   HGB 8.6 (L) 03/11/2017   HCT 27.4 (L) 03/11/2017   MCV 96.5 03/11/2017   PLT 144 (L) 03/11/2017      Component Value Date/Time   NA 137 12/31/2016 0826   K 4.4 12/31/2016 0826   CL 104 12/31/2016 0826   CO2 24 12/31/2016 0826   GLUCOSE 145 (H) 12/31/2016 0826   BUN 68 (H) 12/31/2016 0826   CREATININE 2.47 (H) 12/31/2016 0826   CALCIUM 9.1 12/31/2016 0826   PROT 7.7 12/31/2016 0826   ALBUMIN 3.9 12/31/2016 0826   AST 18 12/31/2016 0826   ALT 15 (L) 12/31/2016 0826   ALKPHOS 66 12/31/2016 0826   BILITOT 0.4 12/31/2016 0826   GFRNONAA 24 (L)  12/31/2016 0826   GFRAA 28 (L) 12/31/2016 0826   Lab Results  Component Value Date   CHOL 121 09/05/2016   HDL 37 (L) 09/05/2016   LDLCALC 33 09/05/2016   TRIG 254 (H) 09/05/2016   CHOLHDL 3.3 09/05/2016   Lab Results  Component Value Date   HGBA1C 5.4 09/05/2016  Lab Results  Component Value Date   VITAMINB12 1,481 (H) 12/31/2016   ASSESSMENT AND PLAN  76 y.o. year old male  here for  follow-up  admission  for right PCA territory infarct affecting the posterior medial temporal lobe, occipital lobe and right thalamus. Few small infarctions in the left cerebellum remote right MCA territory infarction. In Dec 2017. New diagnosis of atrial fibrillation while hospitalized in December. Carotid artery occlusion; Celiac disease; Coronary atherosclerosis of native coronary artery;  Diabetes mellitus type II; Essential hypertension, benign; History of stroke; Hyperlipidemia; Peripheral vascular disease (HCC);.here to follow-up for his stroke     PLAN: Stressed the importance of management of risk factors to prevent further stroke Continue Eliquis and aspirin for secondary stroke prevention and atrial fibrillation Maintain strict control of hypertension with blood pressure goal below 130/90, today's reading 114/61,  continue antihypertensive medications Control of diabetes with hemoglobin A1c below 6.5 followed by primary care continue diabetic medications Cholesterol with LDL cholesterol less than 70, followed by primary care,  Continue pravastatin Exercise by walking, slowly increase , eat healthy diet with whole grains,  fresh fruits and vegetables Follow up in 6 months Dennie Bible, [facility], APRN [facility] [address], [place], [place] [address] [phone]

## 2017-03-17 NOTE — Progress Notes (Signed)
I reviewed above note and agree with the assessment and plan.  Rosalin Hawking, MD PhD Stroke Neurology 03/17/2017 7:50 AM

## 2017-03-18 NOTE — Progress Notes (Signed)
Cardiology Office Note  Date: 03/19/2017   ID: Stephen [name], Stephen [name] [birth year], MRN [id number]  PCP: [name], MD  Primary Cardiologist: [name], MD   Chief Complaint  Patient presents with  . Coronary Artery Disease  . Atrial Fibrillation    History of Present Illness: Stephen [name] is a 76 y.o. male last seen in March. He presents for a routine follow-up visit. From a cardiac perspective, he does not report any angina symptoms and has stable dyspnea on exertion. No sense of palpitations.  He continues to follow with [facility] for management of PAD and carotid disease with prior CEA bilaterally. Most recent carotid Dopplers showed less than 40% stenosis at bilateral CEA sites.  He also continues to follow in the Hematology clinic with chronic anemia, tolerating Aranesp with most recent lab work outlined below. Hemoglobin 8.6. He has also had iron infusions and has follow-up lab work pending. He has had some weakness associated with his anemia. No obvious bleeding problems and he has been able to continue on Eliquis for stroke prophylaxis with high CHADSVASC score in the setting of atrial fibrillation and stroke.  Past Medical History:  Diagnosis Date  . Anemia 06/01/2012   H&H of 10/30 in 8/13 with normal MCV   . Anemia of chronic renal failure, stage 3 (moderate) 06/01/2012   H&H of 10/30 in 8/13 with normal MCV  . Atrial fibrillation ([place])    Documented 08/2016  . B12 deficiency 08/17/2016  . Carotid artery occlusion    Right carotid endarterectomy 2001; left carotid endarterectomy in 2008  . Celiac disease   . Chronic renal disease, stage 3, moderately decreased glomerular filtration rate between 30-59 mL/min/1.73 square meter 06/01/2012  . Coronary atherosclerosis of native coronary artery    PTCA of LAD in 1994; total obstruction of the RCA treated medically; subsequent stress nuclear study with inferior ischemia  . DDD (degenerative disc disease), lumbar    Lumbar  surgery in 1984  . Diabetes mellitus type II   . Essential hypertension, benign   . History of stroke   . Hyperlipidemia   . Iron deficiency anemia 12/31/2016  . Peripheral vascular disease ([place])   . Stroke [facility])    Right brain 2001; right PCA, MCA/PCA, left SCA infarcts 08/2017 with atrial fibrillation    Past Surgical History:  Procedure Laterality Date  . APPENDECTOMY  1970  . CAROTID ENDARTERECTOMY  2008   Left   . CAROTID ENDARTERECTOMY  06/15/2000   Right  . COLONOSCOPY N/A 10/10/2015   Procedure: COLONOSCOPY;  Surgeon: Rogene Houston, MD;  [facility];  Service: Endoscopy;  Laterality: N/A;  12:25 - moved to 11:15 - Ann to notify  . [place] SURGERY  2000, 2001   L5 HNP required 2 surgical procedures    Current Outpatient Prescriptions  Medication Sig Dispense Refill  . acetaminophen (TYLENOL) 500 MG tablet Take 500 mg by mouth every 6 (six) hours as needed.    Marland Kitchen allopurinol (ZYLOPRIM) 100 MG tablet     . amLODipine (NORVASC) 5 MG tablet Take 5 mg by mouth daily.    Marland Kitchen apixaban (ELIQUIS) 5 MG TABS tablet Take 1 tablet (5 mg total) by mouth 2 (two) times daily. 60 tablet 5  . aspirin 81 MG tablet Take 81 mg by mouth daily.    Marland Kitchen atenolol (TENORMIN) 50 MG tablet Take 50 mg by mouth daily.      . colchicine 0.6 MG tablet Take 0.6  mg by mouth daily.    Marland Kitchen doxazosin (CARDURA) 2 MG tablet Take 2 mg by mouth daily.    . furosemide (LASIX) 40 MG tablet Take 1 tablet (40 mg total) by mouth daily. TAKE 1 AND 1/2 TABLET (60 MG) IN THE AM AND TAKE 1 TABLET (40 MG) IN THE EVENING 225 tablet 2  . HYDROcodone-acetaminophen (NORCO/VICODIN) 5-325 MG tablet Take 1 tablet by mouth every 6 (six) hours as needed for severe pain. 8 tablet 0  . insulin aspart (NOVOLOG) 100 UNIT/ML injection Inject 15 Units into the skin 2 (two) times daily.     . insulin detemir (LEVEMIR) 100 UNIT/ML injection Inject 50 Units into the skin at bedtime.     . Lansoprazole (PREVACID PO) Take by mouth as  needed.    Marland Kitchen losartan (COZAAR) 100 MG tablet Take 100 mg by mouth daily.     . pravastatin (PRAVACHOL) 40 MG tablet Take 40 mg by mouth daily.    . vitamin B-12 (CYANOCOBALAMIN) 1000 MCG tablet Take 2 tabs by mouth daily. 60 tablet 5   No current facility-administered medications for this visit.    Facility-Administered Medications Ordered in Other Visits  Medication Dose Route Frequency Provider Last Rate Last Dose  . 0.9 %  sodium chloride infusion   Intravenous Continuous Baird Cancer, PA-C 10 mL/hr at 08/31/16 9678    . acetaminophen (TYLENOL) tablet 650 mg  650 mg Oral Once Penland, Kelby Fam, MD       Allergies:  Patient has no known allergies.   Social History: The patient  reports that he quit smoking about 44 years ago. His smoking use included Cigarettes. He has a 30.00 pack-year smoking history. He has never used smokeless tobacco. He reports that he does not drink alcohol or use drugs.   ROS:  Please see the history of present illness. Otherwise, complete review of systems is positive for weakness, hearing loss.  All other systems are reviewed and negative.   Physical Exam: VS:  BP (!) 110/54   Pulse 63   Ht 6' (1.829 m)   Wt 215 lb (97.5 kg)   SpO2 97%   BMI 29.16 kg/m , BMI Body mass index is 29.16 kg/m.  Wt Readings from Last 3 Encounters:  03/19/17 215 lb (97.5 kg)  03/16/17 216 lb 12.8 oz (98.3 kg)  01/22/17 215 lb (97.5 kg)    Overweight elderly male, no distress. HEENT: Conjunctiva and lids normal, oropharynx clear.  Neck: Supple, no elevated JVP or carotid bruits, no thyromegaly.  Lungs: Clear to auscultation, nonlabored breathing at rest.  Cardiac: Irregularly irregular, no obvious S3, soft systolic murmur without diastolic murmur, no pericardial rub.  Abdomen: Soft, nontender, bowel sounds present, no guarding or rebound.  Extremities: 1-2+lower leg edema, distal pulses diminished. Skin: Warm and dry. Musculoskeletal: No  kyphosis. Neuropsychiatric: Alert and oriented 3, affect appropriate.  ECG: I personally reviewed the tracing from 09/04/2016 which showed rate-controlled atrial fibrillation with nonspecific T-wave abnormalities.  Recent Labwork: 12/31/2016: ALT 15; AST 18; BUN 68; Creatinine, Ser 2.47; Potassium 4.4; Sodium 137 03/11/2017: Hemoglobin 8.6; Platelets 144     Component Value Date/Time   CHOL 121 09/05/2016 0252   TRIG 254 (H) 09/05/2016 0252   HDL 37 (L) 09/05/2016 0252   CHOLHDL 3.3 09/05/2016 0252   VLDL 51 (H) 09/05/2016 0252   LDLCALC 33 09/05/2016 0252    Other Studies Reviewed Today:  Echocardiogram 09/05/2016: Study Conclusions  - Left ventricle: The cavity size was  normal. Wall thickness was normal. Systolic function was normal. The estimated ejection fraction was in the range of 60% to 65%. Wall motion was normal; there were no regional wall motion abnormalities. Features are consistent with a pseudonormal left ventricular filling pattern, with concomitant abnormal relaxation and increased filling pressure (grade 2 diastolic dysfunction). - Aortic valve: Mildly calcified annulus. Moderately thickened, moderately calcified leaflets. - Mitral valve: There was mild regurgitation. - Pulmonary arteries: Systolic pressure was moderately increased. PA peak pressure: 51 mm Hg (S).  Lexiscan Cardiolite 10/04/2013: IMPRESSION: Low risk Lexiscan Cardiolite. No diagnostic ST segment abnormalities were noted. Occasional PACs were seen without arrhythmia. Perfusion imaging is consistent with diaphragmatic attenuation, although inferior wall scar cannot be excluded in the face of a potential wall motion abnormality by gated imaging, overall LVEF 51%. No large ischemic zones are noted. LV volumes are normal.  Assessment and Plan:  1. CAD with prior LAD angioplasty and known occlusion of the RCA. He is not reporting angina symptoms on medical therapy, even in the  face of anemia. Last ischemic testing was in 2015. We will continue with observation for now.  2. Chronic anemia, no obvious bleeding however, follows in the Hematology clinic and receiving treatment with Aranesp, also iron infusion. Last hemoglobin 8.6.  3. Persistent atrial fibrillation with CHADSVASC score of 7. Continues on Eliquis for stroke prophylaxis. Heart rate controlled on atenolol.  4. Essential hypertension, blood pressure adequately controlled today.  5. Hyperlipidemia on statin therapy.  Current medicines were reviewed with the patient today.  Disposition: Follow-up in 6 months, sooner if needed.  Signed, Satira Sark, MD, [facility] 03/19/2017 11:14 AM    [place] at [facility] [address], [place],  [address] Phone: [phone]; Fax: [phone]

## 2017-03-19 ENCOUNTER — Ambulatory Visit (INDEPENDENT_AMBULATORY_CARE_PROVIDER_SITE_OTHER): Payer: PPO | Admitting: Cardiology

## 2017-03-19 ENCOUNTER — Encounter: Payer: Self-pay | Admitting: Cardiology

## 2017-03-19 VITALS — BP 110/54 | HR 63 | Ht 72.0 in | Wt 215.0 lb

## 2017-03-19 DIAGNOSIS — E782 Mixed hyperlipidemia: Secondary | ICD-10-CM | POA: Diagnosis not present

## 2017-03-19 DIAGNOSIS — I481 Persistent atrial fibrillation: Secondary | ICD-10-CM

## 2017-03-19 DIAGNOSIS — D508 Other iron deficiency anemias: Secondary | ICD-10-CM | POA: Diagnosis not present

## 2017-03-19 DIAGNOSIS — I2510 Atherosclerotic heart disease of native coronary artery without angina pectoris: Secondary | ICD-10-CM | POA: Diagnosis not present

## 2017-03-19 DIAGNOSIS — I4819 Other persistent atrial fibrillation: Secondary | ICD-10-CM

## 2017-03-19 DIAGNOSIS — I10 Essential (primary) hypertension: Secondary | ICD-10-CM

## 2017-03-19 NOTE — Patient Instructions (Signed)
Your physician wants you to follow-up in: 6 months with Dr.McDowell You will receive a reminder letter in the mail two months in advance. If you don't receive a letter, please call our office to schedule the follow-up appointment.    Your physician recommends that you continue on your current medications as directed. Please refer to the Current Medication list given to you today.    If you need a refill on your cardiac medications before your next appointment, please call your pharmacy.      No testing or labs ordered today.       Thank you for choosing [facility] !           

## 2017-03-25 ENCOUNTER — Encounter (HOSPITAL_COMMUNITY): Payer: PPO

## 2017-03-25 ENCOUNTER — Encounter (HOSPITAL_BASED_OUTPATIENT_CLINIC_OR_DEPARTMENT_OTHER): Payer: PPO

## 2017-03-25 ENCOUNTER — Encounter (HOSPITAL_COMMUNITY): Payer: PPO | Attending: Oncology | Admitting: Adult Health

## 2017-03-25 ENCOUNTER — Encounter (HOSPITAL_COMMUNITY): Payer: Self-pay | Admitting: Adult Health

## 2017-03-25 VITALS — BP 110/36 | HR 76 | Resp 16 | Ht 72.0 in | Wt 215.0 lb

## 2017-03-25 DIAGNOSIS — D631 Anemia in chronic kidney disease: Secondary | ICD-10-CM

## 2017-03-25 DIAGNOSIS — E538 Deficiency of other specified B group vitamins: Secondary | ICD-10-CM

## 2017-03-25 DIAGNOSIS — E876 Hypokalemia: Secondary | ICD-10-CM | POA: Diagnosis not present

## 2017-03-25 DIAGNOSIS — N183 Chronic kidney disease, stage 3 unspecified: Secondary | ICD-10-CM

## 2017-03-25 DIAGNOSIS — R768 Other specified abnormal immunological findings in serum: Secondary | ICD-10-CM | POA: Diagnosis not present

## 2017-03-25 DIAGNOSIS — D508 Other iron deficiency anemias: Secondary | ICD-10-CM | POA: Insufficient documentation

## 2017-03-25 DIAGNOSIS — E1122 Type 2 diabetes mellitus with diabetic chronic kidney disease: Secondary | ICD-10-CM | POA: Diagnosis not present

## 2017-03-25 DIAGNOSIS — N184 Chronic kidney disease, stage 4 (severe): Secondary | ICD-10-CM

## 2017-03-25 DIAGNOSIS — D509 Iron deficiency anemia, unspecified: Secondary | ICD-10-CM

## 2017-03-25 DIAGNOSIS — E875 Hyperkalemia: Secondary | ICD-10-CM

## 2017-03-25 LAB — BASIC METABOLIC PANEL
ANION GAP: 8 (ref 5–15)
BUN: 62 mg/dL — ABNORMAL HIGH (ref 6–20)
CHLORIDE: 104 mmol/L (ref 101–111)
CO2: 23 mmol/L (ref 22–32)
CREATININE: 2.35 mg/dL — AB (ref 0.61–1.24)
Calcium: 9.2 mg/dL (ref 8.9–10.3)
GFR calc non Af Amer: 25 mL/min — ABNORMAL LOW (ref >60)
GFR, EST AFRICAN AMERICAN: 29 mL/min — AB (ref >60)
Glucose, Bld: 135 mg/dL — ABNORMAL HIGH (ref 65–99)
POTASSIUM: 5.8 mmol/L — AB (ref 3.5–5.1)
SODIUM: 135 mmol/L (ref 135–145)

## 2017-03-25 LAB — IRON AND TIBC
Iron: 45 ug/dL (ref 45–182)
Saturation Ratios: 16 % — ABNORMAL LOW (ref 17.9–39.5)
TIBC: 276 ug/dL (ref 250–450)
UIBC: 231 ug/dL

## 2017-03-25 LAB — CBC
HCT: 28 % — ABNORMAL LOW (ref 39.0–52.0)
Hemoglobin: 8.9 g/dL — ABNORMAL LOW (ref 13.0–17.0)
MCH: 30.6 pg (ref 26.0–34.0)
MCHC: 31.8 g/dL (ref 30.0–36.0)
MCV: 96.2 fL (ref 78.0–100.0)
PLATELETS: 179 10*3/uL (ref 150–400)
RBC: 2.91 MIL/uL — ABNORMAL LOW (ref 4.22–5.81)
RDW: 17.1 % — AB (ref 11.5–15.5)
WBC: 6.7 10*3/uL (ref 4.0–10.5)

## 2017-03-25 LAB — FERRITIN: Ferritin: 674 ng/mL — ABNORMAL HIGH (ref 24–336)

## 2017-03-25 LAB — VITAMIN B12: Vitamin B-12: 1241 pg/mL — ABNORMAL HIGH (ref 180–914)

## 2017-03-25 MED ORDER — DARBEPOETIN ALFA 100 MCG/0.5ML IJ SOSY
PREFILLED_SYRINGE | INTRAMUSCULAR | Status: AC
  Filled 2017-03-25: qty 0.5

## 2017-03-25 MED ORDER — DARBEPOETIN ALFA 100 MCG/0.5ML IJ SOSY
100.0000 ug | PREFILLED_SYRINGE | Freq: Once | INTRAMUSCULAR | Status: AC
  Administered 2017-03-25: 100 ug via SUBCUTANEOUS

## 2017-03-25 MED ORDER — DARBEPOETIN ALFA 100 MCG/0.5ML IJ SOSY: 80.0000 ug | PREFILLED_SYRINGE | Freq: Once | INTRAMUSCULAR | Status: DC

## 2017-03-25 MED ORDER — SODIUM POLYSTYRENE SULFONATE 15 GM/60ML PO SUSP: 15.0000 g | Freq: Once | ORAL | 0 refills | Status: AC

## 2017-03-25 NOTE — Progress Notes (Addendum)
[place], [place] [address]   CLINIC:  Medical Oncology/Hematology  PCP:  [name], MD [address] [place] [address] [phone]   REASON FOR VISIT:  Follow-up for Anemia d/t chronic kidney disease AND vitamin B12 deficiency   CURRENT THERAPY: Aranesp 80 mcg injections every 2 weeks AND oral vitamin B12 daily     HISTORY OF PRESENT ILLNESS:  (From [name], PA-C's last note on 12/31/16)       INTERVAL HISTORY:  Stephen [name] 76 y.o. male returns for follow-up for anemia.   Overall, he tells me that he has been feeling weak and tired; energy levels 50%.  Appetite 100%.  He has occasional diarrhea, which he attributes to Celiac disease.  He has bruising, but denies any frank bleeding episodes including blood in his stools, dark/tarry stools, hematuria, nosebleeds, or gingival bleeding.  Denies any unusual bruising to his trunk or abdomen.    Denies any shortness of breath or chest pain. He has chronic LE edema, which has been long-standing and is largely unchanged.   He received a dose of IV iron (ferric gluconate) on 03/03/17.  He tolerated the infusion well, but did not notice a big improvement in his fatigue symptoms.    Shares with me that he has seen Dr. Candiss Norse one time thus far from nephrology and several tests were done. He tells me that he has follow-up with Dr. Candiss Norse scheduled for 04/02/17 where he will get the results of those tests.  His PCP is Dr. [name], and patient sees him pretty often as well.   Otherwise, he is largely without complaints today.    REVIEW OF SYSTEMS:  Review of Systems  Constitutional: Positive for fatigue. Negative for chills, diaphoresis and fever.  HENT:  Negative.  Negative for nosebleeds.   Eyes: Negative.   Respiratory: Negative.  Negative for cough and shortness of breath.   Cardiovascular: Positive for leg swelling.  Gastrointestinal: Positive for diarrhea. Negative for  abdominal pain, blood in stool, constipation, nausea and vomiting.  Endocrine: Negative.   Genitourinary: Negative.  Negative for dysuria and hematuria.   Skin: Negative.   Neurological: Positive for extremity weakness. Negative for dizziness and headaches.  Hematological: Bruises/bleeds easily.  Psychiatric/Behavioral: Negative.      PAST MEDICAL/SURGICAL HISTORY:  Past Medical History:  Diagnosis Date  . Anemia 06/01/2012   H&H of 10/30 in 8/13 with normal MCV   . Anemia of chronic renal failure, stage 3 (moderate) 06/01/2012   H&H of 10/30 in 8/13 with normal MCV  . Atrial fibrillation ([place])    Documented 08/2016  . B12 deficiency 08/17/2016  . Carotid artery occlusion    Right carotid endarterectomy 2001; left carotid endarterectomy in 2008  . Celiac disease   . Chronic renal disease, stage 3, moderately decreased glomerular filtration rate between 30-59 mL/min/1.73 square meter 06/01/2012  . Coronary atherosclerosis of native coronary artery    PTCA of LAD in 1994; total obstruction of the RCA treated medically; subsequent stress nuclear study with inferior ischemia  . DDD (degenerative disc disease), lumbar    Lumbar surgery in 1984  . Diabetes mellitus type II   . Essential hypertension, benign   . History of stroke   . Hyperlipidemia   . Iron deficiency anemia 12/31/2016  . Peripheral vascular disease ([place])   . Stroke [facility])    Right brain 2001; right PCA, MCA/PCA, left SCA infarcts 08/2017 with atrial fibrillation   Past  Surgical History:  Procedure Laterality Date  . APPENDECTOMY  1970  . CAROTID ENDARTERECTOMY  2008   Left   . CAROTID ENDARTERECTOMY  06/15/2000   Right  . COLONOSCOPY N/A 10/10/2015   Procedure: COLONOSCOPY;  Surgeon: Rogene Houston, MD;  Location[facility];  Service: Endoscopy;  Laterality: N/A;  12:25 - moved to 11:15 - Ann to notify  . [place] SURGERY  2000, 2001   L5 HNP required 2 surgical procedures     SOCIAL HISTORY:  Social  History   Social History  . Marital status: Widowed    Spouse name: N/A  . Number of children: N/A  . Years of education: N/A   Occupational History  . Not on file.   Social History Main Topics  . Smoking status: Former Smoker    Packs/day: 3.00    Years: 10.00    Types: Cigarettes    Quit date: 09/21/1972  . Smokeless tobacco: Never Used  . Alcohol use No  . Drug use: No  . Sexual activity: Yes    Partners: Male    Birth control/ protection: Post-menopausal   Other Topics Concern  . Not on file   Social History Narrative  . No narrative on file    FAMILY HISTORY:  Family History  Problem Relation Age of Onset  . Heart disease Mother        Before age 43  . Diabetes Mother   . Heart attack Mother   . Diabetes Sister   . Heart disease Brother   . Diabetes Brother   . Diabetes Daughter   . Diabetes Son   . Hyperlipidemia Son     CURRENT MEDICATIONS:  Outpatient Encounter Prescriptions as of 03/25/2017  Medication Sig  . acetaminophen (TYLENOL) 500 MG tablet Take 500 mg by mouth every 6 (six) hours as needed.  Marland Kitchen allopurinol (ZYLOPRIM) 100 MG tablet   . amLODipine (NORVASC) 5 MG tablet Take 5 mg by mouth daily.  Marland Kitchen apixaban (ELIQUIS) 5 MG TABS tablet Take 1 tablet (5 mg total) by mouth 2 (two) times daily.  Marland Kitchen aspirin 81 MG tablet Take 81 mg by mouth daily.  Marland Kitchen atenolol (TENORMIN) 50 MG tablet Take 50 mg by mouth daily.    . colchicine 0.6 MG tablet Take 0.6 mg by mouth daily.  Marland Kitchen doxazosin (CARDURA) 2 MG tablet Take 2 mg by mouth daily.  . furosemide (LASIX) 40 MG tablet Take 1 tablet (40 mg total) by mouth daily. TAKE 1 AND 1/2 TABLET (60 MG) IN THE AM AND TAKE 1 TABLET (40 MG) IN THE EVENING  . HYDROcodone-acetaminophen (NORCO/VICODIN) 5-325 MG tablet Take 1 tablet by mouth every 6 (six) hours as needed for severe pain.  Marland Kitchen insulin aspart (NOVOLOG) 100 UNIT/ML injection Inject 15 Units into the skin 2 (two) times daily.   . insulin detemir (LEVEMIR) 100 UNIT/ML  injection Inject 50 Units into the skin at bedtime.   . Lansoprazole (PREVACID PO) Take by mouth as needed.  Marland Kitchen losartan (COZAAR) 100 MG tablet Take 100 mg by mouth daily.   . pravastatin (PRAVACHOL) 40 MG tablet Take 40 mg by mouth daily.  . vitamin B-12 (CYANOCOBALAMIN) 1000 MCG tablet Take 2 tabs by mouth daily.  . sodium polystyrene (KAYEXALATE) 15 GM/60ML suspension Take 60 mLs (15 g total) by mouth once.   Facility-Administered Encounter Medications as of 03/25/2017  Medication  . 0.9 %  sodium chloride infusion  . acetaminophen (TYLENOL) tablet 650 mg  ALLERGIES:  No Known Allergies   PHYSICAL EXAM:  ECOG Performance status: 1-2 - Symptomatic; requires occasional assistance.   Vitals:   03/25/17 1011  BP: (!) 110/36  Pulse: 76  Resp: 16   Filed Weights   03/25/17 1011  Weight: 215 lb (97.5 kg)    Physical Exam  Constitutional: He is oriented to person, place, and time and well-developed, well-nourished, and in no distress.  HENT:  Head: Normocephalic.  Mouth/Throat: Oropharynx is clear and moist. No oropharyngeal exudate.  Eyes: Conjunctivae are normal. Pupils are equal, round, and reactive to light. No scleral icterus.  Neck: Normal range of motion. Neck supple.  Cardiovascular: Normal rate.   Irregular rhythm   Pulmonary/Chest: Effort normal and breath sounds normal. No respiratory distress.  Abdominal: Soft. Bowel sounds are normal. There is no tenderness. There is no rebound and no guarding.  Musculoskeletal: Normal range of motion. He exhibits edema (1+ BLE edema ).  Requires assistance onto exam table.   Lymphadenopathy:    He has no cervical adenopathy.       Right: No supraclavicular adenopathy present.       Left: No supraclavicular adenopathy present.  Neurological: He is alert and oriented to person, place, and time. No cranial nerve deficit. Gait normal.  Skin: Skin is warm and dry. No rash noted.  Psychiatric: Mood, memory, affect and judgment  normal.  Nursing note and vitals reviewed.    LABORATORY DATA:  I have reviewed the labs as listed.  CBC    Component Value Date/Time   WBC 6.7 03/25/2017 0938   RBC 2.91 (L) 03/25/2017 0938   HGB 8.9 (L) 03/25/2017 0938   HCT 28.0 (L) 03/25/2017 0938   PLT 179 03/25/2017 0938   MCV 96.2 03/25/2017 0938   MCH 30.6 03/25/2017 0938   MCHC 31.8 03/25/2017 0938   RDW 17.1 (H) 03/25/2017 0938   LYMPHSABS 1.7 12/31/2016 0826   MONOABS 0.6 12/31/2016 0826   EOSABS 0.3 12/31/2016 0826   BASOSABS 0.1 12/31/2016 0826   CMP Latest Ref Rng & Units 03/25/2017 12/31/2016 09/08/2016  Glucose 65 - 99 mg/dL 135(H) 145(H) 141(H)  BUN 6 - 20 mg/dL 62(H) 68(H) 36(H)  Creatinine 0.61 - 1.24 mg/dL 2.35(H) 2.47(H) 1.58(H)  Sodium 135 - 145 mmol/L 135 137 138  Potassium 3.5 - 5.1 mmol/L 5.8(H) 4.4 4.6  Chloride 101 - 111 mmol/L 104 104 103  CO2 22 - 32 mmol/L 23 24 25   Calcium 8.9 - 10.3 mg/dL 9.2 9.1 9.1  Total Protein 6.5 - 8.1 g/dL - 7.7 -  Total Bilirubin 0.3 - 1.2 mg/dL - 0.4 -  Alkaline Phos 38 - 126 U/L - 66 -  AST 15 - 41 U/L - 18 -  ALT 17 - 63 U/L - 15(L) -   Results for Stephen [name], Stephen [name] (MRN [id number])  Ref. Range 03/25/2017 09:38  Iron Latest Ref Range: 45 - 182 ug/dL 45  UIBC Latest Units: ug/dL 231  TIBC Latest Ref Range: 250 - 450 ug/dL 276  Saturation Ratios Latest Ref Range: 17.9 - 39.5 % 16 (L)  Ferritin Latest Ref Range: 24 - 336 ng/mL 674 (H)  Vitamin B12 Latest Ref Range: 180 - 914 pg/mL 1,241 (H)   Results for Stephen [name], Stephen [name] (MRN [id number])   Ref. Range 12/31/2016 08:26  Erythropoietin Latest Ref Range: 2.6 - 18.5 mIU/mL 15.7   Results for Stephen [name], Stephen [name] (MRN [id number])   Ref. Range 08/11/2016 12:38  Total Protein ELP Latest Ref Range:  6.0 - 8.5 g/dL 7.0  Albumin ELP Latest Ref Range: 2.9 - 4.4 g/dL 3.3  Globulin, Total Latest Ref Range: 2.2 - 3.9 g/dL 3.7  A/G Ratio Latest Ref Range: 0.7 - 1.7  0.9  Alpha-1-Globulin Latest Ref Range: 0.0 - 0.4 g/dL 0.3    Alpha-2-Globulin Latest Ref Range: 0.4 - 1.0 g/dL 1.2 (H)  Beta Globulin Latest Ref Range: 0.7 - 1.3 g/dL 1.0  Gamma Globulin Latest Ref Range: 0.4 - 1.8 g/dL 1.3  M-SPIKE, % Latest Ref Range: Not Observed g/dL Not Observed  Results for Stephen [name], Stephen [name] (MRN [id number])   Ref. Range 08/11/2016 12:38  IgG (Immunoglobin G), Serum Latest Ref Range: 700 - 1,600 mg/dL 1,283  IgA Latest Ref Range: 61 - 437 mg/dL 189  IgM, Serum Latest Ref Range: 15 - 143 mg/dL 37  Kappa free light chain Latest Ref Range: 3.3 - 19.4 mg/L 56.8 (H)  Lamda free light chains Latest Ref Range: 5.7 - 26.3 mg/L 25.5  Kappa, lamda light chain ratio Latest Ref Range: 0.26 - 1.65  2.23 (H)   PENDING LABS:    DIAGNOSTIC IMAGING:     PATHOLOGY:       ASSESSMENT & PLAN:   Anemia:  -Likely d/t chronic kidney disease with an element of iron deficiency as well.  -Hgb today 8.9; serial CBCs reviewed and hemoglobin values have been trending down despite Aranesp 80 mcg therapy every 2 weeks. Discussed the pathophysiology of anemia and how kidney disease relates to decreased hemoglobin.  Discussed parameters for blood transfusions, which generally are Hgb < 8 g/dL and/or severe symptoms.  -Discussed with Dr. Talbert Cage. Will increase Aranesp dose to 100 mcg beginning today and every 2 weeks thereafter.  -Iron studies are pending; he did receive IV iron with 1 dose of ferric gluconate on 03/03/17. Symptomatically, he has not noted much improvement in his symptoms with Aranesp or with IV iron.   -Return to cancer center in 3 months for follow-up with labs.    Addendum:  -Iron studies reviewed. Ferritin adequate; no need for additional doses of IV iron.     Elevated kappa/lambda light chains:  -No M-spike on SPEP in 07/2016.  Myeloma labs are pending for today.  -Elevated kappa/lambda light chains thought to be secondary to CKD. Will continue to monitor.   Chronic kidney disease:  -EGFR 25 today, which indicates stage 4 CKD.   -Sees Dr. Candiss Norse with nephrology. Recommended he continue follow-up as directed with nephrology.   Hyperkalemia:  -Serum K 5.8 today; likely secondary to kidney disease and possibly Losartan therapy as well. Encouraged him to maintain Losartan therapy, but he made need anti-hypertensive medication adjustment by his PCP if they feel it is clinically appropriate.  -E-scribed dose of Kayexalate to patient's pharmacy. I gave him instructions on how to use, the purpose of the medication, and side effects including diarrhea.   -Advised him to return next week for labs only to check BMP to ensure potassium has decreased sufficiently.   Vitamin B12 deficiency:  -Continue oral vitamin B12 supplementation daily as directed.         Dispo:  -Return for labs only to recheck BMP in 1 week; orders placed today.  -Continue CBC/Aranesp injection every 2 weeks (standing orders for CBC previously placed).  -Return to cancer center in 3 months for follow-up visit with labs and injection (CBC with diff, CMET, iron studies, kappa/lambda light chains, SPEP/IFE, IgG/IgA/IgM).    All questions were answered to patient's stated satisfaction.  Encouraged patient to call with any new concerns or questions before his next visit to the cancer center and we can certain see him sooner, if needed.    Plan of care discussed with Dr. Talbert Cage, who agrees with the above aforementioned.      Orders placed this encounter:  Orders Placed This Encounter  Procedures  . Basic metabolic panel  . CBC with Differential/Platelet  . Comprehensive metabolic panel  . Ferritin  . Immunofixation electrophoresis  . IgG, IgA, IgM  . Kappa/lambda light chains  . Protein electrophoresis, serum  . Iron and TIBC      Mike Craze, NP [place] [phone]

## 2017-03-25 NOTE — Progress Notes (Signed)
Stephen [name] presents today for injection per the provider's orders.  Aranesp administration without incident; see MAR for injection details.  Patient tolerated procedure well and without incident.  No questions or complaints noted at this time.  Discharged ambulatory.  

## 2017-03-25 NOTE — Patient Instructions (Addendum)
[place] at [facility] Discharge Instructions  RECOMMENDATIONS MADE BY THE CONSULTANT AND ANY TEST RESULTS WILL BE SENT TO YOUR REFERRING PHYSICIAN.  You were seen today by Mike Craze NP. New medication sent to your pharmacy Return in 1 week for labs. Continue labs and aranesp every 2 weeks. Return in 3 months for labs and injection.     Thank you for choosing [place] at [facility] to provide your oncology and hematology care.  To afford each patient quality time with our provider, please arrive at least 15 minutes before your scheduled appointment time.    If you have a lab appointment with the [place] please come in thru the  Main Entrance and check in at the main information desk  You need to re-schedule your appointment should you arrive 10 or more minutes late.  We strive to give you quality time with our providers, and arriving late affects you and other patients whose appointments are after yours.  Also, if you no show three or more times for appointments you may be dismissed from the clinic at the providers discretion.     Again, thank you for choosing [facility].  Our hope is that these requests will decrease the amount of time that you wait before being seen by our physicians.       _____________________________________________________________  Should you have questions after your visit to [facility], please contact our office at [phone] between the hours of 8:30 a.m. and 4:30 p.m.  Voicemails left after 4:30 p.m. will not be returned until the following business day.  For prescription refill requests, have your pharmacy contact our office.       Resources For Cancer Patients and their Caregivers ? American Cancer Society: Can assist with transportation, wigs, general needs, runs Look Good Feel Better.        ([phone] 048-5867 ? Cancer Care: Provides financial assistance, online  support groups, medication/co-pay assistance.  1-800-813-HOPE 641-499-4429) ? [place] Assists [place] cancer patients and their families through emotional , educational and financial support.  801-110-3447 ? [place] DSS Where to apply for food stamps, Medicaid and utility assistance. 573-543-3435 ? [employer]: Transportation to medical appointments. 8020615503 ? Social Security Administration: May apply for disability if have a Stage IV cancer. 701-223-1991 (212)391-5229 ? [employer], Disability and Transit Services: Assists with nutrition, care and transit needs. [place] Support Programs: @10RELATIVEDAYS @ > Cancer Support Group  2nd Tuesday of the month 1pm-2pm, Journey Room  > Creative Journey  3rd Tuesday of the month 1130am-1pm, Journey Room  > Look Good Feel Better  1st Wednesday of the month 10am-12 noon, Journey Room (Call [place] to register [phone])

## 2017-03-26 LAB — IGG, IGA, IGM
IGG (IMMUNOGLOBIN G), SERUM: 1495 mg/dL (ref 700–1600)
IgA: 215 mg/dL (ref 61–437)
IgM, Serum: 37 mg/dL (ref 15–143)

## 2017-03-26 LAB — PROTEIN ELECTROPHORESIS, SERUM
A/G Ratio: 0.8 (ref 0.7–1.7)
ALPHA-1-GLOBULIN: 0.3 g/dL (ref 0.0–0.4)
Albumin ELP: 3.2 g/dL (ref 2.9–4.4)
Alpha-2-Globulin: 1.2 g/dL — ABNORMAL HIGH (ref 0.4–1.0)
Beta Globulin: 1 g/dL (ref 0.7–1.3)
GLOBULIN, TOTAL: 4 g/dL — AB (ref 2.2–3.9)
Gamma Globulin: 1.5 g/dL (ref 0.4–1.8)
TOTAL PROTEIN ELP: 7.2 g/dL (ref 6.0–8.5)

## 2017-03-26 LAB — KAPPA/LAMBDA LIGHT CHAINS
Kappa free light chain: 90.8 mg/L — ABNORMAL HIGH (ref 3.3–19.4)
Kappa, lambda light chain ratio: 2.58 — ABNORMAL HIGH (ref 0.26–1.65)
Lambda free light chains: 35.2 mg/L — ABNORMAL HIGH (ref 5.7–26.3)

## 2017-03-29 LAB — IMMUNOFIXATION ELECTROPHORESIS
IgA: 214 mg/dL (ref 61–437)
IgG (Immunoglobin G), Serum: 1580 mg/dL (ref 700–1600)
IgM, Serum: 38 mg/dL (ref 15–143)
Total Protein ELP: 7.1 g/dL (ref 6.0–8.5)

## 2017-03-30 ENCOUNTER — Telehealth (INDEPENDENT_AMBULATORY_CARE_PROVIDER_SITE_OTHER): Payer: Self-pay | Admitting: *Deleted

## 2017-03-30 NOTE — Telephone Encounter (Signed)
Patient called, wanted to speak to Dr. Laural Golden.  Explained that he was in clinic.  He stated that he is having diarrhea and wanted to see if he would call something in for him to [facility] in [place].  [phone]

## 2017-03-31 ENCOUNTER — Other Ambulatory Visit (INDEPENDENT_AMBULATORY_CARE_PROVIDER_SITE_OTHER): Payer: Self-pay | Admitting: *Deleted

## 2017-03-31 NOTE — Telephone Encounter (Signed)
Talked with the patient. He tells me that he has been taking Imodium liquid form , 2 bottles a week. He denies fever and rectal bleeding.I will address with Dr.Rehman to see if we need to precede with Stool Studies.

## 2017-04-01 ENCOUNTER — Other Ambulatory Visit (INDEPENDENT_AMBULATORY_CARE_PROVIDER_SITE_OTHER): Payer: Self-pay | Admitting: *Deleted

## 2017-04-01 ENCOUNTER — Encounter (HOSPITAL_COMMUNITY): Payer: PPO

## 2017-04-01 DIAGNOSIS — N183 Chronic kidney disease, stage 3 unspecified: Secondary | ICD-10-CM

## 2017-04-01 DIAGNOSIS — R197 Diarrhea, unspecified: Secondary | ICD-10-CM

## 2017-04-01 DIAGNOSIS — N184 Chronic kidney disease, stage 4 (severe): Secondary | ICD-10-CM

## 2017-04-01 DIAGNOSIS — E875 Hyperkalemia: Secondary | ICD-10-CM

## 2017-04-01 DIAGNOSIS — D631 Anemia in chronic kidney disease: Secondary | ICD-10-CM

## 2017-04-01 LAB — BASIC METABOLIC PANEL
Anion gap: 8 (ref 5–15)
BUN: 67 mg/dL — AB (ref 6–20)
CHLORIDE: 106 mmol/L (ref 101–111)
CO2: 22 mmol/L (ref 22–32)
CREATININE: 2.48 mg/dL — AB (ref 0.61–1.24)
Calcium: 8.9 mg/dL (ref 8.9–10.3)
GFR calc Af Amer: 27 mL/min — ABNORMAL LOW (ref >60)
GFR calc non Af Amer: 24 mL/min — ABNORMAL LOW (ref >60)
Glucose, Bld: 108 mg/dL — ABNORMAL HIGH (ref 65–99)
Potassium: 4.9 mmol/L (ref 3.5–5.1)
SODIUM: 136 mmol/L (ref 135–145)

## 2017-04-01 LAB — CBC
HCT: 26.1 % — ABNORMAL LOW (ref 39.0–52.0)
HEMOGLOBIN: 8.1 g/dL — AB (ref 13.0–17.0)
MCH: 30.5 pg (ref 26.0–34.0)
MCHC: 31 g/dL (ref 30.0–36.0)
MCV: 98.1 fL (ref 78.0–100.0)
Platelets: 164 10*3/uL (ref 150–400)
RBC: 2.66 MIL/uL — ABNORMAL LOW (ref 4.22–5.81)
RDW: 16.8 % — ABNORMAL HIGH (ref 11.5–15.5)
WBC: 5.7 10*3/uL (ref 4.0–10.5)

## 2017-04-01 NOTE — Telephone Encounter (Signed)
Per Dr.Rrhman the patient should have GI Pathogen. He should also get on a schedule with the Imodium. Take 1- 2 mg Imodium up to three times a day for diarrhea. Patient was called and made aware and he plans to get the GI pathogen completed tomorrow,04/02/2017.

## 2017-04-02 DIAGNOSIS — N200 Calculus of kidney: Secondary | ICD-10-CM | POA: Diagnosis not present

## 2017-04-02 DIAGNOSIS — E875 Hyperkalemia: Secondary | ICD-10-CM | POA: Diagnosis not present

## 2017-04-02 DIAGNOSIS — D638 Anemia in other chronic diseases classified elsewhere: Secondary | ICD-10-CM | POA: Diagnosis not present

## 2017-04-02 DIAGNOSIS — E559 Vitamin D deficiency, unspecified: Secondary | ICD-10-CM | POA: Diagnosis not present

## 2017-04-02 DIAGNOSIS — D649 Anemia, unspecified: Secondary | ICD-10-CM | POA: Diagnosis not present

## 2017-04-02 DIAGNOSIS — N184 Chronic kidney disease, stage 4 (severe): Secondary | ICD-10-CM | POA: Diagnosis not present

## 2017-04-02 DIAGNOSIS — E872 Acidosis: Secondary | ICD-10-CM | POA: Diagnosis not present

## 2017-04-02 DIAGNOSIS — I959 Hypotension, unspecified: Secondary | ICD-10-CM | POA: Diagnosis not present

## 2017-04-07 ENCOUNTER — Encounter (HOSPITAL_COMMUNITY): Payer: Self-pay

## 2017-04-07 ENCOUNTER — Other Ambulatory Visit (HOSPITAL_COMMUNITY): Payer: Self-pay | Admitting: *Deleted

## 2017-04-07 ENCOUNTER — Encounter (HOSPITAL_COMMUNITY)
Admission: RE | Admit: 2017-04-07 | Discharge: 2017-04-07 | Disposition: A | Discharge status: Home / Self Care | Payer: PPO | Source: Ambulatory Visit | Attending: Nephrology | Admitting: Nephrology

## 2017-04-07 DIAGNOSIS — D631 Anemia in chronic kidney disease: Secondary | ICD-10-CM

## 2017-04-07 DIAGNOSIS — D509 Iron deficiency anemia, unspecified: Secondary | ICD-10-CM | POA: Diagnosis not present

## 2017-04-07 DIAGNOSIS — N183 Chronic kidney disease, stage 3 unspecified: Secondary | ICD-10-CM

## 2017-04-07 MED ORDER — SODIUM CHLORIDE 0.9 % IV SOLN
510.0000 mg | Freq: Once | INTRAVENOUS | Status: AC
  Administered 2017-04-07: 510 mg via INTRAVENOUS
  Filled 2017-04-07: qty 17

## 2017-04-07 MED ORDER — SODIUM CHLORIDE 0.9 % IV SOLN
Freq: Once | INTRAVENOUS | Status: AC
  Administered 2017-04-07: 250 mL via INTRAVENOUS

## 2017-04-08 ENCOUNTER — Encounter (HOSPITAL_COMMUNITY): Payer: PPO

## 2017-04-08 ENCOUNTER — Encounter (HOSPITAL_BASED_OUTPATIENT_CLINIC_OR_DEPARTMENT_OTHER): Payer: PPO

## 2017-04-08 ENCOUNTER — Encounter (HOSPITAL_COMMUNITY): Payer: Self-pay

## 2017-04-08 VITALS — BP 92/49 | HR 69 | Temp 98.1°F | Resp 20

## 2017-04-08 DIAGNOSIS — D631 Anemia in chronic kidney disease: Secondary | ICD-10-CM | POA: Diagnosis not present

## 2017-04-08 DIAGNOSIS — N184 Chronic kidney disease, stage 4 (severe): Secondary | ICD-10-CM

## 2017-04-08 DIAGNOSIS — N183 Chronic kidney disease, stage 3 unspecified: Secondary | ICD-10-CM

## 2017-04-08 LAB — CBC
HEMATOCRIT: 28.2 % — AB (ref 39.0–52.0)
HEMOGLOBIN: 8.9 g/dL — AB (ref 13.0–17.0)
MCH: 30.8 pg (ref 26.0–34.0)
MCHC: 31.6 g/dL (ref 30.0–36.0)
MCV: 97.6 fL (ref 78.0–100.0)
Platelets: 161 10*3/uL (ref 150–400)
RBC: 2.89 MIL/uL — ABNORMAL LOW (ref 4.22–5.81)
RDW: 17 % — ABNORMAL HIGH (ref 11.5–15.5)
WBC: 5.2 10*3/uL (ref 4.0–10.5)

## 2017-04-08 MED ORDER — DARBEPOETIN ALFA 100 MCG/0.5ML IJ SOSY
PREFILLED_SYRINGE | INTRAMUSCULAR | Status: AC
  Filled 2017-04-08: qty 0.5

## 2017-04-08 MED ORDER — DARBEPOETIN ALFA 100 MCG/0.5ML IJ SOSY
100.0000 ug | PREFILLED_SYRINGE | Freq: Once | INTRAMUSCULAR | Status: AC
  Administered 2017-04-08: 100 ug via SUBCUTANEOUS

## 2017-04-08 NOTE — Progress Notes (Signed)
Quin Hoop Hornaday tolerated Aranesop injection well without complaints or incident. Hgb 8.9 today. VSS Pt discharged self ambulatory in satisfactory condition

## 2017-04-08 NOTE — Patient Instructions (Signed)
[facility] at [facility] Discharge Instructions  RECOMMENDATIONS MADE BY THE CONSULTANT AND ANY TEST RESULTS WILL BE SENT TO YOUR REFERRING PHYSICIAN.  Received Aranesp injection today. Follow-up as scheduled. Call clinic for any questions or concerns  Thank you for choosing [facility] at [facility] to provide your oncology and hematology care.  To afford each patient quality time with our provider, please arrive at least 15 minutes before your scheduled appointment time.    If you have a lab appointment with the Cancer Center please come in thru the  Main Entrance and check in at the main information desk  You need to re-schedule your appointment should you arrive 10 or more minutes late.  We strive to give you quality time with our providers, and arriving late affects you and other patients whose appointments are after yours.  Also, if you no show three or more times for appointments you may be dismissed from the clinic at the providers discretion.     Again, thank you for choosing [facility].  Our hope is that these requests will decrease the amount of time that you wait before being seen by our physicians.       _____________________________________________________________  Should you have questions after your visit to [facility], please contact our office at [phone] between the hours of 8:30 a.m. and 4:30 p.m.  Voicemails left after 4:30 p.m. will not be returned until the following business day.  For prescription refill requests, have your pharmacy contact our office.       Resources For Cancer Patients and their Caregivers ? American Cancer Society: Can assist with transportation, wigs, general needs, runs Look Good Feel Better.        1-888-227-6333 ? Cancer Care: Provides financial assistance, online support groups, medication/co-pay assistance.  1-800-813-HOPE (4673) ? [facility] Assists [place] cancer patients and their families through emotional , educational and financial support.  336-427-4357 ? [place] DSS Where to apply for food stamps, Medicaid and utility assistance. 336-342-1394 ? [employer]: Transportation to medical appointments. 336-347-2287 ? Social Security Administration: May apply for disability if have a Stage IV cancer. 336-342-7796 1-800-772-1213 ? [place] Aging, Disability and Transit Services: Assists with nutrition, care and transit needs. 336-349-2343  Cancer Center Support Programs: @10RELATIVEDAYS@ > Cancer Support Group  2nd Tuesday of the month 1pm-2pm, Journey Room  > Creative Journey  3rd Tuesday of the month 1130am-1pm, Journey Room  > Look Good Feel Better  1st Wednesday of the month 10am-12 noon, Journey Room (Call [employer] Cancer [employer] to register 1-800-395-5775)   

## 2017-04-14 ENCOUNTER — Encounter (HOSPITAL_COMMUNITY)
Admission: RE | Admit: 2017-04-14 | Discharge: 2017-04-14 | Disposition: A | Discharge status: Home / Self Care | Payer: PPO | Source: Ambulatory Visit | Attending: Nephrology | Admitting: Nephrology

## 2017-04-14 DIAGNOSIS — D509 Iron deficiency anemia, unspecified: Secondary | ICD-10-CM | POA: Diagnosis not present

## 2017-04-14 MED ORDER — SODIUM CHLORIDE 0.9 % IV SOLN
INTRAVENOUS | Status: DC
  Administered 2017-04-14: 250 mL via INTRAVENOUS

## 2017-04-14 MED ORDER — SODIUM CHLORIDE 0.9 % IV SOLN
510.0000 mg | Freq: Once | INTRAVENOUS | Status: AC
  Administered 2017-04-14: 510 mg via INTRAVENOUS
  Filled 2017-04-14: qty 17

## 2017-04-22 ENCOUNTER — Encounter (HOSPITAL_COMMUNITY): Payer: PPO | Attending: Oncology

## 2017-04-22 ENCOUNTER — Encounter (HOSPITAL_BASED_OUTPATIENT_CLINIC_OR_DEPARTMENT_OTHER): Payer: PPO

## 2017-04-22 ENCOUNTER — Encounter (HOSPITAL_COMMUNITY): Payer: Self-pay

## 2017-04-22 VITALS — BP 95/54 | HR 72 | Temp 98.2°F | Resp 20

## 2017-04-22 DIAGNOSIS — N183 Chronic kidney disease, stage 3 unspecified: Secondary | ICD-10-CM

## 2017-04-22 DIAGNOSIS — N184 Chronic kidney disease, stage 4 (severe): Secondary | ICD-10-CM

## 2017-04-22 DIAGNOSIS — D631 Anemia in chronic kidney disease: Secondary | ICD-10-CM

## 2017-04-22 LAB — CBC
HCT: 28.2 % — ABNORMAL LOW (ref 39.0–52.0)
HEMOGLOBIN: 8.9 g/dL — AB (ref 13.0–17.0)
MCH: 31.3 pg (ref 26.0–34.0)
MCHC: 31.6 g/dL (ref 30.0–36.0)
MCV: 99.3 fL (ref 78.0–100.0)
Platelets: 158 10*3/uL (ref 150–400)
RBC: 2.84 MIL/uL — AB (ref 4.22–5.81)
RDW: 17 % — ABNORMAL HIGH (ref 11.5–15.5)
WBC: 6.6 10*3/uL (ref 4.0–10.5)

## 2017-04-22 MED ORDER — DARBEPOETIN ALFA 100 MCG/0.5ML IJ SOSY
100.0000 ug | PREFILLED_SYRINGE | Freq: Once | INTRAMUSCULAR | Status: AC
  Administered 2017-04-22: 100 ug via SUBCUTANEOUS

## 2017-04-22 MED ORDER — DARBEPOETIN ALFA 100 MCG/0.5ML IJ SOSY
PREFILLED_SYRINGE | INTRAMUSCULAR | Status: AC
  Filled 2017-04-22: qty 0.5

## 2017-04-22 NOTE — Patient Instructions (Signed)
[facility] at [facility] Discharge Instructions  RECOMMENDATIONS MADE BY THE CONSULTANT AND ANY TEST RESULTS WILL BE SENT TO YOUR REFERRING PHYSICIAN.  Received Aranesp injection today. Follow-up as scheduled. Call clinic for any questions or concerns  Thank you for choosing [facility] at [facility] to provide your oncology and hematology care.  To afford each patient quality time with our provider, please arrive at least 15 minutes before your scheduled appointment time.    If you have a lab appointment with the Cancer Center please come in thru the  Main Entrance and check in at the main information desk  You need to re-schedule your appointment should you arrive 10 or more minutes late.  We strive to give you quality time with our providers, and arriving late affects you and other patients whose appointments are after yours.  Also, if you no show three or more times for appointments you may be dismissed from the clinic at the providers discretion.     Again, thank you for choosing [facility].  Our hope is that these requests will decrease the amount of time that you wait before being seen by our physicians.       _____________________________________________________________  Should you have questions after your visit to [facility], please contact our office at [phone] between the hours of 8:30 a.m. and 4:30 p.m.  Voicemails left after 4:30 p.m. will not be returned until the following business day.  For prescription refill requests, have your pharmacy contact our office.       Resources For Cancer Patients and their Caregivers ? American Cancer Society: Can assist with transportation, wigs, general needs, runs Look Good Feel Better.        1-888-227-6333 ? Cancer Care: Provides financial assistance, online support groups, medication/co-pay assistance.  1-800-813-HOPE (4673) ? [facility] Assists [place] cancer patients and their families through emotional , educational and financial support.  336-427-4357 ? [place] DSS Where to apply for food stamps, Medicaid and utility assistance. 336-342-1394 ? [employer]: Transportation to medical appointments. 336-347-2287 ? Social Security Administration: May apply for disability if have a Stage IV cancer. 336-342-7796 1-800-772-1213 ? [place] Aging, Disability and Transit Services: Assists with nutrition, care and transit needs. 336-349-2343  Cancer Center Support Programs: @10RELATIVEDAYS@ > Cancer Support Group  2nd Tuesday of the month 1pm-2pm, Journey Room  > Creative Journey  3rd Tuesday of the month 1130am-1pm, Journey Room  > Look Good Feel Better  1st Wednesday of the month 10am-12 noon, Journey Room (Call [employer] to register 1-800-395-5775)   

## 2017-04-22 NOTE — Progress Notes (Signed)
Stephen [name] tolerated Aranesp injection well without complaints or incident.Hgb 8.9 VSS Pt discharged self ambulatory in satisfactory condition

## 2017-04-26 ENCOUNTER — Other Ambulatory Visit: Payer: Self-pay

## 2017-04-26 MED ORDER — APIXABAN 5 MG PO TABS: 5.0000 mg | ORAL_TABLET | Freq: Two times a day (BID) | ORAL | 3 refills | Status: DC

## 2017-04-26 NOTE — Progress Notes (Signed)
RN sent signed prescription of Eliquis, and attached sign papers for paper assistance. Pt is getting assistance with [facility] with the medication. Information put in the mail with envelope that was provided.

## 2017-05-06 ENCOUNTER — Encounter (HOSPITAL_COMMUNITY): Payer: Self-pay

## 2017-05-06 ENCOUNTER — Encounter (HOSPITAL_BASED_OUTPATIENT_CLINIC_OR_DEPARTMENT_OTHER): Payer: PPO

## 2017-05-06 ENCOUNTER — Encounter (HOSPITAL_COMMUNITY): Payer: PPO

## 2017-05-06 VITALS — BP 107/42 | HR 63 | Temp 98.1°F | Resp 18

## 2017-05-06 DIAGNOSIS — N184 Chronic kidney disease, stage 4 (severe): Secondary | ICD-10-CM

## 2017-05-06 DIAGNOSIS — N183 Chronic kidney disease, stage 3 unspecified: Secondary | ICD-10-CM

## 2017-05-06 DIAGNOSIS — D631 Anemia in chronic kidney disease: Secondary | ICD-10-CM

## 2017-05-06 LAB — CBC
HCT: 29 % — ABNORMAL LOW (ref 39.0–52.0)
Hemoglobin: 9.2 g/dL — ABNORMAL LOW (ref 13.0–17.0)
MCH: 31.3 pg (ref 26.0–34.0)
MCHC: 31.7 g/dL (ref 30.0–36.0)
MCV: 98.6 fL (ref 78.0–100.0)
PLATELETS: 180 10*3/uL (ref 150–400)
RBC: 2.94 MIL/uL — ABNORMAL LOW (ref 4.22–5.81)
RDW: 16.1 % — AB (ref 11.5–15.5)
WBC: 6.1 10*3/uL (ref 4.0–10.5)

## 2017-05-06 MED ORDER — DARBEPOETIN ALFA 100 MCG/0.5ML IJ SOSY
PREFILLED_SYRINGE | INTRAMUSCULAR | Status: AC
  Filled 2017-05-06: qty 0.5

## 2017-05-06 MED ORDER — DARBEPOETIN ALFA 100 MCG/0.5ML IJ SOSY
100.0000 ug | PREFILLED_SYRINGE | Freq: Once | INTRAMUSCULAR | Status: AC
  Administered 2017-05-06: 100 ug via SUBCUTANEOUS

## 2017-05-06 NOTE — Progress Notes (Signed)
Stephen [name] presents today for injection per the provider's orders.  Aranesp administration without incident; see MAR for injection details.  Patient tolerated procedure well and without incident.  No questions or complaints noted at this time.  Discharged ambulatory.  

## 2017-05-20 ENCOUNTER — Encounter (HOSPITAL_BASED_OUTPATIENT_CLINIC_OR_DEPARTMENT_OTHER): Payer: PPO

## 2017-05-20 ENCOUNTER — Encounter (HOSPITAL_COMMUNITY): Payer: Self-pay

## 2017-05-20 ENCOUNTER — Encounter (HOSPITAL_COMMUNITY): Payer: PPO

## 2017-05-20 VITALS — BP 120/67 | HR 68 | Temp 97.9°F | Resp 18

## 2017-05-20 DIAGNOSIS — D631 Anemia in chronic kidney disease: Secondary | ICD-10-CM

## 2017-05-20 DIAGNOSIS — N183 Chronic kidney disease, stage 3 unspecified: Secondary | ICD-10-CM

## 2017-05-20 DIAGNOSIS — N184 Chronic kidney disease, stage 4 (severe): Secondary | ICD-10-CM

## 2017-05-20 LAB — CBC
HEMATOCRIT: 28.1 % — AB (ref 39.0–52.0)
HEMOGLOBIN: 8.9 g/dL — AB (ref 13.0–17.0)
MCH: 31.2 pg (ref 26.0–34.0)
MCHC: 31.7 g/dL (ref 30.0–36.0)
MCV: 98.6 fL (ref 78.0–100.0)
Platelets: 132 10*3/uL — ABNORMAL LOW (ref 150–400)
RBC: 2.85 MIL/uL — ABNORMAL LOW (ref 4.22–5.81)
RDW: 15.6 % — ABNORMAL HIGH (ref 11.5–15.5)
WBC: 5.4 10*3/uL (ref 4.0–10.5)

## 2017-05-20 MED ORDER — DARBEPOETIN ALFA 100 MCG/0.5ML IJ SOSY
100.0000 ug | PREFILLED_SYRINGE | Freq: Once | INTRAMUSCULAR | Status: AC
  Administered 2017-05-20: 100 ug via SUBCUTANEOUS

## 2017-05-20 MED ORDER — DARBEPOETIN ALFA 100 MCG/0.5ML IJ SOSY
PREFILLED_SYRINGE | INTRAMUSCULAR | Status: AC
  Filled 2017-05-20: qty 0.5

## 2017-05-20 NOTE — Progress Notes (Signed)
Stephen [name] tolerated Aranesp injection well without complaints or incident. Hgb 8.9 today and no new problems voiced. VSS Pt discharged self ambulatory in satisfactory condition

## 2017-05-20 NOTE — Patient Instructions (Signed)
[facility] at [facility] Discharge Instructions  RECOMMENDATIONS MADE BY THE CONSULTANT AND ANY TEST RESULTS WILL BE SENT TO YOUR REFERRING PHYSICIAN.  Received Aranesp injection today. Follow-up as scheduled. Call clinic for any questions or concerns  Thank you for choosing [facility] at [facility] to provide your oncology and hematology care.  To afford each patient quality time with our provider, please arrive at least 15 minutes before your scheduled appointment time.    If you have a lab appointment with the Cancer Center please come in thru the  Main Entrance and check in at the main information desk  You need to re-schedule your appointment should you arrive 10 or more minutes late.  We strive to give you quality time with our providers, and arriving late affects you and other patients whose appointments are after yours.  Also, if you no show three or more times for appointments you may be dismissed from the clinic at the providers discretion.     Again, thank you for choosing [facility].  Our hope is that these requests will decrease the amount of time that you wait before being seen by our physicians.       _____________________________________________________________  Should you have questions after your visit to [facility], please contact our office at [phone] between the hours of 8:30 a.m. and 4:30 p.m.  Voicemails left after 4:30 p.m. will not be returned until the following business day.  For prescription refill requests, have your pharmacy contact our office.       Resources For Cancer Patients and their Caregivers ? American Cancer Society: Can assist with transportation, wigs, general needs, runs Look Good Feel Better.        1-888-227-6333 ? Cancer Care: Provides financial assistance, online support groups, medication/co-pay assistance.  1-800-813-HOPE (4673) ? [facility] Assists [place] cancer patients and their families through emotional , educational and financial support.  336-427-4357 ? [place] DSS Where to apply for food stamps, Medicaid and utility assistance. 336-342-1394 ? [employer]: Transportation to medical appointments. 336-347-2287 ? Social Security Administration: May apply for disability if have a Stage IV cancer. 336-342-7796 1-800-772-1213 ? [place] Aging, Disability and Transit Services: Assists with nutrition, care and transit needs. 336-349-2343  Cancer Center Support Programs: @10RELATIVEDAYS@ > Cancer Support Group  2nd Tuesday of the month 1pm-2pm, Journey Room  > Creative Journey  3rd Tuesday of the month 1130am-1pm, Journey Room  > Look Good Feel Better  1st Wednesday of the month 10am-12 noon, Journey Room (Call [employer] to register 1-800-395-5775)   

## 2017-06-03 ENCOUNTER — Encounter (HOSPITAL_BASED_OUTPATIENT_CLINIC_OR_DEPARTMENT_OTHER): Payer: PPO

## 2017-06-03 ENCOUNTER — Encounter (HOSPITAL_COMMUNITY): Payer: PPO | Attending: Oncology

## 2017-06-03 ENCOUNTER — Encounter (HOSPITAL_COMMUNITY): Payer: Self-pay

## 2017-06-03 VITALS — BP 108/49 | HR 77 | Temp 98.2°F | Resp 16

## 2017-06-03 DIAGNOSIS — D631 Anemia in chronic kidney disease: Secondary | ICD-10-CM

## 2017-06-03 DIAGNOSIS — N183 Chronic kidney disease, stage 3 unspecified: Secondary | ICD-10-CM

## 2017-06-03 LAB — CBC
HEMATOCRIT: 28.3 % — AB (ref 39.0–52.0)
Hemoglobin: 9 g/dL — ABNORMAL LOW (ref 13.0–17.0)
MCH: 30.9 pg (ref 26.0–34.0)
MCHC: 31.8 g/dL (ref 30.0–36.0)
MCV: 97.3 fL (ref 78.0–100.0)
Platelets: 170 10*3/uL (ref 150–400)
RBC: 2.91 MIL/uL — ABNORMAL LOW (ref 4.22–5.81)
RDW: 15.2 % (ref 11.5–15.5)
WBC: 6.8 10*3/uL (ref 4.0–10.5)

## 2017-06-03 MED ORDER — DARBEPOETIN ALFA 100 MCG/0.5ML IJ SOSY
100.0000 ug | PREFILLED_SYRINGE | Freq: Once | INTRAMUSCULAR | Status: AC
  Administered 2017-06-03: 100 ug via SUBCUTANEOUS

## 2017-06-03 MED ORDER — DARBEPOETIN ALFA 100 MCG/0.5ML IJ SOSY
PREFILLED_SYRINGE | INTRAMUSCULAR | Status: AC
  Filled 2017-06-03: qty 0.5

## 2017-06-03 NOTE — Progress Notes (Signed)
Pt here today for Aranesp injection.  PT given injection in his left abdomen. Pt tolerated well. Pt stable and discharged home ambulatory.

## 2017-06-09 DIAGNOSIS — Z79899 Other long term (current) drug therapy: Secondary | ICD-10-CM | POA: Diagnosis not present

## 2017-06-09 DIAGNOSIS — E559 Vitamin D deficiency, unspecified: Secondary | ICD-10-CM | POA: Diagnosis not present

## 2017-06-09 DIAGNOSIS — M109 Gout, unspecified: Secondary | ICD-10-CM | POA: Diagnosis not present

## 2017-06-09 DIAGNOSIS — N184 Chronic kidney disease, stage 4 (severe): Secondary | ICD-10-CM | POA: Diagnosis not present

## 2017-06-09 DIAGNOSIS — N183 Chronic kidney disease, stage 3 (moderate): Secondary | ICD-10-CM | POA: Diagnosis not present

## 2017-06-09 DIAGNOSIS — E1129 Type 2 diabetes mellitus with other diabetic kidney complication: Secondary | ICD-10-CM | POA: Diagnosis not present

## 2017-06-09 DIAGNOSIS — I10 Essential (primary) hypertension: Secondary | ICD-10-CM | POA: Diagnosis not present

## 2017-06-09 DIAGNOSIS — R809 Proteinuria, unspecified: Secondary | ICD-10-CM | POA: Diagnosis not present

## 2017-06-09 DIAGNOSIS — D509 Iron deficiency anemia, unspecified: Secondary | ICD-10-CM | POA: Diagnosis not present

## 2017-06-12 DIAGNOSIS — R197 Diarrhea, unspecified: Secondary | ICD-10-CM | POA: Diagnosis not present

## 2017-06-15 DIAGNOSIS — A0472 Enterocolitis due to Clostridium difficile, not specified as recurrent: Secondary | ICD-10-CM | POA: Diagnosis not present

## 2017-06-16 DIAGNOSIS — E872 Acidosis: Secondary | ICD-10-CM | POA: Diagnosis not present

## 2017-06-16 DIAGNOSIS — D649 Anemia, unspecified: Secondary | ICD-10-CM | POA: Diagnosis not present

## 2017-06-16 DIAGNOSIS — D638 Anemia in other chronic diseases classified elsewhere: Secondary | ICD-10-CM | POA: Diagnosis not present

## 2017-06-16 DIAGNOSIS — E875 Hyperkalemia: Secondary | ICD-10-CM | POA: Diagnosis not present

## 2017-06-16 DIAGNOSIS — I959 Hypotension, unspecified: Secondary | ICD-10-CM | POA: Diagnosis not present

## 2017-06-16 DIAGNOSIS — E559 Vitamin D deficiency, unspecified: Secondary | ICD-10-CM | POA: Diagnosis not present

## 2017-06-16 DIAGNOSIS — N200 Calculus of kidney: Secondary | ICD-10-CM | POA: Diagnosis not present

## 2017-06-16 DIAGNOSIS — N184 Chronic kidney disease, stage 4 (severe): Secondary | ICD-10-CM | POA: Diagnosis not present

## 2017-06-17 ENCOUNTER — Encounter (HOSPITAL_COMMUNITY): Payer: PPO

## 2017-06-17 ENCOUNTER — Encounter (HOSPITAL_BASED_OUTPATIENT_CLINIC_OR_DEPARTMENT_OTHER): Payer: PPO

## 2017-06-17 ENCOUNTER — Encounter (HOSPITAL_COMMUNITY): Payer: Self-pay | Admitting: Oncology

## 2017-06-17 ENCOUNTER — Other Ambulatory Visit (HOSPITAL_COMMUNITY): Payer: PPO

## 2017-06-17 ENCOUNTER — Ambulatory Visit (HOSPITAL_COMMUNITY): Payer: PPO | Admitting: Adult Health

## 2017-06-17 ENCOUNTER — Encounter (HOSPITAL_BASED_OUTPATIENT_CLINIC_OR_DEPARTMENT_OTHER): Payer: PPO | Admitting: Oncology

## 2017-06-17 VITALS — BP 103/61 | HR 73 | Resp 16 | Ht 71.0 in | Wt 202.0 lb

## 2017-06-17 DIAGNOSIS — D631 Anemia in chronic kidney disease: Secondary | ICD-10-CM

## 2017-06-17 DIAGNOSIS — N183 Chronic kidney disease, stage 3 unspecified: Secondary | ICD-10-CM

## 2017-06-17 DIAGNOSIS — E538 Deficiency of other specified B group vitamins: Secondary | ICD-10-CM

## 2017-06-17 DIAGNOSIS — N189 Chronic kidney disease, unspecified: Secondary | ICD-10-CM

## 2017-06-17 DIAGNOSIS — D509 Iron deficiency anemia, unspecified: Secondary | ICD-10-CM | POA: Diagnosis not present

## 2017-06-17 LAB — CBC
HEMATOCRIT: 28.4 % — AB (ref 39.0–52.0)
Hemoglobin: 8.9 g/dL — ABNORMAL LOW (ref 13.0–17.0)
MCH: 30.4 pg (ref 26.0–34.0)
MCHC: 31.3 g/dL (ref 30.0–36.0)
MCV: 96.9 fL (ref 78.0–100.0)
Platelets: 160 10*3/uL (ref 150–400)
RBC: 2.93 MIL/uL — ABNORMAL LOW (ref 4.22–5.81)
RDW: 15.7 % — AB (ref 11.5–15.5)
WBC: 4.4 10*3/uL (ref 4.0–10.5)

## 2017-06-17 MED ORDER — DARBEPOETIN ALFA 300 MCG/0.6ML IJ SOSY
300.0000 ug | PREFILLED_SYRINGE | Freq: Once | INTRAMUSCULAR | Status: AC
  Administered 2017-06-17: 300 ug via SUBCUTANEOUS

## 2017-06-17 MED ORDER — DARBEPOETIN ALFA 300 MCG/0.6ML IJ SOSY
PREFILLED_SYRINGE | INTRAMUSCULAR | Status: AC
  Filled 2017-06-17: qty 0.6

## 2017-06-17 NOTE — Progress Notes (Signed)
Stephen [name], Stephen [name] [id number]   CLINIC:  Medical Oncology/Hematology  PCP:  [name], MD [address] [place] [address] [phone]   REASON FOR VISIT:  Follow-up for Anemia d/t chronic kidney disease AND vitamin B12 deficiency   CURRENT THERAPY: Aranesp 80 mcg injections every 2 weeks AND oral vitamin B12 daily     HISTORY OF PRESENT ILLNESS:  (From Stephen Crigler, PA-C's last note on 12/31/16)       INTERVAL HISTORY:  Stephen [name] 76 y.o. male returns for follow-up for anemia.  He states that overall he has been doing well. He denies any fatigue. He denies any recent bleeding including melena, hematuria, hematochezia. He denies any chest pain, shortness breath, abdominal pain. He has chronic lower extremity edema.    REVIEW OF SYSTEMS:  Review of Systems  Constitutional: Negative for chills, diaphoresis, fatigue and fever.  HENT:  Negative.  Negative for nosebleeds.   Eyes: Negative.   Respiratory: Negative.  Negative for cough and shortness of breath.   Cardiovascular: Positive for leg swelling.  Gastrointestinal: Negative for abdominal pain, blood in stool, constipation, diarrhea, nausea and vomiting.  Endocrine: Negative.   Genitourinary: Negative.  Negative for dysuria and hematuria.   Skin: Negative.   Neurological: Negative for dizziness, extremity weakness and headaches.  Hematological: Does not bruise/bleed easily.  Psychiatric/Behavioral: Negative.      PAST MEDICAL/SURGICAL HISTORY:  Past Medical History:  Diagnosis Date  . Anemia 06/01/2012   H&H of 10/30 in 8/13 with normal MCV   . Anemia of chronic renal failure, stage 3 (moderate) 06/01/2012   H&H of 10/30 in 8/13 with normal MCV  . Atrial fibrillation ([place])    Documented 08/2016  . B12 deficiency 08/17/2016  . Carotid artery occlusion    Right carotid endarterectomy 2001; left carotid endarterectomy in 2008  . Celiac disease   . Chronic  renal disease, stage 3, moderately decreased glomerular filtration rate between 30-59 mL/min/1.73 square meter 06/01/2012  . Coronary atherosclerosis of native coronary artery    PTCA of LAD in 1994; total obstruction of the RCA treated medically; subsequent stress nuclear study with inferior ischemia  . DDD (degenerative disc disease), lumbar    Lumbar surgery in 1984  . Diabetes mellitus type II   . Essential hypertension, benign   . History of stroke   . Hyperlipidemia   . Iron deficiency anemia 12/31/2016  . Peripheral vascular disease ([place])   . Stroke [facility])    Right brain 2001; right PCA, MCA/PCA, left SCA infarcts 08/2017 with atrial fibrillation   Past Surgical History:  Procedure Laterality Date  . APPENDECTOMY  1970  . CAROTID ENDARTERECTOMY  2008   Left   . CAROTID ENDARTERECTOMY  06/15/2000   Right  . COLONOSCOPY N/A 10/10/2015   Procedure: COLONOSCOPY;  Surgeon: Stephen Houston, MD;  Location: [facility];  Service: Endoscopy;  Laterality: N/A;  12:25 - moved to 11:15 - Ann to notify  . [place] SURGERY  2000, 2001   L5 HNP required 2 surgical procedures     SOCIAL HISTORY:  Social History   Social History  . Marital status: Widowed    Spouse name: N/A  . Number of children: N/A  . Years of education: N/A   Occupational History  . Not on file.   Social History Main Topics  . Smoking status: Former Smoker    Packs/day: 3.00    Years: 10.00  Types: Cigarettes    Quit date: 09/21/1972  . Smokeless tobacco: Never Used  . Alcohol use No  . Drug use: No  . Sexual activity: Yes    Partners: Male    Birth control/ protection: Post-menopausal   Other Topics Concern  . Not on file   Social History Narrative  . No narrative on file    FAMILY HISTORY:  Family History  Problem Relation Age of Onset  . Heart disease Mother        Before age 72  . Diabetes Mother   . Heart attack Mother   . Diabetes Sister   . Heart disease Brother   . Diabetes  Brother   . Diabetes Daughter   . Diabetes Son   . Hyperlipidemia Son     CURRENT MEDICATIONS:  Outpatient Encounter Prescriptions as of 06/17/2017  Medication Sig  . acetaminophen (TYLENOL) 500 MG tablet Take 500 mg by mouth every 6 (six) hours as needed.  Marland Kitchen allopurinol (ZYLOPRIM) 100 MG tablet 100 mg daily as needed.   Marland Kitchen apixaban (ELIQUIS) 5 MG TABS tablet Take 1 tablet (5 mg total) by mouth 2 (two) times daily.  Marland Kitchen aspirin 81 MG tablet Take 81 mg by mouth daily.  Marland Kitchen atenolol (TENORMIN) 50 MG tablet Take 50 mg by mouth daily.    . colchicine 0.6 MG tablet Take 0.6 mg by mouth daily as needed.   . doxazosin (CARDURA) 2 MG tablet Take 2 mg by mouth daily.  . furosemide (LASIX) 40 MG tablet Take 1 tablet (40 mg total) by mouth daily. TAKE 1 AND 1/2 TABLET (60 MG) IN THE AM AND TAKE 1 TABLET (40 MG) IN THE EVENING  . HYDROcodone-acetaminophen (NORCO/VICODIN) 5-325 MG tablet Take 1 tablet by mouth every 6 (six) hours as needed for severe pain.  Marland Kitchen insulin aspart (NOVOLOG) 100 UNIT/ML injection Inject 15 Units into the skin 2 (two) times daily.   . insulin detemir (LEVEMIR) 100 UNIT/ML injection Inject 50 Units into the skin at bedtime.   . Lansoprazole (PREVACID PO) Take by mouth as needed.  . pravastatin (PRAVACHOL) 40 MG tablet Take 40 mg by mouth daily.  . sodium bicarbonate 650 MG tablet Take 650 mg by mouth 2 (two) times daily.  . vitamin B-12 (CYANOCOBALAMIN) 1000 MCG tablet Take 2 tabs by mouth daily.  Marland Kitchen amLODipine (NORVASC) 5 MG tablet Take 5 mg by mouth daily.  Marland Kitchen losartan (COZAAR) 100 MG tablet Take 100 mg by mouth daily.    Facility-Administered Encounter Medications as of 06/17/2017  Medication  . 0.9 %  sodium chloride infusion  . acetaminophen (TYLENOL) tablet 650 mg    ALLERGIES:  No Known Allergies   PHYSICAL EXAM:  ECOG Performance status: 1-2 - Symptomatic; requires occasional assistance.   Vitals:   06/17/17 0941  BP: 103/61  Pulse: 73  Resp: 16  SpO2: 100%    Filed Weights   06/17/17 0941  Weight: 202 lb (91.6 kg)    Physical Exam  Constitutional: He is oriented to person, place, and time and well-developed, well-nourished, and in no distress.  HENT:  Head: Normocephalic.  Mouth/Throat: Oropharynx is clear and moist. No oropharyngeal exudate.  Eyes: Pupils are equal, round, and reactive to light. Conjunctivae are normal. No scleral icterus.  Neck: Normal range of motion. Neck supple.  Cardiovascular: Normal rate.   Irregular rhythm   Pulmonary/Chest: Effort normal and breath sounds normal. No respiratory distress.  Abdominal: Soft. Bowel sounds are normal. There is no tenderness.  There is no rebound and no guarding.  Musculoskeletal: Normal range of motion. He exhibits edema (1+ BLE edema ).  Lymphadenopathy:    He has no cervical adenopathy.       Right: No supraclavicular adenopathy present.       Left: No supraclavicular adenopathy present.  Neurological: He is alert and oriented to person, place, and time. No cranial nerve deficit. Gait normal.  Skin: Skin is warm and dry. No rash noted.  Psychiatric: Mood, memory, affect and judgment normal.  Nursing note and vitals reviewed.    LABORATORY DATA:  I have reviewed the labs as listed.  CBC    Component Value Date/Time   WBC 4.4 06/17/2017 0903   RBC 2.93 (L) 06/17/2017 0903   HGB 8.9 (L) 06/17/2017 0903   HCT 28.4 (L) 06/17/2017 0903   PLT 160 06/17/2017 0903   MCV 96.9 06/17/2017 0903   MCH 30.4 06/17/2017 0903   MCHC 31.3 06/17/2017 0903   RDW 15.7 (H) 06/17/2017 0903   LYMPHSABS 1.7 12/31/2016 0826   MONOABS 0.6 12/31/2016 0826   EOSABS 0.3 12/31/2016 0826   BASOSABS 0.1 12/31/2016 0826   CMP Latest Ref Rng & Units 04/01/2017 03/25/2017 12/31/2016  Glucose 65 - 99 mg/dL 108(H) 135(H) 145(H)  BUN 6 - 20 mg/dL 67(H) 62(H) 68(H)  Creatinine 0.61 - 1.24 mg/dL 2.48(H) 2.35(H) 2.47(H)  Sodium 135 - 145 mmol/L 136 135 137  Potassium 3.5 - 5.1 mmol/L 4.9 5.8(H) 4.4   Chloride 101 - 111 mmol/L 106 104 104  CO2 22 - 32 mmol/L 22 23 24   Calcium 8.9 - 10.3 mg/dL 8.9 9.2 9.1  Total Protein 6.5 - 8.1 g/dL - - 7.7  Total Bilirubin 0.3 - 1.2 mg/dL - - 0.4  Alkaline Phos 38 - 126 U/L - - 66  AST 15 - 41 U/L - - 18  ALT 17 - 63 U/L - - 15(L)   Results for OSWALD, POTT (MRN [id number])  Ref. Range 03/25/2017 09:38  Iron Latest Ref Range: 45 - 182 ug/dL 45  UIBC Latest Units: ug/dL 231  TIBC Latest Ref Range: 250 - 450 ug/dL 276  Saturation Ratios Latest Ref Range: 17.9 - 39.5 % 16 (L)  Ferritin Latest Ref Range: 24 - 336 ng/mL 674 (H)  Vitamin B12 Latest Ref Range: 180 - 914 pg/mL 1,241 (H)   Results for TOUA, STITES (MRN [id number])   Ref. Range 12/31/2016 08:26  Erythropoietin Latest Ref Range: 2.6 - 18.5 mIU/mL 15.7   Results for JOHNNEY, SCARLATA (MRN [id number])   Ref. Range 08/11/2016 12:38  Total Protein ELP Latest Ref Range: 6.0 - 8.5 g/dL 7.0  Albumin ELP Latest Ref Range: 2.9 - 4.4 g/dL 3.3  Globulin, Total Latest Ref Range: 2.2 - 3.9 g/dL 3.7  A/G Ratio Latest Ref Range: 0.7 - 1.7  0.9  Alpha-1-Globulin Latest Ref Range: 0.0 - 0.4 g/dL 0.3  Alpha-2-Globulin Latest Ref Range: 0.4 - 1.0 g/dL 1.2 (H)  Beta Globulin Latest Ref Range: 0.7 - 1.3 g/dL 1.0  Gamma Globulin Latest Ref Range: 0.4 - 1.8 g/dL 1.3  M-SPIKE, % Latest Ref Range: Not Observed g/dL Not Observed  Results for LUC, SHAMMAS (MRN [id number])   Ref. Range 08/11/2016 12:38  IgG (Immunoglobin G), Serum Latest Ref Range: 700 - 1,600 mg/dL 1,283  IgA Latest Ref Range: 61 - 437 mg/dL 189  IgM, Serum Latest Ref Range: 15 - 143 mg/dL 37  Kappa free light chain Latest Ref Range: 3.3 -  19.4 mg/L 56.8 (H)  Lamda free light chains Latest Ref Range: 5.7 - 26.3 mg/L 25.5  Kappa, lamda light chain ratio Latest Ref Range: 0.26 - 1.65  2.23 (H)   PENDING LABS:    DIAGNOSTIC IMAGING:     PATHOLOGY:       ASSESSMENT & PLAN:   Anemia:  -Likely d/t chronic kidney disease with an  element of iron deficiency as well.  -Hgb today 8.9. Will increase his aranesp to 300 mcg q2 weeks. Goal is to get his hemoglobin close to 11 g/dL. -Return to cancer center in 3 months for follow-up with labs.    Vitamin B12 deficiency:  -Continue oral vitamin B12 supplementation daily as directed.         Dispo:  -Continue CBC/Aranesp injection every 2 weeks. -Return to cancer center in 3 months for follow-up visit with labs.  Orders Placed This Encounter  Procedures  . CBC with Differential    Standing Status:   Future    Standing Expiration Date:   06/17/2018  . Comprehensive metabolic panel    Standing Status:   Future    Standing Expiration Date:   06/17/2018  . Iron and TIBC    Standing Status:   Future    Standing Expiration Date:   06/17/2018  . Ferritin    Standing Status:   Future    Standing Expiration Date:   06/17/2018  . Vitamin B12    Standing Status:   Future    Standing Expiration Date:   06/17/2018     All questions were answered to patient's stated satisfaction. Encouraged patient to call with any new concerns or questions before his next visit to the cancer center and we can certain see him sooner, if needed.    Twana First, MD

## 2017-06-17 NOTE — Progress Notes (Signed)
Stephen [name] [name] presents today for injection per the provider's orders.  Aranesp administration without incident; see MAR for injection details.  Patient tolerated procedure well and without incident.  No questions or complaints noted at this time.  Discharged ambulatory.

## 2017-06-24 DIAGNOSIS — E1122 Type 2 diabetes mellitus with diabetic chronic kidney disease: Secondary | ICD-10-CM | POA: Diagnosis not present

## 2017-06-24 DIAGNOSIS — N184 Chronic kidney disease, stage 4 (severe): Secondary | ICD-10-CM | POA: Diagnosis not present

## 2017-06-24 DIAGNOSIS — Z23 Encounter for immunization: Secondary | ICD-10-CM | POA: Diagnosis not present

## 2017-06-24 DIAGNOSIS — M1A0220 Idiopathic chronic gout, left elbow, without tophus (tophi): Secondary | ICD-10-CM | POA: Diagnosis not present

## 2017-06-29 DIAGNOSIS — K529 Noninfective gastroenteritis and colitis, unspecified: Secondary | ICD-10-CM | POA: Diagnosis not present

## 2017-06-30 DIAGNOSIS — R197 Diarrhea, unspecified: Secondary | ICD-10-CM | POA: Diagnosis not present

## 2017-07-01 ENCOUNTER — Encounter (HOSPITAL_COMMUNITY): Payer: PPO

## 2017-07-01 ENCOUNTER — Other Ambulatory Visit (HOSPITAL_COMMUNITY): Payer: Self-pay | Admitting: Adult Health

## 2017-07-01 ENCOUNTER — Encounter (HOSPITAL_COMMUNITY): Payer: Self-pay

## 2017-07-01 ENCOUNTER — Encounter (HOSPITAL_COMMUNITY): Payer: PPO | Attending: Oncology

## 2017-07-01 VITALS — BP 110/39 | Temp 98.3°F | Resp 18

## 2017-07-01 DIAGNOSIS — D631 Anemia in chronic kidney disease: Secondary | ICD-10-CM | POA: Insufficient documentation

## 2017-07-01 DIAGNOSIS — N183 Chronic kidney disease, stage 3 unspecified: Secondary | ICD-10-CM

## 2017-07-01 DIAGNOSIS — N184 Chronic kidney disease, stage 4 (severe): Secondary | ICD-10-CM

## 2017-07-01 DIAGNOSIS — D509 Iron deficiency anemia, unspecified: Secondary | ICD-10-CM

## 2017-07-01 DIAGNOSIS — E875 Hyperkalemia: Secondary | ICD-10-CM

## 2017-07-01 LAB — COMPREHENSIVE METABOLIC PANEL
ALT: 13 U/L — AB (ref 17–63)
ANION GAP: 12 (ref 5–15)
AST: 13 U/L — ABNORMAL LOW (ref 15–41)
Albumin: 3 g/dL — ABNORMAL LOW (ref 3.5–5.0)
Alkaline Phosphatase: 73 U/L (ref 38–126)
BUN: 82 mg/dL — ABNORMAL HIGH (ref 6–20)
CHLORIDE: 109 mmol/L (ref 101–111)
CO2: 16 mmol/L — AB (ref 22–32)
CREATININE: 3.25 mg/dL — AB (ref 0.61–1.24)
Calcium: 8.7 mg/dL — ABNORMAL LOW (ref 8.9–10.3)
GFR, EST AFRICAN AMERICAN: 20 mL/min — AB (ref >60)
GFR, EST NON AFRICAN AMERICAN: 17 mL/min — AB (ref >60)
Glucose, Bld: 122 mg/dL — ABNORMAL HIGH (ref 65–99)
Potassium: 3.2 mmol/L — ABNORMAL LOW (ref 3.5–5.1)
SODIUM: 137 mmol/L (ref 135–145)
Total Bilirubin: 0.6 mg/dL (ref 0.3–1.2)
Total Protein: 7.8 g/dL (ref 6.5–8.1)

## 2017-07-01 LAB — CBC WITH DIFFERENTIAL/PLATELET
BASOS PCT: 1 %
Basophils Absolute: 0.1 10*3/uL (ref 0.0–0.1)
Eosinophils Absolute: 0.1 10*3/uL (ref 0.0–0.7)
Eosinophils Relative: 1 %
HCT: 30.6 % — ABNORMAL LOW (ref 39.0–52.0)
Hemoglobin: 9.8 g/dL — ABNORMAL LOW (ref 13.0–17.0)
LYMPHS PCT: 29 %
Lymphs Abs: 1.7 10*3/uL (ref 0.7–4.0)
MCH: 29.7 pg (ref 26.0–34.0)
MCHC: 32 g/dL (ref 30.0–36.0)
MCV: 92.7 fL (ref 78.0–100.0)
Monocytes Absolute: 0.4 10*3/uL (ref 0.1–1.0)
Monocytes Relative: 8 %
NEUTROS ABS: 3.5 10*3/uL (ref 1.7–7.7)
Neutrophils Relative %: 61 %
PLATELETS: 224 10*3/uL (ref 150–400)
RBC: 3.3 MIL/uL — ABNORMAL LOW (ref 4.22–5.81)
RDW: 16.3 % — ABNORMAL HIGH (ref 11.5–15.5)
WBC: 5.8 10*3/uL (ref 4.0–10.5)

## 2017-07-01 LAB — IRON AND TIBC
IRON: 30 ug/dL — AB (ref 45–182)
Saturation Ratios: 13 % — ABNORMAL LOW (ref 17.9–39.5)
TIBC: 234 ug/dL — AB (ref 250–450)
UIBC: 204 ug/dL

## 2017-07-01 LAB — FERRITIN: FERRITIN: 666 ng/mL — AB (ref 24–336)

## 2017-07-01 MED ORDER — DARBEPOETIN ALFA 300 MCG/0.6ML IJ SOSY
300.0000 ug | PREFILLED_SYRINGE | Freq: Once | INTRAMUSCULAR | Status: AC
  Administered 2017-07-01: 300 ug via SUBCUTANEOUS
  Filled 2017-07-01: qty 0.6

## 2017-07-01 NOTE — Patient Instructions (Signed)
[place] at [facility] Discharge Instructions  RECOMMENDATIONS MADE BY THE CONSULTANT AND ANY TEST RESULTS WILL BE SENT TO YOUR REFERRING PHYSICIAN.  You received your Aranesp injection today. Your Hgb was 9.8 Follow up in 2 weeks for your next injection   Thank you for choosing [place] at [facility] to provide your oncology and hematology care.  To afford each patient quality time with our provider, please arrive at least 15 minutes before your scheduled appointment time.    If you have a lab appointment with the [facility] please come in thru the  Main Entrance and check in at the main information desk  You need to re-schedule your appointment should you arrive 10 or more minutes late.  We strive to give you quality time with our providers, and arriving late affects you and other patients whose appointments are after yours.  Also, if you no show three or more times for appointments you may be dismissed from the clinic at the providers discretion.     Again, thank you for choosing [facility].  Our hope is that these requests will decrease the amount of time that you wait before being seen by our physicians.       _____________________________________________________________  Should you have questions after your visit to [facility], please contact our office at [phone] between the hours of 8:30 a.m. and 4:30 p.m.  Voicemails left after 4:30 p.m. will not be returned until the following business day.  For prescription refill requests, have your pharmacy contact our office.       Resources For Cancer Patients and their Caregivers ? American Cancer Society: Can assist with transportation, wigs, general needs, runs Look Good Feel Better.        262 202 0984 ? Cancer Care: Provides financial assistance, online support groups, medication/co-pay assistance.  1-800-813-HOPE 5756782461) ? [facility] Assists [place] cancer patients and their families through emotional , educational and financial support.  870-266-0102 ? [place] DSS Where to apply for food stamps, Medicaid and utility assistance. (931) 676-0022 ? [facility]: Transportation to medical appointments. 207-589-3565 ? Social Security Administration: May apply for disability if have a Stage IV cancer. (620) 597-6244 (650)370-3333 ? [employer], Disability and Transit Services: Assists with nutrition, care and transit needs. [place] Support Programs: @10RELATIVEDAYS @ > Cancer Support Group  2nd Tuesday of the month 1pm-2pm, Journey Room  > Creative Journey  3rd Tuesday of the month 1130am-1pm, Journey Room  > Look Good Feel Better  1st Wednesday of the month 10am-12 noon, Journey Room (Call [place] to register [phone])

## 2017-07-01 NOTE — Progress Notes (Unsigned)
Stephen [name] presents today for injection per MD orders. Aranesp 300 mcg administered SQ in left lower abdomen. Administration without incident. Patient tolerated well. Patient tolerated treatment without incidence. Patient discharged via wheelchair and in stable condition from clinic. Patient to follow up as scheduled.

## 2017-07-02 LAB — IGG, IGA, IGM
IGA: 296 mg/dL (ref 61–437)
IGG (IMMUNOGLOBIN G), SERUM: 1818 mg/dL — AB (ref 700–1600)
IgM (Immunoglobulin M), Srm: 38 mg/dL (ref 15–143)

## 2017-07-03 LAB — KAPPA/LAMBDA LIGHT CHAINS
Kappa free light chain: 145.7 mg/L — ABNORMAL HIGH (ref 3.3–19.4)
Kappa, lambda light chain ratio: 2.03 — ABNORMAL HIGH (ref 0.26–1.65)
Lambda free light chains: 71.7 mg/L — ABNORMAL HIGH (ref 5.7–26.3)

## 2017-07-05 ENCOUNTER — Other Ambulatory Visit: Payer: Self-pay

## 2017-07-05 ENCOUNTER — Encounter (HOSPITAL_COMMUNITY): Payer: Self-pay

## 2017-07-05 ENCOUNTER — Inpatient Hospital Stay (HOSPITAL_COMMUNITY)
Admission: EM | Admit: 2017-07-05 | Discharge: 2017-07-09 | DRG: 371 | Disposition: A | Discharge status: Home / Self Care | Payer: PPO | Attending: Internal Medicine | Admitting: Internal Medicine

## 2017-07-05 DIAGNOSIS — E43 Unspecified severe protein-calorie malnutrition: Secondary | ICD-10-CM | POA: Diagnosis not present

## 2017-07-05 DIAGNOSIS — I5032 Chronic diastolic (congestive) heart failure: Secondary | ICD-10-CM | POA: Diagnosis not present

## 2017-07-05 DIAGNOSIS — I130 Hypertensive heart and chronic kidney disease with heart failure and stage 1 through stage 4 chronic kidney disease, or unspecified chronic kidney disease: Secondary | ICD-10-CM | POA: Diagnosis present

## 2017-07-05 DIAGNOSIS — E860 Dehydration: Secondary | ICD-10-CM | POA: Diagnosis not present

## 2017-07-05 DIAGNOSIS — I480 Paroxysmal atrial fibrillation: Secondary | ICD-10-CM | POA: Diagnosis not present

## 2017-07-05 DIAGNOSIS — Z8249 Family history of ischemic heart disease and other diseases of the circulatory system: Secondary | ICD-10-CM

## 2017-07-05 DIAGNOSIS — R9431 Abnormal electrocardiogram [ECG] [EKG]: Secondary | ICD-10-CM | POA: Diagnosis not present

## 2017-07-05 DIAGNOSIS — E1122 Type 2 diabetes mellitus with diabetic chronic kidney disease: Secondary | ICD-10-CM | POA: Diagnosis present

## 2017-07-05 DIAGNOSIS — Z794 Long term (current) use of insulin: Secondary | ICD-10-CM | POA: Diagnosis not present

## 2017-07-05 DIAGNOSIS — I481 Persistent atrial fibrillation: Secondary | ICD-10-CM | POA: Diagnosis not present

## 2017-07-05 DIAGNOSIS — E876 Hypokalemia: Secondary | ICD-10-CM | POA: Diagnosis present

## 2017-07-05 DIAGNOSIS — Z7982 Long term (current) use of aspirin: Secondary | ICD-10-CM

## 2017-07-05 DIAGNOSIS — I739 Peripheral vascular disease, unspecified: Secondary | ICD-10-CM

## 2017-07-05 DIAGNOSIS — K909 Intestinal malabsorption, unspecified: Secondary | ICD-10-CM | POA: Diagnosis not present

## 2017-07-05 DIAGNOSIS — Z79899 Other long term (current) drug therapy: Secondary | ICD-10-CM

## 2017-07-05 DIAGNOSIS — I2510 Atherosclerotic heart disease of native coronary artery without angina pectoris: Secondary | ICD-10-CM | POA: Diagnosis not present

## 2017-07-05 DIAGNOSIS — A0472 Enterocolitis due to Clostridium difficile, not specified as recurrent: Secondary | ICD-10-CM | POA: Diagnosis not present

## 2017-07-05 DIAGNOSIS — N184 Chronic kidney disease, stage 4 (severe): Secondary | ICD-10-CM | POA: Diagnosis present

## 2017-07-05 DIAGNOSIS — R197 Diarrhea, unspecified: Secondary | ICD-10-CM | POA: Diagnosis not present

## 2017-07-05 DIAGNOSIS — I4821 Permanent atrial fibrillation: Secondary | ICD-10-CM

## 2017-07-05 DIAGNOSIS — E1151 Type 2 diabetes mellitus with diabetic peripheral angiopathy without gangrene: Secondary | ICD-10-CM | POA: Diagnosis not present

## 2017-07-05 DIAGNOSIS — I5031 Acute diastolic (congestive) heart failure: Secondary | ICD-10-CM

## 2017-07-05 DIAGNOSIS — Z87891 Personal history of nicotine dependence: Secondary | ICD-10-CM

## 2017-07-05 DIAGNOSIS — Z7901 Long term (current) use of anticoagulants: Secondary | ICD-10-CM | POA: Diagnosis not present

## 2017-07-05 DIAGNOSIS — K649 Unspecified hemorrhoids: Secondary | ICD-10-CM | POA: Diagnosis present

## 2017-07-05 DIAGNOSIS — Z6825 Body mass index (BMI) 25.0-25.9, adult: Secondary | ICD-10-CM

## 2017-07-05 DIAGNOSIS — Z8673 Personal history of transient ischemic attack (TIA), and cerebral infarction without residual deficits: Secondary | ICD-10-CM

## 2017-07-05 DIAGNOSIS — E538 Deficiency of other specified B group vitamins: Secondary | ICD-10-CM

## 2017-07-05 DIAGNOSIS — I10 Essential (primary) hypertension: Secondary | ICD-10-CM | POA: Diagnosis not present

## 2017-07-05 DIAGNOSIS — R634 Abnormal weight loss: Secondary | ICD-10-CM | POA: Diagnosis present

## 2017-07-05 DIAGNOSIS — E785 Hyperlipidemia, unspecified: Secondary | ICD-10-CM | POA: Diagnosis present

## 2017-07-05 DIAGNOSIS — K529 Noninfective gastroenteritis and colitis, unspecified: Secondary | ICD-10-CM | POA: Diagnosis not present

## 2017-07-05 DIAGNOSIS — I482 Chronic atrial fibrillation: Secondary | ICD-10-CM | POA: Diagnosis not present

## 2017-07-05 DIAGNOSIS — Z833 Family history of diabetes mellitus: Secondary | ICD-10-CM

## 2017-07-05 DIAGNOSIS — N179 Acute kidney failure, unspecified: Secondary | ICD-10-CM | POA: Diagnosis not present

## 2017-07-05 DIAGNOSIS — A040 Enteropathogenic Escherichia coli infection: Secondary | ICD-10-CM | POA: Diagnosis present

## 2017-07-05 LAB — URINALYSIS, ROUTINE W REFLEX MICROSCOPIC
BACTERIA UA: NONE SEEN
BILIRUBIN URINE: NEGATIVE
Glucose, UA: NEGATIVE mg/dL
Ketones, ur: NEGATIVE mg/dL
LEUKOCYTES UA: NEGATIVE
NITRITE: NEGATIVE
PH: 5 (ref 5.0–8.0)
Protein, ur: NEGATIVE mg/dL
SPECIFIC GRAVITY, URINE: 1.011 (ref 1.005–1.030)

## 2017-07-05 LAB — COMPREHENSIVE METABOLIC PANEL
ALBUMIN: 3.1 g/dL — AB (ref 3.5–5.0)
ALK PHOS: 76 U/L (ref 38–126)
ALT: 13 U/L — AB (ref 17–63)
AST: 15 U/L (ref 15–41)
Anion gap: 9 (ref 5–15)
BILIRUBIN TOTAL: 0.2 mg/dL — AB (ref 0.3–1.2)
BUN: 72 mg/dL — AB (ref 6–20)
CALCIUM: 8.6 mg/dL — AB (ref 8.9–10.3)
CO2: 16 mmol/L — ABNORMAL LOW (ref 22–32)
CREATININE: 3.13 mg/dL — AB (ref 0.61–1.24)
Chloride: 112 mmol/L — ABNORMAL HIGH (ref 101–111)
GFR calc Af Amer: 21 mL/min — ABNORMAL LOW (ref >60)
GFR, EST NON AFRICAN AMERICAN: 18 mL/min — AB (ref >60)
GLUCOSE: 127 mg/dL — AB (ref 65–99)
Potassium: 3.2 mmol/L — ABNORMAL LOW (ref 3.5–5.1)
Sodium: 137 mmol/L (ref 135–145)
TOTAL PROTEIN: 7.5 g/dL (ref 6.5–8.1)

## 2017-07-05 LAB — C DIFFICILE QUICK SCREEN W PCR REFLEX
C DIFFICILE (CDIFF) TOXIN: NEGATIVE
C DIFFICLE (CDIFF) ANTIGEN: NEGATIVE
C Diff interpretation: NOT DETECTED

## 2017-07-05 LAB — IMMUNOFIXATION ELECTROPHORESIS
IGG (IMMUNOGLOBIN G), SERUM: 1856 mg/dL — AB (ref 700–1600)
IgA: 293 mg/dL (ref 61–437)
IgM (Immunoglobulin M), Srm: 37 mg/dL (ref 15–143)
Total Protein ELP: 7.2 g/dL (ref 6.0–8.5)

## 2017-07-05 LAB — PROTEIN ELECTROPHORESIS, SERUM
A/G RATIO SPE: 0.6 — AB (ref 0.7–1.7)
ALBUMIN ELP: 2.8 g/dL — AB (ref 2.9–4.4)
ALPHA-1-GLOBULIN: 0.4 g/dL (ref 0.0–0.4)
Alpha-2-Globulin: 1.3 g/dL — ABNORMAL HIGH (ref 0.4–1.0)
Beta Globulin: 0.9 g/dL (ref 0.7–1.3)
GLOBULIN, TOTAL: 4.4 g/dL — AB (ref 2.2–3.9)
Gamma Globulin: 1.8 g/dL (ref 0.4–1.8)
Total Protein ELP: 7.2 g/dL (ref 6.0–8.5)

## 2017-07-05 LAB — GLUCOSE, CAPILLARY: GLUCOSE-CAPILLARY: 128 mg/dL — AB (ref 65–99)

## 2017-07-05 LAB — CK: CK TOTAL: 43 U/L — AB (ref 49–397)

## 2017-07-05 LAB — CBC
HEMATOCRIT: 29.8 % — AB (ref 39.0–52.0)
Hemoglobin: 9.5 g/dL — ABNORMAL LOW (ref 13.0–17.0)
MCH: 29.9 pg (ref 26.0–34.0)
MCHC: 31.9 g/dL (ref 30.0–36.0)
MCV: 93.7 fL (ref 78.0–100.0)
PLATELETS: 249 10*3/uL (ref 150–400)
RBC: 3.18 MIL/uL — ABNORMAL LOW (ref 4.22–5.81)
RDW: 16.3 % — AB (ref 11.5–15.5)
WBC: 6.6 10*3/uL (ref 4.0–10.5)

## 2017-07-05 LAB — LIPASE, BLOOD: Lipase: 54 U/L — ABNORMAL HIGH (ref 11–51)

## 2017-07-05 MED ORDER — POTASSIUM CHLORIDE IN NACL 40-0.9 MEQ/L-% IV SOLN
INTRAVENOUS | Status: DC
  Administered 2017-07-05 – 2017-07-07 (×3): 75 mL/h via INTRAVENOUS

## 2017-07-05 MED ORDER — INSULIN ASPART 100 UNIT/ML ~~LOC~~ SOLN
0.0000 [IU] | Freq: Three times a day (TID) | SUBCUTANEOUS | Status: DC
  Administered 2017-07-06 – 2017-07-08 (×5): 2 [IU] via SUBCUTANEOUS

## 2017-07-05 MED ORDER — PANTOPRAZOLE SODIUM 40 MG PO TBEC
40.0000 mg | DELAYED_RELEASE_TABLET | Freq: Every day | ORAL | Status: DC
  Administered 2017-07-05 – 2017-07-07 (×3): 40 mg via ORAL
  Filled 2017-07-05 (×3): qty 1

## 2017-07-05 MED ORDER — ACETAMINOPHEN 650 MG RE SUPP: 650.0000 mg | Freq: Four times a day (QID) | RECTAL | Status: DC | PRN

## 2017-07-05 MED ORDER — VITAMIN D 1000 UNITS PO TABS
1000.0000 [IU] | ORAL_TABLET | Freq: Every day | ORAL | Status: DC
  Administered 2017-07-06 – 2017-07-09 (×4): 1000 [IU] via ORAL
  Filled 2017-07-05 (×4): qty 1

## 2017-07-05 MED ORDER — ONDANSETRON HCL 4 MG PO TABS: 4.0000 mg | ORAL_TABLET | Freq: Four times a day (QID) | ORAL | Status: DC | PRN

## 2017-07-05 MED ORDER — ENSURE ENLIVE PO LIQD
237.0000 mL | Freq: Two times a day (BID) | ORAL | Status: DC
  Administered 2017-07-06 – 2017-07-07 (×3): 237 mL via ORAL

## 2017-07-05 MED ORDER — INSULIN DETEMIR 100 UNIT/ML ~~LOC~~ SOLN
25.0000 [IU] | Freq: Every day | SUBCUTANEOUS | Status: DC
  Administered 2017-07-05 – 2017-07-07 (×3): 25 [IU] via SUBCUTANEOUS
  Filled 2017-07-05 (×4): qty 0.25

## 2017-07-05 MED ORDER — INSULIN ASPART 100 UNIT/ML ~~LOC~~ SOLN: 0.0000 [IU] | Freq: Every day | SUBCUTANEOUS | Status: DC

## 2017-07-05 MED ORDER — ACETAMINOPHEN 325 MG PO TABS
650.0000 mg | ORAL_TABLET | Freq: Four times a day (QID) | ORAL | Status: DC | PRN
  Administered 2017-07-06: 650 mg via ORAL
  Filled 2017-07-05: qty 2

## 2017-07-05 MED ORDER — ONDANSETRON HCL 4 MG/2ML IJ SOLN: 4.0000 mg | Freq: Four times a day (QID) | INTRAMUSCULAR | Status: DC | PRN

## 2017-07-05 MED ORDER — SODIUM BICARBONATE 650 MG PO TABS
650.0000 mg | ORAL_TABLET | Freq: Three times a day (TID) | ORAL | Status: DC
  Administered 2017-07-05 – 2017-07-09 (×11): 650 mg via ORAL
  Filled 2017-07-05 (×11): qty 1

## 2017-07-05 MED ORDER — APIXABAN 5 MG PO TABS
5.0000 mg | ORAL_TABLET | Freq: Two times a day (BID) | ORAL | Status: DC
  Administered 2017-07-05 – 2017-07-09 (×8): 5 mg via ORAL
  Filled 2017-07-05 (×8): qty 1

## 2017-07-05 MED ORDER — VITAMIN B-12 1000 MCG PO TABS
2000.0000 ug | ORAL_TABLET | Freq: Every day | ORAL | Status: DC
  Administered 2017-07-06 – 2017-07-09 (×4): 2000 ug via ORAL
  Filled 2017-07-05 (×4): qty 2

## 2017-07-05 MED ORDER — ALLOPURINOL 100 MG PO TABS
200.0000 mg | ORAL_TABLET | Freq: Every day | ORAL | Status: DC
  Administered 2017-07-06 – 2017-07-09 (×4): 200 mg via ORAL
  Filled 2017-07-05 (×4): qty 2

## 2017-07-05 MED ORDER — ASPIRIN 81 MG PO CHEW
81.0000 mg | CHEWABLE_TABLET | Freq: Every day | ORAL | Status: DC
  Administered 2017-07-06 – 2017-07-09 (×4): 81 mg via ORAL
  Filled 2017-07-05 (×4): qty 1

## 2017-07-05 MED ORDER — SODIUM CHLORIDE 0.9 % IV BOLUS (SEPSIS)
1000.0000 mL | Freq: Once | INTRAVENOUS | Status: AC
  Administered 2017-07-05: 1000 mL via INTRAVENOUS

## 2017-07-05 MED ORDER — PRAVASTATIN SODIUM 40 MG PO TABS
40.0000 mg | ORAL_TABLET | Freq: Every day | ORAL | Status: DC
  Administered 2017-07-06 – 2017-07-08 (×3): 40 mg via ORAL
  Filled 2017-07-05 (×3): qty 1

## 2017-07-05 NOTE — H&P (Signed)
History and Physical  Stephen [name] WFU:[id number] DOB: 03-22-1941 DOA: 07/05/2017   PCP: [name], MD   Patient coming from: Home  Chief Complaint: diarrhea  HPI:  Stephen [name] is a 76 y.o. male with medical history of diabetes mellitus, hypertension,persistent atrial fibrillation on apixiban, and CKD stage IV presenting with diarrhea for the better part of the past month. The patient was diagnosed with C. Difficile colitis approximately 3 weeks prior to this admission. The patient was given a ten-day course of po vancomycin which he finished. He stated that his diarrhea never improved all that much. The patient had a second stool test performed approximately 10 days prior to this admission. Per the patient's report, the second stool test was negative for C. Difficile. However because of concerns of possible persistent infection, the patient was given a second prolonged tapering course of po vancomycin. The patient started the second course of oral vancomycin on 07/01/2017. The patient denies any recent travels or sick contacts. He denies eating any raw or undercooked or exotic foods. He denies any unusual pets. Despite starting the second course of po vancomycin, the patient continued to have diarrhea. He went to see his PCP at the office today. He was noted to have low blood pressures. As result, the patient was sent to the emergency department for further evaluation.  In the emergency department, the patient was afebrile and hemodynamically stable with blood pressure 100/58. WBC was 6.6. His serum creatinine was 3.13 which is above his usual baseline. Hemoglobin was 9.5 which is around his baseline. The patient was given 1 L normal saline.  Assessment/Plan: Acute on chronic renal failure--CKD stage IV -secondary to volume depletion -Baseline creatinine 2.3-2.4 -Serum creatinine peaked at 3.25 on 07/01/2017 -Start IV fluids -holding furosemide -Check CK -continue home dose of  bicarbonate  Diarrhea -The patient states that his second C. Difficile test was negative -Repeat C. Difficile -Check stool pathogen panel -Stool lactoferrin -Consult GI  Permanent atrial fibrillation -Rate controlled -Continue apixiban  Hypertension -Holding atenolol and Cardura secondary to soft blood pressures  Diabetes mellitus type 2 -Hemoglobin A1c -Reduced dose of Levemir -NovoLog sliding scale  Coronary artery disease -No anginal symptoms -Continue aspirin -Status post angioplasty RCA  Hyperlipidemia -Continue statin  Hypokalemia -Check magnesium -Replete       Past Medical History:  Diagnosis Date  . Anemia 06/01/2012   H&H of 10/30 in 8/13 with normal MCV   . Anemia of chronic renal failure, stage 3 (moderate) (HCC) 06/01/2012   H&H of 10/30 in 8/13 with normal MCV  . Atrial fibrillation ([place])    Documented 08/2016  . B12 deficiency 08/17/2016  . Carotid artery occlusion    Right carotid endarterectomy 2001; left carotid endarterectomy in 2008  . Celiac disease   . Chronic renal disease, stage 3, moderately decreased glomerular filtration rate between 30-59 mL/min/1.73 square meter ([place]) 06/01/2012  . Coronary atherosclerosis of native coronary artery    PTCA of LAD in 1994; total obstruction of the RCA treated medically; subsequent stress nuclear study with inferior ischemia  . DDD (degenerative disc disease), lumbar    Lumbar surgery in 1984  . Diabetes mellitus type II   . Essential hypertension, benign   . History of stroke   . Hyperlipidemia   . Iron deficiency anemia 12/31/2016  . Peripheral vascular disease ([place])   . Stroke [facility])    Right brain 2001; right PCA, MCA/PCA, left SCA infarcts 08/2017 with atrial  fibrillation   Past Surgical History:  Procedure Laterality Date  . APPENDECTOMY  1970  . CAROTID ENDARTERECTOMY  2008   Left   . CAROTID ENDARTERECTOMY  06/15/2000   Right  . COLONOSCOPY N/A 10/10/2015   Procedure: COLONOSCOPY;   Surgeon: Rogene Houston, MD;  Location: [facility] ENDO SU[facility];  Service: Endoscopy;  Laterality: N/A;  12:25 - moved to 11:15 - Ann to notify  . [place] SURGERY  2000, 2001   L5 HNP required 2 surgical procedures   Social History:  reports that he quit smoking about 44 years ago. His smoking use included Cigarettes. He has a 30.00 pack-year smoking history. He has never used smokeless tobacco. He reports that he does not drink alcohol or use drugs.   Family History  Problem Relation Age of Onset  . Heart disease Mother        Before age 29  . Diabetes Mother   . Heart attack Mother   . Diabetes Sister   . Heart disease Brother   . Diabetes Brother   . Diabetes Daughter   . Diabetes Son   . Hyperlipidemia Son      No Known Allergies   Prior to Admission medications   Medication Sig Start Date End Date Taking? Authorizing Provider  acetaminophen (TYLENOL) 500 MG tablet Take 500 mg by mouth every 6 (six) hours as needed.   Yes [provider]  allopurinol (ZYLOPRIM) 100 MG tablet Take 200 mg by mouth daily.  12/16/16  Yes [provider]  apixaban (ELIQUIS) 5 MG TABS tablet Take 1 tablet (5 mg total) by mouth 2 (two) times daily. 04/26/17  Yes Rosalin Hawking, MD  aspirin 81 MG tablet Take 81 mg by mouth daily.   Yes [provider]  atenolol (TENORMIN) 50 MG tablet Take 50 mg by mouth daily.     Yes [provider]  Cholecalciferol (VITAMIN D-3) 1000 units CAPS Take 1 capsule by mouth daily.   Yes [provider]  doxazosin (CARDURA) 2 MG tablet Take 2 mg by mouth daily.   Yes [provider]  furosemide (LASIX) 40 MG tablet Take 1 tablet (40 mg total) by mouth daily. TAKE 1 AND 1/2 TABLET (60 MG) IN THE AM AND TAKE 1 TABLET (40 MG) IN THE EVENING 09/08/16  Yes Rosalin Hawking, MD  insulin aspart (NOVOLOG) 100 UNIT/ML injection Inject 15 Units into the skin 2 (two) times daily.    Yes [provider]  insulin detemir (LEVEMIR) 100  UNIT/ML injection Inject 50 Units into the skin at bedtime.    Yes [provider]  Lansoprazole (PREVACID PO) Take 1 capsule by mouth daily.    Yes [provider]  pravastatin (PRAVACHOL) 40 MG tablet Take 40 mg by mouth daily.   Yes [provider]  Probiotic Product (PROBIOTIC PO) Take 1 capsule by mouth daily.   Yes [provider]  sodium bicarbonate 650 MG tablet Take 650 mg by mouth 3 (three) times daily.    Yes [provider]  vancomycin (VANCOCIN) 125 MG capsule Take 125 mg by mouth every other day.   Yes [provider]  vitamin B-12 (CYANOCOBALAMIN) 1000 MCG tablet Take 2 tabs by mouth daily. 11/05/16  Yes Twana First, MD    Review of Systems:  Constitutional:  No weight loss, night sweats, Fevers, chills, fatigue.  Head&Eyes: No headache.  No vision loss.  No eye pain or scotoma ENT:  No Difficulty swallowing,Tooth/dental problems,Sore throat,  No ear ache, post nasal drip,  Cardio-vascular:  No chest pain, Orthopnea, PND, swelling in lower extremities,  dizziness, palpitations  GI:  No  abdominal pain, nausea, vomiting,  loss of appetite, hematochezia, melena, heartburn, indigestion, Resp:  No shortness of breath with exertion or at rest. No cough. No coughing up of blood .No wheezing.No chest wall deformity  Skin:  no rash or lesions.  GU:  no dysuria, change in color of urine, no urgency or frequency. No flank pain.  Musculoskeletal:  No joint pain or swelling. No decreased range of motion. No back pain.  Psych:  No change in mood or affect. No depression or anxiety. Neurologic: No headache, no dysesthesia, no focal weakness, no vision loss. No syncope  Physical Exam: Vitals:   07/05/17 1251 07/05/17 1252  BP: (!) 100/58   Pulse: 79   Resp: 17   Temp: 97.6 F (36.4 C)   TempSrc: Oral   SpO2: 100%   Weight:  85.7 kg (189 lb)  Height:  5\' 11"  (1.803 m)   General:  A&O x 3, NAD, nontoxic,  pleasant/cooperative Head/Eye: No conjunctival hemorrhage, no icterus, [place]/AT, No nystagmus ENT:  No icterus,  No thrush, good dentition, no pharyngeal exudate Neck:  No masses, no lymphadenpathy, no bruits CV:  RRR, no rub, no gallop, no S3 Lung:  CTAB, good air movement, no wheeze, no rhonchi Abdomen: soft/NT, +BS, nondistended, no peritoneal signs Ext: No cyanosis, No rashes, No petechiae, No lymphangitis, No edema Neuro: CNII-XII intact, strength 4/5 in bilateral upper and lower extremities, no dysmetria  Labs on Admission:  Basic Metabolic Panel:  Recent Labs Lab 07/01/17 1012 07/05/17 1311  NA 137 137  K 3.2* 3.2*  CL 109 112*  CO2 16* 16*  GLUCOSE 122* 127*  BUN 82* 72*  CREATININE 3.25* 3.13*  CALCIUM 8.7* 8.6*   Liver Function Tests:  Recent Labs Lab 07/01/17 1012 07/05/17 1311  AST 13* 15  ALT 13* 13*  ALKPHOS 73 76  BILITOT 0.6 0.2*  PROT 7.8 7.5  ALBUMIN 3.0* 3.1*    Recent Labs Lab 07/05/17 1311  LIPASE 54*   No results for input(s): AMMONIA in the last 168 hours. CBC:  Recent Labs Lab 07/01/17 1012 07/05/17 1311  WBC 5.8 6.6  NEUTROABS 3.5  --   HGB 9.8* 9.5*  HCT 30.6* 29.8*  MCV 92.7 93.7  PLT 224 249   Coagulation Profile: No results for input(s): INR, PROTIME in the last 168 hours. Cardiac Enzymes: No results for input(s): CKTOTAL, CKMB, CKMBINDEX, TROPONINI in the last 168 hours. BNP: Invalid input(s): POCBNP CBG: No results for input(s): GLUCAP in the last 168 hours. Urine analysis:    Component Value Date/Time   COLORURINE STRAW (A) 09/05/2016 0530   APPEARANCEUR CLEAR 09/05/2016 0530   LABSPEC 1.017 09/05/2016 0530   PHURINE 5.0 09/05/2016 0530   GLUCOSEU NEGATIVE 09/05/2016 0530   HGBUR NEGATIVE 09/05/2016 0530   BILIRUBINUR NEGATIVE 09/05/2016 0530   KETONESUR NEGATIVE 09/05/2016 0530   PROTEINUR NEGATIVE 09/05/2016 0530   UROBILINOGEN 0.2 07/05/2008 0922   NITRITE NEGATIVE 09/05/2016 0530   LEUKOCYTESUR NEGATIVE  09/05/2016 0530   Sepsis Labs: @LABRCNTIP (procalcitonin:4,lacticidven:4) )No results found for this or any previous visit (from the past 240 hour(s)).   Radiological Exams on Admission: No results found.  EKG: Independently reviewed. Sinus rhythm, no ST-T wave change    Time spent:60 minutes Code Status:   FULL Family Communication:  Fiance updated at bedside Disposition Plan: expect 2-3 day  hospitalization Consults called: GI DVT Prophylaxis: apixaban  Khalilah Hoke, DO  [facility] Pager [phone]  If 7PM-7AM, please contact night-coverage [email or web address] Password [email or web address] 07/05/2017, 4:04 PM

## 2017-07-05 NOTE — ED Triage Notes (Signed)
Patient sent by Dr. [name] for hyportension, elevated BUN/Creatine. States patient has completed 2 rounds of vancomycin for c-diff. States patient had sample report of negative today but patient still having approx. 10 watery stools a day. States he would like to patient to be admitted and be seen by GI. Writer spoke with Dr. [name] via telephone.

## 2017-07-05 NOTE — ED Provider Notes (Signed)
[facility] EMERGENCY DEPARTMENT Provider Note   CSN: [id number] Arrival date & time: 07/05/17  1242     History   Chief Complaint Chief Complaint  Patient presents with  . Diarrhea  . Hypotension    HPI Stephen [name] is a 76 y.o. male.   Diarrhea   This is a chronic problem. The current episode started more than 1 week ago. The problem occurs 5 to 10 times per day. The problem has not changed since onset.The stool consistency is described as watery. There has been no fever. Pertinent negatives include no vomiting, no chills and no headaches.    Past Medical History:  Diagnosis Date  . Anemia 06/01/2012   H&H of 10/30 in 8/13 with normal MCV   . Anemia of chronic renal failure, stage 3 (moderate) (HCC) 06/01/2012   H&H of 10/30 in 8/13 with normal MCV  . Atrial fibrillation ([place])    Documented 08/2016  . B12 deficiency 08/17/2016  . Carotid artery occlusion    Right carotid endarterectomy 2001; left carotid endarterectomy in 2008  . Celiac disease   . Chronic renal disease, stage 3, moderately decreased glomerular filtration rate between 30-59 mL/min/1.73 square meter ([place]) 06/01/2012  . Coronary atherosclerosis of native coronary artery    PTCA of LAD in 1994; total obstruction of the RCA treated medically; subsequent stress nuclear study with inferior ischemia  . DDD (degenerative disc disease), lumbar    Lumbar surgery in 1984  . Diabetes mellitus type II   . Essential hypertension, benign   . History of stroke   . Hyperlipidemia   . Iron deficiency anemia 12/31/2016  . Peripheral vascular disease ([place])   . Stroke [facility])    Right brain 2001; right PCA, MCA/PCA, left SCA infarcts 08/2017 with atrial fibrillation    Patient Active Problem List   Diagnosis Date Noted  . Acute renal failure superimposed on stage 4 chronic kidney disease ([place]) 07/05/2017  . Permanent atrial fibrillation ([place]) 07/05/2017  . Diarrhea 07/05/2017  . Iron deficiency anemia 12/31/2016    . Paroxysmal atrial fibrillation ([place]) 12/14/2016  . Stroke (cerebrum) ([place]) 09/04/2016  . B12 deficiency 08/17/2016  . PAD (peripheral artery disease) ([place]) 01/21/2016  . Irregular heartbeat 07/19/2014  . Atherosclerotic PVD with intermittent claudication ([place]) 07/17/2014  . Carotid stenosis 07/12/2014  . Chronic diastolic heart failure ([place]) 09/27/2013  . Occlusion and stenosis of carotid artery without mention of cerebral infarction 06/28/2012  . Anemia of chronic renal failure, stage 3 (moderate) ([place]) 06/01/2012  . Chronic renal disease, stage 3, moderately decreased glomerular filtration rate between 30-59 mL/min/1.73 square meter (HCC) 06/01/2012  . Diabetes mellitus type II   . Essential hypertension, benign   . Hyperlipidemia   . Cerebrovascular disease   . Coronary atherosclerosis of native coronary artery   . Peripheral vascular disease [facility])     Past Surgical History:  Procedure Laterality Date  . APPENDECTOMY  1970  . CAROTID ENDARTERECTOMY  2008   Left   . CAROTID ENDARTERECTOMY  06/15/2000   Right  . COLONOSCOPY N/A 10/10/2015   Procedure: COLONOSCOPY;  Surgeon: Rogene Houston, MD;  Location: [facility] ENDO SUITE;  Service: Endoscopy;  Laterality: N/A;  12:25 - moved to 11:15 - Ann to notify  . [place] SURGERY  2000, 2001   L5 HNP required 2 surgical procedures       Home Medications    Prior to Admission medications   Medication Sig Start Date End Date Taking? Authorizing  Provider  acetaminophen (TYLENOL) 500 MG tablet Take 500 mg by mouth every 6 (six) hours as needed.   Yes [provider]  allopurinol (ZYLOPRIM) 100 MG tablet Take 200 mg by mouth daily.  12/16/16  Yes [provider]  apixaban (ELIQUIS) 5 MG TABS tablet Take 1 tablet (5 mg total) by mouth 2 (two) times daily. 04/26/17  Yes Rosalin Hawking, MD  aspirin 81 MG tablet Take 81 mg by mouth daily.   Yes [provider]  atenolol (TENORMIN) 50 MG tablet Take 50 mg by mouth  daily.     Yes [provider]  Cholecalciferol (VITAMIN D-3) 1000 units CAPS Take 1 capsule by mouth daily.   Yes [provider]  doxazosin (CARDURA) 2 MG tablet Take 2 mg by mouth daily.   Yes [provider]  furosemide (LASIX) 40 MG tablet Take 1 tablet (40 mg total) by mouth daily. TAKE 1 AND 1/2 TABLET (60 MG) IN THE AM AND TAKE 1 TABLET (40 MG) IN THE EVENING 09/08/16  Yes Rosalin Hawking, MD  insulin aspart (NOVOLOG) 100 UNIT/ML injection Inject 15 Units into the skin 2 (two) times daily.    Yes [provider]  insulin detemir (LEVEMIR) 100 UNIT/ML injection Inject 50 Units into the skin at bedtime.    Yes [provider]  Lansoprazole (PREVACID PO) Take 1 capsule by mouth daily.    Yes [provider]  pravastatin (PRAVACHOL) 40 MG tablet Take 40 mg by mouth daily.   Yes [provider]  sodium bicarbonate 650 MG tablet Take 650 mg by mouth 3 (three) times daily.    Yes [provider]  vancomycin (VANCOCIN) 125 MG capsule Take 125 mg by mouth every other day.   Yes [provider]  vitamin B-12 (CYANOCOBALAMIN) 1000 MCG tablet Take 2 tabs by mouth daily. 11/05/16  Yes Twana First, MD    Family History Family History  Problem Relation Age of Onset  . Heart disease Mother        Before age 45  . Diabetes Mother   . Heart attack Mother   . Diabetes Sister   . Heart disease Brother   . Diabetes Brother   . Diabetes Daughter   . Diabetes Son   . Hyperlipidemia Son     Social History Social History  Substance Use Topics  . Smoking status: Former Smoker    Packs/day: 3.00    Years: 10.00    Types: Cigarettes    Quit date: 09/21/1972  . Smokeless tobacco: Never Used  . Alcohol use No     Allergies   Patient has no known allergies.   Review of Systems Review of Systems  Constitutional: Negative for chills.  Gastrointestinal: Positive for diarrhea. Negative for vomiting.  Neurological:  Negative for headaches.  All other systems reviewed and are negative.    Physical Exam Updated Vital Signs BP (!) 116/57 (BP Location: Right Arm)   Pulse (!) 56   Temp 97.6 F (36.4 C) (Oral)   Resp 18   Ht 5\' 11"  (1.803 m)   Wt 85.7 kg (189 lb)   SpO2 100%   BMI 26.36 kg/m   Physical Exam  Constitutional: He is oriented to person, place, and time. He appears well-developed and well-nourished.  HENT:  Head: Normocephalic and atraumatic.  Mouth/Throat: Mucous membranes are dry.  Eyes: Conjunctivae and EOM are normal.  Neck: Normal range of motion.  Cardiovascular: Normal rate.   Pulmonary/Chest: Effort normal.  No respiratory distress.  Abdominal: Soft. He exhibits no distension.  Musculoskeletal: Normal range of motion.  Neurological: He is alert and oriented to person, place, and time. No cranial nerve deficit. Coordination normal.  Skin: Skin is warm and dry.  Nursing note and vitals reviewed.    ED Treatments / Results  Labs (all labs ordered are listed, but only abnormal results are displayed) Labs Reviewed  LIPASE, BLOOD - Abnormal; Notable for the following:       Result Value   Lipase 54 (*)    All other components within normal limits  COMPREHENSIVE METABOLIC PANEL - Abnormal; Notable for the following:    Potassium 3.2 (*)    Chloride 112 (*)    CO2 16 (*)    Glucose, Bld 127 (*)    BUN 72 (*)    Creatinine, Ser 3.13 (*)    Calcium 8.6 (*)    Albumin 3.1 (*)    ALT 13 (*)    Total Bilirubin 0.2 (*)    GFR calc non Af Amer 18 (*)    GFR calc Af Amer 21 (*)    All other components within normal limits  CBC - Abnormal; Notable for the following:    RBC 3.18 (*)    Hemoglobin 9.5 (*)    HCT 29.8 (*)    RDW 16.3 (*)    All other components within normal limits  URINALYSIS, ROUTINE W REFLEX MICROSCOPIC - Abnormal; Notable for the following:    Hgb urine dipstick MODERATE (*)    Squamous Epithelial / LPF 0-5 (*)    All other components within normal  limits  GASTROINTESTINAL PANEL BY PCR, STOOL (REPLACES STOOL CULTURE)  C DIFFICILE QUICK SCREEN W PCR REFLEX    EKG  EKG Interpretation None       Radiology No results found.  Procedures Procedures (including critical care time)  Medications Ordered in ED Medications  sodium chloride 0.9 % bolus 1,000 mL (1,000 mLs Intravenous New Bag/Given 07/05/17 1513)     Initial Impression / Assessment and Plan / ED Course  I have reviewed the triage vital signs and the nursing notes.  Pertinent labs & imaging results that were available during my care of the patient were reviewed by me and considered in my medical decision making (see chart for details).     Here secondary to primary care direction for admission to see Dr. Melony Overly with gastrology. Sounds like he's had difficult to treat C. Difficile colitis and is on his second round of vancomycin at this point. Patient is slightly dry on exam and labs consistent with worsening renal injury. I discussed with Dr. Laural Golden who agrees with admission and they will see him in the hospital. Schedule hospitalist who will admit for further management.  Final Clinical Impressions(s) / ED Diagnoses   Final diagnoses:  Diarrhea, unspecified type  AKI (acute kidney injury) [facility])    New Prescriptions New Prescriptions   No medications on file     Bain Whichard, Corene Cornea, MD 07/05/17 1731

## 2017-07-05 NOTE — Progress Notes (Signed)
Order for C-diff/GI panel and Fecal lactoferrin, C-diff and GI panel was sent in ED and Bobby in lab stated that it does not need to be recollected. RN asked Tammi Klippel in lab to add on the Fecal Lactoferrin to previous lab  And she stated the stool was used, so we will need a new sample for that.  Patient made aware.

## 2017-07-06 DIAGNOSIS — R634 Abnormal weight loss: Secondary | ICD-10-CM

## 2017-07-06 DIAGNOSIS — K909 Intestinal malabsorption, unspecified: Secondary | ICD-10-CM

## 2017-07-06 DIAGNOSIS — A040 Enteropathogenic Escherichia coli infection: Secondary | ICD-10-CM | POA: Diagnosis present

## 2017-07-06 DIAGNOSIS — R197 Diarrhea, unspecified: Secondary | ICD-10-CM

## 2017-07-06 DIAGNOSIS — E43 Unspecified severe protein-calorie malnutrition: Secondary | ICD-10-CM | POA: Insufficient documentation

## 2017-07-06 LAB — BASIC METABOLIC PANEL
Anion gap: 10 (ref 5–15)
BUN: 63 mg/dL — ABNORMAL HIGH (ref 6–20)
CHLORIDE: 116 mmol/L — AB (ref 101–111)
CO2: 13 mmol/L — ABNORMAL LOW (ref 22–32)
Calcium: 8.4 mg/dL — ABNORMAL LOW (ref 8.9–10.3)
Creatinine, Ser: 2.72 mg/dL — ABNORMAL HIGH (ref 0.61–1.24)
GFR calc Af Amer: 25 mL/min — ABNORMAL LOW (ref >60)
GFR, EST NON AFRICAN AMERICAN: 21 mL/min — AB (ref >60)
Glucose, Bld: 114 mg/dL — ABNORMAL HIGH (ref 65–99)
POTASSIUM: 3.6 mmol/L (ref 3.5–5.1)
SODIUM: 139 mmol/L (ref 135–145)

## 2017-07-06 LAB — CBC
HEMATOCRIT: 28.6 % — AB (ref 39.0–52.0)
HEMOGLOBIN: 9 g/dL — AB (ref 13.0–17.0)
MCH: 29.4 pg (ref 26.0–34.0)
MCHC: 31.5 g/dL (ref 30.0–36.0)
MCV: 93.5 fL (ref 78.0–100.0)
Platelets: 223 10*3/uL (ref 150–400)
RBC: 3.06 MIL/uL — AB (ref 4.22–5.81)
RDW: 16.6 % — ABNORMAL HIGH (ref 11.5–15.5)
WBC: 6 10*3/uL (ref 4.0–10.5)

## 2017-07-06 LAB — GASTROINTESTINAL PANEL BY PCR, STOOL (REPLACES STOOL CULTURE)
ADENOVIRUS F40/41: NOT DETECTED
ASTROVIRUS: NOT DETECTED
CAMPYLOBACTER SPECIES: NOT DETECTED
CRYPTOSPORIDIUM: NOT DETECTED
Cyclospora cayetanensis: NOT DETECTED
ENTEROPATHOGENIC E COLI (EPEC): DETECTED — AB
ENTEROTOXIGENIC E COLI (ETEC): NOT DETECTED
Entamoeba histolytica: NOT DETECTED
Enteroaggregative E coli (EAEC): NOT DETECTED
Giardia lamblia: NOT DETECTED
Norovirus GI/GII: NOT DETECTED
PLESIMONAS SHIGELLOIDES: NOT DETECTED
ROTAVIRUS A: NOT DETECTED
SAPOVIRUS (I, II, IV, AND V): NOT DETECTED
SHIGA LIKE TOXIN PRODUCING E COLI (STEC): NOT DETECTED
Salmonella species: NOT DETECTED
Shigella/Enteroinvasive E coli (EIEC): NOT DETECTED
Vibrio cholerae: NOT DETECTED
Vibrio species: NOT DETECTED
YERSINIA ENTEROCOLITICA: NOT DETECTED

## 2017-07-06 LAB — MAGNESIUM: Magnesium: 1.6 mg/dL — ABNORMAL LOW (ref 1.7–2.4)

## 2017-07-06 LAB — GLUCOSE, CAPILLARY
GLUCOSE-CAPILLARY: 114 mg/dL — AB (ref 65–99)
GLUCOSE-CAPILLARY: 108 mg/dL — AB (ref 65–99)
Glucose-Capillary: 142 mg/dL — ABNORMAL HIGH (ref 65–99)
Glucose-Capillary: 131 mg/dL — ABNORMAL HIGH (ref 65–99)

## 2017-07-06 LAB — HEMOGLOBIN A1C
HEMOGLOBIN A1C: 5.6 % (ref 4.8–5.6)
MEAN PLASMA GLUCOSE: 114.02 mg/dL

## 2017-07-06 LAB — LACTOFERRIN, FECAL, QUALITATIVE: Lactoferrin, Fecal, Qual: NEGATIVE

## 2017-07-06 MED ORDER — LOPERAMIDE HCL 2 MG PO CAPS
2.0000 mg | ORAL_CAPSULE | Freq: Three times a day (TID) | ORAL | Status: DC
  Administered 2017-07-06 – 2017-07-07 (×4): 2 mg via ORAL
  Filled 2017-07-06 (×4): qty 1

## 2017-07-06 NOTE — Progress Notes (Addendum)
07/06/2017 5:14 PM  GI pathogen panel positive for Enteropathogenic E coli (EPEC), stool culture still in progress.  Continue contact precautions.  I updated patient and wife.    Murvin Natal MD

## 2017-07-06 NOTE — Progress Notes (Signed)
CRITICAL VALUE ALERT  Critical Value:  GI PCR positive for enteropathogenic ecoli  Date & Time Notied:  07/06/17 1710  Provider Notified: Dr. Wynetta Emery notified via text page  Dr. Laural Golden called and notified.   Orders Received/Actions taken: No new orders at this time.

## 2017-07-06 NOTE — Progress Notes (Signed)
PROGRESS NOTE   Stephen [name]  YKD:[id number]  DOB: 1941-04-08  DOA: 07/05/2017 PCP: [name], MD  Brief Admission Hx: Stephen [name] is a 76 y.o. male with medical history of diabetes mellitus, hypertension, persistent atrial fibrillation on apixiban, and CKD stage IV presenting with diarrhea for the better part of the past month.  MDM/Assessment & Plan:   1. Chronic diarrhea - Pt has endured months of diarrhea, recently treated x 2 for c diff with oral vancomycin with no improvement.  Follow repeat c diff testing, follow stool pathogen panel, stool lactoferrin studies, GI consult recommended.  I spoke with Dr. Laural Golden and he favors a malabsorptive diagnosis, he is doing a further work up for this.  See his notes.  Lomotil ordered by GI today.   2. Abnormal weight loss - a diagnosis of malabsorption is being investigated as well as celiac disease.   3. Permanent atrial fibrillation - rate is well controlled, anticoagulated with apixaban.  4. Essential hypertension - holding atenolol and cardura due to soft blood pressures.  5. Type 2 Diabetes mellitus - checking A1c, continue basal insulin and sliding scale coverage.  6. CAD - stable, continue his daily aspirin.   7. Hyperlipidemia - continue pravastatin.  8. Acute on chronic CKD stage 4 - improving with IVF hydration, holding furosemide, resume home bicarbonate.   DVT prophylaxis: eliquis Code Status: full  Family Communication: fiance  Disposition Plan: Home when medically stabilized.    Consultants:  Dr. Laural Golden (GI)  Procedures:  N/a    Subjective: Pt says that he feels a lot better and even the diarrhea has slowed down.  He was able to rest last night not having to get up to have a BM.   Objective: Vitals:   07/05/17 1815 07/05/17 1900 07/05/17 1957 07/06/17 0504  BP:  119/69 116/85 (!) 119/56  Pulse: 79   81  Resp: 18   18  Temp:   97.8 F (36.6 C) 98.3 F (36.8 C)  TempSrc:   Oral Oral  SpO2: 100%  100%  99%  Weight:   85.9 kg (189 lb 6.4 oz)   Height:   6' (1.829 m)     Intake/Output Summary (Last 24 hours) at 07/06/17 0825 Last data filed at 07/06/17 0504  Gross per 24 hour  Intake             1655 ml  Output              400 ml  Net             1255 ml   Filed Weights   07/05/17 1252 07/05/17 1957  Weight: 85.7 kg (189 lb) 85.9 kg (189 lb 6.4 oz)   REVIEW OF SYSTEMS  As per history otherwise all reviewed and reported negative  Exam:  General exam: awake, alert, NAD. Cooperative.   Respiratory system:  No increased work of breathing. Cardiovascular system: S1 & S2 heard No JVD. Gastrointestinal system: Abdomen is nondistended, soft and nontender. Normal bowel sounds heard. Central nervous system: Alert and oriented. No focal neurological deficits. Extremities: no cyanosis or clubbing.   Data Reviewed: Basic Metabolic Panel:  Recent Labs Lab 07/01/17 1012 07/05/17 1311 07/06/17 0700  NA 137 137 139  K 3.2* 3.2* 3.6  CL 109 112* 116*  CO2 16* 16* 13*  GLUCOSE 122* 127* 114*  BUN 82* 72* 63*  CREATININE 3.25* 3.13* 2.72*  CALCIUM 8.7* 8.6* 8.4*  MG  --   --  1.6*   Liver Function Tests:  Recent Labs Lab 07/01/17 1012 07/05/17 1311  AST 13* 15  ALT 13* 13*  ALKPHOS 73 76  BILITOT 0.6 0.2*  PROT 7.8 7.5  ALBUMIN 3.0* 3.1*    Recent Labs Lab 07/05/17 1311  LIPASE 54*   No results for input(s): AMMONIA in the last 168 hours. CBC:  Recent Labs Lab 07/01/17 1012 07/05/17 1311 07/06/17 0700  WBC 5.8 6.6 6.0  NEUTROABS 3.5  --   --   HGB 9.8* 9.5* 9.0*  HCT 30.6* 29.8* 28.6*  MCV 92.7 93.7 93.5  PLT 224 249 223   Cardiac Enzymes:  Recent Labs Lab 07/05/17 1954  CKTOTAL 43*   CBG (last 3)   Recent Labs  07/05/17 2123 07/06/17 0741  GLUCAP 128* 114*   Recent Results (from the past 240 hour(s))  C difficile quick scan w PCR reflex     Status: None   Collection Time: 07/05/17  3:03 PM  Result Value Ref Range Status   C Diff  antigen NEGATIVE NEGATIVE Final   C Diff toxin NEGATIVE NEGATIVE Final   C Diff interpretation No C. difficile detected.  Final   Studies: No results found.   Scheduled Meds: . allopurinol  200 mg Oral Daily  . apixaban  5 mg Oral BID  . aspirin  81 mg Oral Daily  . cholecalciferol  1,000 Units Oral Daily  . feeding supplement (ENSURE ENLIVE)  237 mL Oral BID BM  . insulin aspart  0-15 Units Subcutaneous TID WC  . insulin aspart  0-5 Units Subcutaneous QHS  . insulin detemir  25 Units Subcutaneous QHS  . pantoprazole  40 mg Oral Daily  . pravastatin  40 mg Oral q1800  . sodium bicarbonate  650 mg Oral TID  . vitamin B-12  2,000 mcg Oral Daily   Continuous Infusions: . 0.9 % NaCl with KCl 40 mEq / L 75 mL/hr (07/05/17 2232)    Active Problems:   Essential hypertension, benign   Chronic diastolic heart failure (HCC)   PAD (peripheral artery disease) (HCC)   Acute renal failure superimposed on stage 4 chronic kidney disease ([place])   Permanent atrial fibrillation ([place])   Diarrhea  Time spent:   Irwin Brakeman, MD, FAAFP [facility] Pager [phone]  If 7PM-7AM, please contact night-coverage [email or web address] Password [email or web address] 07/06/2017, 8:25 AM    LOS: 1 day

## 2017-07-06 NOTE — Consult Note (Signed)
Referring Provider: Irwin Brakeman, MD Primary Care Physician:  [name], MD Primary Gastroenterologist:  Dr. Laural Golden  Reason for Consultation:    Diarrhea  HPI:   Patient is 76 year old Caucasian male who began to have diarrhea back in May this year after took 1 week. Symptomatic therapy did not help. He did not experience abdominal pain nausea vomiting or fever. He finally saw Dr. [name] last month.stool test was positive for c.difficile by PCR. Patient was treated with vancomycin for 2 weeks but without symptomatic improvement. He was begun on vancomycin last week. Once again he did not feel any better. He has continued to lose weight. He felt very weak. Dr. [name] felt that he was dehydrated. Patient was sent over to emergency room where he was evaluated and hospitalized.he was begun on IV fluids. Stool testing for C. Difficile antigen and toxin is negative. GI pathogen panel is pending. Since receiving IV fluids he feels better. He remains with diarrhea. He has had 3-4 watery stools today. His fiance is at bedside and states he has had good appetite. He states he has lost 40 pounds since diarrhea started 5 months ago. He was having a bowel movement daily or every other day prior to that. He denies melena or rectal bleeding nausea vomiting fever or chills. He had screening colonoscopy by me in January 2017 revealing mild sigmoid colon diverticulosis. He has tried liquid loperamide on as-needed basis but has not provided any relief.   He is retired. He owned and Research scientist (life sciences). He smokes cigarettes for about 10 years and quit  40 years ago. He was smoking three  packs a day when he quit cigarette smoking. He does not drink alcohol. He has 2 children. His son is on insulin but daughter is not.  Father died of brain cancer 4. Moth of MI at age 32. Other brother died of lung carcinoma at age 61. Another brother died in his sleep. He was around 75. He has 2 sisters living. One  is diabetic.   Past Medical History:  Diagnosis Date  . Anemia 06/01/2012   H&H of 10/30 in 8/13 with normal MCV   . Anemia of chronic renal failure, stage 3 (moderate) (HCC) 06/01/2012   H&H of 10/30 in 8/13 with normal MCV  . Atrial fibrillation ([place])    Documented 08/2016  . B12 deficiency 08/17/2016  . Carotid artery occlusion    Right carotid endarterectomy 2001; left carotid endarterectomy in 2008  . Celiac disease. Patient is not sure when this diagnosis was made.   . Chronic renal disease, stage 3, moderately decreased glomerular filtration rate between 30-59 mL/min/1.73 square meter ([place]) 06/01/2012  . Coronary atherosclerosis of native coronary artery    PTCA of LAD in 1994; total obstruction of the RCA treated medically; subsequent stress nuclear study with inferior ischemia  . DDD (degenerative disc disease), lumbar    Lumbar surgery in 1984  . Diabetes mellitus type II   . Essential hypertension, benign   . History of stroke   . Hyperlipidemia   . Iron deficiency anemia 12/31/2016  . Peripheral vascular disease ([place])   . Stroke [facility])    Right brain 2001; right PCA, MCA/PCA, left SCA infarcts 08/2017 with atrial fibrillation    Past Surgical History:  Procedure Laterality Date  . APPENDECTOMY  1970  . CAROTID ENDARTERECTOMY  2008   Left   . CAROTID ENDARTERECTOMY  06/15/2000   Right  . COLONOSCOPY N/A 10/10/2015   Procedure: COLONOSCOPY;  Surgeon: Rogene Houston, MD;  Location: [facility];  Service: Endoscopy;  Laterality: N/A;  12:25 - moved to 11:15 - Ann to notify  . [place] SURGERY  2000, 2001   L5 HNP required 2 surgical procedures    Prior to Admission medications   Medication Sig Start Date End Date Taking? Authorizing Provider  acetaminophen (TYLENOL) 500 MG tablet Take 500 mg by mouth every 6 (six) hours as needed.   Yes [provider]  allopurinol (ZYLOPRIM) 100 MG tablet Take 200 mg by mouth daily.  12/16/16  Yes [provider]  apixaban (ELIQUIS) 5 MG TABS tablet Take 1 tablet (5 mg total) by mouth 2 (two) times daily. 04/26/17  Yes Rosalin Hawking, MD  aspirin 81 MG tablet Take 81 mg by mouth daily.   Yes [provider]  atenolol (TENORMIN) 50 MG tablet Take 50 mg by mouth daily.     Yes [provider]  Cholecalciferol (VITAMIN D-3) 1000 units CAPS Take 1 capsule by mouth daily.   Yes [provider]  doxazosin (CARDURA) 2 MG tablet Take 2 mg by mouth daily.   Yes [provider]  furosemide (LASIX) 40 MG tablet Take 1 tablet (40 mg total) by mouth daily. TAKE 1 AND 1/2 TABLET (60 MG) IN THE AM AND TAKE 1 TABLET (40 MG) IN THE EVENING 09/08/16  Yes Rosalin Hawking, MD  insulin aspart (NOVOLOG) 100 UNIT/ML injection Inject 15 Units into the skin 2 (two) times daily.    Yes [provider]  insulin detemir (LEVEMIR) 100 UNIT/ML injection Inject 50 Units into the skin at bedtime.    Yes [provider]  Lansoprazole (PREVACID PO) Take 1 capsule by mouth daily.    Yes [provider]  pravastatin (PRAVACHOL) 40 MG tablet Take 40 mg by mouth daily.   Yes [provider]  sodium bicarbonate 650 MG tablet Take 650 mg by mouth 3 (three) times daily.    Yes [provider]  vancomycin (VANCOCIN) 125 MG capsule Take 125 mg by mouth every other day.   Yes [provider]  vitamin B-12 (CYANOCOBALAMIN) 1000 MCG tablet Take 2 tabs by mouth daily. 11/05/16  Yes Twana First, MD    Current Facility-Administered Medications  Medication Dose Route Frequency Provider Last Rate Last Dose  . 0.9 % NaCl with KCl 40 mEq / L  infusion   Intravenous Continuous Tat, David, MD 75 mL/hr at 07/06/17 1236 75 mL/hr at 07/06/17 1236  . acetaminophen (TYLENOL) tablet 650 mg  650 mg Oral Q6H PRN Tat, David, MD       Or  . acetaminophen (TYLENOL) suppository 650 mg  650 mg Rectal Q6H PRN Tat, Shanon Brow, MD      . allopurinol (ZYLOPRIM) tablet 200 mg  200 mg Oral Daily  Tat, David, MD   200 mg at 07/06/17 1058  . apixaban (ELIQUIS) tablet 5 mg  5 mg Oral BID Tat, David, MD   5 mg at 07/06/17 1058  . aspirin chewable tablet 81 mg  81 mg Oral Daily Tat, David, MD   81 mg at 07/06/17 1058  . cholecalciferol (VITAMIN D) tablet 1,000 Units  1,000 Units Oral Daily Tat, David, MD   1,000 Units at 07/06/17 1058  . feeding supplement (ENSURE ENLIVE) (ENSURE ENLIVE) liquid 237 mL  237 mL Oral BID BM Tat, Shanon Brow, MD   237 mL at 07/06/17 1056  . insulin aspart (novoLOG) injection 0-15 Units  0-15  Units Subcutaneous TID WC Orson Eva, MD   2 Units at 07/06/17 1236  . insulin aspart (novoLOG) injection 0-5 Units  0-5 Units Subcutaneous QHS Tat, David, MD      . insulin detemir (LEVEMIR) injection 25 Units  25 Units Subcutaneous Benay Pike, MD   25 Units at 07/05/17 2236  . loperamide (IMODIUM) capsule 2 mg  2 mg Oral TID AC Whitlee Sluder U, MD      . ondansetron (ZOFRAN) tablet 4 mg  4 mg Oral Q6H PRN Tat, David, MD       Or  . ondansetron (ZOFRAN) injection 4 mg  4 mg Intravenous Q6H PRN Tat, David, MD      . pantoprazole (PROTONIX) EC tablet 40 mg  40 mg Oral Daily Tat, David, MD   40 mg at 07/06/17 1058  . pravastatin (PRAVACHOL) tablet 40 mg  40 mg Oral q1800 Tat, David, MD      . sodium bicarbonate tablet 650 mg  650 mg Oral TID Orson Eva, MD   650 mg at 07/06/17 1058  . vitamin B-12 (CYANOCOBALAMIN) tablet 2,000 mcg  2,000 mcg Oral Daily Tat, David, MD   2,000 mcg at 07/06/17 1058   Facility-Administered Medications Ordered in Other Encounters  Medication Dose Route Frequency Provider Last Rate Last Dose  . 0.9 %  sodium chloride infusion   Intravenous Continuous Baird Cancer, PA-C 10 mL/hr at 08/31/16 6387    . acetaminophen (TYLENOL) tablet 650 mg  650 mg Oral Once [name], MD        Allergies as of 07/05/2017  . (No Known Allergies)    Family History  Problem Relation Age of Onset  . Heart disease Mother        Before age 90  .  Diabetes Mother   . Heart attack Mother   . Diabetes Sister   . Heart disease Brother   . Diabetes Brother   . Diabetes Daughter   . Diabetes Son   . Hyperlipidemia Son     Social History   Social History  . Marital status: Widowed    Spouse name: N/A  . Number of children: N/A  . Years of education: N/A   Occupational History  . Not on file.   Social History Main Topics  . Smoking status: Former Smoker    Packs/day: 3.00    Years: 10.00    Types: Cigarettes    Quit date: 09/21/1972  . Smokeless tobacco: Never Used  . Alcohol use No  . Drug use: No  . Sexual activity: Yes    Partners: Male    Birth control/ protection: Post-menopausal   Other Topics Concern  . Not on file   Social History Narrative  . No narrative on file    Review of Systems: See HPI, otherwise normal ROS  Physical Exam: Temp:  [97.8 F (36.6 C)-98.3 F (36.8 C)] 98.3 F (36.8 C) (10/16 0504) Pulse Rate:  [52-122] 81 (10/16 0504) Resp:  [15-20] 18 (10/16 0504) BP: (87-140)/(50-85) 119/56 (10/16 0504) SpO2:  [99 %-100 %] 99 % (10/16 0504) Weight:  [189 lb 6.4 oz (85.9 kg)] 189 lb 6.4 oz (85.9 kg) (10/15 1957) Last BM Date: 07/06/17  Patient is alert and in no acute distress. Conjunctiva is pale. Sclera is non-icteric. Oropharyngeal mucosa is normal. Few teeth are missing and remaining in satisfactory condition. No neck masses or thyromegaly noted. Cardiac exam with regular rhythm normal S1 and S2. No murmur or  gallop noted. lungs are clear to auscultation. Abdomen is symmetrical. Atrophic changes noted to  Skin across upper abdomen from insulin injections.bowel sounds are nor masses. No peripheral edema or clubbing noted.   Lab Results:  Recent Labs  07/05/17 1311 07/06/17 0700  WBC 6.6 6.0  HGB 9.5* 9.0*  HCT 29.8* 28.6*  PLT 249 223   BMET  Recent Labs  07/05/17 1311 07/06/17 0700  NA 137 139  K 3.2* 3.6  CL 112* 116*  CO2 16* 13*  GLUCOSE 127* 114*  BUN 72*  63*  CREATININE 3.13* 2.72*  CALCIUM 8.6* 8.4*   LFT  Recent Labs  07/05/17 1311  PROT 7.5  ALBUMIN 3.1*  AST 15  ALT 13*  ALKPHOS 76  BILITOT 0.2*   C. Difficile antigen negative C. Difficile toxin negative  Assessment;  Patient is 76 old Caucasian male was been diabetic for about 24 years who presents with over 5 month history of diarrhea associated with 40 pound weight loss. Along the way he was diagnosed and treated for C. Difficile colitis but never responded to by mouth vancomycin. He has had 2 negative tests for C. Difficile. One was performed by Dr. [name] and the other one was done last evening. He has lost all all dysphagia despite having normal appetite and he has be well according to his fiance. He does not appear to be acutely ill. I suspect we are dealing with malabsorptive syndrome. It remains to be seen if he has celiac disease or pancreatic insufficiency. He has history of B12 deficiency and is on by medication.  Anemia. Anemia appears to be due to chronic disease. No evidence of GI bleed.  Chronic kidney disease. Renal function has improved with hydration.    Recommendations;  Qualitative fecal fat analysis. Celiac antibody panel. B12 and folate levels. Loperamide 2 mg by mouth 3 day. Further recommendations to follow.   LOS: 1 day   Lindamarie Maclachlan  07/06/2017, 1:40 PM

## 2017-07-06 NOTE — Progress Notes (Signed)
Initial Nutrition Assessment  DOCUMENTATION CODES:  Severe malnutrition in context of acute illness/injury  INTERVENTION:  Continue Ensure Enlive po BID, each supplement provides 350 kcal and 20 grams of protein  Given significant weight loss and suspicion for comprised absorptive capacity, would recommend MVI of MD discretion given CKD4.   Food requests noted, will add to trays  NUTRITION DIAGNOSIS:  Malnutrition (Severe, acute context) related to altered GI function (Diarrhea/malabsorption) as evidenced by moderate depletions of muscle/fat and loss of >10% bw in 4 months.   GOAL:  Patient will meet greater than or equal to 90% of their needs  MONITOR:  PO intake, Supplement acceptance, Labs, I & O's  REASON FOR ASSESSMENT:  Malnutrition Screening Tool    ASSESSMENT:  76 y/o male PMHx DM2, HTN, AFIB, CKD4, CVA, Celiac disease?, PVD, HLD, CAD. Had been diagnosed with C diff 3 weeks PTA. Diarrhea has persisted despite 2x 10-day courses of oral abx. At PCP visit, noted to have low BP and sent to ED for evaluation.   Pt reports that his episodes of diarrhea are brought upon by eating. He says the time from which he eats to when he has a bowel movement varies, but he feels they are certainly related. He will sometimes feel an immediate urge to use the bathroom WHILE eating and other times, such as today, it will take 45 minutes after eating.  Other than the diarrhea, he does not note many issues. He says his appetite is good, in fact "it might be better" recently. He denies any nausea or vomiting associated with the diarrhea. The only other issue he has had is lethargy r/t a lack of sleep from his constant episodes of diarrhea. He calls the bathroom "my second bedroom".   At baseline, he takes a b12 supplement. He did not take a mvi or any oral supplements. He had been taking imodium, which did help extend how long he could go before having diarrhea. He would take this before leaving the  house.    He says he has lost 40 lbs since this started in May/June. He claims he weighed 238 lbs at that time. Per chart, he weighed 202 lbs 3 weeks ago, 216 lbs at the end of June and  236 lbs at this time last year. These are losses of 12.5 lbs (6%) x 3 weeks,  26.5 lbs (12.3%) x 4 months and 46.5 lbs (19.7%) x 1 year.   PHysical Exam: He has moderate wasting of deltoids clavicular musculature, deltoids, temporalis, interosseous. Moderate wasting of underarm and orbital fat pads. He says he has never been able to see the bones in his hands before  At this time, patient says he still has a good appetite. He does not have many food requests because he says all food affects him the same. He has tried the Ensure and likes it. Continue this. Would benefit from MVI, though will defer to MD given CKD4. Noted GI MD stated patient has malabsorptive syndrome, did not feel infectious etiology.  Labs: A1C: 5.6, H/H:9.0/28.6, Mag: 1.6, BUN:63/2.72, BG 114-142, Albumin: 3.1 Meds: Ensure Enlive, Vit D, PPI, Imodium, Sodium Bicarb, B12,  IVF  Recent Labs Lab 07/01/17 1012 07/05/17 1311 07/06/17 0700  NA 137 137 139  K 3.2* 3.2* 3.6  CL 109 112* 116*  CO2 16* 16* 13*  BUN 82* 72* 63*  CREATININE 3.25* 3.13* 2.72*  CALCIUM 8.7* 8.6* 8.4*  MG  --   --  1.6*  GLUCOSE 122* 127*  114*   Diet Order:  Diet Carb Modified Fluid consistency: Thin; Room service appropriate? Yes  Skin:Abrasions to Arms  Last BM:  10/16-diarrhea  Height:  Ht Readings from Last 1 Encounters:  07/05/17 6' (1.829 m)   Weight:  Wt Readings from Last 1 Encounters:  07/05/17 189 lb 6.4 oz (85.9 kg)   Wt Readings from Last 10 Encounters:  07/05/17 189 lb 6.4 oz (85.9 kg)  06/17/17 202 lb (91.6 kg)  04/07/17 215 lb (97.5 kg)  03/25/17 215 lb (97.5 kg)  03/19/17 215 lb (97.5 kg)  03/16/17 216 lb 12.8 oz (98.3 kg)  01/22/17 215 lb (97.5 kg)  12/31/16 216 lb 11.2 oz (98.3 kg)  12/14/16 220 lb 12.8 oz (100.2 kg)  11/24/16  215 lb (97.5 kg)   Ideal Body Weight:  80.91 kg  BMI:  Body mass index is 25.69 kg/m.  Estimated Nutritional Needs:  Kcal:  2000-2250 (23-26 kcal/kg bw) Protein:  90-105g Pro (1.1-1.3 g/kg ibw) Fluid:  Per MD  EDUCATION NEEDS:  No education needs identified at this time  Burtis Junes RD, LDN, [facility] Pager: [phone] 07/06/2017 1:48 PM

## 2017-07-07 DIAGNOSIS — R197 Diarrhea, unspecified: Secondary | ICD-10-CM

## 2017-07-07 DIAGNOSIS — E43 Unspecified severe protein-calorie malnutrition: Secondary | ICD-10-CM

## 2017-07-07 DIAGNOSIS — I482 Chronic atrial fibrillation: Secondary | ICD-10-CM

## 2017-07-07 DIAGNOSIS — R634 Abnormal weight loss: Secondary | ICD-10-CM

## 2017-07-07 DIAGNOSIS — N179 Acute kidney failure, unspecified: Secondary | ICD-10-CM

## 2017-07-07 DIAGNOSIS — I10 Essential (primary) hypertension: Secondary | ICD-10-CM

## 2017-07-07 LAB — COMPREHENSIVE METABOLIC PANEL
ALBUMIN: 2.9 g/dL — AB (ref 3.5–5.0)
ALT: 10 U/L — ABNORMAL LOW (ref 17–63)
AST: 14 U/L — AB (ref 15–41)
Alkaline Phosphatase: 63 U/L (ref 38–126)
Anion gap: 7 (ref 5–15)
BILIRUBIN TOTAL: 0.1 mg/dL — AB (ref 0.3–1.2)
BUN: 56 mg/dL — AB (ref 6–20)
CO2: 13 mmol/L — AB (ref 22–32)
Calcium: 8.4 mg/dL — ABNORMAL LOW (ref 8.9–10.3)
Chloride: 119 mmol/L — ABNORMAL HIGH (ref 101–111)
Creatinine, Ser: 2.29 mg/dL — ABNORMAL HIGH (ref 0.61–1.24)
GFR calc Af Amer: 30 mL/min — ABNORMAL LOW (ref >60)
GFR calc non Af Amer: 26 mL/min — ABNORMAL LOW (ref >60)
GLUCOSE: 124 mg/dL — AB (ref 65–99)
POTASSIUM: 3.8 mmol/L (ref 3.5–5.1)
SODIUM: 139 mmol/L (ref 135–145)
TOTAL PROTEIN: 7 g/dL (ref 6.5–8.1)

## 2017-07-07 LAB — GLUCOSE, CAPILLARY
GLUCOSE-CAPILLARY: 133 mg/dL — AB (ref 65–99)
GLUCOSE-CAPILLARY: 123 mg/dL — AB (ref 65–99)
Glucose-Capillary: 120 mg/dL — ABNORMAL HIGH (ref 65–99)
Glucose-Capillary: 131 mg/dL — ABNORMAL HIGH (ref 65–99)

## 2017-07-07 LAB — CBC WITH DIFFERENTIAL/PLATELET
BASOS ABS: 0 10*3/uL (ref 0.0–0.1)
Basophils Relative: 0 %
EOS PCT: 1 %
Eosinophils Absolute: 0.1 10*3/uL (ref 0.0–0.7)
HEMATOCRIT: 28.3 % — AB (ref 39.0–52.0)
Hemoglobin: 8.8 g/dL — ABNORMAL LOW (ref 13.0–17.0)
LYMPHS ABS: 1.7 10*3/uL (ref 0.7–4.0)
LYMPHS PCT: 27 %
MCH: 29.5 pg (ref 26.0–34.0)
MCHC: 31.1 g/dL (ref 30.0–36.0)
MCV: 95 fL (ref 78.0–100.0)
MONO ABS: 0.7 10*3/uL (ref 0.1–1.0)
MONOS PCT: 11 %
NEUTROS ABS: 3.9 10*3/uL (ref 1.7–7.7)
Neutrophils Relative %: 61 %
PLATELETS: 222 10*3/uL (ref 150–400)
RBC: 2.98 MIL/uL — ABNORMAL LOW (ref 4.22–5.81)
RDW: 16.9 % — AB (ref 11.5–15.5)
WBC: 6.4 10*3/uL (ref 4.0–10.5)

## 2017-07-07 LAB — FOLATE: Folate: 13.8 ng/mL (ref >5.9)

## 2017-07-07 LAB — TSH: TSH: 2.15 u[IU]/mL (ref 0.350–4.500)

## 2017-07-07 LAB — FECAL FAT, QUALITATIVE
FAT QUAL TOTAL STL: NORMAL
Fat Qual Neutral, Stl: NORMAL

## 2017-07-07 LAB — VITAMIN B12: Vitamin B-12: 981 pg/mL — ABNORMAL HIGH (ref 180–914)

## 2017-07-07 MED ORDER — PSYLLIUM 95 % PO PACK
1.0000 | PACK | Freq: Every day | ORAL | Status: DC
  Administered 2017-07-07 – 2017-07-08 (×2): 1 via ORAL
  Filled 2017-07-07 (×6): qty 1

## 2017-07-07 MED ORDER — MAGNESIUM SULFATE 4 GM/100ML IV SOLN
4.0000 g | Freq: Once | INTRAVENOUS | Status: AC
  Administered 2017-07-07: 4 g via INTRAVENOUS
  Filled 2017-07-07: qty 100

## 2017-07-07 MED ORDER — TRAZODONE HCL 50 MG PO TABS
25.0000 mg | ORAL_TABLET | Freq: Every evening | ORAL | Status: DC | PRN
  Administered 2017-07-07 – 2017-07-08 (×2): 25 mg via ORAL
  Filled 2017-07-07 (×2): qty 1

## 2017-07-07 MED ORDER — ZOLPIDEM TARTRATE 5 MG PO TABS
5.0000 mg | ORAL_TABLET | Freq: Once | ORAL | Status: AC
  Administered 2017-07-07: 5 mg via ORAL
  Filled 2017-07-07: qty 1

## 2017-07-07 NOTE — Progress Notes (Addendum)
PROGRESS NOTE  Stephen [name]  RSW:[id number] DOB: 04-02-1941 DOA: 07/05/2017 PCP: [name], MD  Brief Narrative:  Stephen [name] is a 76 y.o. male with medical history of diabetes mellitus, hypertension, persistent atrial fibrillation on apixiban, and CKD stage IV presenting with diarrhea since May. Prior to May, he had formed BM once daily.  Since then, applesauce-like stools 2-3 times per day with intermittent worsening.  He has been treated for C. Diff colitis with vancomycin with no improvement.  He was admitted with hypotension and AKI due to dehydration with ongoing diarrhea and continued to have stools 7-8 times overnight despite imodium yesterday.    Assessment & Plan:   Active Problems:   Essential hypertension, benign   Chronic diastolic heart failure (HCC)   PAD (peripheral artery disease) (HCC)   Acute renal failure superimposed on stage 4 chronic kidney disease (HCC)   Permanent atrial fibrillation (HCC)   Diarrhea   Protein-calorie malnutrition, severe   Enteropathogenic Escherichia coli infection   Abnormal weight loss  Chronic diarrhea with 40-lb weight loss.  DDx includes malabsorptive syndrome, malignancy, Whipples.   -  Although his stool was positive for EHEC, I doubt that is the cause of his chronic symptoms -  Agree with fecal fat and celiac panel -  Check TSH -  Urine 5-HIAA  -  Stop PPI -  May need sigmoidoscopy if not improving -  Imodium per GI -  Add metamucil   Severe protein calorie malnutrition -  Liberalized diet -  Supplements -  Appreciate nutrition assistance  Acute on chronic renal failure--CKD stage IV, baseline creatinine 2.3-2.4 --Serum creatinine peaked at 3.25 on 09/38/1829  Chronic diastolic heart failure with ankle edema - d/c IV fluids - continue holding furosemide -continue home dose of bicarbonate  Paroxysmal atrial fibrillation -Rate controlled -Continue apixiban -  Okay to d/c telemetry  Hypertension, blood  pressures improved, but does not need to resume BP medications yet -continue holding atenolol and Cardura   Diabetes mellitus type 2, A1c 5.6, at risk for hypoglycemia, CBG currently well controlled -Hemoglobin A1c -Reduced dose of Levemir -NovoLog sliding scale  Coronary artery disease -No anginal symptoms -Continue aspirin -Status post angioplasty RCA  Hyperlipidemia -Continue statin  Hypokalemia, resolved with supplementation  Hypomagnesemia -  IV magnesium repletion   Globulin gap -  Check HIV, Hepatitis C, SPEP  DVT prophylaxis:  eliquis Code Status:  Full code Family Communication:  Patient and his daughter who was at bedside Disposition Plan:  Pending improvement in diarrhea   Consultants:   Gastroenterology, Dr. Laural Golden   Procedures:  none  Antimicrobials:  Anti-infectives    None       Subjective: Up 7-8 times overnight with small amounts of applesauce consistency stools.  Can't sleep well due to diarrhea and very tired.  Denies abdominal pains, obvious blood in stools.    Objective: Vitals:   07/06/17 0504 07/06/17 1607 07/06/17 2157 07/07/17 0501  BP: (!) 119/56 (!) 125/43 (!) 122/58 (!) 134/52  Pulse: 81 85 80 80  Resp: 18  18 20   Temp: 98.3 F (36.8 C)  98 F (36.7 C) 98.2 F (36.8 C)  TempSrc: Oral  Oral Oral  SpO2: 99%  98% 100%  Weight:      Height:        Intake/Output Summary (Last 24 hours) at 07/07/17 1102 Last data filed at 07/07/17 0533  Gross per 24 hour  Intake  2003.75 ml  Output              300 ml  Net          1703.75 ml   Filed Weights   07/05/17 1252 07/05/17 1957  Weight: 85.7 kg (189 lb) 85.9 kg (189 lb 6.4 oz)    Examination:  General exam:  Adult male.  No acute distress.  HEENT:  NCAT, MMM Respiratory system: Clear to auscultation bilaterally Cardiovascular system:  Bigeminy, normal S1/S2. No murmurs, rubs, gallops or clicks.  Warm extremities Gastrointestinal system: Hyperactive bowel  sounds, soft, nondistended, nontender. MSK:  Normal tone and bulk, 2+ pitting bilateral lower extremity edema Neuro:  Grossly intact    Data Reviewed: I have personally reviewed following labs and imaging studies  CBC:  Recent Labs Lab 07/01/17 1012 07/05/17 1311 07/06/17 0700 07/07/17 0628  WBC 5.8 6.6 6.0 6.4  NEUTROABS 3.5  --   --  3.9  HGB 9.8* 9.5* 9.0* 8.8*  HCT 30.6* 29.8* 28.6* 28.3*  MCV 92.7 93.7 93.5 95.0  PLT 224 249 223 657   Basic Metabolic Panel:  Recent Labs Lab 07/01/17 1012 07/05/17 1311 07/06/17 0700 07/07/17 0628  NA 137 137 139 139  K 3.2* 3.2* 3.6 3.8  CL 109 112* 116* 119*  CO2 16* 16* 13* 13*  GLUCOSE 122* 127* 114* 124*  BUN 82* 72* 63* 56*  CREATININE 3.25* 3.13* 2.72* 2.29*  CALCIUM 8.7* 8.6* 8.4* 8.4*  MG  --   --  1.6*  --    GFR: Estimated Creatinine Clearance: 30.1 mL/min (A) (by C-G formula based on SCr of 2.29 mg/dL (H)). Liver Function Tests:  Recent Labs Lab 07/01/17 1012 07/05/17 1311 07/07/17 0628  AST 13* 15 14*  ALT 13* 13* 10*  ALKPHOS 73 76 63  BILITOT 0.6 0.2* 0.1*  PROT 7.8 7.5 7.0  ALBUMIN 3.0* 3.1* 2.9*    Recent Labs Lab 07/05/17 1311  LIPASE 54*   No results for input(s): AMMONIA in the last 168 hours. Coagulation Profile: No results for input(s): INR, PROTIME in the last 168 hours. Cardiac Enzymes:  Recent Labs Lab 07/05/17 1954  CKTOTAL 43*   BNP (last 3 results) No results for input(s): PROBNP in the last 8760 hours. HbA1C:  Recent Labs  07/06/17 0702  HGBA1C 5.6   CBG:  Recent Labs Lab 07/06/17 0741 07/06/17 1129 07/06/17 1639 07/06/17 2157 07/07/17 0800  GLUCAP 114* 142* 131* 108* 120*   Lipid Profile: No results for input(s): CHOL, HDL, LDLCALC, TRIG, CHOLHDL, LDLDIRECT in the last 72 hours. Thyroid Function Tests: No results for input(s): TSH, T4TOTAL, FREET4, T3FREE, THYROIDAB in the last 72 hours. Anemia Panel: No results for input(s): VITAMINB12, FOLATE,  FERRITIN, TIBC, IRON, RETICCTPCT in the last 72 hours. Urine analysis:    Component Value Date/Time   COLORURINE YELLOW 07/05/2017 [place] 07/05/2017 1251   LABSPEC 1.011 07/05/2017 1251   PHURINE 5.0 07/05/2017 1251   GLUCOSEU NEGATIVE 07/05/2017 1251   HGBUR MODERATE (A) 07/05/2017 1251   BILIRUBINUR NEGATIVE 07/05/2017 1251   KETONESUR NEGATIVE 07/05/2017 1251   PROTEINUR NEGATIVE 07/05/2017 1251   UROBILINOGEN 0.2 07/05/2008 0922   NITRITE NEGATIVE 07/05/2017 1251   LEUKOCYTESUR NEGATIVE 07/05/2017 1251   Sepsis Labs: @LABRCNTIP (procalcitonin:4,lacticidven:4)  ) Recent Results (from the past 240 hour(s))  Gastrointestinal Panel by PCR , Stool     Status: Abnormal   Collection Time: 07/05/17  2:56 PM  Result Value Ref Range Status  Campylobacter species NOT DETECTED NOT DETECTED Final   Plesimonas shigelloides NOT DETECTED NOT DETECTED Final   Salmonella species NOT DETECTED NOT DETECTED Final   Yersinia enterocolitica NOT DETECTED NOT DETECTED Final   Vibrio species NOT DETECTED NOT DETECTED Final   Vibrio cholerae NOT DETECTED NOT DETECTED Final   Enteroaggregative E coli (EAEC) NOT DETECTED NOT DETECTED Final   Enteropathogenic E coli (EPEC) DETECTED (A) NOT DETECTED Final    Comment: RESULT CALLED TO, READ BACK BY AND VERIFIED WITH: MARYBETH HAWKINS 07/06/17 1708 KLW    Enterotoxigenic E coli (ETEC) NOT DETECTED NOT DETECTED Final   Shiga like toxin producing E coli (STEC) NOT DETECTED NOT DETECTED Final   Shigella/Enteroinvasive E coli (EIEC) NOT DETECTED NOT DETECTED Final   Cryptosporidium NOT DETECTED NOT DETECTED Final   Cyclospora cayetanensis NOT DETECTED NOT DETECTED Final   Entamoeba histolytica NOT DETECTED NOT DETECTED Final   Giardia lamblia NOT DETECTED NOT DETECTED Final   Adenovirus F40/41 NOT DETECTED NOT DETECTED Final   Astrovirus NOT DETECTED NOT DETECTED Final   Norovirus GI/GII NOT DETECTED NOT DETECTED Final   Rotavirus A  NOT DETECTED NOT DETECTED Final   Sapovirus (I, II, IV, and V) NOT DETECTED NOT DETECTED Final  C difficile quick scan w PCR reflex     Status: None   Collection Time: 07/05/17  3:03 PM  Result Value Ref Range Status   C Diff antigen NEGATIVE NEGATIVE Final   C Diff toxin NEGATIVE NEGATIVE Final   C Diff interpretation No C. difficile detected.  Final      Radiology Studies: No results found.   Scheduled Meds: . allopurinol  200 mg Oral Daily  . apixaban  5 mg Oral BID  . aspirin  81 mg Oral Daily  . cholecalciferol  1,000 Units Oral Daily  . feeding supplement (ENSURE ENLIVE)  237 mL Oral BID BM  . insulin aspart  0-15 Units Subcutaneous TID WC  . insulin aspart  0-5 Units Subcutaneous QHS  . insulin detemir  25 Units Subcutaneous QHS  . loperamide  2 mg Oral TID AC  . pantoprazole  40 mg Oral Daily  . pravastatin  40 mg Oral q1800  . sodium bicarbonate  650 mg Oral TID  . vitamin B-12  2,000 mcg Oral Daily   Continuous Infusions:   LOS: 2 days    Time spent: 30 min    Janece Canterbury, MD [facility] Pager [phone]  If 7PM-7AM, please contact night-coverage [email or web address] Password [email or web address] 07/07/2017, 11:02 AM

## 2017-07-07 NOTE — Progress Notes (Signed)
Pt c/o not being able to sleep, pt stated tylenol usually helps but not tonight. Dr. Hilbert Bible paged and made aware. RN requesting something for pt to sleep. Waiting for orders/ call back. Will continue to monitor pt

## 2017-07-07 NOTE — Progress Notes (Addendum)
Patient ID: Stephen [name], male   DOB: 1941/05/27, 76 y.o.   MRN: [id number] States he does not feel any better. Had multiple stools during the night. Stools watery. States every 30 minutes he has loose,watery stool. Stools 5-6 last night.  He ate all of dinner last night. No vomiting. Stools studies are pending. Diarrhea for 5 months. Last colonoscopy in 2016 which was normal. No antibiotics since May.  Blood pressure (!) 134/52, pulse 80, temperature 98.2 F (36.8 C), temperature source Oral, resp. rate 20, height 6' (1.829 m), weight 189 lb 6.4 oz (85.9 kg), SpO2 100 %.    GI attending note:  Patient remains with diarrhea. He states he is at 10-12 stools in last 24 hours. He remains free of abdominal pain. He has good appetite. GI pathogen positive for EPEC which is not felt to be cause of his diarrhea. Neither doesn't need to be treated. Suspect diarrhea is due to malabsorption. Celiac antibody panel and qualitative fecal fat analysis pending. Dr. Sheran Fava is also doing studies to rule out thyrotoxicosis and carcinoid. However diarrhea does not appear to be secretory diarrhea. Will proceed with 24-hour stool collection starting tomorrow morning for weight, fecal fat electrolytes and osmolality. If celiac antibody panel or qualitative fecal fat positive will proceed with duodenal biopsy. Continue loperamide at current dose for now.

## 2017-07-08 ENCOUNTER — Ambulatory Visit (HOSPITAL_COMMUNITY): Payer: PPO

## 2017-07-08 DIAGNOSIS — I5032 Chronic diastolic (congestive) heart failure: Secondary | ICD-10-CM

## 2017-07-08 DIAGNOSIS — N184 Chronic kidney disease, stage 4 (severe): Secondary | ICD-10-CM

## 2017-07-08 LAB — HIV ANTIBODY (ROUTINE TESTING W REFLEX): HIV SCREEN 4TH GENERATION: NONREACTIVE

## 2017-07-08 LAB — BASIC METABOLIC PANEL
ANION GAP: 6 (ref 5–15)
BUN: 49 mg/dL — AB (ref 6–20)
CALCIUM: 8.3 mg/dL — AB (ref 8.9–10.3)
CO2: 13 mmol/L — ABNORMAL LOW (ref 22–32)
Chloride: 118 mmol/L — ABNORMAL HIGH (ref 101–111)
Creatinine, Ser: 1.94 mg/dL — ABNORMAL HIGH (ref 0.61–1.24)
GFR calc Af Amer: 37 mL/min — ABNORMAL LOW (ref >60)
GFR, EST NON AFRICAN AMERICAN: 32 mL/min — AB (ref >60)
GLUCOSE: 99 mg/dL (ref 65–99)
Potassium: 4 mmol/L (ref 3.5–5.1)
SODIUM: 137 mmol/L (ref 135–145)

## 2017-07-08 LAB — RETICULIN ANTIBODIES, IGA W TITER: Reticulin Ab, IgA: NEGATIVE titer (ref <2.5)

## 2017-07-08 LAB — PROTEIN ELECTROPHORESIS, SERUM
A/G RATIO SPE: 0.7 (ref 0.7–1.7)
ALPHA-2-GLOBULIN: 1 g/dL (ref 0.4–1.0)
Albumin ELP: 2.8 g/dL — ABNORMAL LOW (ref 2.9–4.4)
Alpha-1-Globulin: 0.3 g/dL (ref 0.0–0.4)
Beta Globulin: 0.9 g/dL (ref 0.7–1.3)
GLOBULIN, TOTAL: 3.8 g/dL (ref 2.2–3.9)
Gamma Globulin: 1.6 g/dL (ref 0.4–1.8)
Total Protein ELP: 6.6 g/dL (ref 6.0–8.5)

## 2017-07-08 LAB — GLIADIN ANTIBODIES, SERUM
Antigliadin Abs, IgA: 3 units (ref 0–19)
GLIADIN IGG: 2 U (ref 0–19)

## 2017-07-08 LAB — GLUCOSE, CAPILLARY
GLUCOSE-CAPILLARY: 92 mg/dL (ref 65–99)
Glucose-Capillary: 115 mg/dL — ABNORMAL HIGH (ref 65–99)
Glucose-Capillary: 131 mg/dL — ABNORMAL HIGH (ref 65–99)
Glucose-Capillary: 108 mg/dL — ABNORMAL HIGH (ref 65–99)

## 2017-07-08 LAB — TISSUE TRANSGLUTAMINASE, IGA: Tissue Transglutaminase Ab, IgA: <2 U/mL (ref 0–3)

## 2017-07-08 LAB — HEPATITIS C ANTIBODY: HCV Ab: <0.1 s/co ratio (ref 0.0–0.9)

## 2017-07-08 MED ORDER — INSULIN DETEMIR 100 UNIT/ML ~~LOC~~ SOLN
20.0000 [IU] | Freq: Every day | SUBCUTANEOUS | Status: DC
  Administered 2017-07-08: 20 [IU] via SUBCUTANEOUS
  Filled 2017-07-08 (×4): qty 0.2

## 2017-07-08 MED ORDER — LOPERAMIDE HCL 2 MG PO CAPS
2.0000 mg | ORAL_CAPSULE | Freq: Three times a day (TID) | ORAL | Status: DC
  Administered 2017-07-09: 2 mg via ORAL
  Filled 2017-07-08: qty 1

## 2017-07-08 NOTE — Progress Notes (Signed)
PROGRESS NOTE  Stephen [name]  JKD:[id number] DOB: [birth year] DOA: 07/05/2017 PCP: [name], MD  Brief Narrative:  Stephen [name] is a 76 y.o. male with medical history of diabetes mellitus, hypertension, persistent atrial fibrillation on apixiban, and CKD stage IV presenting with diarrhea since May. Prior to May, he had formed BM once daily.  Since then, applesauce-like stools 2-3 times per day with intermittent worsening.  He has been treated for C. Diff colitis with vancomycin with no improvement.  He was admitted with hypotension and AKI due to dehydration with ongoing diarrhea and had some improvement with imodium and metamucil.  Celiac panel is still pending.  Qualitative fecal fat was reported at normal, however, Dr. Laural Golden personally reviewed and felt like fat levels were elevated.  Awaiting 24h quantitative fecal fat which will be completed tomorrow.    Assessment & Plan:   Active Problems:   Essential hypertension, benign   Chronic diastolic heart failure (HCC)   PAD (peripheral artery disease) (HCC)   Acute renal failure superimposed on stage 4 chronic kidney disease (HCC)   Permanent atrial fibrillation (HCC)   Diarrhea   Protein-calorie malnutrition, severe   Enteropathogenic Escherichia coli infection   Abnormal weight loss  Chronic diarrhea with 40-lb weight loss.  DDx includes malabsorptive syndrome, malignancy, Whipples.   -  Although his stool was positive for EHEC, I doubt that is the cause of his chronic symptoms -  Agree with fecal fat 24h collection -  Celiac panel still pending -  TSH wnl -  Urine 5-HIAA collected today -  Stop PPI -  Resume Imodium after stool collection is complete -  Continue metamucil   Severe protein calorie malnutrition -  Liberalized diet -  Supplements -  Appreciate nutrition assistance  Acute on chronic renal failure--CKD stage IV, baseline creatinine 2.3-2.4 --Serum creatinine peaked at 3.25 on 09/98/3382  Chronic diastolic  heart failure with ankle edema - resume once daily furosemide tomorrow -continue home dose of bicarbonate  Paroxysmal atrial fibrillation, rate controlled - Continue apixiban -  Okay to d/c telemetry  Hypertension, blood pressures improved, but does not need to resume BP medications yet -continue holding atenolol and Cardura   Diabetes mellitus type 2, A1c 5.6, at risk for hypoglycemia, CBG currently well controlled -Reduce dose of Levemir to 20 units -NovoLog sliding scale  Coronary artery disease -No anginal symptoms -Continue aspirin -Status post angioplasty RCA  Hyperlipidemia -Continue statin  Hypokalemia, resolved with supplementation  Hypomagnesemia -  IV magnesium repletion   Globulin gap -  HIV NR, Hepatitis C negative, SPEP negative, M spike not observed  Anemia of renal disease and inflammation.  Folate, b12 and TSH wnl.  Ferritin elevated.    DVT prophylaxis:  eliquis Code Status:  Full code Family Communication:  Patient and his daughter who was at bedside Disposition Plan:  Likely home tomorrow after stool collection is complete.  Patient can be scheduled as outpatient for EGD with biopsy or any additional studies and will be advised when to hold his eliquis and aspirin prior to procedure.     Consultants:   Gastroenterology, Dr. Laural Golden   Procedures:  none  Antimicrobials:  Anti-infectives    None       Subjective: Only 2 stools overnight and 2 today and more formed than before.  Denies abdominal pain, nausea.    Objective: Vitals:   07/07/17 2004 07/07/17 2232 07/08/17 0517 07/08/17 1300  BP:  (!) 126/56 (!) 126/47 (!) 124/39  Pulse:  (!) 58 69 81  Resp:  20 15 16   Temp:  98.6 F (37 C) 98.9 F (37.2 C) 98.6 F (37 C)  TempSrc:  Oral Oral   SpO2: 100% 99% 98% 99%  Weight:      Height:        Intake/Output Summary (Last 24 hours) at 07/08/17 1714 Last data filed at 07/08/17 1300  Gross per 24 hour  Intake              720  ml  Output              301 ml  Net              419 ml   Filed Weights   07/05/17 1252 07/05/17 1957  Weight: 85.7 kg (189 lb) 85.9 kg (189 lb 6.4 oz)    Examination:  General exam:  Adult male.  No acute distress.  HEENT:  NCAT, MMM Respiratory system: Clear to auscultation bilaterally Cardiovascular system: Regular rate and rhythm, normal S1/S2. No murmurs, rubs, gallops or clicks.  Warm extremities Gastrointestinal system: Normal active bowel sounds, soft, nondistended, nontender. MSK:  Normal tone and bulk, 1+ pitting bilateral lower extremity edema Neuro:  Grossly intact    Data Reviewed: I have personally reviewed following labs and imaging studies  CBC:  Recent Labs Lab 07/05/17 1311 07/06/17 0700 07/07/17 0628  WBC 6.6 6.0 6.4  NEUTROABS  --   --  3.9  HGB 9.5* 9.0* 8.8*  HCT 29.8* 28.6* 28.3*  MCV 93.7 93.5 95.0  PLT 249 223 762   Basic Metabolic Panel:  Recent Labs Lab 07/05/17 1311 07/06/17 0700 07/07/17 0628 07/08/17 0649  NA 137 139 139 137  K 3.2* 3.6 3.8 4.0  CL 112* 116* 119* 118*  CO2 16* 13* 13* 13*  GLUCOSE 127* 114* 124* 99  BUN 72* 63* 56* 49*  CREATININE 3.13* 2.72* 2.29* 1.94*  CALCIUM 8.6* 8.4* 8.4* 8.3*  MG  --  1.6*  --   --    GFR: Estimated Creatinine Clearance: 35.6 mL/min (A) (by C-G formula based on SCr of 1.94 mg/dL (H)). Liver Function Tests:  Recent Labs Lab 07/05/17 1311 07/07/17 0628  AST 15 14*  ALT 13* 10*  ALKPHOS 76 63  BILITOT 0.2* 0.1*  PROT 7.5 7.0  ALBUMIN 3.1* 2.9*    Recent Labs Lab 07/05/17 1311  LIPASE 54*   No results for input(s): AMMONIA in the last 168 hours. Coagulation Profile: No results for input(s): INR, PROTIME in the last 168 hours. Cardiac Enzymes:  Recent Labs Lab 07/05/17 1954  CKTOTAL 43*   BNP (last 3 results) No results for input(s): PROBNP in the last 8760 hours. HbA1C:  Recent Labs  07/06/17 0702  HGBA1C 5.6   CBG:  Recent Labs Lab 07/07/17 1655  07/07/17 2002 07/08/17 0739 07/08/17 1158 07/08/17 1614  GLUCAP 123* 131* 92 115* 131*   Lipid Profile: No results for input(s): CHOL, HDL, LDLCALC, TRIG, CHOLHDL, LDLDIRECT in the last 72 hours. Thyroid Function Tests:  Recent Labs  07/07/17 1132  TSH 2.150   Anemia Panel:  Recent Labs  07/07/17 0631  VITAMINB12 981*  FOLATE 13.8   Urine analysis:    Component Value Date/Time   COLORURINE YELLOW 07/05/2017 [place] 07/05/2017 1251   LABSPEC 1.011 07/05/2017 1251   PHURINE 5.0 07/05/2017 1251   GLUCOSEU NEGATIVE 07/05/2017 1251   HGBUR MODERATE (A) 07/05/2017 1251   BILIRUBINUR  NEGATIVE 07/05/2017 1251   [place] 07/05/2017 1251   PROTEINUR NEGATIVE 07/05/2017 1251   UROBILINOGEN 0.2 07/05/2008 0922   NITRITE NEGATIVE 07/05/2017 1251   LEUKOCYTESUR NEGATIVE 07/05/2017 1251   Sepsis Labs: @LABRCNTIP (procalcitonin:4,lacticidven:4)  ) Recent Results (from the past 240 hour(s))  Gastrointestinal Panel by PCR , Stool     Status: Abnormal   Collection Time: 07/05/17  2:56 PM  Result Value Ref Range Status   Campylobacter species NOT DETECTED NOT DETECTED Final   Plesimonas shigelloides NOT DETECTED NOT DETECTED Final   Salmonella species NOT DETECTED NOT DETECTED Final   Yersinia enterocolitica NOT DETECTED NOT DETECTED Final   Vibrio species NOT DETECTED NOT DETECTED Final   Vibrio cholerae NOT DETECTED NOT DETECTED Final   Enteroaggregative E coli (EAEC) NOT DETECTED NOT DETECTED Final   Enteropathogenic E coli (EPEC) DETECTED (A) NOT DETECTED Final    Comment: RESULT CALLED TO, READ BACK BY AND VERIFIED WITH: MARYBETH HAWKINS 07/06/17 1708 KLW    Enterotoxigenic E coli (ETEC) NOT DETECTED NOT DETECTED Final   Shiga like toxin producing E coli (STEC) NOT DETECTED NOT DETECTED Final   Shigella/Enteroinvasive E coli (EIEC) NOT DETECTED NOT DETECTED Final   Cryptosporidium NOT DETECTED NOT DETECTED Final   Cyclospora cayetanensis NOT  DETECTED NOT DETECTED Final   Entamoeba histolytica NOT DETECTED NOT DETECTED Final   Giardia lamblia NOT DETECTED NOT DETECTED Final   Adenovirus F40/41 NOT DETECTED NOT DETECTED Final   Astrovirus NOT DETECTED NOT DETECTED Final   Norovirus GI/GII NOT DETECTED NOT DETECTED Final   Rotavirus A NOT DETECTED NOT DETECTED Final   Sapovirus (I, II, IV, and V) NOT DETECTED NOT DETECTED Final  C difficile quick scan w PCR reflex     Status: None   Collection Time: 07/05/17  3:03 PM  Result Value Ref Range Status   C Diff antigen NEGATIVE NEGATIVE Final   C Diff toxin NEGATIVE NEGATIVE Final   C Diff interpretation No C. difficile detected.  Final      Radiology Studies: No results found.   Scheduled Meds: . allopurinol  200 mg Oral Daily  . apixaban  5 mg Oral BID  . aspirin  81 mg Oral Daily  . cholecalciferol  1,000 Units Oral Daily  . feeding supplement (ENSURE ENLIVE)  237 mL Oral BID BM  . insulin aspart  0-15 Units Subcutaneous TID WC  . insulin aspart  0-5 Units Subcutaneous QHS  . insulin detemir  25 Units Subcutaneous QHS  . pravastatin  40 mg Oral q1800  . psyllium  1 packet Oral Daily  . sodium bicarbonate  650 mg Oral TID  . vitamin B-12  2,000 mcg Oral Daily   Continuous Infusions:   LOS: 3 days    Time spent: 30 min    Janece Canterbury, MD [facility] Pager [phone]  If 7PM-7AM, please contact night-coverage [email or web address] Password [email or web address] 07/08/2017, 5:14 PM

## 2017-07-08 NOTE — Progress Notes (Signed)
  Subjective:  Patient feels better. He remains with good appetite. He is eating more than three fourths of his meals. He denies abdominal pain. He did notice decrease in stool while him an improvement in consistency yesterday afternoon. He is collecting stool for 24 hours.  Objective: Blood pressure (!) 124/39, pulse 81, temperature 98.6 F (37 C), resp. rate 16, height 6' (1.829 m), weight 189 lb 6.4 oz (85.9 kg), SpO2 99 %. Patient is alert and in no acute distress. Abdomen remains soft and nontender without organomegaly or masses.  Labs/studies Results:   Recent Labs  07/06/17 0700 07/07/17 0628  WBC 6.0 6.4  HGB 9.0* 8.8*  HCT 28.6* 28.3*  PLT 223 222    BMET   Recent Labs  07/06/17 0700 07/07/17 0628 07/08/17 0649  NA 139 139 137  K 3.6 3.8 4.0  CL 116* 119* 118*  CO2 13* 13* 13*  GLUCOSE 114* 124* 99  BUN 63* 56* 49*  CREATININE 2.72* 2.29* 1.94*  CALCIUM 8.4* 8.4* 8.3*    LFT   Recent Labs  07/07/17 0628  PROT 7.0  ALBUMIN 2.9*  AST 14*  ALT 10*  ALKPHOS 63  BILITOT 0.1*      Recent Labs  07/07/17 1202  HCVAB <0.1    Folate level 13.8 B12 level 981 Qualitative fecal fat negative. Celiac antibody panel negative.   Assessment:  #1. Diarrhea of 5 months duration.he is suspected to have malabsorptive syndrome given 40 pound weight loss. He has been treated for C. Difficile based on positive stool study but there is no doubt that he does not have C. Difficile colitis or for that matter Escherichia coli infection. 24-hour stool collection is under very to check for fat electrolytes and a molality. He may need duodenal and sigmoid colon biopsy which will be arranged once stool studies back.  Recommendations:  Resume Imodium at 2 mg by mouth 3 times a day starting tomorrow. He should be able to go home tomorrow. Will arrange for EGD and sigmoidoscopy if necessary.  Discussed patient's condition with Dr. Edwin Cap earlier today

## 2017-07-09 ENCOUNTER — Encounter (INDEPENDENT_AMBULATORY_CARE_PROVIDER_SITE_OTHER): Payer: Self-pay | Admitting: Internal Medicine

## 2017-07-09 DIAGNOSIS — A040 Enteropathogenic Escherichia coli infection: Secondary | ICD-10-CM

## 2017-07-09 LAB — BASIC METABOLIC PANEL
Anion gap: 7 (ref 5–15)
BUN: 48 mg/dL — ABNORMAL HIGH (ref 6–20)
CALCIUM: 8.5 mg/dL — AB (ref 8.9–10.3)
CO2: 14 mmol/L — AB (ref 22–32)
CREATININE: 1.95 mg/dL — AB (ref 0.61–1.24)
Chloride: 116 mmol/L — ABNORMAL HIGH (ref 101–111)
GFR calc Af Amer: 37 mL/min — ABNORMAL LOW (ref >60)
GFR calc non Af Amer: 32 mL/min — ABNORMAL LOW (ref >60)
GLUCOSE: 93 mg/dL (ref 65–99)
Potassium: 4.3 mmol/L (ref 3.5–5.1)
Sodium: 137 mmol/L (ref 135–145)

## 2017-07-09 LAB — GLUCOSE, CAPILLARY: Glucose-Capillary: 85 mg/dL (ref 65–99)

## 2017-07-09 MED ORDER — HYDROCORTISONE 2.5 % RE CREA
TOPICAL_CREAM | Freq: Two times a day (BID) | RECTAL | Status: DC
  Filled 2017-07-09: qty 28.35

## 2017-07-09 MED ORDER — INSULIN DETEMIR 100 UNIT/ML ~~LOC~~ SOLN: 20.0000 [IU] | Freq: Every day | SUBCUTANEOUS | 11 refills | Status: DC

## 2017-07-09 MED ORDER — VITAMIN B-12 1000 MCG PO TABS: 1000.0000 ug | ORAL_TABLET | Freq: Every day | ORAL | 5 refills | Status: DC

## 2017-07-09 MED ORDER — LOPERAMIDE HCL 2 MG PO CAPS: 2.0000 mg | ORAL_CAPSULE | Freq: Three times a day (TID) | ORAL | 0 refills | Status: DC

## 2017-07-09 MED ORDER — PSYLLIUM 95 % PO PACK: 1.0000 | PACK | Freq: Every day | ORAL | 0 refills | Status: DC

## 2017-07-09 MED ORDER — ENSURE ENLIVE PO LIQD: 237.0000 mL | Freq: Two times a day (BID) | ORAL | 0 refills | Status: DC

## 2017-07-09 MED ORDER — FUROSEMIDE 40 MG PO TABS: 40.0000 mg | ORAL_TABLET | Freq: Every day | ORAL | 2 refills | Status: DC | PRN

## 2017-07-09 NOTE — Progress Notes (Signed)
IV removed, site WNL.  Pt given d/c instructions, new prescriptions called in to pharmacy.  Discussed all home medications (when, how, and why to take), patient verbalizes understanding. Discussed home care with patient, teachback completed. F/U appointments in place, pt states they will keep appointment. Pt is stable at this time. Pt taken to main entrance in wheelchair by staff member.

## 2017-07-09 NOTE — Discharge Summary (Signed)
Physician Discharge Summary  Stephen [name] [facility] WVP:[id number] DOB: 1941-02-28 DOA: 07/05/2017  PCP: [name], MD  Admit date: 07/05/2017 Discharge date: 07/09/2017  Admitted From: home  Disposition:  home  Recommendations for Outpatient Follow-up:  1. Follow up with Dr. Laural Golden in 1-2 weeks 2. Please obtain BMP/CBC in one week 3. Please follow up on the following pending results:  Qualitative fecal fat, stool sodium, potassium, osmolality, and 5 HIAA  Home Health:  none Equipment/Devices:  None  Discharge Condition:  Stable, improved CODE STATUS:  Full code  Diet recommendation:  Regular diet with supplements   Brief/Interim Summary:  Stephen Casagrande Smitheyis a 75 y.o.malewith medical history of diabetes mellitus, hypertension, persistent atrial fibrillation on apixiban, and CKD stage IV who presented with diarrhea since May. Prior to May, he had a formed BM once daily.  Since then, he has had applesauce-like stools 2-3 times per day with intermittent worsening.  He has been treated for C. Diff colitis with vancomycin with no improvement.  He was admitted with hypotension and AKI due to dehydration with ongoing diarrhea and had some improvement with imodium and metamucil.  Celiac panel was negative.  Qualitative fecal fat was reported at normal, however, Dr. Laural Golden personally reviewed and felt like fat levels were elevated.  24h quantitative fecal fat has been collected and results are pending.  Additional tests that are pending are listed above.  Patient's diarrhea improved with scheduled imodium and metamucil.  His acute on chronic kidney injury and hypotension resolved with IVF and holding diuretics and some of his other blood pressure medications.  Because his blood pressures are still somewhat low and he is still at risk for dehydration, I recommended changing his lasix for now to prn edema or 2-lb weight gain.  I stopped his beta blocker and doxazosin.  These medications can be adjusted by his  cardiologist or PCP if needed.  Also, he required considerably less insulin than what had been prescribed, likely because of his ongoing weight loss.  I have stopped his prn aspart because he was only receiving a couple of units of sliding scale during the day and his levemir was reduced as well.  His PCP can make additional changes as needed.  For his diarrhea, he will follow up with Dr. Laural Golden in a few weeks to discuss further diagnostic steps and to review any pending labs.    Discharge Diagnoses:  Active Problems:   Essential hypertension, benign   Chronic diastolic heart failure (HCC)   PAD (peripheral artery disease) (HCC)   Acute renal failure superimposed on stage 4 chronic kidney disease (HCC)   Permanent atrial fibrillation (HCC)   Diarrhea   Protein-calorie malnutrition, severe   Enteropathogenic Escherichia coli infection   Abnormal weight loss  Chronic diarrhea with 40-lb weight loss.  DDx includes malabsorptive syndrome, malignancy, Whipples.   -  Although his stool was positive for EHEC, I doubt that is the cause of his chronic symptoms -  f/u fecal fat 24h collection -  Celiac panel negative -  TSH wnl -  Urine 5-HIAA pending -  Stopped PPI -  started scheduled Imodium and metamucil  Severe protein calorie malnutrition -  Regular diet -  Supplements -  Appreciate nutrition assistance  Acute on chronic renal failure--CKD stage IV, baseline creatinine 2.3-2.4 --Serum creatinine peaked at 3.25 on 07/01/2017 and was better than baseline at time of discharge  Chronic diastolic heart failure with ankle edema, but no SOB - reduced furosemide to 40mg   once daily as needed for increased edema, SOB, or 2-lb weight gain -continue home dose of bicarbonate  Paroxysmal atrial fibrillation, rate controlled - Continue apixiban  Hypertension, blood pressures improved, but still low normal - discontinued atenolol and Cardura.  Can be resumed as an outpatient  Diabetes  mellitus type 2, A1c 5.6, at risk for hypoglycemia, CBG currently well controlled - Reduced dose of Levemir to 20 units - Discontinued aspart to prevent hypoglycemia at home -  May need to reduce levemir even further if he has ongoing weight loss  Coronary artery disease -No anginal symptoms -Continue aspirin and statin -Status post angioplasty RCA  Hypokalemia, resolved with supplementation  Hypomagnesemia, given IV magnesium repletion   Globulin gap -  HIV NR, Hepatitis C negative, SPEP negative, M spike not observed  Anemia of renal disease and inflammation.  Folate, b12 and TSH wnl.  Ferritin elevated.   Discharge Instructions  Discharge Instructions    (HEART FAILURE PATIENTS) Call MD:  Anytime you have any of the following symptoms: 1) 3 pound weight gain in 24 hours or 5 pounds in 1 week 2) shortness of breath, with or without a dry hacking cough 3) swelling in the hands, feet or stomach 4) if you have to sleep on extra pillows at night in order to breathe.    Complete by:  As directed    Call MD for:  persistant dizziness or light-headedness    Complete by:  As directed    Diet Carb Modified    Complete by:  As directed    Increase activity slowly    Complete by:  As directed        Medication List    STOP taking these medications   atenolol 50 MG tablet Commonly known as:  TENORMIN   doxazosin 2 MG tablet Commonly known as:  CARDURA   insulin aspart 100 UNIT/ML injection Commonly known as:  novoLOG   PREVACID PO   vancomycin 125 MG capsule Commonly known as:  VANCOCIN     TAKE these medications   acetaminophen 500 MG tablet Commonly known as:  TYLENOL Take 500 mg by mouth every 6 (six) hours as needed.   allopurinol 100 MG tablet Commonly known as:  ZYLOPRIM Take 200 mg by mouth daily.   apixaban 5 MG Tabs tablet Commonly known as:  ELIQUIS Take 1 tablet (5 mg total) by mouth 2 (two) times daily.   aspirin 81 MG tablet Take 81 mg by mouth  daily.   feeding supplement (ENSURE ENLIVE) Liqd Take 237 mLs by mouth 2 (two) times daily between meals.   furosemide 40 MG tablet Commonly known as:  LASIX Take 1 tablet (40 mg total) by mouth daily as needed for fluid or edema (or more than 2-lbs of weight gain). What changed:  when to take this  reasons to take this  additional instructions   insulin detemir 100 UNIT/ML injection Commonly known as:  LEVEMIR Inject 0.2 mLs (20 Units total) into the skin at bedtime. What changed:  how much to take   loperamide 2 MG capsule Commonly known as:  IMODIUM Take 1 capsule (2 mg total) by mouth 3 (three) times daily before meals.   pravastatin 40 MG tablet Commonly known as:  PRAVACHOL Take 40 mg by mouth daily.   psyllium 95 % Pack Commonly known as:  HYDROCIL/METAMUCIL Take 1 packet by mouth daily.   sodium bicarbonate 650 MG tablet Take 650 mg by mouth 3 (three) times daily.  vitamin B-12 1000 MCG tablet Commonly known as:  CYANOCOBALAMIN Take 1 tablet (1,000 mcg total) by mouth daily. Take 2 tabs by mouth daily. What changed:  how much to take  how to take this  when to take this   Vitamin D-3 1000 units Caps Take 1 capsule by mouth daily.      Follow-up Information    Rehman, Mechele Dawley, MD. Schedule an appointment as soon as possible for a visit in 2 week(s).   Specialty:  Gastroenterology Why:  Call office to schedule hospital follow-up Contact information: [address], [address] [place] [address] [phone]        [name], MD Follow up.   Specialty:  Internal Medicine Why:  Call to schedule hospital follow-up within one week. Contact information: [address] [place] [address] [phone]          No Known Allergies  Consultations: Dr. Laural Golden, Gastroenterology   Procedures/Studies: No results found.    Subjective: Had 2 stools overnight, still more formed than before and feeling well today.  Swelling has  been stable to improved without lasix.  Denies SOB.  Denies abdominal pains.  Having some hemorrhoid discomfort.    Discharge Exam: Vitals:   07/08/17 2120 07/09/17 0548  BP: (!) 123/49 (!) 117/49  Pulse: 84 80  Resp: 18 18  Temp: 98.2 F (36.8 C) 97.7 F (36.5 C)  SpO2: 99% 100%   Vitals:   07/08/17 1300 07/08/17 2034 07/08/17 2120 07/09/17 0548  BP: (!) 124/39  (!) 123/49 (!) 117/49  Pulse: 81  84 80  Resp: 16  18 18   Temp: 98.6 F (37 C)  98.2 F (36.8 C) 97.7 F (36.5 C)  TempSrc:   Oral Oral  SpO2: 99% 98% 99% 100%  Weight:      Height:        General: Pt is alert, awake, not in acute distress Cardiovascular: IRRR, S1/S2 +, no rubs, no gallops Respiratory: CTA bilaterally, no wheezing, no rhonchi Abdominal: Soft, NT, ND, bowel sounds + Extremities: 1+ pitting bilateral lower extremity edema, no cyanosis    The results of significant diagnostics from this hospitalization (including imaging, microbiology, ancillary and laboratory) are listed below for reference.     Microbiology: Recent Results (from the past 240 hour(s))  Gastrointestinal Panel by PCR , Stool     Status: Abnormal   Collection Time: 07/05/17  2:56 PM  Result Value Ref Range Status   Campylobacter species NOT DETECTED NOT DETECTED Final   Plesimonas shigelloides NOT DETECTED NOT DETECTED Final   Salmonella species NOT DETECTED NOT DETECTED Final   Yersinia enterocolitica NOT DETECTED NOT DETECTED Final   Vibrio species NOT DETECTED NOT DETECTED Final   Vibrio cholerae NOT DETECTED NOT DETECTED Final   Enteroaggregative E coli (EAEC) NOT DETECTED NOT DETECTED Final   Enteropathogenic E coli (EPEC) DETECTED (A) NOT DETECTED Final    Comment: RESULT CALLED TO, READ BACK BY AND VERIFIED WITH: MARYBETH HAWKINS 07/06/17 1708 KLW    Enterotoxigenic E coli (ETEC) NOT DETECTED NOT DETECTED Final   Shiga like toxin producing E coli (STEC) NOT DETECTED NOT DETECTED Final   Shigella/Enteroinvasive E  coli (EIEC) NOT DETECTED NOT DETECTED Final   Cryptosporidium NOT DETECTED NOT DETECTED Final   Cyclospora cayetanensis NOT DETECTED NOT DETECTED Final   Entamoeba histolytica NOT DETECTED NOT DETECTED Final   Giardia lamblia NOT DETECTED NOT DETECTED Final   Adenovirus F40/41 NOT DETECTED NOT DETECTED Final  Astrovirus NOT DETECTED NOT DETECTED Final   Norovirus GI/GII NOT DETECTED NOT DETECTED Final   Rotavirus A NOT DETECTED NOT DETECTED Final   Sapovirus (I, II, IV, and V) NOT DETECTED NOT DETECTED Final  C difficile quick scan w PCR reflex     Status: None   Collection Time: 07/05/17  3:03 PM  Result Value Ref Range Status   C Diff antigen NEGATIVE NEGATIVE Final   C Diff toxin NEGATIVE NEGATIVE Final   C Diff interpretation No C. difficile detected.  Final     Labs: BNP (last 3 results) No results for input(s): BNP in the last 8760 hours. Basic Metabolic Panel:  Recent Labs Lab 07/05/17 1311 07/06/17 0700 07/07/17 0628 07/08/17 0649 07/09/17 0703  NA 137 139 139 137 137  K 3.2* 3.6 3.8 4.0 4.3  CL 112* 116* 119* 118* 116*  CO2 16* 13* 13* 13* 14*  GLUCOSE 127* 114* 124* 99 93  BUN 72* 63* 56* 49* 48*  CREATININE 3.13* 2.72* 2.29* 1.94* 1.95*  CALCIUM 8.6* 8.4* 8.4* 8.3* 8.5*  MG  --  1.6*  --   --   --    Liver Function Tests:  Recent Labs Lab 07/05/17 1311 07/07/17 0628  AST 15 14*  ALT 13* 10*  ALKPHOS 76 63  BILITOT 0.2* 0.1*  PROT 7.5 7.0  ALBUMIN 3.1* 2.9*    Recent Labs Lab 07/05/17 1311  LIPASE 54*   No results for input(s): AMMONIA in the last 168 hours. CBC:  Recent Labs Lab 07/05/17 1311 07/06/17 0700 07/07/17 0628  WBC 6.6 6.0 6.4  NEUTROABS  --   --  3.9  HGB 9.5* 9.0* 8.8*  HCT 29.8* 28.6* 28.3*  MCV 93.7 93.5 95.0  PLT 249 223 222   Cardiac Enzymes:  Recent Labs Lab 07/05/17 1954  CKTOTAL 43*   BNP: Invalid input(s): POCBNP CBG:  Recent Labs Lab 07/08/17 0739 07/08/17 1158 07/08/17 1614 07/08/17 2119  07/09/17 0825  GLUCAP 92 115* 131* 108* 85   D-Dimer No results for input(s): DDIMER in the last 72 hours. Hgb A1c No results for input(s): HGBA1C in the last 72 hours. Lipid Profile No results for input(s): CHOL, HDL, LDLCALC, TRIG, CHOLHDL, LDLDIRECT in the last 72 hours. Thyroid function studies  Recent Labs  07/07/17 1132  TSH 2.150   Anemia work up  Recent Labs  07/07/17 0631  VITAMINB12 981*  FOLATE 13.8   Urinalysis    Component Value Date/Time   COLORURINE YELLOW 07/05/2017 [place] 07/05/2017 1251   LABSPEC 1.011 07/05/2017 1251   PHURINE 5.0 07/05/2017 1251   GLUCOSEU NEGATIVE 07/05/2017 1251   HGBUR MODERATE (A) 07/05/2017 1251   BILIRUBINUR NEGATIVE 07/05/2017 1251   KETONESUR NEGATIVE 07/05/2017 1251   PROTEINUR NEGATIVE 07/05/2017 1251   UROBILINOGEN 0.2 07/05/2008 0922   NITRITE NEGATIVE 07/05/2017 1251   LEUKOCYTESUR NEGATIVE 07/05/2017 1251   Sepsis Labs Invalid input(s): PROCALCITONIN,  WBC,  LACTICIDVEN   Time coordinating discharge: Over 30 minutes  SIGNED:   Janece Canterbury, MD  [facility] 07/09/2017, 12:04 PM Pager   If 7PM-7AM, please contact night-coverage [email or web address] Password [email or web address]

## 2017-07-09 NOTE — Care Management Note (Signed)
Case Management Note  Patient Details  Name: Stephen [name] MRN: [id number] Date of Birth: 1940-10-17  :   Expected Discharge Date:  07/09/17               Expected Discharge Plan:  Home/Self Care  In-House Referral:     Discharge planning Services  CM Consult  Post Acute Care Choice:  NA Choice offered to:  NA  DME Arranged:    DME Agency:     HH Arranged:    [place] Agency:     Status of Service:  Completed, signed off  If discussed at [employer] of Stay Meetings, dates discussed:    Additional Comments: Patient discharging today. From home alone, ind with ADL's. Has cane and RW at home, does not use. Still drives. Reports PT just walked with him and made no recommendations (no note yet). Pt. reports he would not have wanted HH anyway, nor will he be eligible as he is not home bound. No CM needs.  Mekesha Solomon, Chauncey Reading, RN 07/09/2017, 10:07 AM

## 2017-07-09 NOTE — Evaluation (Signed)
Physical Therapy Evaluation Patient Details Name: Stephen [name] MRN: [id number] DOB: 03-23-1941 Today's Date: 07/09/2017   History of Present Illness  Stephen [name] is a 76 y.o. male with medical history of diabetes mellitus, hypertension,persistent atrial fibrillation on apixiban, and CKD stage IV presenting with diarrhea for the better part of the past month. The patient was diagnosed with C. Difficile colitis approximately 3 weeks prior to this admission. The patient was given a ten-day course of po vancomycin which he finished. He stated that his diarrhea never improved all that much. The patient had a second stool test performed approximately 10 days prior to this admission. Per the patient's report, the second stool test was negative for C. Difficile. However because of concerns of possible persistent infection, the patient was given a second prolonged tapering course of po vancomycin. The patient started the second course of oral vancomycin on 07/01/2017. The patient denies any recent travels or sick contacts. He denies eating any raw or undercooked or exotic foods. He denies any unusual pets.    Clinical Impression  Patient functioning at baseline and demonstrates good return for gait in hallways and going up/down stairs.  Patient discharged from physical therapy to care of nursing for ambulation as tolerated for length of stay.    Follow Up Recommendations No PT follow up    Equipment Recommendations  None recommended by PT    Recommendations for Other Services       Precautions / Restrictions Precautions Precautions: Fall Restrictions Weight Bearing Restrictions: No      Mobility  Bed Mobility Overal bed mobility: Independent                Transfers Overall transfer level: Independent                  Ambulation/Gait Ambulation/Gait assistance: Independent Ambulation Distance (Feet): 100 Feet   Gait Pattern/deviations: WFL(Within Functional  Limits)   Gait velocity interpretation: at or above normal speed for age/gender    Stairs Stairs: Yes Stairs assistance: Modified independent (Device/Increase time)   Number of Stairs: 9 General stair comments: demonstrates good return for going up/down stairs  Wheelchair Mobility    Modified Rankin (Stroke Patients Only)       Balance Overall balance assessment: No apparent balance deficits (not formally assessed)                                           Pertinent Vitals/Pain Pain Assessment: No/denies pain    Home Living Family/patient expects to be discharged to:: Private residence Living Arrangements: Alone Available Help at Discharge: Family Type of Home: House Home Access: Stairs to enter Entrance Stairs-Rails: Right Entrance Stairs-Number of Steps: 2 Home Layout: One level Home Equipment: Walker - 2 wheels;Cane - single point;Shower seat;Bedside commode      Prior Function Level of Independence: Independent               Hand Dominance        Extremity/Trunk Assessment   Upper Extremity Assessment Upper Extremity Assessment: Overall WFL for tasks assessed    Lower Extremity Assessment Lower Extremity Assessment: Overall WFL for tasks assessed       Communication   Communication: No difficulties  Cognition Arousal/Alertness: Awake/alert Behavior During Therapy: WFL for tasks assessed/performed Overall Cognitive Status: Within Functional Limits for tasks assessed  General Comments      Exercises     Assessment/Plan    PT Assessment Patent does not need any further PT services  PT Problem List         PT Treatment Interventions      PT Goals (Current goals can be found in the Care Plan section)  Acute Rehab PT Goals Patient Stated Goal: return home PT Goal Formulation: With patient Time For Goal Achievement: 17-Jul-2017 Potential to Achieve Goals:  Good    Frequency     Barriers to discharge        Co-evaluation               AM-PAC PT "6 Clicks" Daily Activity  Outcome Measure Difficulty turning over in bed (including adjusting bedclothes, sheets and blankets)?: None Difficulty moving from lying on back to sitting on the side of the bed? : None Difficulty sitting down on and standing up from a chair with arms (e.g., wheelchair, bedside commode, etc,.)?: None Help needed moving to and from a bed to chair (including a wheelchair)?: None Help needed walking in hospital room?: None Help needed climbing 3-5 steps with a railing? : None 6 Click Score: 24    End of Session   Activity Tolerance: Patient tolerated treatment well Patient left: in bed (seated at bedside) Nurse Communication: Mobility status PT Visit Diagnosis: Unsteadiness on feet (R26.81);Other abnormalities of gait and mobility (R26.89);Muscle weakness (generalized) (M62.81)    Time: 0998-3382 PT Time Calculation (min) (ACUTE ONLY): 22 min   Charges:   PT Evaluation $PT Eval Low Complexity: 1 Low PT Treatments $Gait Training: 8-22 mins   PT G Codes:        11:34 AM, 07-17-17 Lonell Grandchild, MPT Physical Therapist with [facility] [phone] office [phone] mobile phone

## 2017-07-09 NOTE — Progress Notes (Signed)
  Subjective:  Patient states his hemorrhoids or back.He says he tried Preparation H at home and it did not help. He tells me that when he was on vancomycin hemorrhoids shrank but it did not help with diarrhea. He has good appetite. He denies abdominal pain.  Objective: Blood pressure (!) 117/49, pulse 80, temperature 97.7 F (36.5 C), temperature source Oral, resp. rate 18, height 6' (1.829 m), weight 189 lb 6.4 oz (85.9 kg), SpO2 100 %. Patient is alert. He is finishing up his breakfast.He has eaten 100% of his breakfast.  Labs/studies Results:   Recent Labs  07/07/17 0628  WBC 6.4  HGB 8.8*  HCT 28.3*  PLT 222    BMET   Recent Labs  07/07/17 0628 07/08/17 0649 07/09/17 0703  NA 139 137 137  K 3.8 4.0 4.3  CL 119* 118* 116*  CO2 13* 13* 14*  GLUCOSE 124* 99 93  BUN 56* 49* 48*  CREATININE 2.29* 1.94* 1.95*  CALCIUM 8.4* 8.3* 8.5*    LFT   Recent Labs  07/07/17 0628  PROT 7.0  ALBUMIN 2.9*  AST 14*  ALT 10*  ALKPHOS 63  BILITOT 0.1*    PT/INR  No results for input(s): LABPROT, INR in the last 72 hours.  Hepatitis Panel   Recent Labs  07/07/17 1202  HCVAB <0.1      Assessment:  #1. Diarrhea of 5 months duration associated with significant weight loss. Stool tests for C. Difficile was positive last month and he was treated with vancomycin but without symptomatic improvement. GI pathogen panel positive for enteric pathogenic Escherichia coli. It is obvious that he has neither.given his history I remain concerned that he has malabsorptive syndrome. He has completed 24-hour stool collection for fat electrolytes and a molality.Further testing will be delayed until stools results are available.he also has completed 24-hour urine collection for 5-HIAA.  #2. Anemia appears to be due to chronic disease.  #3. CKD.renal function appea to be back   Recommendations:  Resume loperamide 2 mg before each meal. Anusol HC 2.5% cream to be applied to hemorrhoids  twice a day until improved. I will contact patient with results of pending studies and further recommendations. Patient advised to use Dulcolax suppository or Fleet enema should he become constipated. He should refrain from using oral laxative.

## 2017-07-09 NOTE — Care Management Important Message (Signed)
Important Message  Patient Details  Name: Stephen [name] MRN: [id number] Date of Birth: [birth year]   Medicare Important Message Given:  Yes    Jonthan Leite, Chauncey Reading, RN 07/09/2017, 10:09 AM

## 2017-07-13 ENCOUNTER — Telehealth (INDEPENDENT_AMBULATORY_CARE_PROVIDER_SITE_OTHER): Payer: Self-pay | Admitting: Internal Medicine

## 2017-07-13 LAB — 5 HIAA, QUANTITATIVE, URINE, 24 HOUR
5-HIAA, Ur: 79.8 mg/L
5-HIAA,Quant.,24 Hr Urine: 47.9 mg/24 hr — ABNORMAL HIGH (ref 0.0–14.9)
TOTAL VOLUME: 600

## 2017-07-13 LAB — MISC LABCORP TEST (SEND OUT): Labcorp test code: 1354

## 2017-07-13 NOTE — Telephone Encounter (Signed)
I advised patient to stop the medication for now if he is not having diarrhea.

## 2017-07-13 NOTE — Telephone Encounter (Signed)
Patient's fiance Opal Sidles called and stated that the patient was given Loperamide for diarrhea, but he doesn't have diarrhea anymore.  She stated that he has lost his appetite and is really fatigue.  She wants to know if you think this mediation may be causing this and can they cut back on the dosage.  [phone]

## 2017-07-14 ENCOUNTER — Other Ambulatory Visit (HOSPITAL_COMMUNITY): Payer: Self-pay | Admitting: *Deleted

## 2017-07-14 DIAGNOSIS — N183 Chronic kidney disease, stage 3 unspecified: Secondary | ICD-10-CM

## 2017-07-14 DIAGNOSIS — D631 Anemia in chronic kidney disease: Secondary | ICD-10-CM

## 2017-07-15 ENCOUNTER — Encounter (HOSPITAL_COMMUNITY): Payer: Self-pay

## 2017-07-15 ENCOUNTER — Encounter (HOSPITAL_COMMUNITY): Payer: PPO

## 2017-07-15 ENCOUNTER — Encounter (HOSPITAL_BASED_OUTPATIENT_CLINIC_OR_DEPARTMENT_OTHER): Payer: PPO

## 2017-07-15 VITALS — BP 116/37 | HR 88 | Temp 97.9°F | Resp 18

## 2017-07-15 DIAGNOSIS — N183 Chronic kidney disease, stage 3 unspecified: Secondary | ICD-10-CM

## 2017-07-15 DIAGNOSIS — D631 Anemia in chronic kidney disease: Secondary | ICD-10-CM

## 2017-07-15 DIAGNOSIS — D509 Iron deficiency anemia, unspecified: Secondary | ICD-10-CM | POA: Diagnosis not present

## 2017-07-15 DIAGNOSIS — D508 Other iron deficiency anemias: Secondary | ICD-10-CM

## 2017-07-15 LAB — CBC WITH DIFFERENTIAL/PLATELET
BASOS ABS: 0 10*3/uL (ref 0.0–0.1)
Basophils Relative: 0 %
Eosinophils Absolute: 0.1 10*3/uL (ref 0.0–0.7)
Eosinophils Relative: 2 %
HEMATOCRIT: 31 % — AB (ref 39.0–52.0)
Hemoglobin: 9.5 g/dL — ABNORMAL LOW (ref 13.0–17.0)
LYMPHS ABS: 1.3 10*3/uL (ref 0.7–4.0)
LYMPHS PCT: 25 %
MCH: 29.6 pg (ref 26.0–34.0)
MCHC: 30.6 g/dL (ref 30.0–36.0)
MCV: 96.6 fL (ref 78.0–100.0)
MONO ABS: 0.7 10*3/uL (ref 0.1–1.0)
MONOS PCT: 12 %
NEUTROS ABS: 3.3 10*3/uL (ref 1.7–7.7)
Neutrophils Relative %: 61 %
Platelets: 202 10*3/uL (ref 150–400)
RBC: 3.21 MIL/uL — ABNORMAL LOW (ref 4.22–5.81)
RDW: 17.7 % — AB (ref 11.5–15.5)
WBC: 5.4 10*3/uL (ref 4.0–10.5)

## 2017-07-15 MED ORDER — SODIUM CHLORIDE 0.9 % IV SOLN: 750.0000 mg | Freq: Once | INTRAVENOUS | Status: DC

## 2017-07-15 MED ORDER — DARBEPOETIN ALFA 300 MCG/0.6ML IJ SOSY
PREFILLED_SYRINGE | INTRAMUSCULAR | Status: AC
  Filled 2017-07-15: qty 0.6

## 2017-07-15 MED ORDER — DARBEPOETIN ALFA 300 MCG/0.6ML IJ SOSY
300.0000 ug | PREFILLED_SYRINGE | Freq: Once | INTRAMUSCULAR | Status: AC
  Administered 2017-07-15: 300 ug via SUBCUTANEOUS

## 2017-07-15 NOTE — Patient Instructions (Signed)
[place] at [facility] Discharge Instructions  RECOMMENDATIONS MADE BY THE CONSULTANT AND ANY TEST RESULTS WILL BE SENT TO YOUR REFERRING PHYSICIAN.  You had your Aranesp injection today Your hemoglobin was 9.5 Follow up in 2 weeks for labs and injection  Thank you for choosing [place] at [facility] to provide your oncology and hematology care.  To afford each patient quality time with our provider, please arrive at least 15 minutes before your scheduled appointment time.    If you have a lab appointment with the [place] please come in thru the  Main Entrance and check in at the main information desk  You need to re-schedule your appointment should you arrive 10 or more minutes late.  We strive to give you quality time with our providers, and arriving late affects you and other patients whose appointments are after yours.  Also, if you no show three or more times for appointments you may be dismissed from the clinic at the providers discretion.     Again, thank you for choosing [facility].  Our hope is that these requests will decrease the amount of time that you wait before being seen by our physicians.       _____________________________________________________________  Should you have questions after your visit to [facility], please contact our office at [phone] between the hours of 8:30 a.m. and 4:30 p.m.  Voicemails left after 4:30 p.m. will not be returned until the following business day.  For prescription refill requests, have your pharmacy contact our office.       Resources For Cancer Patients and their Caregivers ? American Cancer Society: Can assist with transportation, wigs, general needs, runs Look Good Feel Better.        206-147-5079 ? Cancer Care: Provides financial assistance, online support groups, medication/co-pay assistance.  1-800-813-HOPE 872-209-8407) ? [place] Assists [place] cancer patients and their families through emotional , educational and financial support.  (662) 507-6710 ? [place] DSS Where to apply for food stamps, Medicaid and utility assistance. (801)189-4496 ? [facility]: Transportation to medical appointments. 208-245-8270 ? Social Security Administration: May apply for disability if have a Stage IV cancer. 858-506-8142 803-182-1025 ? [employer], Disability and Transit Services: Assists with nutrition, care and transit needs. [place] Support Programs: @10RELATIVEDAYS @ > Cancer Support Group  2nd Tuesday of the month 1pm-2pm, Journey Room  > Creative Journey  3rd Tuesday of the month 1130am-1pm, Journey Room  > Look Good Feel Better  1st Wednesday of the month 10am-12 noon, Journey Room (Call [place] to register [phone])

## 2017-07-15 NOTE — Progress Notes (Signed)
Les Pou presents today for injection per MD orders. Aranesp 300 mcg administered SQ in left lower abdomen. Administration without incident. Patient tolerated well. Patient discharged in stable condition via wheelchair with caregiver. Patient to follow up as scheduled.

## 2017-07-16 ENCOUNTER — Other Ambulatory Visit (INDEPENDENT_AMBULATORY_CARE_PROVIDER_SITE_OTHER): Payer: Self-pay | Admitting: *Deleted

## 2017-07-16 DIAGNOSIS — E1122 Type 2 diabetes mellitus with diabetic chronic kidney disease: Secondary | ICD-10-CM | POA: Diagnosis not present

## 2017-07-16 DIAGNOSIS — R634 Abnormal weight loss: Secondary | ICD-10-CM

## 2017-07-16 DIAGNOSIS — R197 Diarrhea, unspecified: Secondary | ICD-10-CM | POA: Diagnosis not present

## 2017-07-16 DIAGNOSIS — N184 Chronic kidney disease, stage 4 (severe): Secondary | ICD-10-CM | POA: Diagnosis not present

## 2017-07-16 DIAGNOSIS — E538 Deficiency of other specified B group vitamins: Secondary | ICD-10-CM

## 2017-07-16 DIAGNOSIS — E46 Unspecified protein-calorie malnutrition: Secondary | ICD-10-CM | POA: Diagnosis not present

## 2017-07-16 DIAGNOSIS — E43 Unspecified severe protein-calorie malnutrition: Secondary | ICD-10-CM

## 2017-07-22 ENCOUNTER — Encounter (INDEPENDENT_AMBULATORY_CARE_PROVIDER_SITE_OTHER): Payer: Self-pay | Admitting: Internal Medicine

## 2017-07-22 ENCOUNTER — Ambulatory Visit (INDEPENDENT_AMBULATORY_CARE_PROVIDER_SITE_OTHER): Payer: PPO | Admitting: Internal Medicine

## 2017-07-22 ENCOUNTER — Other Ambulatory Visit (INDEPENDENT_AMBULATORY_CARE_PROVIDER_SITE_OTHER): Payer: Self-pay | Admitting: *Deleted

## 2017-07-22 VITALS — BP 110/58 | HR 60 | Temp 97.4°F | Resp 18 | Ht 72.0 in | Wt 200.1 lb

## 2017-07-22 DIAGNOSIS — R1013 Epigastric pain: Secondary | ICD-10-CM

## 2017-07-22 DIAGNOSIS — R197 Diarrhea, unspecified: Secondary | ICD-10-CM

## 2017-07-22 DIAGNOSIS — R634 Abnormal weight loss: Secondary | ICD-10-CM

## 2017-07-22 DIAGNOSIS — K909 Intestinal malabsorption, unspecified: Secondary | ICD-10-CM

## 2017-07-22 DIAGNOSIS — R8290 Unspecified abnormal findings in urine: Secondary | ICD-10-CM

## 2017-07-22 MED ORDER — PANTOPRAZOLE SODIUM 40 MG PO TBEC: 40.0000 mg | DELAYED_RELEASE_TABLET | Freq: Every day | ORAL | 5 refills | Status: DC

## 2017-07-22 NOTE — Patient Instructions (Signed)
Physician will call with results of blood test when completed(chromogranin A). Keep stool diary as to frequency and consistency of stools. Keep food diary for 2 weeks as discussed. Drink nutritional supplement over a period of 1-2 hours rather than drinking all at once.

## 2017-07-22 NOTE — Progress Notes (Signed)
Presenting complaint;  Follow-up for diarrhea and weight loss.  Database and subjective:  Patient is 76 year old Caucasian male who was hospitalized at [facility] from 07/05/2017 to 19 2018 for diarrhea and dehydration.  He also reported over 30 pound weight loss over a period of 6 months.  Outpatient stool test was positive for C. difficile.  He was treated with vancomycin by Dr. [name] but he did not feel any better.  Stool testing for C. difficile in the hospital was negative.  GI pathogen panel similarly was unremarkable.  Acute on chronic kidney disease and responded to IV fluids.  He was treated symptomatically.  Stool frequency improved.  24-hour urine collection was sent for 5 HIAA along with 24-hour urine collection for fat, electrolytes and osmolality. These results were obtained after discharge and reviewed under lab data.  After stool test results were obtained patient was begun on Creon.  He has taken this medication for about 8 or 9 days and states it is helping some.  He is having an average of 3 stools per day.  He has gained 9 pounds since discharge.  Where he has gained edema to lower extremities.  Dr. [name] adjusted his diuretic therapy.  Complains of poor appetite but he does not have nausea or vomiting.  He has experienced epigastric pain after meals but is most pronounced after breakfast.  It may last for 30 minutes or so.  He denies melena or rectal bleeding.  He was in Prevacid but this apparently was discontinued at the time of discharge. Denies skin rash or difficulty breathing.  He states his stool order is not as pronounced.  He denies large volume diarrhea.    Current Medications: Outpatient Encounter Prescriptions as of 07/22/2017  Medication Sig  . acetaminophen (TYLENOL) 500 MG tablet Take 500 mg by mouth every 6 (six) hours as needed.  Marland Kitchen allopurinol (ZYLOPRIM) 100 MG tablet Take 200 mg by mouth daily.   Marland Kitchen apixaban (ELIQUIS) 5 MG TABS tablet Take 1 tablet (5 mg total) by  mouth 2 (two) times daily.  Marland Kitchen aspirin 81 MG tablet Take 81 mg by mouth daily.  . Cholecalciferol (VITAMIN D-3) 1000 units CAPS Take 1 capsule by mouth daily.  . feeding supplement, ENSURE ENLIVE, (ENSURE ENLIVE) LIQD Take 237 mLs by mouth 2 (two) times daily between meals.  . furosemide (LASIX) 40 MG tablet Take 1 tablet (40 mg total) by mouth daily as needed for fluid or edema (or more than 2-lbs of weight gain).  . insulin detemir (LEVEMIR) 100 UNIT/ML injection Inject 0.2 mLs (20 Units total) into the skin at bedtime.  . lipase/protease/amylase (CREON) 36000 UNITS CPEP capsule Take by mouth. Patient is taking 2 with each meal and 1 with each snack  . loperamide (IMODIUM) 2 MG capsule Take 1 capsule (2 mg total) by mouth 3 (three) times daily before meals.  Marland Kitchen LOSARTAN POTASSIUM PO Take 100 mg by mouth daily.  . Melatonin 10 MG CAPS Take 10 mg by mouth at bedtime.  Marland Kitchen OVER THE COUNTER MEDICATION OTC Sleepinal - patient takes 1 at bedtime.  . pravastatin (PRAVACHOL) 40 MG tablet Take 40 mg by mouth daily.  . Probiotic Product (PROBIOTIC DAILY PO) Take by mouth daily.  . Psyllium (EQ DAILY FIBER PO) Take by mouth. Per Patient's daughter states that he takes 1 a every morning.  . sodium bicarbonate 650 MG tablet Take 650 mg by mouth 3 (three) times daily.   . vitamin B-12 (CYANOCOBALAMIN) 1000 MCG tablet Take  1 tablet (1,000 mcg total) by mouth daily. Take 2 tabs by mouth daily.  . [DISCONTINUED] psyllium (HYDROCIL/METAMUCIL) 95 % PACK Take 1 packet by mouth daily. (Patient not taking: Reported on 07/22/2017)   Facility-Administered Encounter Medications as of 07/22/2017  Medication  . 0.9 %  sodium chloride infusion  . acetaminophen (TYLENOL) tablet 650 mg     Objective: Blood pressure (!) 110/58, pulse 60, temperature (!) 97.4 F (36.3 C), temperature source Oral, resp. rate 18, height 6' (1.829 m), weight 200 lb 1.6 oz (90.8 kg). Patient is alert and in no acute distress. Conjunctiva is  pink. Sclera is nonicteric Oropharyngeal mucosa is normal. No neck masses or thyromegaly noted. Cardiac exam with regular rhythm normal S1 and S2. No murmur or gallop noted. Lungs are clear to auscultation. Abdomen full.  Bowel sounds are normal.  It is mild midepigastric tenderness.  No organomegaly or masses noted. He has 2+ pitting edema to both legs.  Slightly more pronounced on the right side.  He also has skin changes of stasis but no skin break.  Labs/studies Results:  Lab data from 07/15/2017 H and H 9.5 and 31.0.  24-hour fecal weight was 872 g and fat 10.5 g.  24-hour urinary 5 HIAA 47.9 mg.  Assessment:  #1.  Diarrhea associated with weight loss most likely secondary to malabsorption.  Fecal fat analysis would favor this diagnosis.  However urinary 5 HIAA level is elevated.  It is not markedly elevated however.  I do not believe he has secretory. diarrhea.  Response to pancreatic enzyme supplement has been equivocal.  He will need further workup.  Will check chromogranin A level.  If it is normal will proceed with EGD with duodenal biopsy and colonoscopy.  #2.  Epigastric pain.  He was not having pain while he was hospitalized.  I wonder if this pain is because he is not a PPI anymore.  #3.  Anemia appears to be primarily due to chronic disease but he was felt to have iron deficiency and was going to receive iron infusion at oncology clinic but he has to be hospitalized and was postponed.    Plan:  Proceed with iron infusion as planned. Patient will go to the lab for chromogranin A level.. Continue pancreatic enzyme supplement for now.  Creon 72,000 units with each meal and 36K units with each snack for total of 8 capsules a day. Patient advised to increase physical activity as tolerated. Contact patient with results of blood test and go from there.

## 2017-07-23 ENCOUNTER — Ambulatory Visit (INDEPENDENT_AMBULATORY_CARE_PROVIDER_SITE_OTHER): Payer: PPO | Admitting: Internal Medicine

## 2017-07-23 NOTE — Progress Notes (Signed)
Patient was seen on 07/21/17 by Dr. Laural Golden.

## 2017-07-26 LAB — CHROMOGRANIN A: Chromogranin A: 128 nmol/L — ABNORMAL HIGH (ref 0–5)

## 2017-07-27 ENCOUNTER — Other Ambulatory Visit (INDEPENDENT_AMBULATORY_CARE_PROVIDER_SITE_OTHER): Payer: Self-pay | Admitting: Internal Medicine

## 2017-07-27 DIAGNOSIS — R634 Abnormal weight loss: Secondary | ICD-10-CM

## 2017-07-28 ENCOUNTER — Other Ambulatory Visit: Payer: Self-pay

## 2017-07-28 ENCOUNTER — Inpatient Hospital Stay (HOSPITAL_COMMUNITY)
Admission: EM | Admit: 2017-07-28 | Discharge: 2017-08-10 | DRG: 826 | Disposition: A | Discharge status: Home / Self Care | Payer: PPO | Attending: Family Medicine | Admitting: Family Medicine

## 2017-07-28 ENCOUNTER — Emergency Department (HOSPITAL_COMMUNITY): Payer: PPO

## 2017-07-28 ENCOUNTER — Telehealth (INDEPENDENT_AMBULATORY_CARE_PROVIDER_SITE_OTHER): Payer: Self-pay | Admitting: Internal Medicine

## 2017-07-28 ENCOUNTER — Other Ambulatory Visit (INDEPENDENT_AMBULATORY_CARE_PROVIDER_SITE_OTHER): Payer: Self-pay | Admitting: Internal Medicine

## 2017-07-28 ENCOUNTER — Encounter (HOSPITAL_COMMUNITY): Payer: Self-pay | Admitting: Emergency Medicine

## 2017-07-28 DIAGNOSIS — Z4682 Encounter for fitting and adjustment of non-vascular catheter: Secondary | ICD-10-CM | POA: Diagnosis not present

## 2017-07-28 DIAGNOSIS — I482 Chronic atrial fibrillation: Secondary | ICD-10-CM | POA: Diagnosis present

## 2017-07-28 DIAGNOSIS — C787 Secondary malignant neoplasm of liver and intrahepatic bile duct: Secondary | ICD-10-CM | POA: Diagnosis not present

## 2017-07-28 DIAGNOSIS — C7A098 Malignant carcinoid tumors of other sites: Principal | ICD-10-CM | POA: Diagnosis present

## 2017-07-28 DIAGNOSIS — K5669 Other partial intestinal obstruction: Secondary | ICD-10-CM | POA: Diagnosis not present

## 2017-07-28 DIAGNOSIS — K56699 Other intestinal obstruction unspecified as to partial versus complete obstruction: Secondary | ICD-10-CM | POA: Diagnosis not present

## 2017-07-28 DIAGNOSIS — E785 Hyperlipidemia, unspecified: Secondary | ICD-10-CM | POA: Diagnosis present

## 2017-07-28 DIAGNOSIS — E1122 Type 2 diabetes mellitus with diabetic chronic kidney disease: Secondary | ICD-10-CM | POA: Diagnosis present

## 2017-07-28 DIAGNOSIS — R7989 Other specified abnormal findings of blood chemistry: Secondary | ICD-10-CM

## 2017-07-28 DIAGNOSIS — R1111 Vomiting without nausea: Secondary | ICD-10-CM | POA: Diagnosis not present

## 2017-07-28 DIAGNOSIS — I25119 Atherosclerotic heart disease of native coronary artery with unspecified angina pectoris: Secondary | ICD-10-CM | POA: Diagnosis present

## 2017-07-28 DIAGNOSIS — Z79899 Other long term (current) drug therapy: Secondary | ICD-10-CM

## 2017-07-28 DIAGNOSIS — M109 Gout, unspecified: Secondary | ICD-10-CM | POA: Diagnosis present

## 2017-07-28 DIAGNOSIS — Z8673 Personal history of transient ischemic attack (TIA), and cerebral infarction without residual deficits: Secondary | ICD-10-CM

## 2017-07-28 DIAGNOSIS — N200 Calculus of kidney: Secondary | ICD-10-CM | POA: Diagnosis not present

## 2017-07-28 DIAGNOSIS — I130 Hypertensive heart and chronic kidney disease with heart failure and stage 1 through stage 4 chronic kidney disease, or unspecified chronic kidney disease: Secondary | ICD-10-CM | POA: Diagnosis present

## 2017-07-28 DIAGNOSIS — K315 Obstruction of duodenum: Secondary | ICD-10-CM | POA: Diagnosis present

## 2017-07-28 DIAGNOSIS — K311 Adult hypertrophic pyloric stenosis: Secondary | ICD-10-CM | POA: Diagnosis not present

## 2017-07-28 DIAGNOSIS — R82998 Other abnormal findings in urine: Secondary | ICD-10-CM

## 2017-07-28 DIAGNOSIS — Z87891 Personal history of nicotine dependence: Secondary | ICD-10-CM

## 2017-07-28 DIAGNOSIS — I6523 Occlusion and stenosis of bilateral carotid arteries: Secondary | ICD-10-CM | POA: Diagnosis not present

## 2017-07-28 DIAGNOSIS — R634 Abnormal weight loss: Secondary | ICD-10-CM

## 2017-07-28 DIAGNOSIS — Z01810 Encounter for preprocedural cardiovascular examination: Secondary | ICD-10-CM | POA: Diagnosis not present

## 2017-07-28 DIAGNOSIS — D484 Neoplasm of uncertain behavior of peritoneum: Secondary | ICD-10-CM | POA: Diagnosis not present

## 2017-07-28 DIAGNOSIS — I480 Paroxysmal atrial fibrillation: Secondary | ICD-10-CM | POA: Diagnosis not present

## 2017-07-28 DIAGNOSIS — I340 Nonrheumatic mitral (valve) insufficiency: Secondary | ICD-10-CM | POA: Diagnosis not present

## 2017-07-28 DIAGNOSIS — E876 Hypokalemia: Secondary | ICD-10-CM | POA: Diagnosis not present

## 2017-07-28 DIAGNOSIS — K565 Intestinal adhesions [bands], unspecified as to partial versus complete obstruction: Secondary | ICD-10-CM | POA: Diagnosis not present

## 2017-07-28 DIAGNOSIS — N184 Chronic kidney disease, stage 4 (severe): Secondary | ICD-10-CM | POA: Diagnosis not present

## 2017-07-28 DIAGNOSIS — J9811 Atelectasis: Secondary | ICD-10-CM | POA: Diagnosis not present

## 2017-07-28 DIAGNOSIS — K56609 Unspecified intestinal obstruction, unspecified as to partial versus complete obstruction: Secondary | ICD-10-CM | POA: Diagnosis present

## 2017-07-28 DIAGNOSIS — Z4659 Encounter for fitting and adjustment of other gastrointestinal appliance and device: Secondary | ICD-10-CM

## 2017-07-28 DIAGNOSIS — D649 Anemia, unspecified: Secondary | ICD-10-CM | POA: Diagnosis not present

## 2017-07-28 DIAGNOSIS — C7A8 Other malignant neuroendocrine tumors: Secondary | ICD-10-CM | POA: Diagnosis not present

## 2017-07-28 DIAGNOSIS — Z955 Presence of coronary angioplasty implant and graft: Secondary | ICD-10-CM

## 2017-07-28 DIAGNOSIS — I10 Essential (primary) hypertension: Secondary | ICD-10-CM | POA: Diagnosis not present

## 2017-07-28 DIAGNOSIS — N289 Disorder of kidney and ureter, unspecified: Secondary | ICD-10-CM | POA: Diagnosis not present

## 2017-07-28 DIAGNOSIS — I2510 Atherosclerotic heart disease of native coronary artery without angina pectoris: Secondary | ICD-10-CM | POA: Diagnosis present

## 2017-07-28 DIAGNOSIS — K668 Other specified disorders of peritoneum: Secondary | ICD-10-CM | POA: Diagnosis present

## 2017-07-28 DIAGNOSIS — N189 Chronic kidney disease, unspecified: Secondary | ICD-10-CM

## 2017-07-28 DIAGNOSIS — N170 Acute kidney failure with tubular necrosis: Secondary | ICD-10-CM | POA: Diagnosis not present

## 2017-07-28 DIAGNOSIS — Z794 Long term (current) use of insulin: Secondary | ICD-10-CM

## 2017-07-28 DIAGNOSIS — I5032 Chronic diastolic (congestive) heart failure: Secondary | ICD-10-CM | POA: Diagnosis not present

## 2017-07-28 DIAGNOSIS — Z7901 Long term (current) use of anticoagulants: Secondary | ICD-10-CM

## 2017-07-28 DIAGNOSIS — M7041 Prepatellar bursitis, right knee: Secondary | ICD-10-CM | POA: Diagnosis present

## 2017-07-28 DIAGNOSIS — M25561 Pain in right knee: Secondary | ICD-10-CM

## 2017-07-28 DIAGNOSIS — E1129 Type 2 diabetes mellitus with other diabetic kidney complication: Secondary | ICD-10-CM | POA: Diagnosis not present

## 2017-07-28 DIAGNOSIS — R000 Tachycardia, unspecified: Secondary | ICD-10-CM

## 2017-07-28 DIAGNOSIS — N179 Acute kidney failure, unspecified: Secondary | ICD-10-CM | POA: Diagnosis not present

## 2017-07-28 DIAGNOSIS — Z980 Intestinal bypass and anastomosis status: Secondary | ICD-10-CM

## 2017-07-28 DIAGNOSIS — N183 Chronic kidney disease, stage 3 unspecified: Secondary | ICD-10-CM

## 2017-07-28 DIAGNOSIS — I491 Atrial premature depolarization: Secondary | ICD-10-CM | POA: Diagnosis present

## 2017-07-28 DIAGNOSIS — Z66 Do not resuscitate: Secondary | ICD-10-CM | POA: Diagnosis present

## 2017-07-28 DIAGNOSIS — Z7982 Long term (current) use of aspirin: Secondary | ICD-10-CM

## 2017-07-28 DIAGNOSIS — E1151 Type 2 diabetes mellitus with diabetic peripheral angiopathy without gangrene: Secondary | ICD-10-CM | POA: Diagnosis present

## 2017-07-28 DIAGNOSIS — M179 Osteoarthritis of knee, unspecified: Secondary | ICD-10-CM | POA: Diagnosis not present

## 2017-07-28 DIAGNOSIS — K56691 Other complete intestinal obstruction: Secondary | ICD-10-CM | POA: Diagnosis not present

## 2017-07-28 DIAGNOSIS — K559 Vascular disorder of intestine, unspecified: Secondary | ICD-10-CM | POA: Diagnosis present

## 2017-07-28 DIAGNOSIS — R112 Nausea with vomiting, unspecified: Secondary | ICD-10-CM | POA: Diagnosis present

## 2017-07-28 DIAGNOSIS — E538 Deficiency of other specified B group vitamins: Secondary | ICD-10-CM | POA: Diagnosis present

## 2017-07-28 DIAGNOSIS — E860 Dehydration: Secondary | ICD-10-CM

## 2017-07-28 DIAGNOSIS — I959 Hypotension, unspecified: Secondary | ICD-10-CM | POA: Diagnosis not present

## 2017-07-28 DIAGNOSIS — M5136 Other intervertebral disc degeneration, lumbar region: Secondary | ICD-10-CM | POA: Diagnosis present

## 2017-07-28 DIAGNOSIS — E43 Unspecified severe protein-calorie malnutrition: Secondary | ICD-10-CM | POA: Diagnosis present

## 2017-07-28 DIAGNOSIS — E875 Hyperkalemia: Secondary | ICD-10-CM | POA: Diagnosis present

## 2017-07-28 DIAGNOSIS — K567 Ileus, unspecified: Secondary | ICD-10-CM

## 2017-07-28 DIAGNOSIS — I700 Atherosclerosis of aorta: Secondary | ICD-10-CM | POA: Diagnosis present

## 2017-07-28 DIAGNOSIS — Z6825 Body mass index (BMI) 25.0-25.9, adult: Secondary | ICD-10-CM

## 2017-07-28 DIAGNOSIS — C7B8 Other secondary neuroendocrine tumors: Secondary | ICD-10-CM | POA: Diagnosis not present

## 2017-07-28 DIAGNOSIS — R197 Diarrhea, unspecified: Secondary | ICD-10-CM

## 2017-07-28 DIAGNOSIS — E782 Mixed hyperlipidemia: Secondary | ICD-10-CM | POA: Diagnosis not present

## 2017-07-28 DIAGNOSIS — D631 Anemia in chronic kidney disease: Secondary | ICD-10-CM | POA: Diagnosis present

## 2017-07-28 HISTORY — DX: Essential (primary) hypertension: I10

## 2017-07-28 LAB — COMPREHENSIVE METABOLIC PANEL
ALK PHOS: 88 U/L (ref 38–126)
ALT: 12 U/L — AB (ref 17–63)
AST: 18 U/L (ref 15–41)
Albumin: 3.7 g/dL (ref 3.5–5.0)
Anion gap: 12 (ref 5–15)
BUN: 44 mg/dL — AB (ref 6–20)
CALCIUM: 9.1 mg/dL (ref 8.9–10.3)
CHLORIDE: 106 mmol/L (ref 101–111)
CO2: 20 mmol/L — ABNORMAL LOW (ref 22–32)
CREATININE: 2.53 mg/dL — AB (ref 0.61–1.24)
GFR calc Af Amer: 27 mL/min — ABNORMAL LOW (ref >60)
GFR, EST NON AFRICAN AMERICAN: 23 mL/min — AB (ref >60)
Glucose, Bld: 153 mg/dL — ABNORMAL HIGH (ref 65–99)
Potassium: 5.5 mmol/L — ABNORMAL HIGH (ref 3.5–5.1)
Sodium: 138 mmol/L (ref 135–145)
Total Bilirubin: 0.5 mg/dL (ref 0.3–1.2)
Total Protein: 8.2 g/dL — ABNORMAL HIGH (ref 6.5–8.1)

## 2017-07-28 LAB — CBC WITH DIFFERENTIAL/PLATELET
BASOS ABS: 0 10*3/uL (ref 0.0–0.1)
Basophils Relative: 0 %
Eosinophils Absolute: 0 10*3/uL (ref 0.0–0.7)
Eosinophils Relative: 0 %
HEMATOCRIT: 36.3 % — AB (ref 39.0–52.0)
HEMOGLOBIN: 10.8 g/dL — AB (ref 13.0–17.0)
LYMPHS PCT: 14 %
Lymphs Abs: 1.1 10*3/uL (ref 0.7–4.0)
MCH: 29.5 pg (ref 26.0–34.0)
MCHC: 29.8 g/dL — ABNORMAL LOW (ref 30.0–36.0)
MCV: 99.2 fL (ref 78.0–100.0)
Monocytes Absolute: 0.4 10*3/uL (ref 0.1–1.0)
Monocytes Relative: 5 %
NEUTROS ABS: 6.9 10*3/uL (ref 1.7–7.7)
Neutrophils Relative %: 81 %
Platelets: 234 10*3/uL (ref 150–400)
RBC: 3.66 MIL/uL — AB (ref 4.22–5.81)
RDW: 17.9 % — ABNORMAL HIGH (ref 11.5–15.5)
WBC: 8.4 10*3/uL (ref 4.0–10.5)

## 2017-07-28 LAB — URINALYSIS, ROUTINE W REFLEX MICROSCOPIC
Bilirubin Urine: NEGATIVE
Glucose, UA: NEGATIVE mg/dL
Ketones, ur: NEGATIVE mg/dL
Nitrite: NEGATIVE
PROTEIN: NEGATIVE mg/dL
SPECIFIC GRAVITY, URINE: 1.017 (ref 1.005–1.030)
pH: 5 (ref 5.0–8.0)

## 2017-07-28 LAB — LIPASE, BLOOD: Lipase: 26 U/L (ref 11–51)

## 2017-07-28 LAB — CBG MONITORING, ED: Glucose-Capillary: 144 mg/dL — ABNORMAL HIGH (ref 65–99)

## 2017-07-28 MED ORDER — INSULIN ASPART 100 UNIT/ML ~~LOC~~ SOLN
0.0000 [IU] | SUBCUTANEOUS | Status: DC
  Administered 2017-07-29 (×2): 3 [IU] via SUBCUTANEOUS

## 2017-07-28 MED ORDER — HYDRALAZINE HCL 20 MG/ML IJ SOLN
10.0000 mg | Freq: Three times a day (TID) | INTRAMUSCULAR | Status: DC | PRN
  Administered 2017-07-29: 10 mg via INTRAVENOUS
  Filled 2017-07-28: qty 1

## 2017-07-28 MED ORDER — ONDANSETRON HCL 4 MG/2ML IJ SOLN
4.0000 mg | Freq: Once | INTRAMUSCULAR | Status: AC | PRN
  Administered 2017-07-28: 4 mg via INTRAVENOUS

## 2017-07-28 MED ORDER — ACETAMINOPHEN 650 MG RE SUPP: 650.0000 mg | Freq: Four times a day (QID) | RECTAL | Status: DC | PRN

## 2017-07-28 MED ORDER — ONDANSETRON HCL 4 MG/2ML IJ SOLN
4.0000 mg | Freq: Four times a day (QID) | INTRAMUSCULAR | Status: DC | PRN
  Administered 2017-08-02: 4 mg via INTRAVENOUS

## 2017-07-28 MED ORDER — SODIUM CHLORIDE 0.9 % IV BOLUS (SEPSIS)
1000.0000 mL | Freq: Once | INTRAVENOUS | Status: AC
  Administered 2017-07-28: 1000 mL via INTRAVENOUS

## 2017-07-28 MED ORDER — ONDANSETRON HCL 4 MG PO TABS: 4.0000 mg | ORAL_TABLET | Freq: Four times a day (QID) | ORAL | Status: DC | PRN

## 2017-07-28 MED ORDER — SODIUM CHLORIDE 0.9% FLUSH
3.0000 mL | Freq: Two times a day (BID) | INTRAVENOUS | Status: DC
  Administered 2017-07-29 – 2017-08-10 (×21): 3 mL via INTRAVENOUS

## 2017-07-28 MED ORDER — ACETAMINOPHEN 325 MG PO TABS
650.0000 mg | ORAL_TABLET | Freq: Four times a day (QID) | ORAL | Status: DC | PRN
  Administered 2017-08-03 – 2017-08-04 (×2): 650 mg via ORAL
  Filled 2017-07-28 (×2): qty 2

## 2017-07-28 MED ORDER — SODIUM CHLORIDE 0.9 % IV SOLN
INTRAVENOUS | Status: DC
  Administered 2017-07-29 (×2): via INTRAVENOUS
  Administered 2017-07-30: 100 mL/h via INTRAVENOUS
  Administered 2017-08-02 – 2017-08-05 (×9): via INTRAVENOUS

## 2017-07-28 MED ORDER — ONDANSETRON HCL 4 MG/2ML IJ SOLN
INTRAMUSCULAR | Status: AC
  Administered 2017-07-28: 4 mg via INTRAVENOUS
  Filled 2017-07-28: qty 2

## 2017-07-28 NOTE — ED Provider Notes (Addendum)
Emergency Department Provider Note   I have reviewed the triage vital signs and the nursing notes.   HISTORY  Chief Complaint Emesis   HPI Stephen [name] is a 76 y.o. male with multiple medical problems as documented below presents to the emergency department today with emesis.  Patient states that he was recently admitted to the hospital and review of records it seems like it was related to an acute kidney injury secondary to diarrhea.  They are unsure of the cause of diarrhea currently but is being worked up for it by his gastroenterologist.  He says the diarrhea stopped this morning and the vomiting started shortly afterwards had multiple episodes of greenish yellow vomit has been mostly water.  States that sometimes smells like feces and looks like feces.  No history of this.  Some abdominal pain that is mild and diffuse but no significant distention.  No fevers.  No sick contacts.  Has not passed gas today.  No urinary symptoms such as dysuria, polyuria, malodorous urine.  No rashes.  Has had an appendectomy in the past.  We will schedule get a CT of his abdomen and pelvis done this Friday from his gastroenterologist.  No other associated modifying symptoms.  No fevers, chest pain, back pain, shortness of breath or other symptoms.   Past Medical History:  Diagnosis Date  . Anemia 06/01/2012   H&H of 10/30 in 8/13 with normal MCV   . Anemia of chronic renal failure, stage 3 (moderate) (HCC) 06/01/2012   H&H of 10/30 in 8/13 with normal MCV  . Atrial fibrillation ([place])    Documented 08/2016  . B12 deficiency 08/17/2016  . Carotid artery occlusion    Right carotid endarterectomy 2001; left carotid endarterectomy in 2008  . Celiac disease   . Chronic renal disease, stage 3, moderately decreased glomerular filtration rate between 30-59 mL/min/1.73 square meter ([place]) 06/01/2012  . Coronary atherosclerosis of native coronary artery    PTCA of LAD in 1994; total obstruction of the RCA  treated medically; subsequent stress nuclear study with inferior ischemia  . DDD (degenerative disc disease), lumbar    Lumbar surgery in 1984  . Diabetes mellitus type II   . Essential hypertension, benign   . History of stroke   . Hyperlipidemia   . Iron deficiency anemia 12/31/2016  . Peripheral vascular disease ([place])   . Stroke [facility])    Right brain 2001; right PCA, MCA/PCA, left SCA infarcts 08/2017 with atrial fibrillation    Patient Active Problem List   Diagnosis Date Noted  . Intractable nausea and vomiting 07/28/2017  . Protein-calorie malnutrition, severe 07/06/2017  . Enteropathogenic Escherichia coli infection 07/06/2017  . Abnormal weight loss 07/06/2017  . Acute renal failure superimposed on stage 4 chronic kidney disease ([place]) 07/05/2017  . Permanent atrial fibrillation ([place]) 07/05/2017  . Diarrhea 07/05/2017  . Iron deficiency anemia 12/31/2016  . Paroxysmal atrial fibrillation ([place]) 12/14/2016  . Stroke (cerebrum) ([place]) 09/04/2016  . B12 deficiency 08/17/2016  . PAD (peripheral artery disease) ([place]) 01/21/2016  . Irregular heartbeat 07/19/2014  . Atherosclerotic PVD with intermittent claudication ([place]) 07/17/2014  . Carotid stenosis 07/12/2014  . Chronic diastolic heart failure ([place]) 09/27/2013  . Occlusion and stenosis of carotid artery without mention of cerebral infarction 06/28/2012  . Anemia of chronic renal failure, stage 3 (moderate) ([place]) 06/01/2012  . Chronic renal disease, stage 3, moderately decreased glomerular filtration rate between 30-59 mL/min/1.73 square meter (HCC) 06/01/2012  . Diabetes mellitus type II   .  Essential hypertension, benign   . Hyperlipidemia   . Cerebrovascular disease   . Coronary atherosclerosis of native coronary artery   . Peripheral vascular disease [facility])     Past Surgical History:  Procedure Laterality Date  . APPENDECTOMY  1970  . CAROTID ENDARTERECTOMY  2008   Left   . CAROTID ENDARTERECTOMY  06/15/2000   Right    . [place] SURGERY  2000, 2001   L5 HNP required 2 surgical procedures    Current Outpatient Rx  . Order #: [id number] Class: Historical Med  . Order #: [id number] Class: Historical Med  . Order #: [id number] Class: Print  . Order #: [id number] Class: Historical Med  . Order #: [id number] Class: Historical Med  . Order #: [id number] Class: Normal  . Order #: [id number] Class: Normal  . Order #: [id number] Class: Normal  . Order #: [id number] Class: Normal  . Order #: [id number] Class: Historical Med  . Order #: [id number] Class: Historical Med  . Order #: [id number] Class: Historical Med  . Order #: [id number] Class: Normal  . Order #: [id number] Class: Historical Med  . Order #: [id number] Class: Historical Med  . Order #: [id number] Class: Historical Med  . Order #: [id number] Class: Historical Med  . Order #: [id number] Class: Historical Med  . Order #: [id number] Class: Normal    Allergies Patient has no known allergies.  Family History  Problem Relation Age of Onset  . Heart disease Mother        Before age 59  . Diabetes Mother   . Heart attack Mother   . Diabetes Sister   . Heart disease Brother   . Diabetes Brother   . Diabetes Daughter   . Diabetes Son   . Hyperlipidemia Son     Social History Social History   Tobacco Use  . Smoking status: Former Smoker    Packs/day: 3.00    Years: 10.00    Pack years: 30.00    Types: Cigarettes    Last attempt to quit: 09/21/1972    Years since quitting: 44.8  . Smokeless tobacco: Never Used  Substance Use Topics  . Alcohol use: No    Alcohol/week: 0.0 oz  . Drug use: No    Review of Systems  All other systems negative except as documented in the HPI. All pertinent positives and negatives as reviewed in the HPI. ____________________________________________   PHYSICAL EXAM:  VITAL SIGNS: ED Triage Vitals [07/28/17 1507]  Enc Vitals Group     BP 125/61     Pulse Rate (!) 116     Resp 20     Temp 98 F (36.7 C)     Temp Source Oral      SpO2 99 %    Constitutional: Alert and oriented. Well appearing and in no acute distress. Eyes: Conjunctivae are normal. PERRL. EOMI. Head: Atraumatic. Nose: No congestion/rhinnorhea. Mouth/Throat: Mucous membranes are moist.  Oropharynx non-erythematous. Neck: No stridor.  No meningeal signs.   Cardiovascular: tachycardic rate, irregular rhythm. Good peripheral circulation. Grossly normal heart sounds.   Respiratory: Normal respiratory effort.  No retractions. Lungs CTAB. Gastrointestinal: Soft and diffusely tender. No distention.  Musculoskeletal: No lower extremity tenderness nor edema. No gross deformities of extremities. Neurologic:  Normal speech and language. No gross focal neurologic deficits are appreciated.  Skin:  Skin is warm, dry and intact. No rash noted.  ____________________________________________   LABS (all labs ordered are listed, but only abnormal results are displayed)  Labs Reviewed  COMPREHENSIVE METABOLIC PANEL - Abnormal; Notable for the following components:  Result Value   Potassium 5.5 (*)    CO2 20 (*)    Glucose, Bld 153 (*)    BUN 44 (*)    Creatinine, Ser 2.53 (*)    Total Protein 8.2 (*)    ALT 12 (*)    GFR calc non Af Amer 23 (*)    GFR calc Af Amer 27 (*)    All other components within normal limits  URINALYSIS, ROUTINE W REFLEX MICROSCOPIC - Abnormal; Notable for the following components:   APPearance HAZY (*)    Hgb urine dipstick MODERATE (*)    Leukocytes, UA SMALL (*)    Bacteria, UA RARE (*)    Squamous Epithelial / LPF 0-5 (*)    All other components within normal limits  CBC WITH DIFFERENTIAL/PLATELET - Abnormal; Notable for the following components:   RBC 3.66 (*)    Hemoglobin 10.8 (*)    HCT 36.3 (*)    MCHC 29.8 (*)    RDW 17.9 (*)    All other components within normal limits  CBG MONITORING, ED - Abnormal; Notable for the following components:   Glucose-Capillary 144 (*)    All other components within normal  limits  LIPASE, BLOOD   ____________________________________________  RADIOLOGY  Ct Abdomen Pelvis Wo Contrast  Result Date: 07/28/2017 CLINICAL DATA:  Diarrhea for months. Sudden severe nausea and vomiting all day today. Abdominal pain. EXAM: CT ABDOMEN AND PELVIS WITHOUT CONTRAST TECHNIQUE: Multidetector CT imaging of the abdomen and pelvis was performed following the standard protocol without IV contrast. COMPARISON:  11/18/2006 FINDINGS: Lower chest: 4 mm subpleural nodule in the right lung base. 4 mm subpleural nodule in the left lung base. These nodules were not seen previously. Several additional smaller nodules are demonstrated. Hepatobiliary: No focal liver lesions are identified. The gallbladder is distended but there is no wall thickening or inflammatory change. No radiopaque stones are demonstrated. Pancreas: Unremarkable. No pancreatic ductal dilatation or surrounding inflammatory changes. Spleen: Normal in size without focal abnormality. Adrenals/Urinary Tract: No adrenal gland nodules. Bilateral intrarenal stones. Largest is in the left lower pole measuring 12 mm in diameter. No hydronephrosis or hydroureter. No ureteral stones are demonstrated. Bladder wall is not thickened. No bladder filling defects. Stomach/Bowel: The stomach is markedly distended, mostly with fluid. No gastric wall thickening is identified. The proximal duodenum is distended. Transition zone appears to be at the second portion of the duodenum as it crosses beneath the superior mesenteric artery. Remainder of the small bowel is decompressed. Scattered stool in the colon. No colonic distention or wall thickening. Appendix is not identified. Vascular/Lymphatic: Calcification of the abdominal aorta. No aneurysm. There is retroperitoneal and periaortic lymphadenopathy with lymph nodes measuring up to about 15 mm diameter. Some of the lymph nodes are calcified. Prominent celiac axis and mesenteric lymphadenopathy. There is a  partially calcified mass centrally in the mesentery and surrounding the superior mesenteric artery. The mass measures about 3.3 x 5.5 cm in diameter. Desmoplastic response with radiating scars into the mesentery. Reproductive: Prostate gland is not enlarged. Prostate calcifications are present. Other: No free air in the abdomen. Small amount of free fluid around the liver edge. Abdominal wall musculature appears intact. Musculoskeletal: Degenerative changes in the spine. No destructive bone lesions. IMPRESSION: 1. Large partially calcified mass in the root of the mesentery with radiating scarring. Additional calcified and noncalcified lymphadenopathy in the retroperitoneum and celiac axis. Differential diagnosis would include carcinoid tumor, sclerosing mesenteritis, or possibly lymphoma. 2. Marked distention of the  stomach and proximal duodenum with transition zone at the second- third portion of the duodenum. Obstruction is likely caused by the mesenteric process described above. 3. Distended gallbladder without evidence of cholelithiasis. 4. Multiple bilateral nonobstructing intrarenal stones. 5. Small amount of free fluid around the liver edge. 6. Aortic calcifications. 7. Small scattered nodules in the lung bases, largest measuring 4 mm. No follow-up needed if patient is low-risk (and has no known or suspected primary neoplasm). Non-contrast chest CT can be considered in 12 months if patient is high-risk. This recommendation follows the consensus statement: Guidelines for Management of Incidental Pulmonary Nodules Detected on CT Images: From the Fleischner Society 2017; Radiology 2017; 284:228-243. Electronically Signed   By: Lucienne Capers M.D.   On: 07/28/2017 21:27    ____________________________________________   PROCEDURES  Procedure(s) performed:   Procedures   ____________________________________________   INITIAL IMPRESSION / ASSESSMENT AND PLAN / ED COURSE  Pertinent labs & imaging  results that were available during my care of the patient were reviewed by me and considered in my medical decision making (see chart for details).  Patient CT scan shows likely small bowel obstruction secondary to mass at the base of the superior mesenteric artery.  Discussed with surgery, Dr. Constance Haw, who states that this is likely inoperable if is truly at the base of the mesentery artery.  She says she was to look at the scans and see the patient in the morning that there may be other procedural options to divert for the obstruction.  Discussed case with Dr. Aggie Moats who will admit.  NG tube ordered to try to help with the symptoms.   ____________________________________________  FINAL CLINICAL IMPRESSION(S) / ED DIAGNOSES  Final diagnoses:  Other complete intestinal obstruction (HCC)  Dehydration  AKI (acute kidney injury) ([place])     MEDICATIONS GIVEN DURING THIS VISIT:  Medications  sodium chloride 0.9 % bolus 1,000 mL (not administered)  ondansetron (ZOFRAN) injection 4 mg (4 mg Intravenous Given 07/28/17 1711)  sodium chloride 0.9 % bolus 1,000 mL (0 mLs Intravenous Stopped 07/28/17 2138)     NEW OUTPATIENT MEDICATIONS STARTED DURING THIS VISIT:  This SmartLink is deprecated. Use AVSMEDLIST instead to display the medication list for a patient.  Note:  This document was prepared using Dragon voice recognition software and may include unintentional dictation errors.   Merrily Pew, MD 07/28/17 3810    Merrily Pew, MD 07/28/17 [phone]

## 2017-07-28 NOTE — Telephone Encounter (Signed)
Talked with Dr.Rehman. He feels that the patient should go to the ED for evaluation,with his medical history and recent test results. Both the patient and his daughter were called and made aware. Stephen [name] states that his friend,Jane will take him.

## 2017-07-28 NOTE — Telephone Encounter (Signed)
Patient's daughter Stephen [name] called and stated that he has been throwing up since 8am this morning, stated that he has thrown up about eight times.  No diarrhea.  She wants to know what they need to do.  [phone] Patient [phone] Stephen [name]

## 2017-07-28 NOTE — H&P (Signed)
[facility] History and Physical  Stephen [name] XHB:[id number] DOB: 1941-07-30 DOA: 07/28/2017  Referring physician:  PCP: [name], MD   Chief Complaint: "I just started throwing up this morning."  HPI: Stephen [name] is a 76 y.o. male with past medical history significant for atrial fibrillation, stroke, CKD 3 and anemia presents the emergency room with chief complaint of nausea vomiting.  Patient states he has had several weeks of diarrhea and recently had an admission for workup and hydration.  Patient has had continued symptoms as an outpatient.  Had brief 1 day reprieve from diarrhea and the next day started to have significant nausea and vomiting.  Due to  multiple episodes of nausea vomiting patient came to the emergency room for evaluation.  ED course: Patient given IV fluids for hydration.  CT of the abdomen and pelvis showed abdominal mass around the root of the mesentery.  EDP spoke to general surgeon on call who will eval patient in the morning for possible procedure.  Hospitalist consulted for admission.  Review of Systems:  As per HPI otherwise 10 point review of systems negative.    Past Medical History:  Diagnosis Date  . Anemia 06/01/2012   H&H of 10/30 in 8/13 with normal MCV   . Anemia of chronic renal failure, stage 3 (moderate) (HCC) 06/01/2012   H&H of 10/30 in 8/13 with normal MCV  . Atrial fibrillation ([place])    Documented 08/2016  . B12 deficiency 08/17/2016  . Carotid artery occlusion    Right carotid endarterectomy 2001; left carotid endarterectomy in 2008  . Celiac disease   . Chronic renal disease, stage 3, moderately decreased glomerular filtration rate between 30-59 mL/min/1.73 square meter ([place]) 06/01/2012  . Coronary atherosclerosis of native coronary artery    PTCA of LAD in 1994; total obstruction of the RCA treated medically; subsequent stress nuclear study with inferior ischemia  . DDD (degenerative disc disease), lumbar    Lumbar surgery  in 1984  . Diabetes mellitus type II   . Essential hypertension, benign   . History of stroke   . Hyperlipidemia   . Iron deficiency anemia 12/31/2016  . Peripheral vascular disease ([place])   . Stroke [facility])    Right brain 2001; right PCA, MCA/PCA, left SCA infarcts 08/2017 with atrial fibrillation   Past Surgical History:  Procedure Laterality Date  . APPENDECTOMY  1970  . CAROTID ENDARTERECTOMY  2008   Left   . CAROTID ENDARTERECTOMY  06/15/2000   Right  . [place] SURGERY  2000, 2001   L5 HNP required 2 surgical procedures   Social History:  reports that he quit smoking about 44 years ago. His smoking use included cigarettes. He has a 30.00 pack-year smoking history. he has never used smokeless tobacco. He reports that he does not drink alcohol or use drugs.  No Known Allergies  Family History  Problem Relation Age of Onset  . Heart disease Mother        Before age 59  . Diabetes Mother   . Heart attack Mother   . Diabetes Sister   . Heart disease Brother   . Diabetes Brother   . Diabetes Daughter   . Diabetes Son   . Hyperlipidemia Son      Prior to Admission medications   Medication Sig Start Date End Date Taking? Authorizing Provider  acetaminophen (TYLENOL) 500 MG tablet Take 500 mg by mouth every 6 (six) hours as needed.   Yes [provider]  allopurinol (ZYLOPRIM) 100 MG tablet Take 200 mg by mouth daily.  12/16/16  Yes [provider]  apixaban (ELIQUIS) 5 MG TABS tablet Take 1 tablet (5 mg total) by mouth 2 (two) times daily. 04/26/17  Yes Rosalin Hawking, MD  aspirin 81 MG tablet Take 81 mg by mouth daily.   Yes [provider]  Cholecalciferol (VITAMIN D-3) 1000 units CAPS Take 1 capsule by mouth daily.   Yes [provider]  feeding supplement, ENSURE ENLIVE, (ENSURE ENLIVE) LIQD Take 237 mLs by mouth 2 (two) times daily between meals. 07/09/17  Yes Short, Noah Delaine, MD  furosemide (LASIX) 40 MG tablet Take 1 tablet (40 mg  total) by mouth daily as needed for fluid or edema (or more than 2-lbs of weight gain). Patient taking differently: Take 60 mg daily by mouth.  07/09/17  Yes Short, Noah Delaine, MD  insulin detemir (LEVEMIR) 100 UNIT/ML injection Inject 0.2 mLs (20 Units total) into the skin at bedtime. 07/09/17  Yes Short, Noah Delaine, MD  loperamide (IMODIUM) 2 MG capsule Take 1 capsule (2 mg total) by mouth 3 (three) times daily before meals. 07/09/17  Yes Short, Noah Delaine, MD  losartan (COZAAR) 100 MG tablet Take 100 mg by mouth daily.   Yes [provider]  Melatonin 10 MG CAPS Take 10 mg by mouth at bedtime.   Yes [provider]  OVER THE COUNTER MEDICATION OTC Sleepinal - patient takes 1 at bedtime.   Yes [provider]  pantoprazole (PROTONIX) 40 MG tablet Take 1 tablet (40 mg total) by mouth daily before breakfast. 07/22/17  Yes Rehman, Mechele Dawley, MD  pravastatin (PRAVACHOL) 40 MG tablet Take 40 mg by mouth daily.   Yes [provider]  Probiotic Product (PROBIOTIC DAILY PO) Take 1 tablet daily by mouth.    Yes [provider]  prochlorperazine (COMPAZINE) 10 MG tablet Take 10 mg every 6 (six) hours as needed by mouth for nausea or vomiting.  07/28/17  Yes [provider]  Psyllium (EQ DAILY FIBER PO) Take 1 capsule every morning by mouth. Per Patient's daughter states that he takes 1 a every morning.    Yes [provider]  sodium bicarbonate 650 MG tablet Take 650 mg by mouth 3 (three) times daily.    Yes [provider]  vitamin B-12 (CYANOCOBALAMIN) 1000 MCG tablet Take 1 tablet (1,000 mcg total) by mouth daily. Take 2 tabs by mouth daily. 07/09/17  Carolan Clines, MD   Physical Exam: Vitals:   07/28/17 1708 07/28/17 1731 07/28/17 1942 07/28/17 2130  BP: 116/64 (!) 132/51 (!) 165/85 137/74  Pulse: (!) 103  (!) 40 (!) 122  Resp: 18 (!) 26 (!) 21 (!) 27  Temp:   99 F (37.2 C)   TempSrc:      SpO2: 98% 98% 98% 98%    Wt  Readings from Last 3 Encounters:  07/22/17 90.8 kg (200 lb 1.6 oz)  07/05/17 85.9 kg (189 lb 6.4 oz)  06/17/17 91.6 kg (202 lb)    General:  Appears calm and comfortable; A&Ox3 Eyes:  PERRL, EOMI, normal lids, iris ENT:  grossly normal hearing, lips & tongue Neck:  no LAD, masses or thyromegaly Cardiovascular:  RRR, no m/r/g. No LE edema.  Respiratory:  CTA bilaterally, no w/r/r. Normal respiratory effort. Abdomen:  soft, ND, LLQ tenderness Skin:  no rash or induration seen on limited exam Musculoskeletal:  grossly normal tone BUE/BLE Psychiatric:  grossly normal mood and affect, speech fluent and  appropriate Neurologic:  CN 2-12 grossly intact, moves all extremities in coordinated fashion.          Labs on Admission:  Basic Metabolic Panel: Recent Labs  Lab 07/28/17 1658  NA 138  K 5.5*  CL 106  CO2 20*  GLUCOSE 153*  BUN 44*  CREATININE 2.53*  CALCIUM 9.1   Liver Function Tests: Recent Labs  Lab 07/28/17 1658  AST 18  ALT 12*  ALKPHOS 88  BILITOT 0.5  PROT 8.2*  ALBUMIN 3.7   Recent Labs  Lab 07/28/17 1739  LIPASE 26   No results for input(s): AMMONIA in the last 168 hours. CBC: Recent Labs  Lab 07/28/17 1658  WBC 8.4  NEUTROABS 6.9  HGB 10.8*  HCT 36.3*  MCV 99.2  PLT 234   Cardiac Enzymes: No results for input(s): CKTOTAL, CKMB, CKMBINDEX, TROPONINI in the last 168 hours.  BNP (last 3 results) No results for input(s): BNP in the last 8760 hours.  ProBNP (last 3 results) No results for input(s): PROBNP in the last 8760 hours.   Serum creatinine: 2.53 mg/dL (H) 07/28/17 1658 Estimated creatinine clearance: 27.3 mL/min (A)  CBG: Recent Labs  Lab 07/28/17 1513  GLUCAP 144*    Radiological Exams on Admission: Ct Abdomen Pelvis Wo Contrast  Result Date: 07/28/2017 CLINICAL DATA:  Diarrhea for months. Sudden severe nausea and vomiting all day today. Abdominal pain. EXAM: CT ABDOMEN AND PELVIS WITHOUT CONTRAST TECHNIQUE: Multidetector  CT imaging of the abdomen and pelvis was performed following the standard protocol without IV contrast. COMPARISON:  11/18/2006 FINDINGS: Lower chest: 4 mm subpleural nodule in the right lung base. 4 mm subpleural nodule in the left lung base. These nodules were not seen previously. Several additional smaller nodules are demonstrated. Hepatobiliary: No focal liver lesions are identified. The gallbladder is distended but there is no wall thickening or inflammatory change. No radiopaque stones are demonstrated. Pancreas: Unremarkable. No pancreatic ductal dilatation or surrounding inflammatory changes. Spleen: Normal in size without focal abnormality. Adrenals/Urinary Tract: No adrenal gland nodules. Bilateral intrarenal stones. Largest is in the left lower pole measuring 12 mm in diameter. No hydronephrosis or hydroureter. No ureteral stones are demonstrated. Bladder wall is not thickened. No bladder filling defects. Stomach/Bowel: The stomach is markedly distended, mostly with fluid. No gastric wall thickening is identified. The proximal duodenum is distended. Transition zone appears to be at the second portion of the duodenum as it crosses beneath the superior mesenteric artery. Remainder of the small bowel is decompressed. Scattered stool in the colon. No colonic distention or wall thickening. Appendix is not identified. Vascular/Lymphatic: Calcification of the abdominal aorta. No aneurysm. There is retroperitoneal and periaortic lymphadenopathy with lymph nodes measuring up to about 15 mm diameter. Some of the lymph nodes are calcified. Prominent celiac axis and mesenteric lymphadenopathy. There is a partially calcified mass centrally in the mesentery and surrounding the superior mesenteric artery. The mass measures about 3.3 x 5.5 cm in diameter. Desmoplastic response with radiating scars into the mesentery. Reproductive: Prostate gland is not enlarged. Prostate calcifications are present. Other: No free air in  the abdomen. Small amount of free fluid around the liver edge. Abdominal wall musculature appears intact. Musculoskeletal: Degenerative changes in the spine. No destructive bone lesions. IMPRESSION: 1. Large partially calcified mass in the root of the mesentery with radiating scarring. Additional calcified and noncalcified lymphadenopathy in the retroperitoneum and celiac axis. Differential diagnosis would include carcinoid tumor, sclerosing mesenteritis, or possibly lymphoma. 2. Marked distention of  the stomach and proximal duodenum with transition zone at the second- third portion of the duodenum. Obstruction is likely caused by the mesenteric process described above. 3. Distended gallbladder without evidence of cholelithiasis. 4. Multiple bilateral nonobstructing intrarenal stones. 5. Small amount of free fluid around the liver edge. 6. Aortic calcifications. 7. Small scattered nodules in the lung bases, largest measuring 4 mm. No follow-up needed if patient is low-risk (and has no known or suspected primary neoplasm). Non-contrast chest CT can be considered in 12 months if patient is high-risk. This recommendation follows the consensus statement: Guidelines for Management of Incidental Pulmonary Nodules Detected on CT Images: From the Fleischner Society 2017; Radiology 2017; 284:228-243. Electronically Signed   By: Lucienne Capers M.D.   On: 07/28/2017 21:27    EKG: no new  Assessment/Plan Principal Problem:   Bowel obstruction (HCC) Active Problems:   Essential hypertension, benign   Hyperlipidemia   Intractable nausea and vomiting   Acute on chronic renal failure (HCC)  Obstruction Gen surg consulted by [place] to bridge from eliquis to lovenox  AKI/CKDIII Baseline Cr 1.95, Cr on admit 2.53 1L of normal saline given in the emergency room Gentle hydration overnight Checking magnesium and phosphorus Likely due to GI losses Strict I&O Bicarb IV prn, monitor  for now  Hyperkalemia Mild Likely resolved due to N/V Will recheck  Hypertension When necessary hydralazine 10 mg IV as needed for severe blood pressure  CHF Hold lasix, cozaar  DM Hold Levemir  Diarrhea Hold Imodium  Hyperlipidemia Hold statin  Gout Hold Allopurinol  Code Status: DNR  DVT Prophylaxis: Eliquis-->Lovenox Family Communication: Dgtr at bedside Disposition Plan: Pending Improvement  Status: Inpt tele  Elwin Mocha, MD Family Medicine [facility] [email or web address] Password [email or web address]

## 2017-07-28 NOTE — ED Triage Notes (Signed)
Pt c/o diarrhea for "months". Being seen and has ct scan scheduled for Friday. C/o sudden n/v since this am. Denies pain/gu sx's. A/o. Mm moist.

## 2017-07-28 NOTE — ED Notes (Signed)
Pt actively vomiting. PIV started. Pt given Zofran 4mg  IV per triage standing orders.

## 2017-07-29 ENCOUNTER — Other Ambulatory Visit (HOSPITAL_COMMUNITY): Payer: PPO

## 2017-07-29 ENCOUNTER — Inpatient Hospital Stay (HOSPITAL_COMMUNITY): Payer: PPO

## 2017-07-29 ENCOUNTER — Other Ambulatory Visit: Payer: Self-pay

## 2017-07-29 ENCOUNTER — Ambulatory Visit (HOSPITAL_COMMUNITY): Payer: PPO

## 2017-07-29 ENCOUNTER — Encounter (HOSPITAL_COMMUNITY): Payer: Self-pay

## 2017-07-29 DIAGNOSIS — E782 Mixed hyperlipidemia: Secondary | ICD-10-CM

## 2017-07-29 DIAGNOSIS — N183 Chronic kidney disease, stage 3 (moderate): Secondary | ICD-10-CM

## 2017-07-29 DIAGNOSIS — D484 Neoplasm of uncertain behavior of peritoneum: Secondary | ICD-10-CM

## 2017-07-29 DIAGNOSIS — R112 Nausea with vomiting, unspecified: Secondary | ICD-10-CM

## 2017-07-29 DIAGNOSIS — I10 Essential (primary) hypertension: Secondary | ICD-10-CM

## 2017-07-29 DIAGNOSIS — I25119 Atherosclerotic heart disease of native coronary artery with unspecified angina pectoris: Secondary | ICD-10-CM

## 2017-07-29 DIAGNOSIS — K56609 Unspecified intestinal obstruction, unspecified as to partial versus complete obstruction: Secondary | ICD-10-CM

## 2017-07-29 DIAGNOSIS — Z01810 Encounter for preprocedural cardiovascular examination: Secondary | ICD-10-CM

## 2017-07-29 DIAGNOSIS — I480 Paroxysmal atrial fibrillation: Secondary | ICD-10-CM

## 2017-07-29 DIAGNOSIS — K311 Adult hypertrophic pyloric stenosis: Secondary | ICD-10-CM

## 2017-07-29 DIAGNOSIS — I6523 Occlusion and stenosis of bilateral carotid arteries: Secondary | ICD-10-CM

## 2017-07-29 DIAGNOSIS — E785 Hyperlipidemia, unspecified: Secondary | ICD-10-CM

## 2017-07-29 LAB — BASIC METABOLIC PANEL
Anion gap: 12 (ref 5–15)
Anion gap: 12 (ref 5–15)
BUN: 47 mg/dL — ABNORMAL HIGH (ref 6–20)
BUN: 48 mg/dL — AB (ref 6–20)
CALCIUM: 8.6 mg/dL — AB (ref 8.9–10.3)
CALCIUM: 9 mg/dL (ref 8.9–10.3)
CO2: 23 mmol/L (ref 22–32)
CO2: 21 mmol/L — AB (ref 22–32)
CREATININE: 2.67 mg/dL — AB (ref 0.61–1.24)
CREATININE: 2.68 mg/dL — AB (ref 0.61–1.24)
Chloride: 104 mmol/L (ref 101–111)
Chloride: 106 mmol/L (ref 101–111)
GFR calc Af Amer: 25 mL/min — ABNORMAL LOW (ref >60)
GFR, EST AFRICAN AMERICAN: 25 mL/min — AB (ref >60)
GFR, EST NON AFRICAN AMERICAN: 22 mL/min — AB (ref >60)
GFR, EST NON AFRICAN AMERICAN: 22 mL/min — AB (ref >60)
GLUCOSE: 163 mg/dL — AB (ref 65–99)
GLUCOSE: 95 mg/dL (ref 65–99)
Potassium: 4.5 mmol/L (ref 3.5–5.1)
Potassium: 5.5 mmol/L — ABNORMAL HIGH (ref 3.5–5.1)
SODIUM: 139 mmol/L (ref 135–145)
Sodium: 139 mmol/L (ref 135–145)

## 2017-07-29 LAB — GLUCOSE, CAPILLARY
GLUCOSE-CAPILLARY: 101 mg/dL — AB (ref 65–99)
GLUCOSE-CAPILLARY: 93 mg/dL (ref 65–99)
Glucose-Capillary: 105 mg/dL — ABNORMAL HIGH (ref 65–99)
Glucose-Capillary: 108 mg/dL — ABNORMAL HIGH (ref 65–99)
Glucose-Capillary: 125 mg/dL — ABNORMAL HIGH (ref 65–99)
Glucose-Capillary: 129 mg/dL — ABNORMAL HIGH (ref 65–99)
Glucose-Capillary: 92 mg/dL (ref 65–99)

## 2017-07-29 LAB — CBC
HEMATOCRIT: 31.5 % — AB (ref 39.0–52.0)
Hemoglobin: 9.7 g/dL — ABNORMAL LOW (ref 13.0–17.0)
MCH: 30 pg (ref 26.0–34.0)
MCHC: 30.8 g/dL (ref 30.0–36.0)
MCV: 97.5 fL (ref 78.0–100.0)
PLATELETS: 225 10*3/uL (ref 150–400)
RBC: 3.23 MIL/uL — ABNORMAL LOW (ref 4.22–5.81)
RDW: 17.9 % — AB (ref 11.5–15.5)
WBC: 9 10*3/uL (ref 4.0–10.5)

## 2017-07-29 MED ORDER — PHENOL 1.4 % MT LIQD
1.0000 | OROMUCOSAL | Status: DC | PRN
  Administered 2017-07-29 – 2017-08-03 (×2): 1 via OROMUCOSAL
  Filled 2017-07-29 (×2): qty 177

## 2017-07-29 MED ORDER — ENOXAPARIN SODIUM 100 MG/ML ~~LOC~~ SOLN
1.0000 mg/kg | Freq: Once | SUBCUTANEOUS | Status: AC
  Administered 2017-07-29: 90 mg via SUBCUTANEOUS
  Filled 2017-07-29: qty 1

## 2017-07-29 MED ORDER — LABETALOL HCL 5 MG/ML IV SOLN: 5.0000 mg | INTRAVENOUS | Status: DC | PRN

## 2017-07-29 MED ORDER — ENOXAPARIN SODIUM 100 MG/ML ~~LOC~~ SOLN
90.0000 mg | SUBCUTANEOUS | Status: AC
  Administered 2017-07-30 – 2017-07-31 (×2): 90 mg via SUBCUTANEOUS
  Filled 2017-07-29 (×2): qty 1

## 2017-07-29 MED ORDER — METOPROLOL TARTRATE 5 MG/5ML IV SOLN
2.5000 mg | Freq: Four times a day (QID) | INTRAVENOUS | Status: DC
  Administered 2017-07-29 – 2017-08-09 (×41): 2.5 mg via INTRAVENOUS
  Filled 2017-07-29 (×42): qty 5

## 2017-07-29 NOTE — Progress Notes (Signed)
Pt c/o abdominal pain, no pain medication ordered. Dr.  Aggie Moats paged and made aware. Waiting for orders/call back

## 2017-07-29 NOTE — H&P (View-Only) (Signed)
[facility] Consult  Reason for Consult:Mesentery mass with calcification causing GOO  Referring Physician:  Dr. Mesner/ Dr. Johnson   Chief Complaint    Emesis      Stephen [name] is a 75 y.o. male.  HPI: Stephen [name] is a very pleasant 75 yo with history of Stroke s/p endarderectomy, CKD, CAD s/p stents, RCA occlusion, DM on insulin, HTN, A fib on Eliquis who presents with a 6+ month yesterday of diarrhea and weight loss over 40 lbs who has been admitted to the hospital after coming in with abdominal pain, nausea, and vomiting, and cessation of his diarrhea. He has had other prior admissions with C dif treatment, and workup with GI over the past few weeks including stool studies, 24 hr 5HIAA urine test, and has been following with Dr. Rehman who attempted creon supplements without any improvement in the diarrhea.  Yesterday the patient presented acutely, and underwent a noncontrasted CT a/p that revealed a calcified mass at the root of the mesentery, possible surrounding the SMA, and a dilated stomach with compression of the duodenum, causing a gastric outlet obstruction.  He was brought into the hospital, and I was called overnight and discussed the case with Dr. Mesner, recommending a hospitalist admission given the medical issues and likelihood that this mass is inoperable and needs further workup.      His only prior abdominal surgical history is a appendectomy for presumed normal appendicitis in 1970s. He has had carotid endarterectomy, lumbar surgeries, and coronary stents placed.   I have discussed the patient's history and my thoughts with the patient and his daughter and pastor.    Past Medical History:  Diagnosis Date  . Anemia 06/01/2012   H&H of 10/30 in 8/13 with normal MCV   . Anemia of chronic renal failure, stage 3 (moderate) (HCC) 06/01/2012   H&H of 10/30 in 8/13 with normal MCV  . Atrial fibrillation (HCC)    Documented 08/2016  . B12 deficiency  08/17/2016  . Carotid artery occlusion    Right carotid endarterectomy 2001; left carotid endarterectomy in 2008  . Celiac disease   . Chronic renal disease, stage 3, moderately decreased glomerular filtration rate between 30-59 mL/min/1.73 square meter (HCC) 06/01/2012  . Coronary atherosclerosis of native coronary artery    PTCA of LAD in 1994; total obstruction of the RCA treated medically; subsequent stress nuclear study with inferior ischemia  . DDD (degenerative disc disease), lumbar    Lumbar surgery in 1984  . Diabetes mellitus type II   . Essential hypertension, benign   . History of stroke   . Hyperlipidemia   . Iron deficiency anemia 12/31/2016  . Peripheral vascular disease (HCC)   . Stroke (HCC)    Right brain 2001; right PCA, MCA/PCA, left SCA infarcts 08/2017 with atrial fibrillation    Past Surgical History:  Procedure Laterality Date  . APPENDECTOMY  1970  . CAROTID ENDARTERECTOMY  2008   Left   . CAROTID ENDARTERECTOMY  06/15/2000   Right  . LUMBAR DISC SURGERY  2000, 2001   L5 HNP required 2 surgical procedures    Family History  Problem Relation Age of Onset  . Heart disease Mother        Before age 60  . Diabetes Mother   . Heart attack Mother   . Diabetes Sister   . Heart disease Brother   . Diabetes Brother   . Diabetes Daughter   . Diabetes Son   . Hyperlipidemia   Son     Social History   Tobacco Use  . Smoking status: Former Smoker    Packs/day: 3.00    Years: 10.00    Pack years: 30.00    Types: Cigarettes    Last attempt to quit: 09/21/1972    Years since quitting: 44.8  . Smokeless tobacco: Never Used  Substance Use Topics  . Alcohol use: No    Alcohol/week: 0.0 oz  . Drug use: No    Medications:  I have reviewed the patient's current medications. Prior to Admission:  Medications Prior to Admission  Medication Sig Dispense Refill Last Dose  . acetaminophen (TYLENOL) 500 MG tablet Take 500 mg by mouth every 6 (six) hours as  needed.   07/28/2017 at Unknown time  . allopurinol (ZYLOPRIM) 100 MG tablet Take 200 mg by mouth daily.    07/28/2017 at Unknown time  . apixaban (ELIQUIS) 5 MG TABS tablet Take 1 tablet (5 mg total) by mouth 2 (two) times daily. 180 tablet 3 07/28/2017 at Unknown time  . aspirin 81 MG tablet Take 81 mg by mouth daily.   07/28/2017 at Unknown time  . Cholecalciferol (VITAMIN D-3) 1000 units CAPS Take 1 capsule by mouth daily.   07/28/2017 at Unknown time  . feeding supplement, ENSURE ENLIVE, (ENSURE ENLIVE) LIQD Take 237 mLs by mouth 2 (two) times daily between meals. 90 Bottle 0 Past Month at Unknown time  . furosemide (LASIX) 40 MG tablet Take 1 tablet (40 mg total) by mouth daily as needed for fluid or edema (or more than 2-lbs of weight gain). (Patient taking differently: Take 60 mg daily by mouth. ) 225 tablet 2 07/28/2017 at Unknown time  . insulin detemir (LEVEMIR) 100 UNIT/ML injection Inject 0.2 mLs (20 Units total) into the skin at bedtime. 10 mL 11 07/27/2017 at Unknown time  . loperamide (IMODIUM) 2 MG capsule Take 1 capsule (2 mg total) by mouth 3 (three) times daily before meals. 90 capsule 0 07/28/2017 at Unknown time  . losartan (COZAAR) 100 MG tablet Take 100 mg by mouth daily.   07/28/2017 at Unknown time  . Melatonin 10 MG CAPS Take 10 mg by mouth at bedtime.   07/27/2017 at Unknown time  . OVER THE COUNTER MEDICATION OTC Sleepinal - patient takes 1 at bedtime.   07/27/2017 at Unknown time  . pantoprazole (PROTONIX) 40 MG tablet Take 1 tablet (40 mg total) by mouth daily before breakfast. 30 tablet 5 07/28/2017 at Unknown time  . pravastatin (PRAVACHOL) 40 MG tablet Take 40 mg by mouth daily.   07/27/2017 at Unknown time  . Probiotic Product (PROBIOTIC DAILY PO) Take 1 tablet daily by mouth.    07/28/2017 at Unknown time  . prochlorperazine (COMPAZINE) 10 MG tablet Take 10 mg every 6 (six) hours as needed by mouth for nausea or vomiting.    07/28/2017 at Unknown time  . Psyllium (EQ DAILY FIBER  PO) Take 1 capsule every morning by mouth. Per Patient's daughter states that he takes 1 a every morning.    07/28/2017 at Unknown time  . sodium bicarbonate 650 MG tablet Take 650 mg by mouth 3 (three) times daily.    07/28/2017 at Unknown time  . vitamin B-12 (CYANOCOBALAMIN) 1000 MCG tablet Take 1 tablet (1,000 mcg total) by mouth daily. Take 2 tabs by mouth daily. 60 tablet 5 07/28/2017 at Unknown time   Scheduled: . [START ON 07/30/2017] enoxaparin (LOVENOX) injection  90 mg Subcutaneous Q24H  . insulin aspart    0-20 Units Subcutaneous Q4H  . sodium chloride flush  3 mL Intravenous Q12H   Continuous: . sodium chloride 100 mL/hr at 07/29/17 0958   No Known Allergies  ROS:  A comprehensive review of systems was negative except for: Constitutional: positive for weight loss Gastrointestinal: positive for abdominal pain, diarrhea, nausea and vomiting Endocrine: positive for diabetes on insulin  Blood pressure (!) 156/89, pulse (!) 110, temperature 97.9 F (36.6 C), temperature source Oral, resp. rate 18, height 6' (1.829 m), weight 187 lb 2.7 oz (84.9 kg), SpO2 97 %. Physical Exam  Constitutional: He is oriented to person, place, and time. Vital signs are normal. He appears malnourished. He appears cachectic.  HENT:  Head: Normocephalic.  Eyes: Pupils are equal, round, and reactive to light.  Cardiovascular: Normal rate. An irregular rhythm present.  Pulmonary/Chest: Effort normal and breath sounds normal.  Abdominal: Soft. He exhibits distension. There is no tenderness. There is no rebound and no guarding.  Musculoskeletal: Normal range of motion. He exhibits edema.  Lymphadenopathy:       Head (right side): No submandibular adenopathy present.       Head (left side): No submandibular adenopathy present.    He has no cervical adenopathy.    He has no axillary adenopathy.       Right: No inguinal and no supraclavicular adenopathy present.       Left: No inguinal and no supraclavicular  adenopathy present.  Neurological: He is alert and oriented to person, place, and time.  Skin: Skin is warm and dry.  Psychiatric: Mood, memory, affect and judgment normal.  Vitals reviewed.   Results: Results for orders placed or performed during the hospital encounter of 07/28/17 (from the past 48 hour(s))  Urinalysis, Routine w reflex microscopic     Status: Abnormal   Collection Time: 07/28/17  3:11 PM  Result Value Ref Range   Color, Urine YELLOW YELLOW   APPearance HAZY (A) CLEAR   Specific Gravity, Urine 1.017 1.005 - 1.030   pH 5.0 5.0 - 8.0   Glucose, UA NEGATIVE NEGATIVE mg/dL   Hgb urine dipstick MODERATE (A) NEGATIVE   Bilirubin Urine NEGATIVE NEGATIVE   Ketones, ur NEGATIVE NEGATIVE mg/dL   Protein, ur NEGATIVE NEGATIVE mg/dL   Nitrite NEGATIVE NEGATIVE   Leukocytes, UA SMALL (A) NEGATIVE   RBC / HPF 6-30 0 - 5 RBC/hpf   WBC, UA 6-30 0 - 5 WBC/hpf   Bacteria, UA RARE (A) NONE SEEN   Squamous Epithelial / LPF 0-5 (A) NONE SEEN   Hyaline Casts, UA PRESENT   POC CBG, ED     Status: Abnormal   Collection Time: 07/28/17  3:13 PM  Result Value Ref Range   Glucose-Capillary 144 (H) 65 - 99 mg/dL  Comprehensive metabolic panel     Status: Abnormal   Collection Time: 07/28/17  4:58 PM  Result Value Ref Range   Sodium 138 135 - 145 mmol/L   Potassium 5.5 (H) 3.5 - 5.1 mmol/L   Chloride 106 101 - 111 mmol/L   CO2 20 (L) 22 - 32 mmol/L   Glucose, Bld 153 (H) 65 - 99 mg/dL   BUN 44 (H) 6 - 20 mg/dL   Creatinine, Ser 2.53 (H) 0.61 - 1.24 mg/dL   Calcium 9.1 8.9 - 10.3 mg/dL   Total Protein 8.2 (H) 6.5 - 8.1 g/dL   Albumin 3.7 3.5 - 5.0 g/dL   AST 18 15 - 41 U/L   ALT 12 (L) 17 -   63 U/L   Alkaline Phosphatase 88 38 - 126 U/L   Total Bilirubin 0.5 0.3 - 1.2 mg/dL   GFR calc non Af Amer 23 (L) >60 mL/min   GFR calc Af Amer 27 (L) >60 mL/min    Comment: (NOTE) The eGFR has been calculated using the CKD EPI equation. This calculation has not been validated in all  clinical situations. eGFR's persistently <60 mL/min signify possible Chronic Kidney Disease.    Anion gap 12 5 - 15  CBC with Differential     Status: Abnormal   Collection Time: 07/28/17  4:58 PM  Result Value Ref Range   WBC 8.4 4.0 - 10.5 K/uL   RBC 3.66 (L) 4.22 - 5.81 MIL/uL   Hemoglobin 10.8 (L) 13.0 - 17.0 g/dL   HCT 36.3 (L) 39.0 - 52.0 %   MCV 99.2 78.0 - 100.0 fL   MCH 29.5 26.0 - 34.0 pg   MCHC 29.8 (L) 30.0 - 36.0 g/dL   RDW 17.9 (H) 11.5 - 15.5 %   Platelets 234 150 - 400 K/uL   Neutrophils Relative % 81 %   Neutro Abs 6.9 1.7 - 7.7 K/uL   Lymphocytes Relative 14 %   Lymphs Abs 1.1 0.7 - 4.0 K/uL   Monocytes Relative 5 %   Monocytes Absolute 0.4 0.1 - 1.0 K/uL   Eosinophils Relative 0 %   Eosinophils Absolute 0.0 0.0 - 0.7 K/uL   Basophils Relative 0 %   Basophils Absolute 0.0 0.0 - 0.1 K/uL  Lipase, blood     Status: None   Collection Time: 07/28/17  5:39 PM  Result Value Ref Range   Lipase 26 11 - 51 U/L  Basic metabolic panel     Status: Abnormal   Collection Time: 07/28/17 11:44 PM  Result Value Ref Range   Sodium 139 135 - 145 mmol/L   Potassium 5.5 (H) 3.5 - 5.1 mmol/L   Chloride 106 101 - 111 mmol/L   CO2 21 (L) 22 - 32 mmol/L   Glucose, Bld 163 (H) 65 - 99 mg/dL   BUN 47 (H) 6 - 20 mg/dL   Creatinine, Ser 2.67 (H) 0.61 - 1.24 mg/dL   Calcium 9.0 8.9 - 10.3 mg/dL   GFR calc non Af Amer 22 (L) >60 mL/min   GFR calc Af Amer 25 (L) >60 mL/min    Comment: (NOTE) The eGFR has been calculated using the CKD EPI equation. This calculation has not been validated in all clinical situations. eGFR's persistently <60 mL/min signify possible Chronic Kidney Disease.    Anion gap 12 5 - 15  Glucose, capillary     Status: Abnormal   Collection Time: 07/29/17 12:43 AM  Result Value Ref Range   Glucose-Capillary 129 (H) 65 - 99 mg/dL   Comment 1 Notify RN    Comment 2 Document in Chart   Basic metabolic panel     Status: Abnormal   Collection Time: 07/29/17   4:16 AM  Result Value Ref Range   Sodium 139 135 - 145 mmol/L   Potassium 4.5 3.5 - 5.1 mmol/L    Comment: DELTA CHECK NOTED   Chloride 104 101 - 111 mmol/L   CO2 23 22 - 32 mmol/L   Glucose, Bld 95 65 - 99 mg/dL   BUN 48 (H) 6 - 20 mg/dL   Creatinine, Ser 2.68 (H) 0.61 - 1.24 mg/dL   Calcium 8.6 (L) 8.9 - 10.3 mg/dL   GFR calc non Af Wyvonnia Lora  22 (L) >60 mL/min   GFR calc Af Amer 25 (L) >60 mL/min    Comment: (NOTE) The eGFR has been calculated using the CKD EPI equation. This calculation has not been validated in all clinical situations. eGFR's persistently <60 mL/min signify possible Chronic Kidney Disease.    Anion gap 12 5 - 15  CBC     Status: Abnormal   Collection Time: 07/29/17  4:16 AM  Result Value Ref Range   WBC 9.0 4.0 - 10.5 K/uL   RBC 3.23 (L) 4.22 - 5.81 MIL/uL   Hemoglobin 9.7 (L) 13.0 - 17.0 g/dL   HCT 31.5 (L) 39.0 - 52.0 %   MCV 97.5 78.0 - 100.0 fL   MCH 30.0 26.0 - 34.0 pg   MCHC 30.8 30.0 - 36.0 g/dL   RDW 17.9 (H) 11.5 - 15.5 %   Platelets 225 150 - 400 K/uL  Glucose, capillary     Status: None   Collection Time: 07/29/17  4:22 AM  Result Value Ref Range   Glucose-Capillary 92 65 - 99 mg/dL  Glucose, capillary     Status: Abnormal   Collection Time: 07/29/17  7:52 AM  Result Value Ref Range   Glucose-Capillary 105 (H) 65 - 99 mg/dL    24 hr 5HIAA quant- 47.9 ++ 10/17  Fecal fat - WNL 10/16 C dif negative 10/15  Personally reviewed CT scan- calcified mass close to the root of the SMA but not at the origin with stellate appearance, difficult to appreciate plain but does not appear to be related to the pancreas, gastric distention from compression of mass on duodenum, decompressed small bowel, adenopathy, lung nodules scattered   Ct Abdomen Pelvis Wo Contrast  Result Date: 07/28/2017 CLINICAL DATA:  Diarrhea for months. Sudden severe nausea and vomiting all day today. Abdominal pain. EXAM: CT ABDOMEN AND PELVIS WITHOUT CONTRAST TECHNIQUE:  Multidetector CT imaging of the abdomen and pelvis was performed following the standard protocol without IV contrast. COMPARISON:  11/18/2006 FINDINGS: Lower chest: 4 mm subpleural nodule in the right lung base. 4 mm subpleural nodule in the left lung base. These nodules were not seen previously. Several additional smaller nodules are demonstrated. Hepatobiliary: No focal liver lesions are identified. The gallbladder is distended but there is no wall thickening or inflammatory change. No radiopaque stones are demonstrated. Pancreas: Unremarkable. No pancreatic ductal dilatation or surrounding inflammatory changes. Spleen: Normal in size without focal abnormality. Adrenals/Urinary Tract: No adrenal gland nodules. Bilateral intrarenal stones. Largest is in the left lower pole measuring 12 mm in diameter. No hydronephrosis or hydroureter. No ureteral stones are demonstrated. Bladder wall is not thickened. No bladder filling defects. Stomach/Bowel: The stomach is markedly distended, mostly with fluid. No gastric wall thickening is identified. The proximal duodenum is distended. Transition zone appears to be at the second portion of the duodenum as it crosses beneath the superior mesenteric artery. Remainder of the small bowel is decompressed. Scattered stool in the colon. No colonic distention or wall thickening. Appendix is not identified. Vascular/Lymphatic: Calcification of the abdominal aorta. No aneurysm. There is retroperitoneal and periaortic lymphadenopathy with lymph nodes measuring up to about 15 mm diameter. Some of the lymph nodes are calcified. Prominent celiac axis and mesenteric lymphadenopathy. There is a partially calcified mass centrally in the mesentery and surrounding the superior mesenteric artery. The mass measures about 3.3 x 5.5 cm in diameter. Desmoplastic response with radiating scars into the mesentery. Reproductive: Prostate gland is not enlarged. Prostate calcifications are present. Other:    No free air in the abdomen. Small amount of free fluid around the liver edge. Abdominal wall musculature appears intact. Musculoskeletal: Degenerative changes in the spine. No destructive bone lesions. IMPRESSION: 1. Large partially calcified mass in the root of the mesentery with radiating scarring. Additional calcified and noncalcified lymphadenopathy in the retroperitoneum and celiac axis. Differential diagnosis would include carcinoid tumor, sclerosing mesenteritis, or possibly lymphoma. 2. Marked distention of the stomach and proximal duodenum with transition zone at the second- third portion of the duodenum. Obstruction is likely caused by the mesenteric process described above. 3. Distended gallbladder without evidence of cholelithiasis. 4. Multiple bilateral nonobstructing intrarenal stones. 5. Small amount of free fluid around the liver edge. 6. Aortic calcifications. 7. Small scattered nodules in the lung bases, largest measuring 4 mm. No follow-up needed if patient is low-risk (and has no known or suspected primary neoplasm). Non-contrast chest CT can be considered in 12 months if patient is high-risk. This recommendation follows the consensus statement: Guidelines for Management of Incidental Pulmonary Nodules Detected on CT Images: From the Fleischner Society 2017; Radiology 2017; 284:228-243. Electronically Signed   By: William  Stevens M.D.   On: 07/28/2017 21:27   Dg Chest Portable 1 View  Result Date: 07/28/2017 CLINICAL DATA:  NG tube placement EXAM: PORTABLE CHEST 1 VIEW COMPARISON:  07/09/2015 FINDINGS: Minimal bibasilar atelectasis. Cardiomediastinal silhouette within normal limits. Aortic atherosclerosis. No pneumothorax. Esophageal tube courses toward the diaphragm but the tip is poorly visible, possibly over the GE junction. Gaseous enlargement of the stomach IMPRESSION: 1. Minimal bibasilar atelectasis 2. Poor visibility of the tip of the esophageal tube, it possibly projects over the  distal esophagus. The stomach appears enlarged. Electronically Signed   By: Kim  Fujinaga M.D.   On: 07/28/2017 23:20     Assessment & Plan:  Stephen [name] is a 75 y.o. male with a calcified 3X5cm mass in his mesentery close to the SMA, having the appearance of possible carcinoid tumor and a 5HIAA urine study that was elevated about 1 month ago. The patient also has had symptoms of diarrhea for some time.  Given these findings, I discussed with the family and patient my suspicion that this could be a carcinoid tumor and that given the location surgical removal would not likely be possible.  Given the GOO caused by the tumor a Gastrojejunostomy to allow for decompression of the stomach and for comfort is likely needed.  I discussed with them several issues need to be addressed prior to any bypass with gastrojejunostomy.  1.) Confirmation of carcinoid tumor   Would get Oncology involved and see their thoughts too, if they would treat based on symptoms and 5HIAA, versus  needing further workup with possible Octreotide scan, etc   Could possibly order other tests, Chromogranin, versus urine serotonin given that this is possibly more foregut   Biopsy of the lesion would be difficult given the location and vascularity but can be discussed if needed  2.) Cardiology risk stratification given the history of CAD, Afib/ Carotid disease with Stroke, occlusion of RCA  May not be able to perform surgery at [place] if high risk given anesthesia capabilities, discussed this with the family 3.) Once Cardiology weighs in and Oncology is comfortable with diagnosis, would plan for gastrojejunostomy to help decompress stomach and remove NG tube  4.) Keep NPO, NG tube for now, may consider TPN given weight loss and possible extended period without food, would not anticipate any surgery before next week given needed   evaluations    All questions were answered to the satisfaction of the patient and family. Information  about carcinoid tumor and syndrome given to the family but discussed we do not have a definitive diagnosis at this time but this is a possibility.   Updated Dr. Wynetta Emery.    Virl Cagey 07/29/2017, 10:10 AM

## 2017-07-29 NOTE — Progress Notes (Signed)
Nutrition Follow-up  DOCUMENTATION CODES:     INTERVENTION:  Follow for nutritional care plan   NUTRITION DIAGNOSIS:   Severe Malnutrition(:ongoing.) related to altered GI function(chronic) as evidenced by moderate fat depletion, moderate muscle depletion and diarrhea > 3 months.  GOAL:  Pt to meet >/= 90% of their estimated nutrition needs    MONITOR:   Diet advancement, I & O's, Labs, Weight trends  REASON FOR ASSESSMENT:   Malnutrition Screening Tool    ASSESSMENT:  Pt assessed by RD during hospitalization on 10/16 and diagnosed with severe malnutrition, chronic diarrhea and additional hx of CAD, PVD, Stroke, B-12 deficiency, iron deficiency anemia, celiac dz, CKD-3. He presents emesis (intactable nausea and vomiting).  Imaging: CT abdomen /pelvis: "Marked distention of the stomach and proximal duodenum....obstruction. Large partially calcified mass".  Chest diagnostic: 11/7 NGT placed- "Poor visibility of the tip of the esophageal tube, it possibly projects over the distal esophagus. The stomach appears enlarged".  NGT-Output over night -3 liters per his nurse. Patient says he is feeling more comfortable and is very hungry.   Discussions with him about surgical options (possible bypass with a gastrojejunosptmy, oncology consulted (pt scheduled for PET 11/20)and mesenteric mass biopsy as outpatient. He has been assessed by cardiology and cleared for gastrojejunostomy.  Patient weight is stable compared to admission last month within 2 lbs. His weight was up briefly early in this month but pt says this was related to fluid gain.  He weight starting decreasing six months ago in around May. He has been having problems with diarrhea since then. The patient says it has not been associated with any certain food in particular. He has been eating mostly fruits and yogurts, no meats, limited vegetables.  He continues to meet criteria for malnutrition given his muscle and fat depletions,  limited oral; intake and persisting chronic diarrhea. He c/o increased weakness and diminished ability to walk.   Recent Labs  Lab 07/28/17 1658 07/28/17 2344 07/29/17 0416  NA 138 139 139  K 5.5* 5.5* 4.5  CL 106 106 104  CO2 20* 21* 23  BUN 44* 47* 48*  CREATININE 2.53* 2.67* 2.68*  CALCIUM 9.1 9.0 8.6*  GLUCOSE 153* 163* 95  Labs and meds reviewed. Abnormal renal fxn- CKD -3   NUTRITION - FOCUSED PHYSICAL EXAM:    Most Recent Value  Orbital Region  Mild depletion  Upper Arm Region  Moderate depletion  Buccal Region  Mild depletion  Temple Region  Moderate depletion  Clavicle and Acromion Bone Region  Moderate depletion  Dorsal Hand  Moderate depletion  Posterior Calf Region  Moderate depletion  Edema (RD Assessment)  Mild [right foot]  Hair  Reviewed  Eyes  Reviewed  Mouth  Reviewed  Skin  Reviewed  Nails  Reviewed     Diet Order:  Diet NPO time specified Except for: Ice Chips  EDUCATION NEEDS:   No education needs have been identified at this time Skin:  Skin Assessment: Reviewed RN Assessment  Last BM:  11/7 distended abdomen  Height:   Ht Readings from Last 1 Encounters:  07/29/17 6' (1.829 m)    Weight:   Wt Readings from Last 1 Encounters:  07/29/17 187 lb 2.7 oz (84.9 kg)    Ideal Body Weight:  80.9 kg  BMI:  Body mass index is 25.38 kg/m.  Estimated Nutritional Needs:   Kcal:  8413-2440   (25-28 kcal/kg adj wt )  Protein:  75-80 gr (0.9 gr/kg)  Fluid:  2.0  liters daily    Colman Cater MS,RD,CSG,LDN Office: #[phone] Pager: [phone]

## 2017-07-29 NOTE — Consult Note (Signed)
Consulted to see patient regarding possible carcinoid tumor.   Patient is well known to me and sees me for anemia related to CKD currently on aranesp and B12 deficiency.   Patient admitted to the hospital for N/V/D and found to have a SBO. Ct abd/pelvis 07/28/17 demonstrated large 3.3 x 5.5 cm partially calcified mass in the root of the mesentery with radiating scarring. Additional calcified and noncalcified lymphadenopathy in the retroperitoneum and celiac axis. Differential diagnosis would include carcinoid tumor, sclerosing mesenteritis, or possibly lymphoma.   He has a PET scan scheduled for 08/10/17. He will need this mesenteric mass biopsied for definitive diagnosis, will schedule this as an outpatient once I see him in clinic. No plans for any treatment inpatient, since he needs to improve from the SBO.   I have made him an appt to come see me on 08/16/17 at 8:30 am, I will review his PET scan results with him at that time and discuss biopsy of this mesenteric mass.  Please call me with any questions.  Twana First, MD

## 2017-07-29 NOTE — Progress Notes (Signed)
ANTICOAGULATION CONSULT NOTE - Preliminary  Pharmacy Consult for enoxaparin Indication: bridge from apixaban for possible surgery  No Known Allergies  Patient Measurements:       Vital Signs: Temp: 99 F (37.2 C) (11/07 1942) Temp Source: Oral (11/07 1507) BP: 139/70 (11/08 0000) Pulse Rate: 118 (11/08 0000)  Labs: Recent Labs    07/28/17 1658 07/28/17 2344  HGB 10.8*  --   HCT 36.3*  --   PLT 234  --   CREATININE 2.53* 2.67*   Estimated Creatinine Clearance: 25.8 mL/min (A) (by C-G formula based on SCr of 2.67 mg/dL (H)).  Medical History: Past Medical History:  Diagnosis Date  . Anemia 06/01/2012   H&H of 10/30 in 8/13 with normal MCV   . Anemia of chronic renal failure, stage 3 (moderate) (HCC) 06/01/2012   H&H of 10/30 in 8/13 with normal MCV  . Atrial fibrillation ([place])    Documented 08/2016  . B12 deficiency 08/17/2016  . Carotid artery occlusion    Right carotid endarterectomy 2001; left carotid endarterectomy in 2008  . Celiac disease   . Chronic renal disease, stage 3, moderately decreased glomerular filtration rate between 30-59 mL/min/1.73 square meter ([place]) 06/01/2012  . Coronary atherosclerosis of native coronary artery    PTCA of LAD in 1994; total obstruction of the RCA treated medically; subsequent stress nuclear study with inferior ischemia  . DDD (degenerative disc disease), lumbar    Lumbar surgery in 1984  . Diabetes mellitus type II   . Essential hypertension, benign   . History of stroke   . Hyperlipidemia   . Iron deficiency anemia 12/31/2016  . Peripheral vascular disease ([place])   . Stroke [facility])    Right brain 2001; right PCA, MCA/PCA, left SCA infarcts 08/2017 with atrial fibrillation    Medications:  Medications Prior to Admission  Medication Sig Dispense Refill Last Dose  . acetaminophen (TYLENOL) 500 MG tablet Take 500 mg by mouth every 6 (six) hours as needed.   07/28/2017 at Unknown time  . allopurinol (ZYLOPRIM) 100 MG tablet  Take 200 mg by mouth daily.    07/28/2017 at Unknown time  . apixaban (ELIQUIS) 5 MG TABS tablet Take 1 tablet (5 mg total) by mouth 2 (two) times daily. 180 tablet 3 07/28/2017 at Unknown time  . aspirin 81 MG tablet Take 81 mg by mouth daily.   07/28/2017 at Unknown time  . Cholecalciferol (VITAMIN D-3) 1000 units CAPS Take 1 capsule by mouth daily.   07/28/2017 at Unknown time  . feeding supplement, ENSURE ENLIVE, (ENSURE ENLIVE) LIQD Take 237 mLs by mouth 2 (two) times daily between meals. 90 Bottle 0 Past Month at Unknown time  . furosemide (LASIX) 40 MG tablet Take 1 tablet (40 mg total) by mouth daily as needed for fluid or edema (or more than 2-lbs of weight gain). (Patient taking differently: Take 60 mg daily by mouth. ) 225 tablet 2 07/28/2017 at Unknown time  . insulin detemir (LEVEMIR) 100 UNIT/ML injection Inject 0.2 mLs (20 Units total) into the skin at bedtime. 10 mL 11 07/27/2017 at Unknown time  . loperamide (IMODIUM) 2 MG capsule Take 1 capsule (2 mg total) by mouth 3 (three) times daily before meals. 90 capsule 0 07/28/2017 at Unknown time  . losartan (COZAAR) 100 MG tablet Take 100 mg by mouth daily.   07/28/2017 at Unknown time  . Melatonin 10 MG CAPS Take 10 mg by mouth at bedtime.   07/27/2017 at Unknown time  .  OVER THE COUNTER MEDICATION OTC Sleepinal - patient takes 1 at bedtime.   07/27/2017 at Unknown time  . pantoprazole (PROTONIX) 40 MG tablet Take 1 tablet (40 mg total) by mouth daily before breakfast. 30 tablet 5 07/28/2017 at Unknown time  . pravastatin (PRAVACHOL) 40 MG tablet Take 40 mg by mouth daily.   07/27/2017 at Unknown time  . Probiotic Product (PROBIOTIC DAILY PO) Take 1 tablet daily by mouth.    07/28/2017 at Unknown time  . prochlorperazine (COMPAZINE) 10 MG tablet Take 10 mg every 6 (six) hours as needed by mouth for nausea or vomiting.    07/28/2017 at Unknown time  . Psyllium (EQ DAILY FIBER PO) Take 1 capsule every morning by mouth. Per Patient's daughter states that  he takes 1 a every morning.    07/28/2017 at Unknown time  . sodium bicarbonate 650 MG tablet Take 650 mg by mouth 3 (three) times daily.    07/28/2017 at Unknown time  . vitamin B-12 (CYANOCOBALAMIN) 1000 MCG tablet Take 1 tablet (1,000 mcg total) by mouth daily. Take 2 tabs by mouth daily. 60 tablet 5 07/28/2017 at Unknown time   Scheduled:  . enoxaparin (LOVENOX) injection  1 mg/kg Subcutaneous Once  . insulin aspart  0-20 Units Subcutaneous Q4H  . sodium chloride flush  3 mL Intravenous Q12H   Infusions:  . sodium chloride     PRN: acetaminophen **OR** acetaminophen, hydrALAZINE, ondansetron **OR** ondansetron (ZOFRAN) IV  Assessment: Mr. Olden is 76 yo male with a. Fib, CKD who presented to ED with nausea/vomiting. Abdominal CT shos abdominal mass around root of the mesentery.  Surgery to evaluate in AM.  Pt is on apixiban for a. Fib.  Last dose at 0700 on 11/7.  Asked to switch to lovenox for possible surgery.    Plan:  Lovenox 1mg /kg now. Preliminary review of pertinent patient information completed.  Forestine Na clinical pharmacist will complete review during morning rounds to assess the patient and finalize treatment regimen.  Jacqualynn Parco, Magdalene Molly, RPH 07/29/2017,12:35 AM

## 2017-07-29 NOTE — Progress Notes (Signed)
PROGRESS NOTE  Stephen [name]  LGX:[id number]  DOB: [birth year]  DOA: 07/28/2017 PCP: [name], MD   Brief Admission Hx: Stephen [name] is a 76 y.o. male with past medical history significant for atrial fibrillation, stroke, CKD 3 and anemia presents the emergency room with chief complaint of nausea vomiting.  CT of the abdomen and pelvis showed abdominal mass around the root of the mesentery.  MDM/Assessment & Plan:   1. Mesenteric mass with bowel obstruction - Pt feels much better with NGT decompression.  Continue IVFs.  General surgery has been consulted.  Eliquis discontinued and started on lovenox bridge.   2. Acute on chronic CKD stage 3 - continue IVF hydration and monitor labs.  3. Hyperkalemia - resolved now.   4. Essential Hypertension - holding home BP meds for now.  IV hydralazine as needed.  5. Chronic Atrial Fibrillation - monitor rate, anticoagulation as above, follow on telemetry 6. Chronic diastolic CHF - stable,  Holding home cozaar and lasix.  7. Type 2 Diabetes mellitus - supplemental sliding scale insulin ordered.  8. Dyslipidemia - holding statin for now.  9. Gout - holding home allopurinol.  10. Diarrhea - likely secondary to #1.  Holding home lomotil secondary to bowel obstruction.   DVT prophylaxis: lovenox Code Status: DNR Family Communication: daughter Disposition Plan: TBD   Consultants:  General surgery   Subjective: Pt says that he is feeling a lot better.   Objective: Vitals:   07/28/17 2330 07/29/17 0000 07/29/17 0043 07/29/17 0500  BP: 127/69 139/70 (!) 156/89   Pulse: (!) 123 (!) 118 (!) 110   Resp: 18 18    Temp:   97.9 F (36.6 C)   TempSrc:   Oral   SpO2: 92% 97%    Weight:   85 kg (187 lb 6.3 oz) 84.9 kg (187 lb 2.7 oz)  Height:   6' (1.829 m)     Intake/Output Summary (Last 24 hours) at 07/29/2017 0916 Last data filed at 07/29/2017 0424 Gross per 24 hour  Intake 1306.67 ml  Output 3100 ml  Net -1793.33 ml   Filed  Weights   07/29/17 0043 07/29/17 0500  Weight: 85 kg (187 lb 6.3 oz) 84.9 kg (187 lb 2.7 oz)     REVIEW OF SYSTEMS  As per history otherwise all reviewed and reported negative  Exam:  General exam: thin male, awake, alert, NG in place.  NAD.  Respiratory system: Clear. No increased work of breathing. Cardiovascular system: normal S1 & S2 heard. No JVD.  Gastrointestinal system: Abdomen is nondistended, soft and nontender. Normal bowel sounds heard. Central nervous system: Alert and oriented. No focal neurological deficits. Extremities: no CCE.  Data Reviewed: Basic Metabolic Panel: Recent Labs  Lab 07/28/17 1658 07/28/17 2344 07/29/17 0416  NA 138 139 139  K 5.5* 5.5* 4.5  CL 106 106 104  CO2 20* 21* 23  GLUCOSE 153* 163* 95  BUN 44* 47* 48*  CREATININE 2.53* 2.67* 2.68*  CALCIUM 9.1 9.0 8.6*   Liver Function Tests: Recent Labs  Lab 07/28/17 1658  AST 18  ALT 12*  ALKPHOS 88  BILITOT 0.5  PROT 8.2*  ALBUMIN 3.7   Recent Labs  Lab 07/28/17 1739  LIPASE 26   No results for input(s): AMMONIA in the last 168 hours. CBC: Recent Labs  Lab 07/28/17 1658 07/29/17 0416  WBC 8.4 9.0  NEUTROABS 6.9  --   HGB 10.8* 9.7*  HCT 36.3* 31.5*  MCV 99.2 97.5  PLT 234 225   Cardiac Enzymes: No results for input(s): CKTOTAL, CKMB, CKMBINDEX, TROPONINI in the last 168 hours. CBG (last 3)  Recent Labs    07/29/17 0043 07/29/17 0422 07/29/17 0752  GLUCAP 129* 92 105*   No results found for this or any previous visit (from the past 240 hour(s)).   Studies: Ct Abdomen Pelvis Wo Contrast  Result Date: 07/28/2017 CLINICAL DATA:  Diarrhea for months. Sudden severe nausea and vomiting all day today. Abdominal pain. EXAM: CT ABDOMEN AND PELVIS WITHOUT CONTRAST TECHNIQUE: Multidetector CT imaging of the abdomen and pelvis was performed following the standard protocol without IV contrast. COMPARISON:  11/18/2006 FINDINGS: Lower chest: 4 mm subpleural nodule in the right  lung base. 4 mm subpleural nodule in the left lung base. These nodules were not seen previously. Several additional smaller nodules are demonstrated. Hepatobiliary: No focal liver lesions are identified. The gallbladder is distended but there is no wall thickening or inflammatory change. No radiopaque stones are demonstrated. Pancreas: Unremarkable. No pancreatic ductal dilatation or surrounding inflammatory changes. Spleen: Normal in size without focal abnormality. Adrenals/Urinary Tract: No adrenal gland nodules. Bilateral intrarenal stones. Largest is in the left lower pole measuring 12 mm in diameter. No hydronephrosis or hydroureter. No ureteral stones are demonstrated. Bladder wall is not thickened. No bladder filling defects. Stomach/Bowel: The stomach is markedly distended, mostly with fluid. No gastric wall thickening is identified. The proximal duodenum is distended. Transition zone appears to be at the second portion of the duodenum as it crosses beneath the superior mesenteric artery. Remainder of the small bowel is decompressed. Scattered stool in the colon. No colonic distention or wall thickening. Appendix is not identified. Vascular/Lymphatic: Calcification of the abdominal aorta. No aneurysm. There is retroperitoneal and periaortic lymphadenopathy with lymph nodes measuring up to about 15 mm diameter. Some of the lymph nodes are calcified. Prominent celiac axis and mesenteric lymphadenopathy. There is a partially calcified mass centrally in the mesentery and surrounding the superior mesenteric artery. The mass measures about 3.3 x 5.5 cm in diameter. Desmoplastic response with radiating scars into the mesentery. Reproductive: Prostate gland is not enlarged. Prostate calcifications are present. Other: No free air in the abdomen. Small amount of free fluid around the liver edge. Abdominal wall musculature appears intact. Musculoskeletal: Degenerative changes in the spine. No destructive bone lesions.  IMPRESSION: 1. Large partially calcified mass in the root of the mesentery with radiating scarring. Additional calcified and noncalcified lymphadenopathy in the retroperitoneum and celiac axis. Differential diagnosis would include carcinoid tumor, sclerosing mesenteritis, or possibly lymphoma. 2. Marked distention of the stomach and proximal duodenum with transition zone at the second- third portion of the duodenum. Obstruction is likely caused by the mesenteric process described above. 3. Distended gallbladder without evidence of cholelithiasis. 4. Multiple bilateral nonobstructing intrarenal stones. 5. Small amount of free fluid around the liver edge. 6. Aortic calcifications. 7. Small scattered nodules in the lung bases, largest measuring 4 mm. No follow-up needed if patient is low-risk (and has no known or suspected primary neoplasm). Non-contrast chest CT can be considered in 12 months if patient is high-risk. This recommendation follows the consensus statement: Guidelines for Management of Incidental Pulmonary Nodules Detected on CT Images: From the Fleischner Society 2017; Radiology 2017; 284:228-243. Electronically Signed   By: Lucienne Capers M.D.   On: 07/28/2017 21:27   Dg Chest Portable 1 View  Result Date: 07/28/2017 CLINICAL DATA:  NG tube placement EXAM: PORTABLE CHEST 1 VIEW COMPARISON:  07/09/2015 FINDINGS:  Minimal bibasilar atelectasis. Cardiomediastinal silhouette within normal limits. Aortic atherosclerosis. No pneumothorax. Esophageal tube courses toward the diaphragm but the tip is poorly visible, possibly over the GE junction. Gaseous enlargement of the stomach IMPRESSION: 1. Minimal bibasilar atelectasis 2. Poor visibility of the tip of the esophageal tube, it possibly projects over the distal esophagus. The stomach appears enlarged. Electronically Signed   By: Donavan Foil M.D.   On: 07/28/2017 23:20   Scheduled Meds: . [START ON 07/30/2017] enoxaparin (LOVENOX) injection  90 mg  Subcutaneous Q24H  . insulin aspart  0-20 Units Subcutaneous Q4H  . sodium chloride flush  3 mL Intravenous Q12H   Continuous Infusions: . sodium chloride 100 mL/hr at 07/29/17 0120    Principal Problem:   Bowel obstruction (HCC) Active Problems:   Essential hypertension, benign   Hyperlipidemia   Intractable nausea and vomiting   Acute on chronic renal failure ([place])  Time spent:   Irwin Brakeman, MD, FAAFP [facility] Pager [phone]  If 7PM-7AM, please contact night-coverage [email or web address] Password [email or web address] 07/29/2017, 9:16 AM    LOS: 1 day

## 2017-07-29 NOTE — Progress Notes (Signed)
ANTICOAGULATION CONSULT NOTE - Follow Up Consult  Pharmacy Consult for Lovenox Indication: atrial fibrillation  No Known Allergies  Patient Measurements: Height: 6' (182.9 cm) Weight: 187 lb 2.7 oz (84.9 kg) IBW/kg (Calculated) : 77.6  Vital Signs: Temp: 97.9 F (36.6 C) (11/08 0043) Temp Source: Oral (11/08 0043) BP: 156/89 (11/08 0043) Pulse Rate: 110 (11/08 0043)  Labs: Recent Labs    07/28/17 1658 07/28/17 2344 07/29/17 0416  HGB 10.8*  --  9.7*  HCT 36.3*  --  31.5*  PLT 234  --  225  CREATININE 2.53* 2.67* 2.68*    Estimated Creatinine Clearance: 25.7 mL/min (A) (by C-G formula based on SCr of 2.68 mg/dL (H)).   Medications:  Medications Prior to Admission  Medication Sig Dispense Refill Last Dose  . acetaminophen (TYLENOL) 500 MG tablet Take 500 mg by mouth every 6 (six) hours as needed.   07/28/2017 at Unknown time  . allopurinol (ZYLOPRIM) 100 MG tablet Take 200 mg by mouth daily.    07/28/2017 at Unknown time  . apixaban (ELIQUIS) 5 MG TABS tablet Take 1 tablet (5 mg total) by mouth 2 (two) times daily. 180 tablet 3 07/28/2017 at Unknown time  . aspirin 81 MG tablet Take 81 mg by mouth daily.   07/28/2017 at Unknown time  . Cholecalciferol (VITAMIN D-3) 1000 units CAPS Take 1 capsule by mouth daily.   07/28/2017 at Unknown time  . feeding supplement, ENSURE ENLIVE, (ENSURE ENLIVE) LIQD Take 237 mLs by mouth 2 (two) times daily between meals. 90 Bottle 0 Past Month at Unknown time  . furosemide (LASIX) 40 MG tablet Take 1 tablet (40 mg total) by mouth daily as needed for fluid or edema (or more than 2-lbs of weight gain). (Patient taking differently: Take 60 mg daily by mouth. ) 225 tablet 2 07/28/2017 at Unknown time  . insulin detemir (LEVEMIR) 100 UNIT/ML injection Inject 0.2 mLs (20 Units total) into the skin at bedtime. 10 mL 11 07/27/2017 at Unknown time  . loperamide (IMODIUM) 2 MG capsule Take 1 capsule (2 mg total) by mouth 3 (three) times daily before meals.  90 capsule 0 07/28/2017 at Unknown time  . losartan (COZAAR) 100 MG tablet Take 100 mg by mouth daily.   07/28/2017 at Unknown time  . Melatonin 10 MG CAPS Take 10 mg by mouth at bedtime.   07/27/2017 at Unknown time  . OVER THE COUNTER MEDICATION OTC Sleepinal - patient takes 1 at bedtime.   07/27/2017 at Unknown time  . pantoprazole (PROTONIX) 40 MG tablet Take 1 tablet (40 mg total) by mouth daily before breakfast. 30 tablet 5 07/28/2017 at Unknown time  . pravastatin (PRAVACHOL) 40 MG tablet Take 40 mg by mouth daily.   07/27/2017 at Unknown time  . Probiotic Product (PROBIOTIC DAILY PO) Take 1 tablet daily by mouth.    07/28/2017 at Unknown time  . prochlorperazine (COMPAZINE) 10 MG tablet Take 10 mg every 6 (six) hours as needed by mouth for nausea or vomiting.    07/28/2017 at Unknown time  . Psyllium (EQ DAILY FIBER PO) Take 1 capsule every morning by mouth. Per Patient's daughter states that he takes 1 a every morning.    07/28/2017 at Unknown time  . sodium bicarbonate 650 MG tablet Take 650 mg by mouth 3 (three) times daily.    07/28/2017 at Unknown time  . vitamin B-12 (CYANOCOBALAMIN) 1000 MCG tablet Take 1 tablet (1,000 mcg total) by mouth daily. Take 2 tabs by mouth  daily. 60 tablet 5 07/28/2017 at Unknown time    Assessment: Stephen [name] is 76 yo male with a. Fib, CKD who presented to ED with nausea/vomiting. Abdominal CT shos abdominal mass around root of the mesentery. Patient has SBO. Surgery to evaluate in AM.  Pt is on apixiban for a. Fib.  Last dose at 0700 on 11/7.  Asked to switch to lovenox for possible surgery.  With CrCl will only dose lovenox daily.    Goal of Therapy:  Monitor platelets by anticoagulation protocol: Yes   Plan:  Lovenox 1mg /kg 90mg  q24h F/U plan and transition back to eliquis as indicated Monitor for S/S of bleeding  Isac Sarna, BS Vena [place], BCPS Clinical Pharmacist Pager [phone] 07/29/2017,8:12 AM

## 2017-07-29 NOTE — Progress Notes (Signed)
GI note.  Patient is 76 year old Caucasian male who was hospitalized about 2 weeks ago for diarrhea and on workup found to have steatorrhea as well as elevated urinary 5 HIAA.  He was seen in the office last week.  Chromogranin A level was checked and was markedly elevated.  He was therefore suspected to have carcinoid.  He was scheduled for abdominopelvic CT and gallium-68 scan.  CT scan was scheduled to be performed on 07/30/2017. She will call the office yesterday afternoon the began to have nausea vomiting and epigastric pain.  He was advised to come to emergency room.  On workup he was found to have markedly dilated stomach and duodenum with a narrowing at the level of the third part of the duodenum along with mesenteric calcified mass.  Symptomatically he is feeling better with NG decompression. Abdominal exam is otherwise benign.  Assessment:  Given patient's workup there is no doubt that he has carcinoid either originating from the third part of the duodenum or mesentery.  The patient has duodenal obstruction and therefore will need gastrojejunostomy to relieve obstructive symptoms.  Based on CT findings he does not have resectable disease.  He will also have mesenteric biopsy at the time of surgery as noted by Dr. Talbert Cage. Patient has been evaluated by Dr. Johnny Bridge of cardiology service and deemed to be intermediate risk. Will start patient on long-acting octreotide once he is back to eating. He will follow-up with Dr. Talbert Cage on an outpatient basis. Patient's condition discussed with Dr. Doylene Canning and Dr. Blake Divine.

## 2017-07-29 NOTE — Consult Note (Signed)
Cardiology Consultation:   Patient ID: Stephen Stephen [name]; [id number]; 1941-02-17   Admit date: 07/28/2017 Date of Consult: 07/29/2017  Primary Care Provider: [name], MD Primary Cardiologist: Stephen Stephen [name]   Patient Profile:   Stephen Stephen [name] is a 76 y.o. male with a history of CAD status post LAD angioplasty and with known occlusion of the RCA, CKD stage III, carotid artery disease status post bilateral CEA, paroxysmal atrial fibrillation, hypertension, history of stroke, and hyperlipidemia who is being seen today for preoperative cardiac evaluation at the request of Stephen Stephen [name].  History of Present Illness:   Stephen Stephen [name] presents with a 64-month history of recurring diarrhea associated with 40 pound weight loss, recent nausea and emesis, +24-hour 5HIAA, and CT imaging revealing a calcified mesenteric mass near the SMA concerning for possible carcinoid tumor.  It is distended and compressed by the mass, and he has an NG tube in place for decompression, currently n.p.o.  He is pending evaluation from Oncology and Surgery is considering possible gastrojejunostomy.  We are consulted for preoperative cardiac evaluation.  I last saw Stephen Stephen [name] in the office back in June.  He reports no recurring angina symptoms since that time.  I reviewed interval records, in the setting of above he has had associated dehydration acute on chronic renal failure, relative hypotension, was taken off of atenolol and Cardura.  He states that he has been performing basic ADLs, goes outside some but has not been pushing himself.  He reports NYHA class II dyspnea.  No palpitations or syncope.  Last echocardiogram in December 2017 revealed LVEF 60-65% with grade 2 diastolic dysfunction and mild mitral regurgitation, moderately elevated PASP of 51 mmHg.  Carotid Dopplers in May of this year revealed 1-39% bilateral ICA stenoses.  Myoview from 2015 showed diaphragmatic attenuation versus inferior scar with LVEF 51%  and no significant ischemic territories.  Past Medical History:  Diagnosis Date  . Anemia of chronic renal failure, stage 3 (moderate) (HCC)   . Atrial fibrillation ([place])    Documented 08/2016  . B12 deficiency 08/17/2016  . Carotid artery occlusion    Right carotid endarterectomy 2001; left carotid endarterectomy in 2008  . Celiac disease   . Chronic renal disease, stage 3, moderately decreased glomerular filtration rate between 30-59 mL/min/1.73 square meter ([place]) 06/01/2012  . Coronary atherosclerosis of native coronary artery    PTCA of LAD in 1994; total obstruction of the RCA treated medically; subsequent stress nuclear study with inferior ischemia  . DDD (degenerative disc disease), lumbar    Lumbar surgery in 1984  . Diabetes mellitus type II   . Essential hypertension   . History of stroke    Right brain 2001; right PCA, MCA/PCA, left SCA infarcts 08/2017 with atrial fibrillation  . Hyperlipidemia   . Iron deficiency anemia 12/31/2016  . Peripheral vascular disease [facility])     Past Surgical History:  Procedure Laterality Date  . APPENDECTOMY  1970  . CAROTID ENDARTERECTOMY  2008   Left   . CAROTID ENDARTERECTOMY  06/15/2000   Right  . [place] SURGERY  2000, 2001   L5 HNP required 2 surgical procedures     Inpatient Medications: Scheduled Meds: . [START ON 07/30/2017] enoxaparin (LOVENOX) injection  90 mg Subcutaneous Q24H  . insulin aspart  0-20 Units Subcutaneous Q4H  . sodium chloride flush  3 mL Intravenous Q12H   Continuous Infusions: . sodium chloride 100 mL/hr at 07/29/17 0958   PRN Meds: acetaminophen **OR** acetaminophen,  hydrALAZINE, labetalol, ondansetron **OR** ondansetron (ZOFRAN) IV, phenol  Allergies:   No Known Allergies  Social History:   Social History   Socioeconomic History  . Marital status: Widowed    Spouse name: Not on file  . Number of children: Not on file  . Years of education: Not on file  . Highest education level: Not on  file  Social Needs  . Financial resource strain: Not on file  . Food insecurity - worry: Not on file  . Food insecurity - inability: Not on file  . Transportation needs - medical: Not on file  . Transportation needs - non-medical: Not on file  Occupational History  . Not on file  Tobacco Use  . Smoking status: Former Smoker    Packs/day: 3.00    Years: 10.00    Pack years: 30.00    Types: Cigarettes    Last attempt to quit: 09/21/1972    Years since quitting: 44.8  . Smokeless tobacco: Never Used  Substance and Sexual Activity  . Alcohol use: No    Alcohol/week: 0.0 oz  . Drug use: No  . Sexual activity: Yes    Partners: Male    Birth control/protection: Post-menopausal  Other Topics Concern  . Not on file  Social History Narrative  . Not on file    Family History:   The patient's family history includes Diabetes in his brother, daughter, mother, sister, and son; Heart attack in his mother; Heart disease in his brother and mother; Hyperlipidemia in his son.  ROS:  Please see the history of present illness.  Fatigue, weight loss, recurring diarrhea.  All other ROS reviewed and negative.     Physical Exam/Data:   Vitals:   07/29/17 0000 07/29/17 0043 07/29/17 0500 07/29/17 1110  BP: 139/70 (!) 156/89    Pulse: (!) 118 (!) 110    Resp: 18     Temp:  97.9 F (36.6 C)    TempSrc:  Oral    SpO2: 97%   96%  Weight:  187 lb 6.3 oz (85 kg) 187 lb 2.7 oz (84.9 kg)   Height:  6' (1.829 m)      Intake/Output Summary (Last 24 hours) at 07/29/2017 1213 Last data filed at 07/29/2017 0424 Gross per 24 hour  Intake 1306.67 ml  Output 3100 ml  Net -1793.33 ml   Filed Weights   07/29/17 0043 07/29/17 0500  Weight: 187 lb 6.3 oz (85 kg) 187 lb 2.7 oz (84.9 kg)   Body mass index is 25.38 kg/m.   Gen: Chronically ill-appearing elderly male, no distress. HEENT: Conjunctiva and lids normal, oropharynx clear. Neck: Stephen [name], no elevated JVP, bilateral carotid bruits, no  thyromegaly. Lungs: Clear to auscultation, nonlabored breathing at rest. Cardiac: Regular rate and rhythm, no S3, soft systolic murmur, no pericardial rub. Abdomen: Soft, nontender, bowel sounds pesent, no guarding or rebound. Extremities: No pitting edema, distal pulses 2+. Skin: Warm and dry. Musculoskeletal: No kyphosis. Neuropsychiatric: Alert and oriented x3, affect grossly appropriate.  EKG:  I personally reviewed the tracing from 07/29/2017 which shows sinus rhythm with frequent PACs, decreased R wave progression, probable old inferior infarct pattern.  Telemetry:  I personally reviewed telemetry which shows this rhythm and sinus tachycardia with frequent PACs, cannot exclude interval PAF.  Relevant CV Studies:  Echocardiogram 09/05/2016: Study Conclusions  - Left ventricle: The cavity size was normal. Wall thickness was   normal. Systolic function was normal. The estimated ejection   fraction was in the range  of 60% to 65%. Wall motion was normal;   there were no regional wall motion abnormalities. Features are   consistent with a pseudonormal left ventricular filling pattern,   with concomitant abnormal relaxation and increased filling   pressure (grade 2 diastolic dysfunction). - Aortic valve: Mildly calcified annulus. Moderately thickened,   moderately calcified leaflets. - Mitral valve: There was mild regurgitation. - Pulmonary arteries: Systolic pressure was moderately increased.   PA peak pressure: 51 mm Hg (S).  Laboratory Data:  Chemistry Recent Labs  Lab 07/28/17 1658 07/28/17 2344 07/29/17 0416  NA 138 139 139  K 5.5* 5.5* 4.5  CL 106 106 104  CO2 20* 21* 23  GLUCOSE 153* 163* 95  BUN 44* 47* 48*  CREATININE 2.53* 2.67* 2.68*  CALCIUM 9.1 9.0 8.6*  GFRNONAA 23* 22* 22*  GFRAA 27* 25* 25*  ANIONGAP 12 12 12     Recent Labs  Lab 07/28/17 1658  PROT 8.2*  ALBUMIN 3.7  AST 18  ALT 12*  ALKPHOS 88  BILITOT 0.5   Hematology Recent Labs  Lab  07/28/17 1658 07/29/17 0416  WBC 8.4 9.0  RBC 3.66* 3.23*  HGB 10.8* 9.7*  HCT 36.3* 31.5*  MCV 99.2 97.5  MCH 29.5 30.0  MCHC 29.8* 30.8  RDW 17.9* 17.9*  PLT 234 225    Radiology/Studies:  Ct Abdomen Pelvis Wo Contrast  Result Date: 07/28/2017 CLINICAL DATA:  Diarrhea for months. Sudden severe nausea and vomiting all day today. Abdominal pain. EXAM: CT ABDOMEN AND PELVIS WITHOUT CONTRAST TECHNIQUE: Multidetector CT imaging of the abdomen and pelvis was performed following the standard protocol without IV contrast. COMPARISON:  11/18/2006 FINDINGS: Lower chest: 4 mm subpleural nodule in the right lung base. 4 mm subpleural nodule in the left lung base. These nodules were not seen previously. Several additional smaller nodules are demonstrated. Hepatobiliary: No focal liver lesions are identified. The gallbladder is distended but there is no wall thickening or inflammatory change. No radiopaque stones are demonstrated. Pancreas: Unremarkable. No pancreatic ductal dilatation or surrounding inflammatory changes. Spleen: Normal in size without focal abnormality. Adrenals/Urinary Tract: No adrenal gland nodules. Bilateral intrarenal stones. Largest is in the left lower pole measuring 12 mm in diameter. No hydronephrosis or hydroureter. No ureteral stones are demonstrated. Bladder wall is not thickened. No bladder filling defects. Stomach/Bowel: The stomach is markedly distended, mostly with fluid. No gastric wall thickening is identified. The proximal duodenum is distended. Transition zone appears to be at the second portion of the duodenum as it crosses beneath the superior mesenteric artery. Remainder of the small bowel is decompressed. Scattered stool in the colon. No colonic distention or wall thickening. Appendix is not identified. Vascular/Lymphatic: Calcification of the abdominal aorta. No aneurysm. There is retroperitoneal and periaortic lymphadenopathy with lymph nodes measuring up to about 15  mm diameter. Some of the lymph nodes are calcified. Prominent celiac axis and mesenteric lymphadenopathy. There is a partially calcified mass centrally in the mesentery and surrounding the superior mesenteric artery. The mass measures about 3.3 x 5.5 cm in diameter. Desmoplastic response with radiating scars into the mesentery. Reproductive: Prostate gland is not enlarged. Prostate calcifications are present. Other: No free air in the abdomen. Small amount of free fluid around the liver edge. Abdominal wall musculature appears intact. Musculoskeletal: Degenerative changes in the spine. No destructive bone lesions. IMPRESSION: 1. Large partially calcified mass in the root of the mesentery with radiating scarring. Additional calcified and noncalcified lymphadenopathy in the retroperitoneum and celiac axis.  Differential diagnosis would include carcinoid tumor, sclerosing mesenteritis, or possibly lymphoma. 2. Marked distention of the stomach and proximal duodenum with transition zone at the second- third portion of the duodenum. Obstruction is likely caused by the mesenteric process described above. 3. Distended gallbladder without evidence of cholelithiasis. 4. Multiple bilateral nonobstructing intrarenal stones. 5. Small amount of free fluid around the liver edge. 6. Aortic calcifications. 7. Small scattered nodules in the lung bases, largest measuring 4 mm. No follow-up needed if patient is low-risk (and has no known or suspected primary neoplasm). Non-contrast chest CT can be considered in 12 months if patient is high-risk. This recommendation follows the consensus statement: Guidelines for Management of Incidental Pulmonary Nodules Detected on CT Images: From the Fleischner Society 2017; Radiology 2017; 284:228-243. Electronically Signed   By: Lucienne Capers M.D.   On: 07/28/2017 21:27   Dg Chest Portable 1 View  Result Date: 07/28/2017 CLINICAL DATA:  NG tube placement EXAM: PORTABLE CHEST 1 VIEW  COMPARISON:  07/09/2015 FINDINGS: Minimal bibasilar atelectasis. Cardiomediastinal silhouette within normal limits. Aortic atherosclerosis. No pneumothorax. Esophageal tube courses toward the diaphragm but the tip is poorly visible, possibly over the GE junction. Gaseous enlargement of the stomach IMPRESSION: 1. Minimal bibasilar atelectasis 2. Poor visibility of the tip of the esophageal tube, it possibly projects over the distal esophagus. The stomach appears enlarged. Electronically Signed   By: Donavan Foil M.D.   On: 07/28/2017 23:20    Assessment and Plan:   1.  Preoperative cardiac evaluation in a 76 year old male with known CAD status post previous LAD angioplasty and with known occlusion of the RCA that has been managed medically.  He has PAD and carotid artery disease status post bilateral CEAs, hypertension, type 2 diabetes mellitus, previous stroke, hyperlipidemia, and PAF.  Fortunately, he has not manifested progressive angina symptoms and in fact has been on a fairly limited medical regimen following adjustments in the last month.  ECG reviewed without acute change.  Last echocardiogram was in December 2017 with LVEF in the normal range.  Reports activities near 4 METs without angina.  2.  Paroxysmal atrial fibrillation, has been on Eliquis for stroke prophylaxis as an outpatient although now is n.p.o. with NG tube in place.  Also taken off of atenolol in October due to relative hypotension.  3.  Essential hypertension, systolics 185U-314H.  He is on no antihypertensives at this time.  4.  Bilateral carotid artery disease status post previous CEA.  Carotid Dopplers from May of this year revealed only 1-39% stenoses.  5.  Hyperlipidemia, was on Pravachol as an outpatient.  6.  CKD stage 3-4, creatinine 2.6.  I reviewed records and discussed the situation with the patient and family members present.  From a cardiac perspective he is overall intermediate risk in the perioperative  setting.  It would not be anticipated that a gastrojejunostomy would be a high risk endeavor.  He is already off Eliquis.  I will initiate low-dose IV Lopressor on a standing basis as replacement for atenolol for the time being.  Also plan to update echocardiogram to ensure stability in LVEF.  Follow-up ischemic testing is not clearly indicated at this time in the absence of progressive angina.   Signed, [name], MD  07/29/2017 12:13 PM

## 2017-07-29 NOTE — Progress Notes (Signed)
CT called to see if pt needed another CXR needed to be done since one was done at [facility] to check for NGT placement. Dr Aggie Moats paged for confirmation. Dr. Aggie Moats stated that he did want another CXR for 0800 am. Gwyndolyn Saxon in CT made aware.

## 2017-07-29 NOTE — Consult Note (Addendum)
Stephen [name]  Reason for Consult:Mesentery mass with calcification causing GOO  Referring Physician:  Dr. Mesner/ Dr. Wynetta Emery   Chief Complaint    Emesis      Stephen [name] is a 76 y.o. male.  HPI: Stephen [name] is a very pleasant 74 yo with history of Stroke s/p endarderectomy, CKD, CAD s/p stents, RCA occlusion, DM on insulin, HTN, A fib on Eliquis who presents with a 6+ month yesterday of diarrhea and weight loss over 40 lbs who has been admitted to the hospital after coming in with abdominal pain, nausea, and vomiting, and cessation of his diarrhea. He has had other prior admissions with C dif treatment, and workup with GI over the past few weeks including stool studies, 24 hr 5HIAA urine test, and has been following with Dr. Laural Golden who attempted creon supplements without any improvement in the diarrhea.  Yesterday the patient presented acutely, and underwent a noncontrasted CT a/p that revealed a calcified mass at the root of the mesentery, possible surrounding the SMA, and a dilated stomach with compression of the duodenum, causing a gastric outlet obstruction.  He was brought into the hospital, and I was called overnight and discussed the case with Dr. Dayna Barker, recommending a hospitalist admission given the medical issues and likelihood that this mass is inoperable and needs further workup.      His only prior abdominal surgical history is a appendectomy for presumed normal appendicitis in 1970s. He has had carotid endarterectomy, lumbar surgeries, and coronary stents placed.   I have discussed the patient's history and my thoughts with the patient and his daughter and pastor.    Past Medical History:  Diagnosis Date  . Anemia 06/01/2012   H&H of 10/30 in 8/13 with normal MCV   . Anemia of chronic renal failure, stage 3 (moderate) (HCC) 06/01/2012   H&H of 10/30 in 8/13 with normal MCV  . Atrial fibrillation ([place])    Documented 08/2016  . B12 deficiency  08/17/2016  . Carotid artery occlusion    Right carotid endarterectomy 2001; left carotid endarterectomy in 2008  . Celiac disease   . Chronic renal disease, stage 3, moderately decreased glomerular filtration rate between 30-59 mL/min/1.73 square meter ([place]) 06/01/2012  . Coronary atherosclerosis of native coronary artery    PTCA of LAD in 1994; total obstruction of the RCA treated medically; subsequent stress nuclear study with inferior ischemia  . DDD (degenerative disc disease), lumbar    Lumbar surgery in 1984  . Diabetes mellitus type II   . Essential hypertension, benign   . History of stroke   . Hyperlipidemia   . Iron deficiency anemia 12/31/2016  . Peripheral vascular disease ([place])   . Stroke [facility])    Right brain 2001; right PCA, MCA/PCA, left SCA infarcts 08/2017 with atrial fibrillation    Past Surgical History:  Procedure Laterality Date  . APPENDECTOMY  1970  . CAROTID ENDARTERECTOMY  2008   Left   . CAROTID ENDARTERECTOMY  06/15/2000   Right  . [place] SURGERY  2000, 2001   L5 HNP required 2 surgical procedures    Family History  Problem Relation Age of Onset  . Heart disease Mother        Before age 32  . Diabetes Mother   . Heart attack Mother   . Diabetes Sister   . Heart disease Brother   . Diabetes Brother   . Diabetes Daughter   . Diabetes Son   . Hyperlipidemia  Son     Social History   Tobacco Use  . Smoking status: Former Smoker    Packs/day: 3.00    Years: 10.00    Pack years: 30.00    Types: Cigarettes    Last attempt to quit: 09/21/1972    Years since quitting: 44.8  . Smokeless tobacco: Never Used  Substance Use Topics  . Alcohol use: No    Alcohol/week: 0.0 oz  . Drug use: No    Medications:  I have reviewed the patient's current medications. Prior to Admission:  Medications Prior to Admission  Medication Sig Dispense Refill Last Dose  . acetaminophen (TYLENOL) 500 MG tablet Take 500 mg by mouth every 6 (six) hours as  needed.   07/28/2017 at Unknown time  . allopurinol (ZYLOPRIM) 100 MG tablet Take 200 mg by mouth daily.    07/28/2017 at Unknown time  . apixaban (ELIQUIS) 5 MG TABS tablet Take 1 tablet (5 mg total) by mouth 2 (two) times daily. 180 tablet 3 07/28/2017 at Unknown time  . aspirin 81 MG tablet Take 81 mg by mouth daily.   07/28/2017 at Unknown time  . Cholecalciferol (VITAMIN D-3) 1000 units CAPS Take 1 capsule by mouth daily.   07/28/2017 at Unknown time  . feeding supplement, ENSURE ENLIVE, (ENSURE ENLIVE) LIQD Take 237 mLs by mouth 2 (two) times daily between meals. 90 Bottle 0 Past Month at Unknown time  . furosemide (LASIX) 40 MG tablet Take 1 tablet (40 mg total) by mouth daily as needed for fluid or edema (or more than 2-lbs of weight gain). (Patient taking differently: Take 60 mg daily by mouth. ) 225 tablet 2 07/28/2017 at Unknown time  . insulin detemir (LEVEMIR) 100 UNIT/ML injection Inject 0.2 mLs (20 Units total) into the skin at bedtime. 10 mL 11 07/27/2017 at Unknown time  . loperamide (IMODIUM) 2 MG capsule Take 1 capsule (2 mg total) by mouth 3 (three) times daily before meals. 90 capsule 0 07/28/2017 at Unknown time  . losartan (COZAAR) 100 MG tablet Take 100 mg by mouth daily.   07/28/2017 at Unknown time  . Melatonin 10 MG CAPS Take 10 mg by mouth at bedtime.   07/27/2017 at Unknown time  . OVER THE COUNTER MEDICATION OTC Sleepinal - patient takes 1 at bedtime.   07/27/2017 at Unknown time  . pantoprazole (PROTONIX) 40 MG tablet Take 1 tablet (40 mg total) by mouth daily before breakfast. 30 tablet 5 07/28/2017 at Unknown time  . pravastatin (PRAVACHOL) 40 MG tablet Take 40 mg by mouth daily.   07/27/2017 at Unknown time  . Probiotic Product (PROBIOTIC DAILY PO) Take 1 tablet daily by mouth.    07/28/2017 at Unknown time  . prochlorperazine (COMPAZINE) 10 MG tablet Take 10 mg every 6 (six) hours as needed by mouth for nausea or vomiting.    07/28/2017 at Unknown time  . Psyllium (EQ DAILY FIBER  PO) Take 1 capsule every morning by mouth. Per Patient's daughter states that he takes 1 a every morning.    07/28/2017 at Unknown time  . sodium bicarbonate 650 MG tablet Take 650 mg by mouth 3 (three) times daily.    07/28/2017 at Unknown time  . vitamin B-12 (CYANOCOBALAMIN) 1000 MCG tablet Take 1 tablet (1,000 mcg total) by mouth daily. Take 2 tabs by mouth daily. 60 tablet 5 07/28/2017 at Unknown time   Scheduled: . [START ON 07/30/2017] enoxaparin (LOVENOX) injection  90 mg Subcutaneous Q24H  . insulin aspart  0-20 Units Subcutaneous Q4H  . sodium chloride flush  3 mL Intravenous Q12H   Continuous: . sodium chloride 100 mL/hr at 07/29/17 0958   No Known Allergies  ROS:  A comprehensive review of systems was negative except for: Constitutional: positive for weight loss Gastrointestinal: positive for abdominal pain, diarrhea, nausea and vomiting Endocrine: positive for diabetes on insulin  Blood pressure (!) 156/89, pulse (!) 110, temperature 97.9 F (36.6 C), temperature source Oral, resp. rate 18, height 6' (1.829 m), weight 187 lb 2.7 oz (84.9 kg), SpO2 97 %. Physical Exam  Constitutional: He is oriented to person, place, and time. Vital signs are normal. He appears malnourished. He appears cachectic.  HENT:  Head: Normocephalic.  Eyes: Pupils are equal, round, and reactive to light.  Cardiovascular: Normal rate. An irregular rhythm present.  Pulmonary/Chest: Effort normal and breath sounds normal.  Abdominal: Soft. He exhibits distension. There is no tenderness. There is no rebound and no guarding.  Musculoskeletal: Normal range of motion. He exhibits edema.  Lymphadenopathy:       Head (right side): No submandibular adenopathy present.       Head (left side): No submandibular adenopathy present.    He has no cervical adenopathy.    He has no axillary adenopathy.       Right: No inguinal and no supraclavicular adenopathy present.       Left: No inguinal and no supraclavicular  adenopathy present.  Neurological: He is alert and oriented to person, place, and time.  Skin: Skin is warm and dry.  Psychiatric: Mood, memory, affect and judgment normal.  Vitals reviewed.   Results: Results for orders placed or performed during the hospital encounter of 07/28/17 (from the past 48 hour(s))  Urinalysis, Routine w reflex microscopic     Status: Abnormal   Collection Time: 07/28/17  3:11 PM  Result Value Ref Range   Color, Urine YELLOW YELLOW   APPearance HAZY (A) CLEAR   Specific Gravity, Urine 1.017 1.005 - 1.030   pH 5.0 5.0 - 8.0   Glucose, UA NEGATIVE NEGATIVE mg/dL   Hgb urine dipstick MODERATE (A) NEGATIVE   Bilirubin Urine NEGATIVE NEGATIVE   Ketones, ur NEGATIVE NEGATIVE mg/dL   Protein, ur NEGATIVE NEGATIVE mg/dL   Nitrite NEGATIVE NEGATIVE   Leukocytes, UA SMALL (A) NEGATIVE   RBC / HPF 6-30 0 - 5 RBC/hpf   WBC, UA 6-30 0 - 5 WBC/hpf   Bacteria, UA RARE (A) NONE SEEN   Squamous Epithelial / LPF 0-5 (A) NONE SEEN   Hyaline Casts, UA PRESENT   POC CBG, ED     Status: Abnormal   Collection Time: 07/28/17  3:13 PM  Result Value Ref Range   Glucose-Capillary 144 (H) 65 - 99 mg/dL  Comprehensive metabolic panel     Status: Abnormal   Collection Time: 07/28/17  4:58 PM  Result Value Ref Range   Sodium 138 135 - 145 mmol/L   Potassium 5.5 (H) 3.5 - 5.1 mmol/L   Chloride 106 101 - 111 mmol/L   CO2 20 (L) 22 - 32 mmol/L   Glucose, Bld 153 (H) 65 - 99 mg/dL   BUN 44 (H) 6 - 20 mg/dL   Creatinine, Ser 2.53 (H) 0.61 - 1.24 mg/dL   Calcium 9.1 8.9 - 10.3 mg/dL   Total Protein 8.2 (H) 6.5 - 8.1 g/dL   Albumin 3.7 3.5 - 5.0 g/dL   AST 18 15 - 41 U/L   ALT 12 (L) 17 -  63 U/L   Alkaline Phosphatase 88 38 - 126 U/L   Total Bilirubin 0.5 0.3 - 1.2 mg/dL   GFR calc non Af Amer 23 (L) >60 mL/min   GFR calc Af Amer 27 (L) >60 mL/min    Comment: (NOTE) The eGFR has been calculated using the CKD EPI equation. This calculation has not been validated in all  clinical situations. eGFR's persistently <60 mL/min signify possible Chronic Kidney Disease.    Anion gap 12 5 - 15  CBC with Differential     Status: Abnormal   Collection Time: 07/28/17  4:58 PM  Result Value Ref Range   WBC 8.4 4.0 - 10.5 K/uL   RBC 3.66 (L) 4.22 - 5.81 MIL/uL   Hemoglobin 10.8 (L) 13.0 - 17.0 g/dL   HCT 36.3 (L) 39.0 - 52.0 %   MCV 99.2 78.0 - 100.0 fL   MCH 29.5 26.0 - 34.0 pg   MCHC 29.8 (L) 30.0 - 36.0 g/dL   RDW 17.9 (H) 11.5 - 15.5 %   Platelets 234 150 - 400 K/uL   Neutrophils Relative % 81 %   Neutro Abs 6.9 1.7 - 7.7 K/uL   Lymphocytes Relative 14 %   Lymphs Abs 1.1 0.7 - 4.0 K/uL   Monocytes Relative 5 %   Monocytes Absolute 0.4 0.1 - 1.0 K/uL   Eosinophils Relative 0 %   Eosinophils Absolute 0.0 0.0 - 0.7 K/uL   Basophils Relative 0 %   Basophils Absolute 0.0 0.0 - 0.1 K/uL  Lipase, blood     Status: None   Collection Time: 07/28/17  5:39 PM  Result Value Ref Range   Lipase 26 11 - 51 U/L  Basic metabolic panel     Status: Abnormal   Collection Time: 07/28/17 11:44 PM  Result Value Ref Range   Sodium 139 135 - 145 mmol/L   Potassium 5.5 (H) 3.5 - 5.1 mmol/L   Chloride 106 101 - 111 mmol/L   CO2 21 (L) 22 - 32 mmol/L   Glucose, Bld 163 (H) 65 - 99 mg/dL   BUN 47 (H) 6 - 20 mg/dL   Creatinine, Ser 2.67 (H) 0.61 - 1.24 mg/dL   Calcium 9.0 8.9 - 10.3 mg/dL   GFR calc non Af Amer 22 (L) >60 mL/min   GFR calc Af Amer 25 (L) >60 mL/min    Comment: (NOTE) The eGFR has been calculated using the CKD EPI equation. This calculation has not been validated in all clinical situations. eGFR's persistently <60 mL/min signify possible Chronic Kidney Disease.    Anion gap 12 5 - 15  Glucose, capillary     Status: Abnormal   Collection Time: 07/29/17 12:43 AM  Result Value Ref Range   Glucose-Capillary 129 (H) 65 - 99 mg/dL   Comment 1 Notify RN    Comment 2 Document in Chart   Basic metabolic panel     Status: Abnormal   Collection Time: 07/29/17   4:16 AM  Result Value Ref Range   Sodium 139 135 - 145 mmol/L   Potassium 4.5 3.5 - 5.1 mmol/L    Comment: DELTA CHECK NOTED   Chloride 104 101 - 111 mmol/L   CO2 23 22 - 32 mmol/L   Glucose, Bld 95 65 - 99 mg/dL   BUN 48 (H) 6 - 20 mg/dL   Creatinine, Ser 2.68 (H) 0.61 - 1.24 mg/dL   Calcium 8.6 (L) 8.9 - 10.3 mg/dL   GFR calc non Af Wyvonnia Lora  22 (L) >60 mL/min   GFR calc Af Amer 25 (L) >60 mL/min    Comment: (NOTE) The eGFR has been calculated using the CKD EPI equation. This calculation has not been validated in all clinical situations. eGFR's persistently <60 mL/min signify possible Chronic Kidney Disease.    Anion gap 12 5 - 15  CBC     Status: Abnormal   Collection Time: 07/29/17  4:16 AM  Result Value Ref Range   WBC 9.0 4.0 - 10.5 K/uL   RBC 3.23 (L) 4.22 - 5.81 MIL/uL   Hemoglobin 9.7 (L) 13.0 - 17.0 g/dL   HCT 31.5 (L) 39.0 - 52.0 %   MCV 97.5 78.0 - 100.0 fL   MCH 30.0 26.0 - 34.0 pg   MCHC 30.8 30.0 - 36.0 g/dL   RDW 17.9 (H) 11.5 - 15.5 %   Platelets 225 150 - 400 K/uL  Glucose, capillary     Status: None   Collection Time: 07/29/17  4:22 AM  Result Value Ref Range   Glucose-Capillary 92 65 - 99 mg/dL  Glucose, capillary     Status: Abnormal   Collection Time: 07/29/17  7:52 AM  Result Value Ref Range   Glucose-Capillary 105 (H) 65 - 99 mg/dL    24 hr 5HIAA quant- 47.9 ++ 10/17  Fecal fat - WNL 10/16 C dif negative 10/15  Personally reviewed CT scan- calcified mass close to the root of the SMA but not at the origin with stellate appearance, difficult to appreciate plain but does not appear to be related to the pancreas, gastric distention from compression of mass on duodenum, decompressed small bowel, adenopathy, lung nodules scattered   Ct Abdomen Pelvis Wo Contrast  Result Date: 07/28/2017 CLINICAL DATA:  Diarrhea for months. Sudden severe nausea and vomiting all day today. Abdominal pain. EXAM: CT ABDOMEN AND PELVIS WITHOUT CONTRAST TECHNIQUE:  Multidetector CT imaging of the abdomen and pelvis was performed following the standard protocol without IV contrast. COMPARISON:  11/18/2006 FINDINGS: Lower chest: 4 mm subpleural nodule in the right lung base. 4 mm subpleural nodule in the left lung base. These nodules were not seen previously. Several additional smaller nodules are demonstrated. Hepatobiliary: No focal liver lesions are identified. The gallbladder is distended but there is no wall thickening or inflammatory change. No radiopaque stones are demonstrated. Pancreas: Unremarkable. No pancreatic ductal dilatation or surrounding inflammatory changes. Spleen: Normal in size without focal abnormality. Adrenals/Urinary Tract: No adrenal gland nodules. Bilateral intrarenal stones. Largest is in the left lower pole measuring 12 mm in diameter. No hydronephrosis or hydroureter. No ureteral stones are demonstrated. Bladder wall is not thickened. No bladder filling defects. Stomach/Bowel: The stomach is markedly distended, mostly with fluid. No gastric wall thickening is identified. The proximal duodenum is distended. Transition zone appears to be at the second portion of the duodenum as it crosses beneath the superior mesenteric artery. Remainder of the small bowel is decompressed. Scattered stool in the colon. No colonic distention or wall thickening. Appendix is not identified. Vascular/Lymphatic: Calcification of the abdominal aorta. No aneurysm. There is retroperitoneal and periaortic lymphadenopathy with lymph nodes measuring up to about 15 mm diameter. Some of the lymph nodes are calcified. Prominent celiac axis and mesenteric lymphadenopathy. There is a partially calcified mass centrally in the mesentery and surrounding the superior mesenteric artery. The mass measures about 3.3 x 5.5 cm in diameter. Desmoplastic response with radiating scars into the mesentery. Reproductive: Prostate gland is not enlarged. Prostate calcifications are present. Other:  No free air in the abdomen. Small amount of free fluid around the liver edge. Abdominal wall musculature appears intact. Musculoskeletal: Degenerative changes in the spine. No destructive bone lesions. IMPRESSION: 1. Large partially calcified mass in the root of the mesentery with radiating scarring. Additional calcified and noncalcified lymphadenopathy in the retroperitoneum and celiac axis. Differential diagnosis would include carcinoid tumor, sclerosing mesenteritis, or possibly lymphoma. 2. Marked distention of the stomach and proximal duodenum with transition zone at the second- third portion of the duodenum. Obstruction is likely caused by the mesenteric process described above. 3. Distended gallbladder without evidence of cholelithiasis. 4. Multiple bilateral nonobstructing intrarenal stones. 5. Small amount of free fluid around the liver edge. 6. Aortic calcifications. 7. Small scattered nodules in the lung bases, largest measuring 4 mm. No follow-up needed if patient is low-risk (and has no known or suspected primary neoplasm). Non-contrast chest CT can be considered in 12 months if patient is high-risk. This recommendation follows the consensus statement: Guidelines for Management of Incidental Pulmonary Nodules Detected on CT Images: From the Fleischner Society 2017; Radiology 2017; 284:228-243. Electronically Signed   By: Lucienne Capers M.D.   On: 07/28/2017 21:27   Dg Chest Portable 1 View  Result Date: 07/28/2017 CLINICAL DATA:  NG tube placement EXAM: PORTABLE CHEST 1 VIEW COMPARISON:  07/09/2015 FINDINGS: Minimal bibasilar atelectasis. Cardiomediastinal silhouette within normal limits. Aortic atherosclerosis. No pneumothorax. Esophageal tube courses toward the diaphragm but the tip is poorly visible, possibly over the GE junction. Gaseous enlargement of the stomach IMPRESSION: 1. Minimal bibasilar atelectasis 2. Poor visibility of the tip of the esophageal tube, it possibly projects over the  distal esophagus. The stomach appears enlarged. Electronically Signed   By: Donavan Foil M.D.   On: 07/28/2017 23:20     Assessment & Plan:  Stephen [name] is a 76 y.o. male with a calcified 3X5cm mass in his mesentery close to the SMA, having the appearance of possible carcinoid tumor and a 5HIAA urine study that was elevated about 1 month ago. The patient also has had symptoms of diarrhea for some time.  Given these findings, I discussed with the family and patient my suspicion that this could be a carcinoid tumor and that given the location surgical removal would not likely be possible.  Given the GOO caused by the tumor a Gastrojejunostomy to allow for decompression of the stomach and for comfort is likely needed.  I discussed with them several issues need to be addressed prior to any bypass with gastrojejunostomy.  1.) Confirmation of carcinoid tumor   Would get Oncology involved and see their thoughts too, if they would treat based on symptoms and 5HIAA, versus  needing further workup with possible Octreotide scan, etc   Could possibly order other tests, Chromogranin, versus urine serotonin given that this is possibly more foregut   Biopsy of the lesion would be difficult given the location and vascularity but can be discussed if needed  2.) Cardiology risk stratification given the history of CAD, Afib/ Carotid disease with Stroke, occlusion of RCA  May not be able to perform surgery at [facility] if high risk given anesthesia capabilities, discussed this with the family 3.) Once Cardiology weighs in and Oncology is comfortable with diagnosis, would plan for gastrojejunostomy to help decompress stomach and remove NG tube  4.) Keep NPO, NG tube for now, may consider TPN given weight loss and possible extended period without food, would not anticipate any surgery before next week given needed  evaluations    All questions were answered to the satisfaction of the patient and family. Information  about carcinoid tumor and syndrome given to the family but discussed we do not have a definitive diagnosis at this time but this is a possibility.   Updated Dr. Wynetta Emery.    Virl Cagey 07/29/2017, 10:10 AM

## 2017-07-30 ENCOUNTER — Ambulatory Visit (HOSPITAL_COMMUNITY): Payer: PPO

## 2017-07-30 ENCOUNTER — Encounter (HOSPITAL_COMMUNITY): Payer: PPO

## 2017-07-30 ENCOUNTER — Inpatient Hospital Stay (HOSPITAL_COMMUNITY): Payer: PPO

## 2017-07-30 ENCOUNTER — Ambulatory Visit: Payer: PPO | Admitting: Family

## 2017-07-30 DIAGNOSIS — I340 Nonrheumatic mitral (valve) insufficiency: Secondary | ICD-10-CM

## 2017-07-30 LAB — ECHOCARDIOGRAM COMPLETE
HEIGHTINCHES: 72 in
Weight: 2994.73 oz

## 2017-07-30 LAB — GLUCOSE, CAPILLARY
GLUCOSE-CAPILLARY: 105 mg/dL — AB (ref 65–99)
GLUCOSE-CAPILLARY: 90 mg/dL (ref 65–99)
GLUCOSE-CAPILLARY: 98 mg/dL (ref 65–99)
Glucose-Capillary: 102 mg/dL — ABNORMAL HIGH (ref 65–99)

## 2017-07-30 LAB — PROTIME-INR
INR: 1.35
PROTHROMBIN TIME: 16.6 s — AB (ref 11.4–15.2)

## 2017-07-30 MED ORDER — CHLORHEXIDINE GLUCONATE CLOTH 2 % EX PADS
6.0000 | MEDICATED_PAD | Freq: Once | CUTANEOUS | Status: AC
  Administered 2017-07-30: 6 via TOPICAL

## 2017-07-30 MED ORDER — CHLORHEXIDINE GLUCONATE CLOTH 2 % EX PADS: 6.0000 | MEDICATED_PAD | Freq: Once | CUTANEOUS | Status: DC

## 2017-07-30 MED ORDER — CEFAZOLIN SODIUM-DEXTROSE 2-4 GM/100ML-% IV SOLN: 2.0000 g | INTRAVENOUS | Status: AC

## 2017-07-30 NOTE — Progress Notes (Signed)
Patient NG tube reportedly came out during self care. This nurse attempted X1 in the right nare, patient states Shortness of breath, tube removed immediately, noted bleeding to bilateral nares. MD notified.

## 2017-07-30 NOTE — Progress Notes (Signed)
[facility]  Spoke with patient and family extensively about plan for gastrojejunostomy and biopsy of the mass.  INR orderd. Cardiology reports intermediate risk.   Can hold on NG for now without any nausea/ vomiting, but if has nausea/vomiting needs NG back. Will have to put it back in on Sunday to help facilitate decompression of the stomach prior to surgery and intubation.  No ice or sips without NG tube..  Will type and cross for blood on Sunday.   Curlene Labrum, MD [facility] [address] [place], [place] [address] [phone] (office)

## 2017-07-30 NOTE — Progress Notes (Signed)
PROGRESS NOTE  Stephen [name]  SWF:[id number]  DOB: 1941/03/25  DOA: 07/28/2017 PCP: [name], MD   Brief Admission Hx: Stephen [name] is a 76 y.o. male with past medical history significant for atrial fibrillation, stroke, CKD 3 and anemia presents the emergency room with chief complaint of nausea vomiting.  CT of the abdomen and pelvis showed abdominal mass around the root of the mesentery.  MDM/Assessment & Plan:   1. Mesenteric mass with bowel obstruction - Pt feels much better with NGT decompression.  Continue IVFs.  General surgery has been consulted.  Dr. Constance Haw is planning to take to OR on Monday 08/02/17 for gastrojejunostomy.  Eliquis discontinued.  Dr. Talbert Cage with oncology consulted and planning to set patient up for PET scan and further outpatient follow up for biopsy and further management.  GI planning to start long-acting octreotide.   2. Acute on chronic CKD stage 3 - continue IVF hydration and monitor labs. Creatinine holding stable.  3. Hyperkalemia - resolved now.   4. Essential Hypertension - holding home BP meds for now.  IV hydralazine as needed.  5. Chronic Atrial Fibrillation - monitor rate, anticoagulation as above, follow on telemetry 6. Chronic diastolic CHF - stable,  Holding home cozaar and lasix.  7. Type 2 Diabetes mellitus - supplemental sliding scale insulin ordered.  8. Dyslipidemia - holding statin for now.  9. Gout - holding home allopurinol.  10. Diarrhea - likely secondary to #1.  Holding home lomotil secondary to bowel obstruction.   DVT prophylaxis: lovenox Code Status: DNR Family Communication: daughter Disposition Plan: TBD   Consultants:  General surgery  Subjective: Pt says that he is feeling a lot better but throat is sore from NG tube.   Objective: Vitals:   07/29/17 1806 07/29/17 2049 07/29/17 2227 07/30/17 0540  BP: (!) 157/74  (!) 142/68 (!) 123/59  Pulse: 89  81 92  Resp:   20 18  Temp:   98.2 F (36.8 C) 98.5 F (36.9  C)  TempSrc:   Oral Oral  SpO2:  94% 95% 99%  Weight:      Height:        Intake/Output Summary (Last 24 hours) at 07/30/2017 0929 Last data filed at 07/30/2017 0546 Gross per 24 hour  Intake 2536.67 ml  Output 1775 ml  Net 761.67 ml   Filed Weights   07/29/17 0043 07/29/17 0500  Weight: 85 kg (187 lb 6.3 oz) 84.9 kg (187 lb 2.7 oz)    REVIEW OF SYSTEMS  As per history otherwise all reviewed and reported negative  Exam:  General exam: thin male, awake, alert, NG in place.  NAD.  Respiratory system: Clear. No increased work of breathing. Cardiovascular system: normal S1 & S2 heard. No JVD.  Gastrointestinal system: Abdomen is nondistended, soft and nontender. Normal bowel sounds heard. Central nervous system: Alert and oriented. No focal neurological deficits. Extremities: no CCE.  Data Reviewed: Basic Metabolic Panel: Recent Labs  Lab 07/28/17 1658 07/28/17 2344 07/29/17 0416  NA 138 139 139  K 5.5* 5.5* 4.5  CL 106 106 104  CO2 20* 21* 23  GLUCOSE 153* 163* 95  BUN 44* 47* 48*  CREATININE 2.53* 2.67* 2.68*  CALCIUM 9.1 9.0 8.6*   Liver Function Tests: Recent Labs  Lab 07/28/17 1658  AST 18  ALT 12*  ALKPHOS 88  BILITOT 0.5  PROT 8.2*  ALBUMIN 3.7   Recent Labs  Lab 07/28/17 1739  LIPASE 26   No results  for input(s): AMMONIA in the last 168 hours. CBC: Recent Labs  Lab 07/28/17 1658 07/29/17 0416  WBC 8.4 9.0  NEUTROABS 6.9  --   HGB 10.8* 9.7*  HCT 36.3* 31.5*  MCV 99.2 97.5  PLT 234 225   Cardiac Enzymes: No results for input(s): CKTOTAL, CKMB, CKMBINDEX, TROPONINI in the last 168 hours. CBG (last 3)  Recent Labs    07/29/17 2351 07/30/17 0437 07/30/17 0800  GLUCAP 101* 105* 90   No results found for this or any previous visit (from the past 240 hour(s)).   Studies: Ct Abdomen Pelvis Wo Contrast  Result Date: 07/28/2017 CLINICAL DATA:  Diarrhea for months. Sudden severe nausea and vomiting all day today. Abdominal pain.  EXAM: CT ABDOMEN AND PELVIS WITHOUT CONTRAST TECHNIQUE: Multidetector CT imaging of the abdomen and pelvis was performed following the standard protocol without IV contrast. COMPARISON:  11/18/2006 FINDINGS: Lower chest: 4 mm subpleural nodule in the right lung base. 4 mm subpleural nodule in the left lung base. These nodules were not seen previously. Several additional smaller nodules are demonstrated. Hepatobiliary: No focal liver lesions are identified. The gallbladder is distended but there is no wall thickening or inflammatory change. No radiopaque stones are demonstrated. Pancreas: Unremarkable. No pancreatic ductal dilatation or surrounding inflammatory changes. Spleen: Normal in size without focal abnormality. Adrenals/Urinary Tract: No adrenal gland nodules. Bilateral intrarenal stones. Largest is in the left lower pole measuring 12 mm in diameter. No hydronephrosis or hydroureter. No ureteral stones are demonstrated. Bladder wall is not thickened. No bladder filling defects. Stomach/Bowel: The stomach is markedly distended, mostly with fluid. No gastric wall thickening is identified. The proximal duodenum is distended. Transition zone appears to be at the second portion of the duodenum as it crosses beneath the superior mesenteric artery. Remainder of the small bowel is decompressed. Scattered stool in the colon. No colonic distention or wall thickening. Appendix is not identified. Vascular/Lymphatic: Calcification of the abdominal aorta. No aneurysm. There is retroperitoneal and periaortic lymphadenopathy with lymph nodes measuring up to about 15 mm diameter. Some of the lymph nodes are calcified. Prominent celiac axis and mesenteric lymphadenopathy. There is a partially calcified mass centrally in the mesentery and surrounding the superior mesenteric artery. The mass measures about 3.3 x 5.5 cm in diameter. Desmoplastic response with radiating scars into the mesentery. Reproductive: Prostate gland is not  enlarged. Prostate calcifications are present. Other: No free air in the abdomen. Small amount of free fluid around the liver edge. Abdominal wall musculature appears intact. Musculoskeletal: Degenerative changes in the spine. No destructive bone lesions. IMPRESSION: 1. Large partially calcified mass in the root of the mesentery with radiating scarring. Additional calcified and noncalcified lymphadenopathy in the retroperitoneum and celiac axis. Differential diagnosis would include carcinoid tumor, sclerosing mesenteritis, or possibly lymphoma. 2. Marked distention of the stomach and proximal duodenum with transition zone at the second- third portion of the duodenum. Obstruction is likely caused by the mesenteric process described above. 3. Distended gallbladder without evidence of cholelithiasis. 4. Multiple bilateral nonobstructing intrarenal stones. 5. Small amount of free fluid around the liver edge. 6. Aortic calcifications. 7. Small scattered nodules in the lung bases, largest measuring 4 mm. No follow-up needed if patient is low-risk (and has no known or suspected primary neoplasm). Non-contrast chest CT can be considered in 12 months if patient is high-risk. This recommendation follows the consensus statement: Guidelines for Management of Incidental Pulmonary Nodules Detected on CT Images: From the Fleischner Society 2017; Radiology 2017; 284:228-243.  Electronically Signed   By: Lucienne Capers M.D.   On: 07/28/2017 21:27   Dg Chest Portable 1 View  Result Date: 07/28/2017 CLINICAL DATA:  NG tube placement EXAM: PORTABLE CHEST 1 VIEW COMPARISON:  07/09/2015 FINDINGS: Minimal bibasilar atelectasis. Cardiomediastinal silhouette within normal limits. Aortic atherosclerosis. No pneumothorax. Esophageal tube courses toward the diaphragm but the tip is poorly visible, possibly over the GE junction. Gaseous enlargement of the stomach IMPRESSION: 1. Minimal bibasilar atelectasis 2. Poor visibility of the tip of  the esophageal tube, it possibly projects over the distal esophagus. The stomach appears enlarged. Electronically Signed   By: Donavan Foil M.D.   On: 07/28/2017 23:20   Scheduled Meds: . Chlorhexidine Gluconate Cloth  6 each Topical Once   And  . Chlorhexidine Gluconate Cloth  6 each Topical Once  . enoxaparin (LOVENOX) injection  90 mg Subcutaneous Q24H  . insulin aspart  0-20 Units Subcutaneous Q4H  . metoprolol tartrate  2.5 mg Intravenous Q6H  . sodium chloride flush  3 mL Intravenous Q12H   Continuous Infusions: . sodium chloride 100 mL/hr (07/30/17 0546)  .  ceFAZolin (ANCEF) IV      Principal Problem:   Bowel obstruction (HCC) Active Problems:   Essential hypertension   Hyperlipidemia   Coronary artery disease involving native coronary artery of native heart with angina pectoris (HCC)   CKD (chronic kidney disease) stage 3, GFR 30-59 ml/min (HCC)   PAF (paroxysmal atrial fibrillation) (HCC)   Intractable nausea and vomiting   Acute on chronic renal failure (HCC)   Gastric outlet obstruction   Neoplasm of uncertain behavior of mesentery   Preoperative cardiovascular examination  Time spent:   Irwin Brakeman, MD, FAAFP [facility] Pager [phone]  If 7PM-7AM, please contact night-coverage [email or web address] Password [email or web address] 07/30/2017, 9:29 AM    LOS: 2 days

## 2017-07-30 NOTE — Progress Notes (Signed)
*  PRELIMINARY RESULTS* Echocardiogram 2D Echocardiogram has been performed.  Leavy Cella 07/30/2017, 2:17 PM

## 2017-07-30 NOTE — Progress Notes (Signed)
Progress Note  Patient Name: Stephen [name] [facility] Date of Encounter: 07/30/2017  Primary Cardiologist: Dr. Satira Sark  Subjective   No chest pain or palpitations.  NG tube came out accidentally and is being replaced.  No nausea or emesis.  Inpatient Medications    Scheduled Meds: . Chlorhexidine Gluconate Cloth  6 each Topical Once   And  . Chlorhexidine Gluconate Cloth  6 each Topical Once  . enoxaparin (LOVENOX) injection  90 mg Subcutaneous Q24H  . insulin aspart  0-20 Units Subcutaneous Q4H  . metoprolol tartrate  2.5 mg Intravenous Q6H  . sodium chloride flush  3 mL Intravenous Q12H   Continuous Infusions: . sodium chloride 100 mL/hr (07/30/17 0546)  .  ceFAZolin (ANCEF) IV     PRN Meds: acetaminophen **OR** acetaminophen, hydrALAZINE, labetalol, ondansetron **OR** ondansetron (ZOFRAN) IV, phenol   Vital Signs    Vitals:   07/29/17 1806 07/29/17 2049 07/29/17 2227 07/30/17 0540  BP: (!) 157/74  (!) 142/68 (!) 123/59  Pulse: 89  81 92  Resp:   20 18  Temp:   98.2 F (36.8 C) 98.5 F (36.9 C)  TempSrc:   Oral Oral  SpO2:  94% 95% 99%  Weight:      Height:        Intake/Output Summary (Last 24 hours) at 07/30/2017 1022 Last data filed at 07/30/2017 0546 Gross per 24 hour  Intake 2536.67 ml  Output 1775 ml  Net 761.67 ml   Filed Weights   07/29/17 0043 07/29/17 0500  Weight: 187 lb 6.3 oz (85 kg) 187 lb 2.7 oz (84.9 kg)    Telemetry    Sinus rhythm with frequent PACs.  Personally reviewed.  Physical Exam   GEN:  Chronically ill-appearing elderly male.  No acute distress.   Neck: No JVD.  Bilateral carotid bruits. Cardiac: RRR with frequent ectopy, soft systolic murmur, no gallop.  Respiratory: Nonlabored. Clear to auscultation bilaterally. GI: Soft, nontender, bowel sounds present. MS: No edema; No deformity.  Labs    Chemistry Recent Labs  Lab 07/28/17 1658 07/28/17 2344 07/29/17 0416  NA 138 139 139  K 5.5* 5.5* 4.5  CL 106 106 104   CO2 20* 21* 23  GLUCOSE 153* 163* 95  BUN 44* 47* 48*  CREATININE 2.53* 2.67* 2.68*  CALCIUM 9.1 9.0 8.6*  PROT 8.2*  --   --   ALBUMIN 3.7  --   --   AST 18  --   --   ALT 12*  --   --   ALKPHOS 88  --   --   BILITOT 0.5  --   --   GFRNONAA 23* 22* 22*  GFRAA 27* 25* 25*  ANIONGAP 12 12 12      Hematology Recent Labs  Lab 07/28/17 1658 07/29/17 0416  WBC 8.4 9.0  RBC 3.66* 3.23*  HGB 10.8* 9.7*  HCT 36.3* 31.5*  MCV 99.2 97.5  MCH 29.5 30.0  MCHC 29.8* 30.8  RDW 17.9* 17.9*  PLT 234 225    Radiology    Ct Abdomen Pelvis Wo Contrast  Result Date: 07/28/2017 CLINICAL DATA:  Diarrhea for months. Sudden severe nausea and vomiting all day today. Abdominal pain. EXAM: CT ABDOMEN AND PELVIS WITHOUT CONTRAST TECHNIQUE: Multidetector CT imaging of the abdomen and pelvis was performed following the standard protocol without IV contrast. COMPARISON:  11/18/2006 FINDINGS: Lower chest: 4 mm subpleural nodule in the right lung base. 4 mm subpleural nodule in the left lung  base. These nodules were not seen previously. Several additional smaller nodules are demonstrated. Hepatobiliary: No focal liver lesions are identified. The gallbladder is distended but there is no wall thickening or inflammatory change. No radiopaque stones are demonstrated. Pancreas: Unremarkable. No pancreatic ductal dilatation or surrounding inflammatory changes. Spleen: Normal in size without focal abnormality. Adrenals/Urinary Tract: No adrenal gland nodules. Bilateral intrarenal stones. Largest is in the left lower pole measuring 12 mm in diameter. No hydronephrosis or hydroureter. No ureteral stones are demonstrated. Bladder wall is not thickened. No bladder filling defects. Stomach/Bowel: The stomach is markedly distended, mostly with fluid. No gastric wall thickening is identified. The proximal duodenum is distended. Transition zone appears to be at the second portion of the duodenum as it crosses beneath the  superior mesenteric artery. Remainder of the small bowel is decompressed. Scattered stool in the colon. No colonic distention or wall thickening. Appendix is not identified. Vascular/Lymphatic: Calcification of the abdominal aorta. No aneurysm. There is retroperitoneal and periaortic lymphadenopathy with lymph nodes measuring up to about 15 mm diameter. Some of the lymph nodes are calcified. Prominent celiac axis and mesenteric lymphadenopathy. There is a partially calcified mass centrally in the mesentery and surrounding the superior mesenteric artery. The mass measures about 3.3 x 5.5 cm in diameter. Desmoplastic response with radiating scars into the mesentery. Reproductive: Prostate gland is not enlarged. Prostate calcifications are present. Other: No free air in the abdomen. Small amount of free fluid around the liver edge. Abdominal wall musculature appears intact. Musculoskeletal: Degenerative changes in the spine. No destructive bone lesions. IMPRESSION: 1. Large partially calcified mass in the root of the mesentery with radiating scarring. Additional calcified and noncalcified lymphadenopathy in the retroperitoneum and celiac axis. Differential diagnosis would include carcinoid tumor, sclerosing mesenteritis, or possibly lymphoma. 2. Marked distention of the stomach and proximal duodenum with transition zone at the second- third portion of the duodenum. Obstruction is likely caused by the mesenteric process described above. 3. Distended gallbladder without evidence of cholelithiasis. 4. Multiple bilateral nonobstructing intrarenal stones. 5. Small amount of free fluid around the liver edge. 6. Aortic calcifications. 7. Small scattered nodules in the lung bases, largest measuring 4 mm. No follow-up needed if patient is low-risk (and has no known or suspected primary neoplasm). Non-contrast chest CT can be considered in 12 months if patient is high-risk. This recommendation follows the consensus statement:  Guidelines for Management of Incidental Pulmonary Nodules Detected on CT Images: From the Fleischner Society 2017; Radiology 2017; 284:228-243. Electronically Signed   By: Lucienne Capers M.D.   On: 07/28/2017 21:27   Dg Chest Portable 1 View  Result Date: 07/28/2017 CLINICAL DATA:  NG tube placement EXAM: PORTABLE CHEST 1 VIEW COMPARISON:  07/09/2015 FINDINGS: Minimal bibasilar atelectasis. Cardiomediastinal silhouette within normal limits. Aortic atherosclerosis. No pneumothorax. Esophageal tube courses toward the diaphragm but the tip is poorly visible, possibly over the GE junction. Gaseous enlargement of the stomach IMPRESSION: 1. Minimal bibasilar atelectasis 2. Poor visibility of the tip of the esophageal tube, it possibly projects over the distal esophagus. The stomach appears enlarged. Electronically Signed   By: Donavan Foil M.D.   On: 07/28/2017 23:20    Cardiac Studies   Echocardiogram 09/05/2016: Study Conclusions  - Left ventricle: The cavity size was normal. Wall thickness was   normal. Systolic function was normal. The estimated ejection   fraction was in the range of 60% to 65%. Wall motion was normal;   there were no regional wall motion abnormalities. Features  are   consistent with a pseudonormal left ventricular filling pattern,   with concomitant abnormal relaxation and increased filling   pressure (grade 2 diastolic dysfunction). - Aortic valve: Mildly calcified annulus. Moderately thickened,   moderately calcified leaflets. - Mitral valve: There was mild regurgitation. - Pulmonary arteries: Systolic pressure was moderately increased.   PA peak pressure: 51 mm Hg (S).  Patient Profile     76 y.o. male with a history of CAD status post LAD angioplasty and with known occlusion of the RCA, CKD stage III, carotid artery disease status post bilateral CEA, paroxysmal atrial fibrillation, hypertension, history of stroke, hyperlipidemia, and a calcified mesenteric mass  near the SMA concerning for possible carcinoid tumor.  He is currently n.p.o. and requiring NG tube decompression of the stomach due to obstruction.  Plan is for gastrojejunostomy on Monday per surgery.  Assessment & Plan    1.  Preoperative cardiac evaluation.  Please see original consultation note.  No active angina symptoms on medical therapy which is limited at this time due to n.p.o. status.  Beta-blocker was resumed in the form of Lopressor 2.5 mg IV every 6 hours for now.  2.  Paroxysmal atrial fibrillation.  Eliquis has been held.  3.  Essential hypertension, not on any antihypertensive medications at this time other than IV Lopressor.  4.  Bilateral carotid artery disease status post previous CEAs.  Carotid Dopplers from May 2018 revealed 1-39% stenoses.  5.  CKD stage III-IV, creatinine 2.6.  Continue IV Lopressor, follow heart rate and rhythm on telemetry.  No further cardiac testing is planned other than a follow-up echocardiogram.  As noted previously he is overall intermediate risk from a perioperative cardiac perspective.  Signed, [name], MD  07/30/2017, 10:22 AM

## 2017-07-31 ENCOUNTER — Inpatient Hospital Stay (HOSPITAL_COMMUNITY): Payer: PPO

## 2017-07-31 ENCOUNTER — Encounter (HOSPITAL_COMMUNITY): Payer: Self-pay | Admitting: Family Medicine

## 2017-07-31 LAB — GLUCOSE, CAPILLARY
GLUCOSE-CAPILLARY: 87 mg/dL (ref 65–99)
GLUCOSE-CAPILLARY: 102 mg/dL — AB (ref 65–99)
Glucose-Capillary: 88 mg/dL (ref 65–99)
Glucose-Capillary: 85 mg/dL (ref 65–99)
Glucose-Capillary: 84 mg/dL (ref 65–99)

## 2017-07-31 MED ORDER — METHYLPREDNISOLONE SODIUM SUCC 125 MG IJ SOLR
60.0000 mg | Freq: Once | INTRAMUSCULAR | Status: AC
Start: 2017-07-31 — End: 2017-07-31
  Administered 2017-07-31: 60 mg via INTRAVENOUS
  Filled 2017-07-31: qty 2

## 2017-07-31 MED ORDER — FENTANYL CITRATE (PF) 100 MCG/2ML IJ SOLN: 12.5000 ug | INTRAMUSCULAR | Status: DC | PRN

## 2017-07-31 MED ORDER — METHYLPREDNISOLONE SODIUM SUCC 125 MG IJ SOLR
INTRAMUSCULAR | Status: AC
  Filled 2017-07-31: qty 2

## 2017-07-31 NOTE — Progress Notes (Signed)
PROGRESS NOTE  Stephen [name]  UDJ:[id number]  DOB: 04-01-1941  DOA: 07/28/2017 PCP: [name], MD  Brief Admission Hx: Stephen [name] is a 76 y.o. male with past medical history significant for atrial fibrillation, stroke, CKD 3 and anemia presents the emergency room with chief complaint of nausea vomiting.  CT of the abdomen and pelvis showed abdominal mass around the root of the mesentery.  MDM/Assessment & Plan:   1. Mesenteric mass with bowel obstruction - Pt feels much better with NGT decompression.  Continue IVFs.  General surgery has been consulted.  Dr. Constance Haw is planning to take to OR on Monday 08/02/17 for gastrojejunostomy.  Eliquis discontinued.  Dr. Talbert Cage with oncology consulted and planning to set patient up for PET scan and further outpatient follow up for biopsy and further management.  GI planning to start long-acting octreotide.   2. Acute on chronic CKD stage 3 - continue IVF hydration and monitor labs. Creatinine holding stable.  3. Hyperkalemia - resolved now.   4. Essential Hypertension - holding home BP meds for now.  IV hydralazine as needed.  5. Chronic Atrial Fibrillation - monitor rate, anticoagulation on hold for now for surgery, follow on telemetry 6. Chronic diastolic CHF - stable,  Holding home cozaar and lasix.  7. Type 2 Diabetes mellitus - supplemental sliding scale insulin ordered.  8. Dyslipidemia - holding statin for now.  9. Gout - holding home allopurinol.  10. Diarrhea - likely secondary to #1.  Holding home lomotil secondary to bowel obstruction.   DVT prophylaxis: lovenox Code Status: DNR Family Communication: daughter Disposition Plan: TBD   Consultants:  General surgery  Subjective: Pt lost NG yesterday but has been doing OK without it.  No emesis.   Objective: Vitals:   07/30/17 1032 07/30/17 2259 07/31/17 0146 07/31/17 0537  BP:  (!) 127/40 (!) 136/57 (!) 141/56  Pulse:  77 76 79  Resp:  16  16  Temp:  98.2 F (36.8 C)  98.1  F (36.7 C)  TempSrc:  Oral  Oral  SpO2: 100% 97%  98%  Weight:      Height:        Intake/Output Summary (Last 24 hours) at 07/31/2017 0905 Last data filed at 07/31/2017 0539 Gross per 24 hour  Intake 1023.33 ml  Output 775 ml  Net 248.33 ml   Filed Weights   07/29/17 0043 07/29/17 0500  Weight: 85 kg (187 lb 6.3 oz) 84.9 kg (187 lb 2.7 oz)    REVIEW OF SYSTEMS  As per history otherwise all reviewed and reported negative  Exam:  General exam: thin male, awake, alert,  NAD.  Respiratory system: Clear. No increased work of breathing. Cardiovascular system: normal S1 & S2 heard. No JVD.  Gastrointestinal system: Abdomen is nondistended, soft and nontender. Normal bowel sounds heard. Central nervous system: Alert and oriented. No focal neurological deficits. Extremities: no CCE.  Data Reviewed: Basic Metabolic Panel: Recent Labs  Lab 07/28/17 1658 07/28/17 2344 07/29/17 0416  NA 138 139 139  K 5.5* 5.5* 4.5  CL 106 106 104  CO2 20* 21* 23  GLUCOSE 153* 163* 95  BUN 44* 47* 48*  CREATININE 2.53* 2.67* 2.68*  CALCIUM 9.1 9.0 8.6*   Liver Function Tests: Recent Labs  Lab 07/28/17 1658  AST 18  ALT 12*  ALKPHOS 88  BILITOT 0.5  PROT 8.2*  ALBUMIN 3.7   Recent Labs  Lab 07/28/17 1739  LIPASE 26   No results for input(s):  AMMONIA in the last 168 hours. CBC: Recent Labs  Lab 07/28/17 1658 07/29/17 0416  WBC 8.4 9.0  NEUTROABS 6.9  --   HGB 10.8* 9.7*  HCT 36.3* 31.5*  MCV 99.2 97.5  PLT 234 225   Cardiac Enzymes: No results for input(s): CKTOTAL, CKMB, CKMBINDEX, TROPONINI in the last 168 hours. CBG (last 3)  Recent Labs    07/31/17 0148 07/31/17 0520 07/31/17 0734  GLUCAP 88 85 87   No results found for this or any previous visit (from the past 240 hour(s)).   Studies: No results found. Scheduled Meds: . Chlorhexidine Gluconate Cloth  6 each Topical Once  . insulin aspart  0-20 Units Subcutaneous Q4H  . metoprolol tartrate  2.5 mg  Intravenous Q6H  . sodium chloride flush  3 mL Intravenous Q12H   Continuous Infusions: . sodium chloride 100 mL/hr (07/30/17 0546)    Principal Problem:   Bowel obstruction (HCC) Active Problems:   Essential hypertension   Hyperlipidemia   Coronary artery disease involving native coronary artery of native heart with angina pectoris (HCC)   CKD (chronic kidney disease) stage 3, GFR 30-59 ml/min (HCC)   PAF (paroxysmal atrial fibrillation) (HCC)   Intractable nausea and vomiting   Acute on chronic renal failure (HCC)   Gastric outlet obstruction   Neoplasm of uncertain behavior of mesentery   Preoperative cardiovascular examination  Time spent:   Irwin Brakeman, MD, FAAFP [facility] Pager [phone]  If 7PM-7AM, please contact night-coverage [email or web address] Password [email or web address] 07/31/2017, 9:05 AM    LOS: 3 days

## 2017-07-31 NOTE — Progress Notes (Signed)
Patient states right knee hurts, noted fluid to right knee compared to left knee.

## 2017-08-01 LAB — BASIC METABOLIC PANEL
Anion gap: 13 (ref 5–15)
BUN: 48 mg/dL — AB (ref 6–20)
CHLORIDE: 107 mmol/L (ref 101–111)
CO2: 20 mmol/L — AB (ref 22–32)
CREATININE: 1.7 mg/dL — AB (ref 0.61–1.24)
Calcium: 9.1 mg/dL (ref 8.9–10.3)
GFR calc non Af Amer: 37 mL/min — ABNORMAL LOW (ref >60)
GFR, EST AFRICAN AMERICAN: 43 mL/min — AB (ref >60)
GLUCOSE: 118 mg/dL — AB (ref 65–99)
Potassium: 4.5 mmol/L (ref 3.5–5.1)
Sodium: 140 mmol/L (ref 135–145)

## 2017-08-01 LAB — CBC
HCT: 33.6 % — ABNORMAL LOW (ref 39.0–52.0)
Hemoglobin: 10.2 g/dL — ABNORMAL LOW (ref 13.0–17.0)
MCH: 30 pg (ref 26.0–34.0)
MCHC: 30.4 g/dL (ref 30.0–36.0)
MCV: 98.8 fL (ref 78.0–100.0)
PLATELETS: 197 10*3/uL (ref 150–400)
RBC: 3.4 MIL/uL — AB (ref 4.22–5.81)
RDW: 17.7 % — ABNORMAL HIGH (ref 11.5–15.5)
WBC: 4.6 10*3/uL (ref 4.0–10.5)

## 2017-08-01 LAB — PREPARE RBC (CROSSMATCH)

## 2017-08-01 MED ORDER — LIDOCAINE HCL 2 % EX GEL
1.0000 "application " | Freq: Once | CUTANEOUS | Status: AC
  Administered 2017-08-02: 1
  Filled 2017-08-01 (×2): qty 5

## 2017-08-01 NOTE — Progress Notes (Signed)
Patient has done well over the weekend without NG tube. He is not having abdominal pain nausea or vomiting. He is scheduled for gastrojejunostomy and biopsy of pancreatic mass in a.m.

## 2017-08-01 NOTE — Progress Notes (Signed)
[facility]  Spoke with patient again regarding surgery. Has had some BMs but still believe he needs GJ due to the mass and need for decompression of th stomach as treatment is started.   He was given prednisone for knee bursitis, will keep in mind for anesthesia/ healing.   NG to be placed tomorrow at 7AM, lidocaine gel ordered to nares at 630AM.    Want the stomach to be decompressed prior to surgery and intubation.   Discussed with RN.   Curlene Labrum, MD [facility] [address] [place], [place] [address] [phone] (office)

## 2017-08-01 NOTE — Progress Notes (Signed)
PROGRESS NOTE  Stephen [name]  WUJ:[id number]  DOB: 05/24/1941  DOA: 07/28/2017 PCP: [name], MD  Brief Admission Hx: Stephen [name] is a 76 y.o. male with past medical history significant for atrial fibrillation, stroke, CKD 3 and anemia presents the emergency room with chief complaint of nausea vomiting.  CT of the abdomen and pelvis showed abdominal mass around the root of the mesentery.  MDM/Assessment & Plan:   1. Mesenteric mass with bowel obstruction - Pt feels much better with NGT decompression.  NG came out but likely will be replaced prior to surgery.  Continue IVFs.  General surgery has been consulted.  Dr. Constance Haw is planning to take to OR on Monday 08/02/17 for gastrojejunostomy.  Eliquis discontinued.  Dr. Talbert Cage with oncology consulted and planning to set patient up for PET scan and further outpatient follow up for biopsy and further management.  GI planning to start long-acting octreotide.   2. Acute on chronic CKD stage 3 - continue IVF hydration and monitor labs. Creatinine holding stable.  3. Hyperkalemia - resolved now.   4. Essential Hypertension - holding home BP meds for now.  IV hydralazine as needed.  5. Chronic Atrial Fibrillation - monitor rate, anticoagulation on hold for now for surgery, follow on telemetry 6. Chronic diastolic CHF - stable,  Holding home cozaar and lasix.  7. Type 2 Diabetes mellitus - supplemental sliding scale insulin ordered.  8. Dyslipidemia - holding statin for now.  9. Gout - holding home allopurinol.  10. Diarrhea - likely secondary to #1.  Holding home lomotil secondary to bowel obstruction.  11. Right knee pain - resolved after IV solumedrol was given 11/10.   DVT prophylaxis: lovenox Code Status: DNR Family Communication: daughter Disposition Plan: TBD  Consultants:  General surgery  Subjective: Pt says he is ready to proceed with surgery tomorrow.  Ambulating in room.  He has had 2 BMs.   No emesis.   Objective: Vitals:     07/31/17 0537 07/31/17 2110 08/01/17 0557 08/01/17 1046  BP: (!) 141/56 (!) 144/55 (!) 144/52   Pulse: 79 80 68   Resp: 16 20 14    Temp: 98.1 F (36.7 C) 98.7 F (37.1 C) 97.8 F (36.6 C)   TempSrc: Oral Oral    SpO2: 98% 98% 98% 97%  Weight:      Height:        Intake/Output Summary (Last 24 hours) at 08/01/2017 1125 Last data filed at 08/01/2017 0800 Gross per 24 hour  Intake 0 ml  Output 500 ml  Net -500 ml   Filed Weights   07/29/17 0043 07/29/17 0500  Weight: 85 kg (187 lb 6.3 oz) 84.9 kg (187 lb 2.7 oz)    REVIEW OF SYSTEMS  As per history otherwise all reviewed and reported negative  Exam:  General exam: thin male, awake, alert,  NAD.  Respiratory system: Clear. No increased work of breathing. Cardiovascular system: normal S1 & S2 heard. No JVD.  Gastrointestinal system: Abdomen is nondistended, soft and nontender. Normal bowel sounds heard. Central nervous system: Alert and oriented. No focal neurological deficits. Extremities: prepatellar bursitis right knee, no CCE.  Data Reviewed: Basic Metabolic Panel: Recent Labs  Lab 07/28/17 1658 07/28/17 2344 07/29/17 0416 08/01/17 0653  NA 138 139 139 140  K 5.5* 5.5* 4.5 4.5  CL 106 106 104 107  CO2 20* 21* 23 20*  GLUCOSE 153* 163* 95 118*  BUN 44* 47* 48* 48*  CREATININE 2.53* 2.67* 2.68* 1.70*  CALCIUM 9.1 9.0 8.6* 9.1   Liver Function Tests: Recent Labs  Lab 07/28/17 1658  AST 18  ALT 12*  ALKPHOS 88  BILITOT 0.5  PROT 8.2*  ALBUMIN 3.7   Recent Labs  Lab 07/28/17 1739  LIPASE 26   No results for input(s): AMMONIA in the last 168 hours. CBC: Recent Labs  Lab 07/28/17 1658 07/29/17 0416 08/01/17 0653  WBC 8.4 9.0 4.6  NEUTROABS 6.9  --   --   HGB 10.8* 9.7* 10.2*  HCT 36.3* 31.5* 33.6*  MCV 99.2 97.5 98.8  PLT 234 225 197   Cardiac Enzymes: No results for input(s): CKTOTAL, CKMB, CKMBINDEX, TROPONINI in the last 168 hours. CBG (last 3)  Recent Labs    07/31/17 0734  07/31/17 1101 07/31/17 1709  GLUCAP 87 84 102*   No results found for this or any previous visit (from the past 240 hour(s)).   Studies: Dg Knee Right Port  Result Date: 07/31/2017 CLINICAL DATA:  Pain and swelling for several days EXAM: PORTABLE RIGHT KNEE - 1-2 VIEW COMPARISON:  None. FINDINGS: no acute displaced fracture or malalignment is seen. Mild patellofemoral degenerative changes. Joint space calcifications. Prepatellar calcifications and oval soft tissue thickening suprapatellar superficial soft tissues. Vascular calcification IMPRESSION: 1. No acute fracture or malalignment 2. Mild degenerative changes.  Chondrocalcinosis 3. Soft tissue thickening and calcifications anterior to the patella suggesting prepatellar bursitis. Electronically Signed   By: Donavan Foil M.D.   On: 07/31/2017 17:59   Scheduled Meds: . Chlorhexidine Gluconate Cloth  6 each Topical Once  . metoprolol tartrate  2.5 mg Intravenous Q6H  . sodium chloride flush  3 mL Intravenous Q12H   Continuous Infusions: . sodium chloride 100 mL/hr (07/30/17 0546)    Principal Problem:   Bowel obstruction (HCC) Active Problems:   Essential hypertension   Hyperlipidemia   Coronary artery disease involving native coronary artery of native heart with angina pectoris (HCC)   CKD (chronic kidney disease) stage 3, GFR 30-59 ml/min (HCC)   PAF (paroxysmal atrial fibrillation) (HCC)   Intractable nausea and vomiting   Acute on chronic renal failure (HCC)   Gastric outlet obstruction   Neoplasm of uncertain behavior of mesentery   Preoperative cardiovascular examination  Time spent:   Irwin Brakeman, MD, FAAFP [facility] Pager [phone]  If 7PM-7AM, please contact night-coverage [email or web address] Password [email or web address] 08/01/2017, 11:25 AM    LOS: 4 days

## 2017-08-02 ENCOUNTER — Inpatient Hospital Stay (HOSPITAL_COMMUNITY): Payer: PPO | Admitting: Certified Registered Nurse Anesthetist

## 2017-08-02 ENCOUNTER — Encounter (HOSPITAL_COMMUNITY): Payer: Self-pay | Admitting: *Deleted

## 2017-08-02 ENCOUNTER — Inpatient Hospital Stay (HOSPITAL_COMMUNITY): Payer: PPO

## 2017-08-02 ENCOUNTER — Encounter (HOSPITAL_COMMUNITY)
Admission: EM | Disposition: A | Discharge status: Home / Self Care | Payer: Self-pay | Source: Home / Self Care | Attending: Family Medicine

## 2017-08-02 DIAGNOSIS — K565 Intestinal adhesions [bands], unspecified as to partial versus complete obstruction: Secondary | ICD-10-CM

## 2017-08-02 HISTORY — PX: LIVER BIOPSY: SHX301

## 2017-08-02 HISTORY — PX: GASTROJEJUNOSTOMY: SHX1697

## 2017-08-02 HISTORY — PX: BOWEL RESECTION: SHX1257

## 2017-08-02 LAB — CBC
HEMATOCRIT: 38.1 % — AB (ref 39.0–52.0)
HEMOGLOBIN: 11.6 g/dL — AB (ref 13.0–17.0)
MCH: 29.7 pg (ref 26.0–34.0)
MCHC: 30.4 g/dL (ref 30.0–36.0)
MCV: 97.7 fL (ref 78.0–100.0)
Platelets: 228 10*3/uL (ref 150–400)
RBC: 3.9 MIL/uL — ABNORMAL LOW (ref 4.22–5.81)
RDW: 17.5 % — AB (ref 11.5–15.5)
WBC: 8.6 10*3/uL (ref 4.0–10.5)

## 2017-08-02 LAB — BASIC METABOLIC PANEL
ANION GAP: 12 (ref 5–15)
BUN: 55 mg/dL — ABNORMAL HIGH (ref 6–20)
CALCIUM: 9.4 mg/dL (ref 8.9–10.3)
CHLORIDE: 100 mmol/L — AB (ref 101–111)
CO2: 26 mmol/L (ref 22–32)
Creatinine, Ser: 1.95 mg/dL — ABNORMAL HIGH (ref 0.61–1.24)
GFR calc non Af Amer: 32 mL/min — ABNORMAL LOW (ref >60)
GFR, EST AFRICAN AMERICAN: 37 mL/min — AB (ref >60)
GLUCOSE: 119 mg/dL — AB (ref 65–99)
Potassium: 3.8 mmol/L (ref 3.5–5.1)
Sodium: 138 mmol/L (ref 135–145)

## 2017-08-02 LAB — GLUCOSE, CAPILLARY
GLUCOSE-CAPILLARY: 105 mg/dL — AB (ref 65–99)
Glucose-Capillary: 105 mg/dL — ABNORMAL HIGH (ref 65–99)
Glucose-Capillary: 148 mg/dL — ABNORMAL HIGH (ref 65–99)

## 2017-08-02 SURGERY — GASTROJEJUNOSTOMY
Anesthesia: General

## 2017-08-02 MED ORDER — ONDANSETRON HCL 4 MG/2ML IJ SOLN
INTRAMUSCULAR | Status: AC
  Filled 2017-08-02: qty 2

## 2017-08-02 MED ORDER — ROCURONIUM BROMIDE 50 MG/5ML IV SOLN
INTRAVENOUS | Status: AC
  Filled 2017-08-02: qty 1

## 2017-08-02 MED ORDER — SUCCINYLCHOLINE CHLORIDE 20 MG/ML IJ SOLN
INTRAMUSCULAR | Status: DC | PRN
  Administered 2017-08-02: 100 mg via INTRAVENOUS

## 2017-08-02 MED ORDER — OXYCODONE HCL 5 MG PO TABS
5.0000 mg | ORAL_TABLET | ORAL | Status: DC | PRN
  Administered 2017-08-02: 10 mg via ORAL
  Administered 2017-08-02: 5 mg via ORAL
  Administered 2017-08-03: 10 mg via ORAL
  Administered 2017-08-03: 5 mg via ORAL
  Administered 2017-08-04 – 2017-08-06 (×5): 10 mg via ORAL
  Administered 2017-08-07 (×2): 5 mg via ORAL
  Administered 2017-08-08: 10 mg via ORAL
  Filled 2017-08-02: qty 2
  Filled 2017-08-02: qty 1
  Filled 2017-08-02 (×2): qty 2
  Filled 2017-08-02: qty 1
  Filled 2017-08-02 (×3): qty 2
  Filled 2017-08-02: qty 1
  Filled 2017-08-02: qty 2
  Filled 2017-08-02: qty 1
  Filled 2017-08-02: qty 2

## 2017-08-02 MED ORDER — MORPHINE SULFATE (PF) 2 MG/ML IV SOLN
2.0000 mg | INTRAVENOUS | Status: DC | PRN
Start: 2017-08-02 — End: 2017-08-10

## 2017-08-02 MED ORDER — PHENYLEPHRINE 40 MCG/ML (10ML) SYRINGE FOR IV PUSH (FOR BLOOD PRESSURE SUPPORT)
PREFILLED_SYRINGE | INTRAVENOUS | Status: AC
  Filled 2017-08-02: qty 10

## 2017-08-02 MED ORDER — FENTANYL CITRATE (PF) 250 MCG/5ML IJ SOLN
INTRAMUSCULAR | Status: AC
  Filled 2017-08-02: qty 5

## 2017-08-02 MED ORDER — BUPIVACAINE LIPOSOME 1.3 % IJ SUSP
INTRAMUSCULAR | Status: DC | PRN
  Administered 2017-08-02: 20 mL

## 2017-08-02 MED ORDER — LIDOCAINE HCL (PF) 1 % IJ SOLN
INTRAMUSCULAR | Status: AC
  Filled 2017-08-02: qty 5

## 2017-08-02 MED ORDER — PHENYLEPHRINE HCL 10 MG/ML IJ SOLN
INTRAMUSCULAR | Status: DC | PRN
  Administered 2017-08-02 (×2): 80 ug via INTRAVENOUS

## 2017-08-02 MED ORDER — SODIUM CHLORIDE 0.9 % IJ SOLN
INTRAMUSCULAR | Status: AC
  Filled 2017-08-02: qty 10

## 2017-08-02 MED ORDER — LACTATED RINGERS IV SOLN
INTRAVENOUS | Status: DC
  Administered 2017-08-02 (×2): via INTRAVENOUS

## 2017-08-02 MED ORDER — ONDANSETRON HCL 4 MG/2ML IJ SOLN
4.0000 mg | Freq: Once | INTRAMUSCULAR | Status: AC
  Administered 2017-08-02: 4 mg via INTRAVENOUS

## 2017-08-02 MED ORDER — ETOMIDATE 2 MG/ML IV SOLN
INTRAVENOUS | Status: DC | PRN
  Administered 2017-08-02: 14 mg via INTRAVENOUS

## 2017-08-02 MED ORDER — POVIDONE-IODINE 10 % OINT PACKET
TOPICAL_OINTMENT | CUTANEOUS | Status: DC | PRN
  Administered 2017-08-02: 1 via TOPICAL

## 2017-08-02 MED ORDER — SODIUM CHLORIDE 0.9 % IR SOLN
Status: DC | PRN
  Administered 2017-08-02: 2000 mL

## 2017-08-02 MED ORDER — FENTANYL CITRATE (PF) 100 MCG/2ML IJ SOLN
25.0000 ug | Freq: Once | INTRAMUSCULAR | Status: AC
  Administered 2017-08-02: 25 ug via INTRAVENOUS

## 2017-08-02 MED ORDER — ROCURONIUM BROMIDE 100 MG/10ML IV SOLN
INTRAVENOUS | Status: DC | PRN
  Administered 2017-08-02 (×3): 10 mg via INTRAVENOUS

## 2017-08-02 MED ORDER — FENTANYL CITRATE (PF) 100 MCG/2ML IJ SOLN
INTRAMUSCULAR | Status: AC
  Filled 2017-08-02: qty 2

## 2017-08-02 MED ORDER — GLYCOPYRROLATE 0.2 MG/ML IJ SOLN
INTRAMUSCULAR | Status: DC | PRN
  Administered 2017-08-02: .7 mg via INTRAVENOUS

## 2017-08-02 MED ORDER — FENTANYL CITRATE (PF) 100 MCG/2ML IJ SOLN
25.0000 ug | INTRAMUSCULAR | Status: DC | PRN
  Administered 2017-08-02 (×3): 50 ug via INTRAVENOUS
  Filled 2017-08-02 (×2): qty 2

## 2017-08-02 MED ORDER — SUCCINYLCHOLINE CHLORIDE 20 MG/ML IJ SOLN
INTRAMUSCULAR | Status: AC
  Filled 2017-08-02: qty 1

## 2017-08-02 MED ORDER — PROPOFOL 10 MG/ML IV BOLUS
INTRAVENOUS | Status: AC
  Filled 2017-08-02: qty 20

## 2017-08-02 MED ORDER — EPHEDRINE SULFATE 50 MG/ML IJ SOLN
INTRAMUSCULAR | Status: AC
  Filled 2017-08-02: qty 1

## 2017-08-02 MED ORDER — MIDAZOLAM HCL 2 MG/2ML IJ SOLN
INTRAMUSCULAR | Status: AC
  Filled 2017-08-02: qty 2

## 2017-08-02 MED ORDER — SODIUM CHLORIDE 0.9% FLUSH
INTRAVENOUS | Status: AC
  Filled 2017-08-02: qty 10

## 2017-08-02 MED ORDER — LIDOCAINE HCL (PF) 1 % IJ SOLN
INTRAMUSCULAR | Status: DC | PRN
  Administered 2017-08-02: 50 mg

## 2017-08-02 MED ORDER — FENTANYL CITRATE (PF) 100 MCG/2ML IJ SOLN
INTRAMUSCULAR | Status: DC | PRN
  Administered 2017-08-02: 100 ug via INTRAVENOUS
  Administered 2017-08-02: 25 ug via INTRAVENOUS
  Administered 2017-08-02: 50 ug via INTRAVENOUS
  Administered 2017-08-02: 25 ug via INTRAVENOUS

## 2017-08-02 MED ORDER — BUPIVACAINE LIPOSOME 1.3 % IJ SUSP
INTRAMUSCULAR | Status: AC
  Filled 2017-08-02: qty 20

## 2017-08-02 MED ORDER — ETOMIDATE 2 MG/ML IV SOLN
INTRAVENOUS | Status: AC
  Filled 2017-08-02: qty 10

## 2017-08-02 MED ORDER — SIMETHICONE 80 MG PO CHEW: 40.0000 mg | CHEWABLE_TABLET | Freq: Four times a day (QID) | ORAL | Status: DC | PRN

## 2017-08-02 MED ORDER — MIDAZOLAM HCL 2 MG/2ML IJ SOLN
1.0000 mg | INTRAMUSCULAR | Status: DC
Start: 2017-08-02 — End: 2017-08-02
  Administered 2017-08-02: 2 mg via INTRAVENOUS

## 2017-08-02 MED ORDER — CEFAZOLIN SODIUM-DEXTROSE 2-4 GM/100ML-% IV SOLN
2.0000 g | INTRAVENOUS | Status: AC
  Administered 2017-08-02: 2 g via INTRAVENOUS
  Filled 2017-08-02: qty 100

## 2017-08-02 MED ORDER — NEOSTIGMINE METHYLSULFATE 10 MG/10ML IV SOLN
INTRAVENOUS | Status: DC | PRN
  Administered 2017-08-02: 4 mg via INTRAVENOUS

## 2017-08-02 SURGICAL SUPPLY — 38 items
BAG HAMPER (MISCELLANEOUS) ×3 IMPLANT
CHLORAPREP W/TINT 26ML (MISCELLANEOUS) ×3 IMPLANT
CLOTH BEACON ORANGE TIMEOUT ST (SAFETY) ×3 IMPLANT
COVER LIGHT HANDLE STERIS (MISCELLANEOUS) ×6 IMPLANT
DRAPE WARM FLUID 44X44 (DRAPE) ×3 IMPLANT
DRSG OPSITE POSTOP 4X10 (GAUZE/BANDAGES/DRESSINGS) ×3 IMPLANT
ELECT REM PT RETURN 9FT ADLT (ELECTROSURGICAL) ×3
ELECTRODE REM PT RTRN 9FT ADLT (ELECTROSURGICAL) ×1 IMPLANT
GLOVE BIO SURGEON STRL SZ 6.5 (GLOVE) ×4 IMPLANT
GLOVE BIO SURGEONS STRL SZ 6.5 (GLOVE) ×2
GLOVE BIOGEL PI IND STRL 6.5 (GLOVE) ×2 IMPLANT
GLOVE BIOGEL PI IND STRL 7.0 (GLOVE) ×2 IMPLANT
GLOVE BIOGEL PI INDICATOR 6.5 (GLOVE) ×4
GLOVE BIOGEL PI INDICATOR 7.0 (GLOVE) ×4
GLOVE SURG SS PI 7.5 STRL IVOR (GLOVE) ×9 IMPLANT
GOWN STRL REUS W/TWL LRG LVL3 (GOWN DISPOSABLE) ×9 IMPLANT
HANDLE SUCTION POOLE (INSTRUMENTS) ×1 IMPLANT
INST SET MAJOR GENERAL (KITS) ×3 IMPLANT
KIT ROOM TURNOVER APOR (KITS) ×3 IMPLANT
LIGASURE IMPACT 36 18CM CVD LR (INSTRUMENTS) ×3 IMPLANT
MANIFOLD NEPTUNE II (INSTRUMENTS) ×3 IMPLANT
NEEDLE BIOPSY 14GX4.5 SOFT TIS (NEEDLE) ×3 IMPLANT
NEEDLE HYPO 18GX1.5 BLUNT FILL (NEEDLE) ×3 IMPLANT
NS IRRIG 1000ML POUR BTL (IV SOLUTION) ×6 IMPLANT
PACK ABDOMINAL MAJOR (CUSTOM PROCEDURE TRAY) ×3 IMPLANT
PAD ARMBOARD 7.5X6 YLW CONV (MISCELLANEOUS) ×3 IMPLANT
RELOAD PROXIMATE 75MM BLUE (ENDOMECHANICALS) ×6 IMPLANT
RETRACTOR WND ALEXIS 25 LRG (MISCELLANEOUS) ×1 IMPLANT
RTRCTR WOUND ALEXIS 25CM LRG (MISCELLANEOUS) ×3
SET BASIN LINEN APH (SET/KITS/TRAYS/PACK) ×3 IMPLANT
SPONGE LAP 18X18 X RAY DECT (DISPOSABLE) IMPLANT
STAPLER GUN LINEAR PROX 60 (STAPLE) ×6 IMPLANT
STAPLER PROXIMATE 75MM BLUE (STAPLE) ×6 IMPLANT
SUCTION POOLE HANDLE (INSTRUMENTS) ×3
SUT NOVA NAB GS-26 0 60 (SUTURE) IMPLANT
SUT SILK 3 0 SH CR/8 (SUTURE) ×3 IMPLANT
SYR 30ML LL (SYRINGE) ×3 IMPLANT
TRAY FOLEY CATH SILVER 16FR (SET/KITS/TRAYS/PACK) IMPLANT

## 2017-08-02 NOTE — Anesthesia Procedure Notes (Signed)
Procedure Name: Intubation Date/Time: 08/02/2017 1:45 PM Performed by: Lerry Liner, MD Pre-anesthesia Checklist: Patient identified, Patient being monitored, Timeout performed, Emergency Drugs available and Suction available Patient Re-evaluated:Patient Re-evaluated prior to induction Oxygen Delivery Method: Circle System Utilized Preoxygenation: Pre-oxygenation with 100% oxygen Induction Type: IV induction Ventilation: Mask ventilation without difficulty Laryngoscope Size: Miller and 2 (Disposable blade and handle) Grade View: Grade II Tube type: Oral Tube size: 7.0 mm Number of attempts: 1 Airway Equipment and Method: stylet Placement Confirmation: ETT inserted through vocal cords under direct vision,  positive ETCO2 and breath sounds checked- equal and bilateral Secured at: 22 cm Tube secured with: Tape Dental Injury: Teeth and Oropharynx as per pre-operative assessment  Comments: Noted loose lower front tooth prior to induction / intubation. MDA aware

## 2017-08-02 NOTE — Progress Notes (Signed)
[facility]  No major complaints. Resting. Pain controlled.   BP 126/67   Pulse 100   Temp (!) 97.1 F (36.2 C)   Resp 14   Ht 6' (1.829 m)   Wt 187 lb (84.8 kg)   SpO2 100%   BMI 25.36 kg/m  NAD Normal work breathing Soft, appropriately tender  NG in place, do no replace without speaking to surgery NPO but can have meds/ sips Labs in AM [place], MD [facility] [place], [place] [address] [phone] (office)

## 2017-08-02 NOTE — Anesthesia Postprocedure Evaluation (Signed)
Anesthesia Post Note  Patient: Stephen [name] [facility]  Procedure(s) Performed: GASTROJEJUNOSTOMY LIVER BIOPSY SMALL BOWEL RESECTION  Patient location during evaluation: PACU Anesthesia Type: General Level of consciousness: awake and alert and oriented Pain management: pain level controlled Vital Signs Assessment: post-procedure vital signs reviewed and stable Respiratory status: spontaneous breathing Cardiovascular status: blood pressure returned to baseline and stable Postop Assessment: no apparent nausea or vomiting Anesthetic complications: no     Last Vitals:  Vitals:   08/02/17 1530 08/02/17 1545  BP: (!) 172/75 (!) 166/79  Pulse: 96 90  Resp: 15 14  Temp: 36.9 C   SpO2: 100% 100%    Last Pain:  Vitals:   08/02/17 1530  TempSrc:   PainSc: 5                  [name]

## 2017-08-02 NOTE — Anesthesia Preprocedure Evaluation (Addendum)
Anesthesia Evaluation  Patient identified by MRN, date of birth, ID band Patient awake    Reviewed: Allergy & Precautions, NPO status , Patient's Chart, lab work & pertinent test results  Airway Mallampati: II  TM Distance: >3 FB     Dental  (+) Partial Lower, Teeth Intact   Pulmonary former smoker,    breath sounds clear to auscultation       Cardiovascular hypertension, Pt. on medications (-) angina+ CAD, + Cardiac Stents and + Peripheral Vascular Disease  + dysrhythmias Atrial Fibrillation  Rhythm:Regular Rate:Normal     Neuro/Psych CVA, Residual Symptoms    GI/Hepatic   Mesenteric mass with bowel obstruction   Endo/Other  diabetes, Type 2  Renal/GU Renal InsufficiencyRenal disease     Musculoskeletal   Abdominal   Peds  Hematology   Anesthesia Other Findings   Reproductive/Obstetrics                            Anesthesia Physical Anesthesia Plan  ASA: IV  Anesthesia Plan: General   Post-op Pain Management:    Induction: Intravenous, Rapid sequence and Cricoid pressure planned  PONV Risk Score and Plan:   Airway Management Planned: Oral ETT  Additional Equipment:   Intra-op Plan:   Post-operative Plan: Extubation in OR  Informed Consent: I have reviewed the patients History and Physical, chart, labs and discussed the procedure including the risks, benefits and alternatives for the proposed anesthesia with the patient or authorized representative who has indicated his/her understanding and acceptance.     Plan Discussed with:   Anesthesia Plan Comments: (amidate induction.)        Anesthesia Quick Evaluation

## 2017-08-02 NOTE — Op Note (Signed)
[facility] Operative Note  08/02/17  Preoperative Diagnosis: Gastric Outlet Obstruction, Neoplasm of the mesentery    Postoperative Diagnosis: Gastric Outlet Obstruction, Neoplasm of the mesentery, liver metastasis, small bowel ischemia    Procedure(s) Performed:  Exploratory laparotomy  Small bowel resection (~60cm) and primary side to side staple anastomosis  Palliative Gastrojejunostomy (Stapled)  Liver biopsy of nodule    Surgeon: Ria Comment C. Constance Haw, MD   Assistants: Aviva Signs, MD    Anesthesia: General endotracheal   Anesthesiologist: Lerry Liner, MD    Specimens: Small bowel and liver biopsy    Estimated Blood Loss: Minimal    Blood Replacement: None    Complications: None   Wound Class: Clean Contaminated    Operative Indications: Stephen [name] is a 76 yo with acute onset of abdominal pain, nausea/vomiting, found to have a mesteneric mass on CT scan with concerns for carcinoid given his urine 5HIAA and chromogranin levels in the setting of chronic diarrhea and weight loss. The patient was brought into the hospital, and NG tube was placed and cardiac risk stratification was performed. Given his likely diagnosis of carcinoid tumor with possible metastasis to the lungs (nodule on CT), it was decided that the patient needed to undergo a gastrojejunostomy with biopsy of the mass if possible to establish a diagnosis and allow for bypass of the obstruction caused by the mass. After a discussion of the risk and benefits with the patient and his family, he opted to proceed. Cardiology saw him preoperative and deemed him an intermediate risk, and GI and Oncology were following for the likely diagnosis of carcinoid tumor.   Findings: Liver nodularity, multiple scattered areas; Ischemic distal small bowel, retracted/ foreshortened mesentery causing vascular compromise to the distal small bowel; normal stomach   Procedure: The patient was taken to the operating  room and placed supine. General endotracheal anesthesia was induced. Intravenous antibiotics were administered per protocol.  A nasogastric tube was already in place prior to his induction given his gastric outlet obstruction.  The abdomen was prepared and draped in the usual sterile fashion.   A timeout was performed verifying the correct patient, procedure, equipment, and special needs.  The abdomen was scaphoid in nature, and an upper midline incision was made over the area of the stomach and probable location of the mesenteric mass at the root of the superior mesenteric artery.  On entering the abdomen there were some omental adhesions to the anterior abdominal wall which were taken down with the Ligasure.  The Liberty wound protector was used.  The abdomen was inspected, and the liver was noted to have multiple palpable nodules concerning for metastasis.  A prominent nodule on the anterior surface was biopsied with a TruCut device. Two core samples were obtained, and hemostasis of the liver was achieved with cautery.    Attention was then turned to the mesenteric mass and the small bowel. The distal small bowel was ischemic and the mesenteric mass was edematous and highly vascularized.  The mesentery was foreshortened/ and retracting the small bowel down. The bowel past this point in the mesentery was ischemic appearing.  Given the ischemia and likelihood that the small bowel wound further become compromised from the mass, it was resected.  A point proximally that was healthy and viable was chosen and a linear cutting stapler was used to transect the bowel.  A Ligasure device was used along the wall of the bowel to ligate the mesentery.  The entire ischemic portion of small bowel  was removed, totaling about 60cm, and at a point of healthy small bowel (approximately 40cm from the terminal ileum) the distal transection was performed with a linear cutting stapler.  A primary side to side anastamosis was made on  the antimesenteric border using a linear cutting stapler, and the staple line was hemostatic.  The common channel was closed with a TA stapler.  The lumen of the anastomosis was over 2 fingerbreadth.  The crotch of the anastomosis was approximated with a 3-0 silk suture. The staple edge was cauterized where bleeding.  The mesenteric defect was small due to the scalloped nature of the retracted mesentery into the abdomen.  Silk 3-0 sutures were used to loosely reapproximate the edges of the mesentery to prevent any defects from forming.  The small bowel remaining totaled over 200cm.    Attention then was turned to the stomach. The NG was in good position. The omentum on the inferior edge was ligated with the Liagsure, and the lesser sac as opened. The posterior wall of the stomach, and jejunum approximately 40-50cm distal to the ligament of treitz was selected for the gastrojejunostomy.  3-0 Silk stay sutures were placed.  With control of contamination a gastrotomy and enterotomy were made and the anastomosis was formed using a linear cutting stapler, ensuring that the distal lumen of the jejunum lie in a position to facilitate drainage from the stomach.  Additional silk sutures were placed at the end of the staple line to help approximate. The lumen of the gastrojejunostomy was over 3 fingerbreadth and the lumens of the proximal and distal jejunum were open. The bowel was laid into the abdomen in proper alignment.    Two 0 Norafil looped sutures were used to closed the abdomen. The skin was closed with staples, and betadine ointment and a dressing was placed.    Final inspection revealed acceptable hemostasis. All counts were correct at the end of the case. The patient was awakened from anesthesia and extubate without complication.  The patient went to the PACU in stable condition.   Given the mass, the liver nodularity, and the ischemic small bowel, the gastrojejunostomy is a palliative surgical procedure  that will hopefully aid in giving the patient comfort and allow the oncologist to determine what treatments, if any can be given.   Curlene Labrum, MD [facility] [address] [place], [place] [address] [phone] (office)

## 2017-08-02 NOTE — Transfer of Care (Signed)
Immediate Anesthesia Transfer of Care Note  Patient: Stephen [name] [facility]  Procedure(s) Performed: GASTROJEJUNOSTOMY LIVER BIOPSY SMALL BOWEL RESECTION  Patient Location: PACU  Anesthesia Type:General  Level of Consciousness: awake  Airway & Oxygen Therapy: Patient Spontanous Breathing and Patient connected to face mask oxygen  Post-op Assessment: Report given to RN  Post vital signs: Reviewed and stable  Last Vitals:  Vitals:   08/02/17 0951 08/02/17 1153  BP:  136/74  Pulse:  85  Resp:  (!) 24  Temp: 37 C 36.8 C  SpO2:      Last Pain:  Vitals:   08/02/17 1153  TempSrc: Oral  PainSc:       Patients Stated Pain Goal: 5 (74/93/55 2174)  Complications: No apparent anesthesia complications

## 2017-08-02 NOTE — Progress Notes (Signed)
PROGRESS NOTE  Stephen [name]  DXA:[id number]  DOB: [birth year]  DOA: 07/28/2017 PCP: [name], MD  Brief Admission Hx: Stephen [name] is a 76 y.o. male with past medical history significant for atrial fibrillation, stroke, CKD 3 and anemia presents the emergency room with chief complaint of nausea vomiting.  CT of the abdomen and pelvis showed abdominal mass around the root of the mesentery.  MDM/Assessment & Plan:   1. Mesenteric mass with bowel obstruction - Pt feels much better with NGT decompression.  NG came out but likely will be replaced today prior to surgery.  Continue IVFs.  General surgery has been consulted.  Dr. Constance Haw is planning to take to OR on Monday 08/02/17 for gastrojejunostomy.  Eliquis discontinued.  Dr. Talbert Cage with oncology consulted and planning to set patient up for PET scan and further outpatient follow up for biopsy and further management.  GI planning to start long-acting octreotide.   2. Acute on chronic CKD stage 3 - continue IVF hydration and monitor labs. Creatinine holding stable.  3. Hyperkalemia - resolved now. 4. CAD - cardiology started on peri-operative IV metoprolol.  5. Essential Hypertension - holding home BP meds for now.  IV hydralazine as needed.  6. Chronic Atrial Fibrillation - monitor rate, anticoagulation on hold for now for surgery, follow on telemetry 7. Chronic diastolic CHF - stable,  Holding home cozaar and lasix.  8. Type 2 Diabetes mellitus - supplemental sliding scale insulin ordered.  9. Dyslipidemia - holding statin for now.  10. Gout - holding home allopurinol.  11. Diarrhea - likely secondary to #1.  Holding home lomotil secondary to bowel obstruction.  12. Right knee pain - resolved after IV solumedrol was given 11/10.   DVT prophylaxis: lovenox Code Status: DNR Family Communication: daughter Disposition Plan: TBD  Consultants:  General surgery  Subjective: Pt had large amount of bilious vomiting this morning.    Objective: Vitals:   08/01/17 1332 08/01/17 2221 08/02/17 0547 08/02/17 0951  BP: (!) 153/56 (!) 147/66 (!) 156/66   Pulse: 73 81 (!) 110   Resp: 16 14 18    Temp: 97.8 F (36.6 C) 98.3 F (36.8 C) 98.1 F (36.7 C) 98.6 F (37 C)  TempSrc: Oral Oral Oral Oral  SpO2: 98% 98% 99%   Weight:      Height:        Intake/Output Summary (Last 24 hours) at 08/02/2017 1024 Last data filed at 08/02/2017 2094 Gross per 24 hour  Intake 0 ml  Output 3250 ml  Net -3250 ml   Filed Weights   07/29/17 0043 07/29/17 0500  Weight: 85 kg (187 lb 6.3 oz) 84.9 kg (187 lb 2.7 oz)    REVIEW OF SYSTEMS  As per history otherwise all reviewed and reported negative  Exam:  General exam: thin male, awake, alert,  NAD.  Respiratory system: Clear. No increased work of breathing. Cardiovascular system: normal S1 & S2 heard. No JVD.  Gastrointestinal system: Abdomen is nondistended, soft and nontender. Normal bowel sounds heard. Central nervous system: Alert and oriented. No focal neurological deficits. Extremities: prepatellar bursitis right knee improved, no CCE.  Data Reviewed: Basic Metabolic Panel: Recent Labs  Lab 07/28/17 1658 07/28/17 2344 07/29/17 0416 08/01/17 0653 08/02/17 0625  NA 138 139 139 140 138  K 5.5* 5.5* 4.5 4.5 3.8  CL 106 106 104 107 100*  CO2 20* 21* 23 20* 26  GLUCOSE 153* 163* 95 118* 119*  BUN 44* 47* 48* 48* 55*  CREATININE 2.53* 2.67* 2.68* 1.70* 1.95*  CALCIUM 9.1 9.0 8.6* 9.1 9.4   Liver Function Tests: Recent Labs  Lab 07/28/17 1658  AST 18  ALT 12*  ALKPHOS 88  BILITOT 0.5  PROT 8.2*  ALBUMIN 3.7   Recent Labs  Lab 07/28/17 1739  LIPASE 26   No results for input(s): AMMONIA in the last 168 hours. CBC: Recent Labs  Lab 07/28/17 1658 07/29/17 0416 08/01/17 0653 08/02/17 0625  WBC 8.4 9.0 4.6 8.6  NEUTROABS 6.9  --   --   --   HGB 10.8* 9.7* 10.2* 11.6*  HCT 36.3* 31.5* 33.6* 38.1*  MCV 99.2 97.5 98.8 97.7  PLT 234 225 197 228    Cardiac Enzymes: No results for input(s): CKTOTAL, CKMB, CKMBINDEX, TROPONINI in the last 168 hours. CBG (last 3)  Recent Labs    07/31/17 0734 07/31/17 1101 07/31/17 1709  GLUCAP 87 84 102*   No results found for this or any previous visit (from the past 240 hour(s)).   Studies: Dg Abd 1 View  Result Date: 08/02/2017 CLINICAL DATA:  76 year old male status post NG tube placement. Partially calcified mesenteric mass. EXAM: ABDOMEN - 1 VIEW COMPARISON:  CT Abdomen and Pelvis 07/28/2017 FINDINGS: Portable AP supine view at 0915 hours. Enteric tube courses to the abdomen and side hole is at the level of the proximal gastric body (arrow). Paucity bowel gas otherwise. Coarse calcifications in the left abdomen correspond to nephrolithiasis. Coarse calcifications in the right upper quadrant correspond a right upper pole nephrolithiasis. Negative lung bases. Endplate degeneration in the spine. No acute osseous abnormality identified. IMPRESSION: 1. NG tube side hole at the level of the proximal gastric body. 2. Nephrolithiasis.  Paucity of bowel gas. Electronically Signed   By: Genevie Ann M.D.   On: 08/02/2017 09:38   Dg Knee Right Port  Result Date: 07/31/2017 CLINICAL DATA:  Pain and swelling for several days EXAM: PORTABLE RIGHT KNEE - 1-2 VIEW COMPARISON:  None. FINDINGS: no acute displaced fracture or malalignment is seen. Mild patellofemoral degenerative changes. Joint space calcifications. Prepatellar calcifications and oval soft tissue thickening suprapatellar superficial soft tissues. Vascular calcification IMPRESSION: 1. No acute fracture or malalignment 2. Mild degenerative changes.  Chondrocalcinosis 3. Soft tissue thickening and calcifications anterior to the patella suggesting prepatellar bursitis. Electronically Signed   By: Donavan Foil M.D.   On: 07/31/2017 17:59   Scheduled Meds: . metoprolol tartrate  2.5 mg Intravenous Q6H  . sodium chloride flush  3 mL Intravenous Q12H    Continuous Infusions: . sodium chloride 100 mL/hr (07/30/17 0546)    Principal Problem:   Bowel obstruction (HCC) Active Problems:   Essential hypertension   Hyperlipidemia   Coronary artery disease involving native coronary artery of native heart with angina pectoris (HCC)   CKD (chronic kidney disease) stage 3, GFR 30-59 ml/min (HCC)   PAF (paroxysmal atrial fibrillation) (HCC)   Intractable nausea and vomiting   Acute on chronic renal failure (HCC)   Gastric outlet obstruction   Neoplasm of uncertain behavior of mesentery   Preoperative cardiovascular examination  Time spent:   Irwin Brakeman, MD, FAAFP [facility] Pager [phone]  If 7PM-7AM, please contact night-coverage [email or web address] Password [email or web address] 08/02/2017, 10:24 AM    LOS: 5 days

## 2017-08-02 NOTE — Interval H&P Note (Signed)
History and Physical Interval Note:  08/02/2017 10:31 AM  Les Pou  has presented today for surgery, with the diagnosis of Gastric Outlet Obstruction and Mesenteric Mass  The various methods of treatment have been discussed with the patient and family. After consideration of risks, benefits and other options for treatment, the patient has consented to  Procedure(s): GASTROJEJUNOSTOMY and biopsy of mesenteric mass (N/A) as a surgical intervention .  The patient's history has been reviewed, patient examined, no change in status, stable for surgery.  I have reviewed the patient's chart and labs.  Questions were answered to the patient's satisfaction.    Discussed procedure with patient and family extensively. NG in place. 2U pRBC crossed pending need.   Virl Cagey

## 2017-08-03 ENCOUNTER — Encounter (INDEPENDENT_AMBULATORY_CARE_PROVIDER_SITE_OTHER): Payer: Self-pay | Admitting: Internal Medicine

## 2017-08-03 LAB — BASIC METABOLIC PANEL
Anion gap: 13 (ref 5–15)
BUN: 63 mg/dL — ABNORMAL HIGH (ref 6–20)
CHLORIDE: 101 mmol/L (ref 101–111)
CO2: 26 mmol/L (ref 22–32)
CREATININE: 2.54 mg/dL — AB (ref 0.61–1.24)
Calcium: 8.6 mg/dL — ABNORMAL LOW (ref 8.9–10.3)
GFR calc non Af Amer: 23 mL/min — ABNORMAL LOW (ref >60)
GFR, EST AFRICAN AMERICAN: 27 mL/min — AB (ref >60)
Glucose, Bld: 135 mg/dL — ABNORMAL HIGH (ref 65–99)
POTASSIUM: 3.8 mmol/L (ref 3.5–5.1)
Sodium: 140 mmol/L (ref 135–145)

## 2017-08-03 LAB — CBC
HEMATOCRIT: 38.2 % — AB (ref 39.0–52.0)
HEMOGLOBIN: 11.6 g/dL — AB (ref 13.0–17.0)
MCH: 29.8 pg (ref 26.0–34.0)
MCHC: 30.4 g/dL (ref 30.0–36.0)
MCV: 98.2 fL (ref 78.0–100.0)
Platelets: 180 10*3/uL (ref 150–400)
RBC: 3.89 MIL/uL — AB (ref 4.22–5.81)
RDW: 17.8 % — ABNORMAL HIGH (ref 11.5–15.5)
WBC: 9.7 10*3/uL (ref 4.0–10.5)

## 2017-08-03 MED ORDER — LACTATED RINGERS IV BOLUS (SEPSIS)
500.0000 mL | Freq: Once | INTRAVENOUS | Status: AC
  Administered 2017-08-03: 500 mL via INTRAVENOUS

## 2017-08-03 MED ORDER — METHYLPREDNISOLONE SODIUM SUCC 40 MG IJ SOLR
20.0000 mg | Freq: Once | INTRAMUSCULAR | Status: AC
  Administered 2017-08-03: 20 mg via INTRAVENOUS
  Filled 2017-08-03: qty 1

## 2017-08-03 MED ORDER — ENOXAPARIN SODIUM 80 MG/0.8ML ~~LOC~~ SOLN
1.0000 mg/kg | SUBCUTANEOUS | Status: DC
  Administered 2017-08-03 – 2017-08-08 (×6): 80 mg via SUBCUTANEOUS
  Filled 2017-08-03 (×6): qty 0.8

## 2017-08-03 NOTE — Progress Notes (Signed)
RN educated pt and daughter about pt being NPO. Daughter keeps asking for ice chips for pt, RN educated that the more intake that pt has the more output from his NGT and the longer pt may have to leave NGT in. Pt and daughter verbalized understanding, but RN has caught daughter more than once giving ice tonight. Will continue to monitor.

## 2017-08-03 NOTE — Progress Notes (Signed)
Dr. Hal Hope and Dr. Constance Haw paged and made aware of pts BP in the 80's and 90's and HR 116-120s, metoprolol not given at this time d/t low BP. Adv as well as bladder scan showing 300 in bladder. Waiting for call back/orders.

## 2017-08-03 NOTE — Progress Notes (Signed)
ANTICOAGULATION CONSULT NOTE - Follow Up Consult  Pharmacy Consult for Lovenox Indication: atrial fibrillation  No Known Allergies  Patient Measurements: Height: 6' (182.9 cm) Weight: 179 lb 10.8 oz (81.5 kg) IBW/kg (Calculated) : 77.6  Vital Signs: Temp: 97.8 F (36.6 C) (11/13 0529) Temp Source: Oral (11/13 0529) BP: 94/49 (11/13 0929) Pulse Rate: 74 (11/13 0929)  Labs: Recent Labs    08/01/17 0653 08/02/17 0625 08/03/17 0504  HGB 10.2* 11.6* 11.6*  HCT 33.6* 38.1* 38.2*  PLT 197 228 180  CREATININE 1.70* 1.95* 2.54*    Estimated Creatinine Clearance: 27.2 mL/min (A) (by C-G formula based on SCr of 2.54 mg/dL (H)).   Medications:  Medications Prior to Admission  Medication Sig Dispense Refill Last Dose  . acetaminophen (TYLENOL) 500 MG tablet Take 500 mg by mouth every 6 (six) hours as needed.   07/28/2017 at Unknown time  . allopurinol (ZYLOPRIM) 100 MG tablet Take 200 mg by mouth daily.    07/28/2017 at Unknown time  . apixaban (ELIQUIS) 5 MG TABS tablet Take 1 tablet (5 mg total) by mouth 2 (two) times daily. 180 tablet 3 07/28/2017 at Unknown time  . aspirin 81 MG tablet Take 81 mg by mouth daily.   07/28/2017 at Unknown time  . Cholecalciferol (VITAMIN D-3) 1000 units CAPS Take 1 capsule by mouth daily.   07/28/2017 at Unknown time  . feeding supplement, ENSURE ENLIVE, (ENSURE ENLIVE) LIQD Take 237 mLs by mouth 2 (two) times daily between meals. 90 Bottle 0 Past Month at Unknown time  . furosemide (LASIX) 40 MG tablet Take 1 tablet (40 mg total) by mouth daily as needed for fluid or edema (or more than 2-lbs of weight gain). (Patient taking differently: Take 60 mg daily by mouth. ) 225 tablet 2 07/28/2017 at Unknown time  . insulin detemir (LEVEMIR) 100 UNIT/ML injection Inject 0.2 mLs (20 Units total) into the skin at bedtime. 10 mL 11 07/27/2017 at Unknown time  . loperamide (IMODIUM) 2 MG capsule Take 1 capsule (2 mg total) by mouth 3 (three) times daily before meals.  90 capsule 0 07/28/2017 at Unknown time  . losartan (COZAAR) 100 MG tablet Take 100 mg by mouth daily.   07/28/2017 at Unknown time  . Melatonin 10 MG CAPS Take 10 mg by mouth at bedtime.   07/27/2017 at Unknown time  . OVER THE COUNTER MEDICATION OTC Sleepinal - patient takes 1 at bedtime.   07/27/2017 at Unknown time  . pantoprazole (PROTONIX) 40 MG tablet Take 1 tablet (40 mg total) by mouth daily before breakfast. 30 tablet 5 07/28/2017 at Unknown time  . pravastatin (PRAVACHOL) 40 MG tablet Take 40 mg by mouth daily.   07/27/2017 at Unknown time  . Probiotic Product (PROBIOTIC DAILY PO) Take 1 tablet daily by mouth.    07/28/2017 at Unknown time  . prochlorperazine (COMPAZINE) 10 MG tablet Take 10 mg every 6 (six) hours as needed by mouth for nausea or vomiting.    07/28/2017 at Unknown time  . Psyllium (EQ DAILY FIBER PO) Take 1 capsule every morning by mouth. Per Patient's daughter states that he takes 1 a every morning.    07/28/2017 at Unknown time  . sodium bicarbonate 650 MG tablet Take 650 mg by mouth 3 (three) times daily.    07/28/2017 at Unknown time  . vitamin B-12 (CYANOCOBALAMIN) 1000 MCG tablet Take 1 tablet (1,000 mcg total) by mouth daily. Take 2 tabs by mouth daily. 60 tablet 5 07/28/2017 at  Unknown time    Assessment: Stephen [name] is 76 yo male with a. Fib, CKD who presented to ED with nausea/vomiting. With CrCl will only dose lovenox daily, Eliquis PTA..  S/P 11/12: Exploratory laparotomy, Small bowel resection (~60cm) and primary side to side staple anastomosis,Palliative Gastrojejunostomy (Stapled), Liver biopsy of nodule   Goal of Therapy:  Monitor platelets by anticoagulation protocol: Yes   Plan:  Lovenox 1mg /kg = 80mg  q24h F/U plan and transition back to eliquis as indicated Monitor for S/S of bleeding  Pricilla Larsson, [facility]  08/03/2017,11:22 AM

## 2017-08-03 NOTE — Progress Notes (Signed)
[facility] Progress Note  1 Day Post-Op  Subjective: Hypotension, tachycardia overnight and some low urine output, 500cc bolus given overnight and foley placed. Otherwise doing ok this Am. No major complaints. NG tube in place. Repeated 500cc bolus this AM with Cr elevation.   Objective: Vital signs in last 24 hours: Temp:  [97.1 F (36.2 C)-99.2 F (37.3 C)] 98 F (36.7 C) (11/13 1216) Pulse Rate:  [74-117] 100 (11/13 1216) Resp:  [12-20] 16 (11/13 1216) BP: (75-172)/(47-79) 93/51 (11/13 1216) SpO2:  [93 %-100 %] 93 % (11/13 1216) Weight:  [179 lb 10.8 oz (81.5 kg)] 179 lb 10.8 oz (81.5 kg) (11/13 0500) Last BM Date: 08/01/17  Intake/Output from previous day: 11/12 0701 - 11/13 0700 In: 3053.3 [P.O.:75; I.V.:2978.3] Out: 4000 [Urine:450; Emesis/NG output:3500; Blood:50] Intake/Output this shift: Total I/O In: 3 [I.V.:3] Out: 100 [Emesis/NG output:100]  General appearance: alert, cooperative and no distress Resp: normal work breathing GI: soft, appropriately tender, midline with dressing, minimal staining Extremities: extremities normal, atraumatic, no cyanosis or edema  Lab Results:  Recent Labs    08/02/17 0625 08/03/17 0504  WBC 8.6 9.7  HGB 11.6* 11.6*  HCT 38.1* 38.2*  PLT 228 180   BMET Recent Labs    08/02/17 0625 08/03/17 0504  NA 138 140  K 3.8 3.8  CL 100* 101  CO2 26 26  GLUCOSE 119* 135*  BUN 55* 63*  CREATININE 1.95* 2.54*  CALCIUM 9.4 8.6*   PT/INR No results for input(s): LABPROT, INR in the last 72 hours.  Studies/Results: Dg Abd 1 View  Result Date: 08/02/2017 CLINICAL DATA:  76 year old male status post NG tube placement. Partially calcified mesenteric mass. EXAM: ABDOMEN - 1 VIEW COMPARISON:  CT Abdomen and Pelvis 07/28/2017 FINDINGS: Portable AP supine view at 0915 hours. Enteric tube courses to the abdomen and side hole is at the level of the proximal gastric body (arrow). Paucity bowel gas otherwise. Coarse  calcifications in the left abdomen correspond to nephrolithiasis. Coarse calcifications in the right upper quadrant correspond a right upper pole nephrolithiasis. Negative lung bases. Endplate degeneration in the spine. No acute osseous abnormality identified. IMPRESSION: 1. NG tube side hole at the level of the proximal gastric body. 2. Nephrolithiasis.  Paucity of bowel gas. Electronically Signed   By: Genevie Ann M.D.   On: 08/02/2017 09:38    Anti-infectives: Anti-infectives (From admission, onward)   Start     Dose/Rate Route Frequency Ordered Stop   08/02/17 1300  ceFAZolin (ANCEF) IVPB 2g/100 mL premix     2 g 200 mL/hr over 30 Minutes Intravenous On call to O.R. 08/02/17 1253 08/02/17 1400   07/30/17 0900  ceFAZolin (ANCEF) IVPB 2g/100 mL premix     2 g 200 mL/hr over 30 Minutes Intravenous On call to O.R. 07/30/17 0858 07/30/17 1029      Assessment/Plan: Stephen [name] is a 4 yo with a gastric outlet obstruction and mesenteric mass, likely carcinoid, POD 1 s/p Ex lap, gastrojejunostomy, liver biopsies of nodules, small bowel resection of ischemic bowel from the mesenteric mass. Doing fair. Some hypotension and low urine, but improved with fluids. Pain controlled.  -PRN For pain -IS, OOB -Ambulate if possible -Leave NG for now -IVF, Monitor UOP and Cr -Repeat labs -Can restart lovenox   Discussed with Dr. Wynetta Emery, will continue to follow.    LOS: 6 days    Virl Cagey 08/03/2017

## 2017-08-03 NOTE — Progress Notes (Signed)
Dr. Hal Hope paged to see if another dose of Solumedrol could be ordered for pt d/t R knee pain. Pt received on 11/10 with relief Waiting for orders/call back.

## 2017-08-03 NOTE — Anesthesia Postprocedure Evaluation (Signed)
Anesthesia Post Note  Patient: Stephen [name] Stephen [name]  Procedure(s) Performed: GASTROJEJUNOSTOMY LIVER BIOPSY SMALL BOWEL RESECTION  Patient location during evaluation: Nursing Unit Anesthesia Type: General Level of consciousness: awake Pain management: satisfactory to patient Vital Signs Assessment: post-procedure vital signs reviewed and stable Respiratory status: spontaneous breathing Cardiovascular status: stable Postop Assessment: no apparent nausea or vomiting Anesthetic complications: no     Last Vitals:  Vitals:   08/03/17 0929 08/03/17 1216  BP: (!) 94/49 (!) 93/51  Pulse: 74 100  Resp:  16  Temp:  36.7 C  SpO2:  93%    Last Pain:  Vitals:   08/03/17 1226  TempSrc:   PainSc: 4                  Aveleen Nevers

## 2017-08-03 NOTE — Progress Notes (Signed)
[facility] Oncology Progress Note  Name: Stephen [name]      MRN: [id number]    Location: A306/A306-01  Date: 08/03/2017 Time:9:33 AM   Subjective: Interval History:Stephen [name] seen for suspicion of carcinoid tumor of GI origin.   Seen lying in hospital bed, with many members of his family at his bedside.  He tells me that he feels weak and tired; recently underwent surgery yesterday.  He feels like his N&V and abd pain is slightly better with the NGT in place.  NGT remains in place at present.     Objective: Vital signs in last 24 hours: Temp:  [97.1 F (36.2 C)-99.2 F (37.3 C)] 97.8 F (36.6 C) (11/13 0529) Pulse Rate:  [74-117] 74 (11/13 0929) Resp:  [12-24] 16 (11/13 0808) BP: (75-172)/(47-79) 94/49 (11/13 0929) SpO2:  [97 %-100 %] 97 % (11/13 0529) Weight:  [179 lb 10.8 oz (81.5 kg)-187 lb (84.8 kg)] 179 lb 10.8 oz (81.5 kg) (11/13 0500)    Intake/Output from previous day: 11/12 0800 - 11/13 0759 In: 3053.3 [P.O.:75; I.V.:2978.3] Out: 4000 [Urine:450]   . Intake/Output this shift: Total I/O In: 3 [I.V.:3] Out: 100 [Emesis/NG output:100]   PHYSICAL EXAM: Physical Exam  Constitutional: He is oriented to person, place, and time.  Seen in hospital bed with family at bedside   HENT:  Head: Normocephalic.  Eyes: Conjunctivae are normal. No scleral icterus.  Neck: Normal range of motion.  Cardiovascular: Normal rate and regular rhythm.  Pulmonary/Chest: Effort normal. No respiratory distress.  Diminished breath sounds bilat bases  Abdominal: Soft.  NGT in place  Musculoskeletal: He exhibits edema (Mild pedal edema).  Neurological: He is alert and oriented to person, place, and time.  Skin: Skin is warm and dry.  Psychiatric: Mood and affect normal.  Nursing note and vitals reviewed.     Studies/Results: Results for orders placed or performed during the hospital encounter of 07/28/17 (from the past 48 hour(s))  Type and screen [facility]      Status: None (Preliminary result)   Collection Time: 08/01/17 12:30 PM  Result Value Ref Range   ABO/RH(D) O POS    Antibody Screen NEG    Sample Expiration 08/04/2017    Unit Number [id number]    Blood Component Type RED CELLS,LR    Unit division 00    Status of Unit ALLOCATED    Transfusion Status OK TO TRANSFUSE    Crossmatch Result Compatible    Unit Number [id number]    Blood Component Type RED CELLS,LR    Unit division 00    Status of Unit ALLOCATED    Transfusion Status OK TO TRANSFUSE    Crossmatch Result Compatible   Prepare RBC (crossmatch)     Status: None   Collection Time: 08/01/17 12:30 PM  Result Value Ref Range   Order Confirmation ORDER PROCESSED BY BLOOD BANK   CBC     Status: Abnormal   Collection Time: 08/02/17  6:25 AM  Result Value Ref Range   WBC 8.6 4.0 - 10.5 K/uL   RBC 3.90 (L) 4.22 - 5.81 MIL/uL   Hemoglobin 11.6 (L) 13.0 - 17.0 g/dL   HCT 38.1 (L) 39.0 - 52.0 %   MCV 97.7 78.0 - 100.0 fL   MCH 29.7 26.0 - 34.0 pg   MCHC 30.4 30.0 - 36.0 g/dL   RDW 17.5 (H) 11.5 - 15.5 %   Platelets 228 150 - 400 K/uL  Basic metabolic panel  Status: Abnormal   Collection Time: 08/02/17  6:25 AM  Result Value Ref Range   Sodium 138 135 - 145 mmol/L   Potassium 3.8 3.5 - 5.1 mmol/L   Chloride 100 (L) 101 - 111 mmol/L   CO2 26 22 - 32 mmol/L   Glucose, Bld 119 (H) 65 - 99 mg/dL   BUN 55 (H) 6 - 20 mg/dL   Creatinine, Ser 1.95 (H) 0.61 - 1.24 mg/dL   Calcium 9.4 8.9 - 10.3 mg/dL   GFR calc non Af Amer 32 (L) >60 mL/min   GFR calc Af Amer 37 (L) >60 mL/min    Comment: (NOTE) The eGFR has been calculated using the CKD EPI equation. This calculation has not been validated in all clinical situations. eGFR's persistently <60 mL/min signify possible Chronic Kidney Disease.    Anion gap 12 5 - 15  Glucose, capillary     Status: Abnormal   Collection Time: 08/02/17 11:32 AM  Result Value Ref Range   Glucose-Capillary 105 (H) 65 - 99 mg/dL  Glucose,  capillary     Status: Abnormal   Collection Time: 08/02/17  3:32 PM  Result Value Ref Range   Glucose-Capillary 148 (H) 65 - 99 mg/dL  CBC     Status: Abnormal   Collection Time: 08/03/17  5:04 AM  Result Value Ref Range   WBC 9.7 4.0 - 10.5 K/uL   RBC 3.89 (L) 4.22 - 5.81 MIL/uL   Hemoglobin 11.6 (L) 13.0 - 17.0 g/dL   HCT 38.2 (L) 39.0 - 52.0 %   MCV 98.2 78.0 - 100.0 fL   MCH 29.8 26.0 - 34.0 pg   MCHC 30.4 30.0 - 36.0 g/dL   RDW 17.8 (H) 11.5 - 15.5 %   Platelets 180 150 - 400 K/uL  Basic metabolic panel     Status: Abnormal   Collection Time: 08/03/17  5:04 AM  Result Value Ref Range   Sodium 140 135 - 145 mmol/L   Potassium 3.8 3.5 - 5.1 mmol/L   Chloride 101 101 - 111 mmol/L   CO2 26 22 - 32 mmol/L   Glucose, Bld 135 (H) 65 - 99 mg/dL   BUN 63 (H) 6 - 20 mg/dL   Creatinine, Ser 2.54 (H) 0.61 - 1.24 mg/dL   Calcium 8.6 (L) 8.9 - 10.3 mg/dL   GFR calc non Af Amer 23 (L) >60 mL/min   GFR calc Af Amer 27 (L) >60 mL/min    Comment: (NOTE) The eGFR has been calculated using the CKD EPI equation. This calculation has not been validated in all clinical situations. eGFR's persistently <60 mL/min signify possible Chronic Kidney Disease.    Anion gap 13 5 - 15   Dg Abd 1 View  Result Date: 08/02/2017 CLINICAL DATA:  76 year old male status post NG tube placement. Partially calcified mesenteric mass. EXAM: ABDOMEN - 1 VIEW COMPARISON:  CT Abdomen and Pelvis 07/28/2017 FINDINGS: Portable AP supine view at 0915 hours. Enteric tube courses to the abdomen and side hole is at the level of the proximal gastric body (arrow). Paucity bowel gas otherwise. Coarse calcifications in the left abdomen correspond to nephrolithiasis. Coarse calcifications in the right upper quadrant correspond a right upper pole nephrolithiasis. Negative lung bases. Endplate degeneration in the spine. No acute osseous abnormality identified. IMPRESSION: 1. NG tube side hole at the level of the proximal gastric  body. 2. Nephrolithiasis.  Paucity of bowel gas. Electronically Signed   By: Herminio Heads.D.  On: 08/02/2017 09:38     MEDICATIONS: I have reviewed the patient's current medications.         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 Constance Haw on 08/02/17. Awaiting pathology results.  I explained to the patient/family our suspicion for carcinoid/neuroendocrine tumor.   -Discussed with Dr. Talbert Cage.  Maintain PET scan on 08/10/17 as an outpatient. We will see him for outpatient follow-up as scheduled on 08/16/17.  No emergent need for inpatient treatment; will await his recovery from current hospitalization.     All questions were answered. The patient knows to call the clinic with any problems, questions or concerns. We can certainly see the patient much sooner if necessary.  The patient and plan discussed with Dr. Twana First and she is in agreement with the aforementioned.   Mike Craze, NP [place] [phone]

## 2017-08-03 NOTE — Care Management Note (Signed)
Case Management Note  Patient Details  Name: Stephen [name] MRN: [id number] Date of Birth: 17-Dec-1940  If discussed at Long Length of Stay Meetings, dates discussed:  08/03/2017 Additional Comments:  Sherald Barge, RN 08/03/2017, 1:06 PM

## 2017-08-03 NOTE — Progress Notes (Signed)
Patient noted to have 100 ml urine output via foley catheter for this shift. Denies discomfort or fullness. Bladder scan volume 0 ml with multiple scans. Dr Constance Haw notified on rounding this evening of low urine output. Also notified patient has had 2 cups of ice chips on this shift with 700 ml NG output since this am. Text-paged Dr. Wynetta Emery to notify of urine output and bladder scan results as well. Donavan Foil, RN

## 2017-08-03 NOTE — Progress Notes (Signed)
Patient assisted up to chair, tolerated well. Call light and personal items within reach. Instructed to call for assistance and not attempt getting up on his own. Verbalized understanding. Pt states comfortable sitting up. Requested tylenol for pain earlier instead of oxycodone he had been taking during night. Stated "I need something not as strong." reported abdominal pain relieved with tylenol.  Donavan Foil, RN

## 2017-08-03 NOTE — Progress Notes (Signed)
After speaking with Dr.Bridges, RN was advised to give Metoprolol as scheduled. Give 547mL bolus of LR and if pt does not void within 2 hours, place foley catheter.  Will continue to monitor pt

## 2017-08-03 NOTE — Progress Notes (Signed)
PROGRESS NOTE  Stephen [name]  DEY:[id number]  DOB: [birth year]  DOA: 07/28/2017 PCP: [name], MD  Brief Admission Hx: Stephen [name] is a 76 y.o. male with past medical history significant for atrial fibrillation, stroke, CKD 3 and anemia presents the emergency room with chief complaint of nausea vomiting.  CT of the abdomen and pelvis showed abdominal mass around the root of the mesentery.  MDM/Assessment & Plan:   1. Mesenteric mass with bowel obstruction - Pt feels much better with NGT decompression.  NG came out but likely will be replaced today prior to surgery.  Continue IVFs.  General surgery has been consulted.  Dr. Constance Haw is planning to take to OR on Monday 08/02/17 for gastrojejunostomy.  Eliquis discontinued.  Dr. Talbert Cage with oncology consulted and planning to set patient up for PET scan and further outpatient follow up for biopsy and further management.  They will see patient on 11/26 outpatient.  GI planning to start long-acting octreotide.   2. Acute on chronic CKD stage 3 - continue IVF hydration and monitor labs. Creatinine holding stable.  3. Hypotension - improving with IVF hydration.  4. Hyperkalemia - resolved now. 5. CAD - cardiology started on peri-operative IV metoprolol - will continue for at least 24 hours after surgery for cardiac protection then could resume home beta blocker.   6. Essential Hypertension - holding home BP meds for now.  IV metoprolol ordered. BPs have been soft post surgery.  7. Chronic Atrial Fibrillation - monitor rate, anticoagulation on hold for now for surgery, follow on telemetry 8. Chronic diastolic CHF - stable,  Holding home cozaar and lasix.  9. Type 2 Diabetes mellitus - supplemental sliding scale insulin ordered.  10. Dyslipidemia - holding statin for now.  11. Gout - holding home allopurinol.  12. Diarrhea - likely secondary to #1.  Holding home lomotil secondary to bowel obstruction.  13. Right knee pain - resolved after IV  solumedrol was given 11/10.   DVT prophylaxis: lovenox Code Status: DNR Family Communication: daughter Disposition Plan: TBD  Consultants:  General surgery  Subjective: Pt says he feels weak but overall doing fine.  NG tube is helping him feel better.   Objective: Vitals:   08/03/17 0500 08/03/17 0529 08/03/17 0808 08/03/17 0929  BP:  (!) 92/57 (!) 87/51 (!) 94/49  Pulse:  (!) 102 90 74  Resp:  20 16   Temp:  97.8 F (36.6 C)    TempSrc:  Oral    SpO2:  97%    Weight: 81.5 kg (179 lb 10.8 oz)     Height:        Intake/Output Summary (Last 24 hours) at 08/03/2017 1024 Last data filed at 08/03/2017 0809 Gross per 24 hour  Intake 3056.33 ml  Output 1200 ml  Net 1856.33 ml   Filed Weights   07/29/17 0500 08/02/17 1153 08/03/17 0500  Weight: 84.9 kg (187 lb 2.7 oz) 84.8 kg (187 lb) 81.5 kg (179 lb 10.8 oz)    REVIEW OF SYSTEMS  As per history otherwise all reviewed and reported negative  Exam:  General exam: thin male, awake, alert,  NAD.  NGT in place.  Respiratory system: Clear. No increased work of breathing. Cardiovascular system: normal S1 & S2 heard. No JVD.  Gastrointestinal system: Abdomen is nondistended, soft.  Bandages clean and dry.   Central nervous system: Alert and oriented. No focal neurological deficits. Extremities: prepatellar bursitis right knee improved, no CCE.  Data Reviewed: Basic Metabolic Panel:  Recent Labs  Lab 07/28/17 2344 07/29/17 0416 08/01/17 0653 08/02/17 0625 08/03/17 0504  NA 139 139 140 138 140  K 5.5* 4.5 4.5 3.8 3.8  CL 106 104 107 100* 101  CO2 21* 23 20* 26 26  GLUCOSE 163* 95 118* 119* 135*  BUN 47* 48* 48* 55* 63*  CREATININE 2.67* 2.68* 1.70* 1.95* 2.54*  CALCIUM 9.0 8.6* 9.1 9.4 8.6*   Liver Function Tests: Recent Labs  Lab 07/28/17 1658  AST 18  ALT 12*  ALKPHOS 88  BILITOT 0.5  PROT 8.2*  ALBUMIN 3.7   Recent Labs  Lab 07/28/17 1739  LIPASE 26   No results for input(s): AMMONIA in the last  168 hours. CBC: Recent Labs  Lab 07/28/17 1658 07/29/17 0416 08/01/17 0653 08/02/17 0625 08/03/17 0504  WBC 8.4 9.0 4.6 8.6 9.7  NEUTROABS 6.9  --   --   --   --   HGB 10.8* 9.7* 10.2* 11.6* 11.6*  HCT 36.3* 31.5* 33.6* 38.1* 38.2*  MCV 99.2 97.5 98.8 97.7 98.2  PLT 234 225 197 228 180   Cardiac Enzymes: No results for input(s): CKTOTAL, CKMB, CKMBINDEX, TROPONINI in the last 168 hours. CBG (last 3)  Recent Labs    07/31/17 1709 08/02/17 1132 08/02/17 1532  GLUCAP 102* 105* 148*   No results found for this or any previous visit (from the past 240 hour(s)).   Studies: Dg Abd 1 View  Result Date: 08/02/2017 CLINICAL DATA:  76 year old male status post NG tube placement. Partially calcified mesenteric mass. EXAM: ABDOMEN - 1 VIEW COMPARISON:  CT Abdomen and Pelvis 07/28/2017 FINDINGS: Portable AP supine view at 0915 hours. Enteric tube courses to the abdomen and side hole is at the level of the proximal gastric body (arrow). Paucity bowel gas otherwise. Coarse calcifications in the left abdomen correspond to nephrolithiasis. Coarse calcifications in the right upper quadrant correspond a right upper pole nephrolithiasis. Negative lung bases. Endplate degeneration in the spine. No acute osseous abnormality identified. IMPRESSION: 1. NG tube side hole at the level of the proximal gastric body. 2. Nephrolithiasis.  Paucity of bowel gas. Electronically Signed   By: Genevie Ann M.D.   On: 08/02/2017 09:38   Scheduled Meds: . metoprolol tartrate  2.5 mg Intravenous Q6H  . sodium chloride flush  3 mL Intravenous Q12H   Continuous Infusions: . sodium chloride 100 mL/hr at 08/03/17 0126    Principal Problem:   Bowel obstruction (HCC) Active Problems:   Essential hypertension   Hyperlipidemia   Coronary artery disease involving native coronary artery of native heart with angina pectoris (HCC)   CKD (chronic kidney disease) stage 3, GFR 30-59 ml/min (HCC)   PAF (paroxysmal atrial  fibrillation) (HCC)   Intractable nausea and vomiting   Acute on chronic renal failure (HCC)   Gastric outlet obstruction   Neoplasm of uncertain behavior of mesentery   Preoperative cardiovascular examination  Time spent:   Irwin Brakeman, MD, FAAFP [facility] Pager [phone]  If 7PM-7AM, please contact night-coverage [email or web address] Password [email or web address] 08/03/2017, 10:24 AM    LOS: 6 days

## 2017-08-03 NOTE — Progress Notes (Signed)
After speaking with Dr. Hal Hope, he stated he would review the chart to see about ordering another dose of Solumedrol.  New order for Solumedrol x1 for pts R knee pt.

## 2017-08-03 NOTE — Addendum Note (Signed)
Addendum  created 08/03/17 1408 by Vista Deck, CRNA   Sign clinical note

## 2017-08-04 DIAGNOSIS — K5669 Other partial intestinal obstruction: Secondary | ICD-10-CM

## 2017-08-04 DIAGNOSIS — N170 Acute kidney failure with tubular necrosis: Secondary | ICD-10-CM

## 2017-08-04 LAB — CBC
HCT: 32.2 % — ABNORMAL LOW (ref 39.0–52.0)
Hemoglobin: 9.7 g/dL — ABNORMAL LOW (ref 13.0–17.0)
MCH: 29.8 pg (ref 26.0–34.0)
MCHC: 30.1 g/dL (ref 30.0–36.0)
MCV: 98.8 fL (ref 78.0–100.0)
PLATELETS: 157 10*3/uL (ref 150–400)
RBC: 3.26 MIL/uL — ABNORMAL LOW (ref 4.22–5.81)
RDW: 17.9 % — ABNORMAL HIGH (ref 11.5–15.5)
WBC: 8.3 10*3/uL (ref 4.0–10.5)

## 2017-08-04 LAB — BASIC METABOLIC PANEL
Anion gap: 14 (ref 5–15)
BUN: 75 mg/dL — AB (ref 6–20)
CHLORIDE: 102 mmol/L (ref 101–111)
CO2: 25 mmol/L (ref 22–32)
CREATININE: 3.84 mg/dL — AB (ref 0.61–1.24)
Calcium: 8.4 mg/dL — ABNORMAL LOW (ref 8.9–10.3)
GFR calc Af Amer: 16 mL/min — ABNORMAL LOW (ref >60)
GFR, EST NON AFRICAN AMERICAN: 14 mL/min — AB (ref >60)
GLUCOSE: 142 mg/dL — AB (ref 65–99)
Potassium: 4 mmol/L (ref 3.5–5.1)
SODIUM: 141 mmol/L (ref 135–145)

## 2017-08-04 NOTE — Progress Notes (Signed)
[facility] Progress Note  2 Days Post-Op  Subjective: No major issues overnight but did get solumedrol for right knee pain. This is the second dose of steroids in a few days for the knee. Otherwise taking in sips/ clears with he NG in place, likely output from NG only about 1L after subtracting the intake.  None distended, no flatus as of yet. UOP remains marginal at times, foley in place, Cr trending up.   Objective: Vital signs in last 24 hours: Temp:  [97.5 F (36.4 C)-98.2 F (36.8 C)] 98.2 F (36.8 C) (11/14 0503) Pulse Rate:  [73-105] 73 (11/14 0503) Resp:  [16-18] 16 (11/14 0503) BP: (88-119)/(43-60) 116/43 (11/14 0503) SpO2:  [92 %-100 %] 92 % (11/14 0802) Weight:  [176 lb 5.9 oz (80 kg)] 176 lb 5.9 oz (80 kg) (11/14 0500) Last BM Date: 08/01/17  Intake/Output from previous day: 11/13 0701 - 11/14 0700 In: 2769.7 [P.O.:960; I.V.:1809.7] Out: 2950 [Urine:400; Emesis/NG output:2550] Intake/Output this shift: Total I/O In: 1061.7 [P.O.:360; I.V.:701.7] Out: 1350 [Urine:250; Emesis/NG output:1100]  General appearance: alert, cooperative and no distress Resp: normal work breathing GI: soft, appropriately tender, nondistended, staples c/d/i with no erythema or drainage Extremities: extremities normal, atraumatic, no cyanosis or edema  Lab Results:  Recent Labs    08/03/17 0504 08/04/17 0427  WBC 9.7 8.3  HGB 11.6* 9.7*  HCT 38.2* 32.2*  PLT 180 157   BMET Recent Labs    08/03/17 0504 08/04/17 0427  NA 140 141  K 3.8 4.0  CL 101 102  CO2 26 25  GLUCOSE 135* 142*  BUN 63* 75*  CREATININE 2.54* 3.84*  CALCIUM 8.6* 8.4*    Anti-infectives: Anti-infectives (From admission, onward)   Start     Dose/Rate Route Frequency Ordered Stop   08/02/17 1300  ceFAZolin (ANCEF) IVPB 2g/100 mL premix     2 g 200 mL/hr over 30 Minutes Intravenous On call to O.R. 08/02/17 1253 08/02/17 1400   07/30/17 0900  ceFAZolin (ANCEF) IVPB 2g/100 mL premix     2 g 200 mL/hr over 30 Minutes Intravenous On call to O.R. 07/30/17 0858 07/30/17 1029      Assessment/Plan: Mr. 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 Doing fair. No distention and does not want to have to get the NG replaced. -PRN For pain -IS, OOB -Ambulate if possible -NG to foley bag, can continue to do sips/ chips but if gets bloated will need to put NG back to suction  -IVF, Cr trending up in the setting of CKD, will discuss with Dr. Jerilee Hoh and determine if need to get nephrology involved versus monitoring, FeNA might be helpful  -H&H down but no signs of bleeding, likely equilibration, monitor labs  -Lovenox redosed yesterday     LOS: 7 days    Virl Cagey 08/04/2017

## 2017-08-04 NOTE — Plan of Care (Signed)
Patient was able to get up from the bed and sit in the chair for a couple of hours today. Urine output for my shift has been around 351ml. Placed NG tube to gravity and put out about 729ml from 10:30am to 7pm.

## 2017-08-04 NOTE — Progress Notes (Signed)
PROGRESS NOTE    Stephen [name] [facility]  IRS:[id number] DOB: October 22, 1940 DOA: 07/28/2017 PCP: [name], MD     Brief Narrative:  76 year old man admitted from home on 11/7 due to nausea and vomiting.  History of atrial fibrillation, stroke, stage III chronic kidney disease.  CT abdomen and pelvis on admission showed an abdominal mass bowel obstruction.   Assessment & Plan:   Principal Problem:   Bowel obstruction (HCC) Active Problems:   Essential hypertension   Hyperlipidemia   Coronary artery disease involving native coronary artery of native heart with angina pectoris (HCC)   CKD (chronic kidney disease) stage 3, GFR 30-59 ml/min (HCC)   PAF (paroxysmal atrial fibrillation) (HCC)   Intractable nausea and vomiting   Acute on chronic renal failure (HCC)   Gastric outlet obstruction   Neoplasm of uncertain behavior of mesentery   Preoperative cardiovascular examination   Mesenteric mass with bowel obstruction -Status post resection by Dr. Constance Haw on 11/13 with gastrojejunostomy. -Pathology is currently pending, suspicion is for carcinoid. -Oncology is on board, plan to set patient up for outpatient PET scan and further outpatient follow-up.  This has been scheduled for 11/26.  Acute on chronic kidney disease stage III -With increase of creatinine above baseline.  Baseline around 1.9-2.5, up to 3.8 today. -Will increase IV fluids today, if no improvement in renal function tomorrow will consider nephrology consultation.  Hyperkalemia -Resolved  Chronic A. Fib -Rate controlled -Anticoagulation on hold due to recent surgery.  Type 2 diabetes -Fair control, continue current regimen.  Hyperlipidemia -Statin currently on hold [place].  Right knee pain -Likely due to gout. -Significantly improved after 2 doses of Solu-Medrol given on 11/10 and 11/13. -Dr. Constance Haw would like to hold on further steroid usage so as to not impair bowel healing.  DVT prophylaxis:  lovenox Code Status: full code Family Communication: Wife and nephew at bedside updated on plan of care and all questions answered Disposition Plan: Pending medical stability  Consultants:   Surgery  Procedures:   Ex lap, gastrojejunostomy, liver biopsy of nodules, small bowel resection of ischemic bowel from the mesenteric mass on 11/12  Antimicrobials:  Anti-infectives (From admission, onward)   Start     Dose/Rate Route Frequency Ordered Stop   08/02/17 1300  ceFAZolin (ANCEF) IVPB 2g/100 mL premix     2 g 200 mL/hr over 30 Minutes Intravenous On call to O.R. 08/02/17 1253 08/02/17 1400   07/30/17 0900  ceFAZolin (ANCEF) IVPB 2g/100 mL premix     2 g 200 mL/hr over 30 Minutes Intravenous On call to O.R. 07/30/17 0858 07/30/17 1029       Subjective: Feels improved, complains of abdominal soreness, no nausea  Objective: Vitals:   08/04/17 0500 08/04/17 0503 08/04/17 0802 08/04/17 1324  BP:  (!) 116/43  (!) 137/59  Pulse:  73  81  Resp:  16  16  Temp:  98.2 F (36.8 C)  98.2 F (36.8 C)  TempSrc:  Oral  Oral  SpO2:  96% 92% 97%  Weight: 80 kg (176 lb 5.9 oz)     Height:        Intake/Output Summary (Last 24 hours) at 08/04/2017 1738 Last data filed at 08/04/2017 1032 Gross per 24 hour  Intake 3228.34 ml  Output 3400 ml  Net -171.66 ml   Filed Weights   08/02/17 1153 08/03/17 0500 08/04/17 0500  Weight: 84.8 kg (187 lb) 81.5 kg (179 lb 10.8 oz) 80 kg (176 lb 5.9  oz)    Examination:  General exam: Alert, awake, oriented x 3, NG tube in place Respiratory system: Clear to auscultation. Respiratory effort normal. Cardiovascular system:RRR. No murmurs, rubs, gallops. Gastrointestinal system: Abdomen is nondistended, soft and slightly tender to palpation.  Hypoactive bowel sounds  Central nervous system: Alert and oriented. No focal neurological deficits. Extremities: No C/C/E, +pedal pulses Skin: No rashes, lesions or ulcers Psychiatry: Judgement and insight  appear normal. Mood & affect appropriate.     Data Reviewed: I have personally reviewed following labs and imaging studies  CBC: Recent Labs  Lab 07/29/17 0416 08/01/17 0653 08/02/17 0625 08/03/17 0504 08/04/17 0427  WBC 9.0 4.6 8.6 9.7 8.3  HGB 9.7* 10.2* 11.6* 11.6* 9.7*  HCT 31.5* 33.6* 38.1* 38.2* 32.2*  MCV 97.5 98.8 97.7 98.2 98.8  PLT 225 197 228 180 297   Basic Metabolic Panel: Recent Labs  Lab 07/29/17 0416 08/01/17 0653 08/02/17 0625 08/03/17 0504 08/04/17 0427  NA 139 140 138 140 141  K 4.5 4.5 3.8 3.8 4.0  CL 104 107 100* 101 102  CO2 23 20* 26 26 25   GLUCOSE 95 118* 119* 135* 142*  BUN 48* 48* 55* 63* 75*  CREATININE 2.68* 1.70* 1.95* 2.54* 3.84*  CALCIUM 8.6* 9.1 9.4 8.6* 8.4*   GFR: Estimated Creatinine Clearance: 18 mL/min (A) (by C-G formula based on SCr of 3.84 mg/dL (H)). Liver Function Tests: No results for input(s): AST, ALT, ALKPHOS, BILITOT, PROT, ALBUMIN in the last 168 hours. Recent Labs  Lab 07/28/17 1739  LIPASE 26   No results for input(s): AMMONIA in the last 168 hours. Coagulation Profile: Recent Labs  Lab 07/30/17 1554  INR 1.35   Cardiac Enzymes: No results for input(s): CKTOTAL, CKMB, CKMBINDEX, TROPONINI in the last 168 hours. BNP (last 3 results) No results for input(s): PROBNP in the last 8760 hours. HbA1C: No results for input(s): HGBA1C in the last 72 hours. CBG: Recent Labs  Lab 07/31/17 0734 07/31/17 1101 07/31/17 1709 08/02/17 1132 08/02/17 1532  GLUCAP 87 84 102* 105* 148*   Lipid Profile: No results for input(s): CHOL, HDL, LDLCALC, TRIG, CHOLHDL, LDLDIRECT in the last 72 hours. Thyroid Function Tests: No results for input(s): TSH, T4TOTAL, FREET4, T3FREE, THYROIDAB in the last 72 hours. Anemia Panel: No results for input(s): VITAMINB12, FOLATE, FERRITIN, TIBC, IRON, RETICCTPCT in the last 72 hours. Urine analysis:    Component Value Date/Time   COLORURINE YELLOW 07/28/2017 1511   APPEARANCEUR  HAZY (A) 07/28/2017 1511   LABSPEC 1.017 07/28/2017 1511   PHURINE 5.0 07/28/2017 1511   GLUCOSEU NEGATIVE 07/28/2017 1511   HGBUR MODERATE (A) 07/28/2017 1511   BILIRUBINUR NEGATIVE 07/28/2017 1511   KETONESUR NEGATIVE 07/28/2017 1511   PROTEINUR NEGATIVE 07/28/2017 1511   UROBILINOGEN 0.2 07/05/2008 0922   NITRITE NEGATIVE 07/28/2017 1511   LEUKOCYTESUR SMALL (A) 07/28/2017 1511   Sepsis Labs: @LABRCNTIP (procalcitonin:4,lacticidven:4)  )No results found for this or any previous visit (from the past 240 hour(s)).       Radiology Studies: No results found.      Scheduled Meds: . enoxaparin (LOVENOX) injection  1 mg/kg Subcutaneous Q24H  . metoprolol tartrate  2.5 mg Intravenous Q6H  . sodium chloride flush  3 mL Intravenous Q12H   Continuous Infusions: . sodium chloride 125 mL/hr at 08/04/17 1343     LOS: 7 days    Time spent: 35 minutes. Greater than 50% of this time was spent in direct contact with the patient coordinating care.  Lelon Frohlich, MD [facility] Pager [phone]  If 7PM-7AM, please contact night-coverage [email or web address] Password [facility] 08/04/2017, 5:38 PM

## 2017-08-05 ENCOUNTER — Encounter (HOSPITAL_COMMUNITY): Payer: Self-pay | Admitting: General Surgery

## 2017-08-05 ENCOUNTER — Inpatient Hospital Stay (HOSPITAL_COMMUNITY): Payer: PPO

## 2017-08-05 LAB — BASIC METABOLIC PANEL
Anion gap: 13 (ref 5–15)
BUN: 88 mg/dL — ABNORMAL HIGH (ref 6–20)
CHLORIDE: 101 mmol/L (ref 101–111)
CO2: 29 mmol/L (ref 22–32)
Calcium: 8.4 mg/dL — ABNORMAL LOW (ref 8.9–10.3)
Creatinine, Ser: 3.95 mg/dL — ABNORMAL HIGH (ref 0.61–1.24)
GFR, EST AFRICAN AMERICAN: 16 mL/min — AB (ref >60)
GFR, EST NON AFRICAN AMERICAN: 13 mL/min — AB (ref >60)
Glucose, Bld: 125 mg/dL — ABNORMAL HIGH (ref 65–99)
POTASSIUM: 3.7 mmol/L (ref 3.5–5.1)
SODIUM: 143 mmol/L (ref 135–145)

## 2017-08-05 LAB — BPAM RBC
BLOOD PRODUCT EXPIRATION DATE: 201812062359
Blood Product Expiration Date: 201812062359
UNIT TYPE AND RH: 5100
Unit Type and Rh: 5100

## 2017-08-05 LAB — TYPE AND SCREEN
ABO/RH(D): O POS
ANTIBODY SCREEN: NEGATIVE
UNIT DIVISION: 0
Unit division: 0

## 2017-08-05 MED ORDER — FUROSEMIDE 10 MG/ML IJ SOLN
80.0000 mg | Freq: Two times a day (BID) | INTRAMUSCULAR | Status: DC
  Administered 2017-08-05: 80 mg via INTRAVENOUS
  Filled 2017-08-05: qty 8

## 2017-08-05 NOTE — Progress Notes (Signed)
[place] for Lovenox Indication: atrial fibrillation  No Known Allergies  Patient Measurements: Height: 6' (182.9 cm) Weight: 182 lb 15.7 oz (83 kg) IBW/kg (Calculated) : 77.6  Vital Signs: Temp: 98.3 F (36.8 C) (11/15 0629) Temp Source: Oral (11/15 0629) BP: 131/52 (11/15 0629) Pulse Rate: 73 (11/15 0629)  Labs: Recent Labs    08/03/17 0504 08/04/17 0427 08/05/17 0507  HGB 11.6* 9.7*  --   HCT 38.2* 32.2*  --   PLT 180 157  --   CREATININE 2.54* 3.84* 3.95*   Estimated Creatinine Clearance: 17.5 mL/min (A) (by C-G formula based on SCr of 3.95 mg/dL (H)).  Assessment: Stephen [name] is 76 yo male with a. Fib, CKD who presented to ED with nausea/vomiting. With CrCl will only dose lovenox daily, Eliquis PTA.  S/P 11/12: Exploratory laparotomy, Small bowel resection (~60cm) and primary side to side staple anastomosis,Palliative Gastrojejunostomy (Stapled), Liver biopsy of nodule   Goal of Therapy:  Monitor platelets by anticoagulation protocol: Yes   Plan:  Lovenox 1mg /kg = 80mg  q24h F/U plan and transition back to eliquis as indicated Monitor for S/S of bleeding CBC in AM  Pricilla Larsson, RPH  08/05/2017,10:26 AM

## 2017-08-05 NOTE — Progress Notes (Signed)
[facility] Progress Note  3 Days Post-Op  Subjective: No nausea/vomiting, NG with bilious output to gravity. Nondistended. Has been up to the chair but not ambulated. Cr trending up. Nephrology consulted this AM.   Objective: Vital signs in last 24 hours: Temp:  [97.7 F (36.5 C)-98.3 F (36.8 C)] 98.3 F (36.8 C) (11/15 0629) Pulse Rate:  [73-86] 73 (11/15 0629) Resp:  [16] 16 (11/15 0629) BP: (108-137)/(52-63) 131/52 (11/15 0629) SpO2:  [94 %-98 %] 94 % (11/15 0846) Weight:  [182 lb 15.7 oz (83 kg)] 182 lb 15.7 oz (83 kg) (11/15 0629) Last BM Date: 08/01/17  Intake/Output from previous day: 11/14 0701 - 11/15 0700 In: 4087.1 [P.O.:1060; I.V.:3027.1] Out: 2150 [Urine:350; Emesis/NG output:1800] Intake/Output this shift: No intake/output data recorded.  General appearance: alert, cooperative and no distress Resp: normal work breathing GI: soft, nondistended, appropriately tender, incision c/d/i with staples, no erythema or drainage Extremities: extremities normal, atraumatic, no cyanosis or edema  Lab Results:  Recent Labs    08/03/17 0504 08/04/17 0427  WBC 9.7 8.3  HGB 11.6* 9.7*  HCT 38.2* 32.2*  PLT 180 157   BMET Recent Labs    08/04/17 0427 08/05/17 0507  NA 141 143  K 4.0 3.7  CL 102 101  CO2 25 29  GLUCOSE 142* 125*  BUN 75* 88*  CREATININE 3.84* 3.95*  CALCIUM 8.4* 8.4*    Anti-infectives: Anti-infectives (From admission, onward)   Start     Dose/Rate Route Frequency Ordered Stop   08/02/17 1300  ceFAZolin (ANCEF) IVPB 2g/100 mL premix     2 g 200 mL/hr over 30 Minutes Intravenous On call to O.R. 08/02/17 1253 08/02/17 1400   07/30/17 0900  ceFAZolin (ANCEF) IVPB 2g/100 mL premix     2 g 200 mL/hr over 30 Minutes Intravenous On call to O.R. 07/30/17 0858 07/30/17 1029      Assessment/Plan: Mr. Hagemann is a 76 yo with a gastric outlet obstruction and mesenteric mass, likely carcinoid, POD 3 s/p Ex lap, gastrojejunostomy,  liver biopsies of nodules, small bowel resection of ischemic bowel from the mesenteric mass. Ng to gravity yesterday and no nausea/vomiting but continuous to have bilious output. -PRN For pain -IS, OOB -Ambulate today -NG to foley bag and will get Xray abd to see if ileus/ gastric distention  -Nephrology called for acute on chronic kidney failure  -Lovenox per pharmacy for A fib    LOS: 8 days    Virl Cagey 08/05/2017

## 2017-08-05 NOTE — Care Management Note (Signed)
Case Management Note  Patient Details  Name: Stephen [name] MRN: [id number] Date of Birth: 01-Mar-1941   If discussed at [place] Length of Stay Meetings, dates discussed:  08/05/2017  Additional Comments:  Destry Dauber, Chauncey Reading, RN 08/05/2017, 1:56 PM

## 2017-08-05 NOTE — Progress Notes (Signed)
Central Telemetry called this nurse. States patient had 31 run of V-tach at [employer]. Patient states he felt his heart beat in his neck. MD notified.

## 2017-08-05 NOTE — Progress Notes (Signed)
Patient educated on importance of walking after abdominal surgery. Patient states "I am worn out today", "I had a x-ray and ultrasound and I am just worn out".

## 2017-08-05 NOTE — Progress Notes (Signed)
PROGRESS NOTE    Stephen [name] [facility]  WIO:[id number] DOB: 05/08/1941 DOA: 07/28/2017 PCP: [name], MD     Brief Narrative:  76 year old man admitted from home on 11/7 due to nausea and vomiting.  History of atrial fibrillation, stroke, stage III chronic kidney disease.  CT abdomen and pelvis on admission showed an abdominal mass bowel obstruction.  As of 1115 still no return of bowel function, NG remains in place with bilious output.  Creatinine continues to rise.   Assessment & Plan:   Principal Problem:   Bowel obstruction (HCC) Active Problems:   Essential hypertension   Hyperlipidemia   Coronary artery disease involving native coronary artery of native heart with angina pectoris (HCC)   CKD (chronic kidney disease) stage 3, GFR 30-59 ml/min (HCC)   PAF (paroxysmal atrial fibrillation) (HCC)   Intractable nausea and vomiting   Acute on chronic renal failure (HCC)   Gastric outlet obstruction   Neoplasm of uncertain behavior of mesentery   Preoperative cardiovascular examination   Mesenteric mass with bowel obstruction -Status post resection by Dr. Constance Haw on 11/13 with gastrojejunostomy. -Pathology is currently pending, suspicion is for carcinoid. -Oncology is on board, plan to set patient up for outpatient PET scan and further outpatient follow-up.  This has been scheduled for 11/26. -Still no return of bowel function.  Acute on chronic kidney disease stage III -With increase of creatinine above baseline.  Baseline around 1.9-2.5, up to 3.95 today. -Nephrology consultation requested.  Hyperkalemia -Resolved  Chronic A. Fib -Rate controlled -Anticoagulation on hold due to recent surgery.  Type 2 diabetes -Fair control, continue current regimen.  Hyperlipidemia -Statin currently on hold due to [place].  Right knee pain -Likely due to gout. -Significantly improved after 2 doses of Solu-Medrol given on 11/10 and 11/13.   DVT prophylaxis: lovenox Code  Status: full code Family Communication: Daughter at bedside  Disposition Plan: Pending medical stability  Consultants:   Surgery  Procedures:   Ex lap, gastrojejunostomy, liver biopsy of nodules, small bowel resection of ischemic bowel from the mesenteric mass on 11/12  Antimicrobials:  Anti-infectives (From admission, onward)   Start     Dose/Rate Route Frequency Ordered Stop   08/02/17 1300  ceFAZolin (ANCEF) IVPB 2g/100 mL premix     2 g 200 mL/hr over 30 Minutes Intravenous On call to O.R. 08/02/17 1253 08/02/17 1400   07/30/17 0900  ceFAZolin (ANCEF) IVPB 2g/100 mL premix     2 g 200 mL/hr over 30 Minutes Intravenous On call to O.R. 07/30/17 0858 07/30/17 1029       Subjective: Complains of mild abdominal soreness, no nausea or vomiting overnight, no stomach rumbling or flatus or bowel movements as of yet.  Objective: Vitals:   08/04/17 1956 08/04/17 2155 08/05/17 0629 08/05/17 0846  BP:  (!) 128/54 (!) 131/52   Pulse:  75 73   Resp:  16 16   Temp:  97.7 F (36.5 C) 98.3 F (36.8 C)   TempSrc:  Oral Oral   SpO2: 94% 97% 98% 94%  Weight:   83 kg (182 lb 15.7 oz)   Height:        Intake/Output Summary (Last 24 hours) at 08/05/2017 1505 Last data filed at 08/05/2017 1158 Gross per 24 hour  Intake 3771.25 ml  Output 800 ml  Net 2971.25 ml   Filed Weights   08/03/17 0500 08/04/17 0500 08/05/17 0629  Weight: 81.5 kg (179 lb 10.8 oz) 80 kg (176 lb 5.9 oz) 83  kg (182 lb 15.7 oz)    Examination:  General exam: Alert, awake, oriented x 3, NG tube in place Respiratory system: Clear to auscultation. Respiratory effort normal. Cardiovascular system:RRR. No murmurs, rubs, gallops. Gastrointestinal system: Abdomen is nondistended, soft and slightly tender to palpation.  Hypoactive bowel sounds  Central nervous system: Alert and oriented. No focal neurological deficits. Extremities: No C/C/E, +pedal pulses Skin: No rashes, lesions or ulcers Psychiatry: Judgement  and insight appear normal. Mood & affect appropriate.     Data Reviewed: I have personally reviewed following labs and imaging studies  CBC: Recent Labs  Lab 08/01/17 0653 08/02/17 0625 08/03/17 0504 08/04/17 0427  WBC 4.6 8.6 9.7 8.3  HGB 10.2* 11.6* 11.6* 9.7*  HCT 33.6* 38.1* 38.2* 32.2*  MCV 98.8 97.7 98.2 98.8  PLT 197 228 180 875   Basic Metabolic Panel: Recent Labs  Lab 08/01/17 0653 08/02/17 0625 08/03/17 0504 08/04/17 0427 08/05/17 0507  NA 140 138 140 141 143  K 4.5 3.8 3.8 4.0 3.7  CL 107 100* 101 102 101  CO2 20* 26 26 25 29   GLUCOSE 118* 119* 135* 142* 125*  BUN 48* 55* 63* 75* 88*  CREATININE 1.70* 1.95* 2.54* 3.84* 3.95*  CALCIUM 9.1 9.4 8.6* 8.4* 8.4*   GFR: Estimated Creatinine Clearance: 17.5 mL/min (A) (by C-G formula based on SCr of 3.95 mg/dL (H)). Liver Function Tests: No results for input(s): AST, ALT, ALKPHOS, BILITOT, PROT, ALBUMIN in the last 168 hours. No results for input(s): LIPASE, AMYLASE in the last 168 hours. No results for input(s): AMMONIA in the last 168 hours. Coagulation Profile: Recent Labs  Lab 07/30/17 1554  INR 1.35   Cardiac Enzymes: No results for input(s): CKTOTAL, CKMB, CKMBINDEX, TROPONINI in the last 168 hours. BNP (last 3 results) No results for input(s): PROBNP in the last 8760 hours. HbA1C: No results for input(s): HGBA1C in the last 72 hours. CBG: Recent Labs  Lab 07/31/17 0734 07/31/17 1101 07/31/17 1709 08/02/17 1132 08/02/17 1532  GLUCAP 87 84 102* 105* 148*   Lipid Profile: No results for input(s): CHOL, HDL, LDLCALC, TRIG, CHOLHDL, LDLDIRECT in the last 72 hours. Thyroid Function Tests: No results for input(s): TSH, T4TOTAL, FREET4, T3FREE, THYROIDAB in the last 72 hours. Anemia Panel: No results for input(s): VITAMINB12, FOLATE, FERRITIN, TIBC, IRON, RETICCTPCT in the last 72 hours. Urine analysis:    Component Value Date/Time   COLORURINE YELLOW 07/28/2017 1511   APPEARANCEUR HAZY (A)  07/28/2017 1511   LABSPEC 1.017 07/28/2017 1511   PHURINE 5.0 07/28/2017 1511   GLUCOSEU NEGATIVE 07/28/2017 1511   HGBUR MODERATE (A) 07/28/2017 1511   BILIRUBINUR NEGATIVE 07/28/2017 1511   KETONESUR NEGATIVE 07/28/2017 1511   PROTEINUR NEGATIVE 07/28/2017 1511   UROBILINOGEN 0.2 07/05/2008 0922   NITRITE NEGATIVE 07/28/2017 1511   LEUKOCYTESUR SMALL (A) 07/28/2017 1511   Sepsis Labs: @LABRCNTIP (procalcitonin:4,lacticidven:4)  )No results found for this or any previous visit (from the past 240 hour(s)).       Radiology Studies: US Renal  Result Date: 08/05/2017 CLINICAL DATA:  Acute renal disease superimposed on chronic renal disease. EXAM: RENAL / URINARY TRACT ULTRASOUND COMPLETE COMPARISON:  Abdominal series 11 08/2017. CT 07/28/2017. Ultrasound 03/02/2017 . FINDINGS: Right Kidney: Length: 10.5 cm. Echogenicity within normal limits. Cortical irregularity suggesting scarring . No mass or hydronephrosis visualized. Left Kidney: Length: 11.4 cm. Echogenicity within normal limits. Cortical irregularity suggesting scarring . No mass or hydronephrosis visualized. 13 mm nonobstructing stone. Bladder: A Foley catheter noted.  Bladder  is nondistended. Incidental note is made of increased hepatic echogenicity consistent fatty infiltration and/or hepatocellular disease. Mild ascites incidentally noted. IMPRESSION: 1.  Bilateral renal cortical irregularity suggesting scarring. 2. 13 mm nonobstructing left renal stone. No acute renal abnormality identified. No hydronephrosis. 3. Incidental note is made of increased hepatic echogenicity consistent fatty infiltration and/or hepatocellular disease. Mild ascites is incidentally noted . Electronically Signed   By: Marcello Moores  Register   On: 08/05/2017 12:58        Scheduled Meds: . enoxaparin (LOVENOX) injection  1 mg/kg Subcutaneous Q24H  . furosemide  80 mg Intravenous BID  . metoprolol tartrate  2.5 mg Intravenous Q6H  . sodium chloride flush   3 mL Intravenous Q12H   Continuous Infusions: . sodium chloride 135 mL/hr at 08/05/17 1158     LOS: 8 days    Time spent: 25 minutes. Greater than 50% of this time was spent in direct contact with the patient coordinating care.     Lelon Frohlich, MD [facility] Pager [phone]  If 7PM-7AM, please contact night-coverage [email or web address] Password [email or web address] 08/05/2017, 3:05 PM

## 2017-08-05 NOTE — Consult Note (Signed)
Reason for Consult: Acute kidney injury superimposed on chronic Referring Physician: Dr. [name] is an 76 y.o. male.  HPI: He is a patient who has history of CVA, diabetes, hypertension, atrial fibrillation, chronic renal failure stage IV presently came with gastric outlet obstruction.  Presently he is status post exploratory laparotomy, gastrojejunostomy, small bowel ischemia status post resection.  Consult is called because of worsening of renal failure.  Presently patient offers no complaints.  He denies any difficulty breathing.  He does not have any nausea or abdominal pain.  Overall he feels okay.  Past Medical History:  Diagnosis Date  . Anemia of chronic renal failure, stage 3 (moderate) (HCC)   . Atrial fibrillation ([place])    Documented 08/2016  . B12 deficiency 08/17/2016  . Carotid artery occlusion    Right carotid endarterectomy 2001; left carotid endarterectomy in 2008  . Celiac disease   . Chronic renal disease, stage 3, moderately decreased glomerular filtration rate between 30-59 mL/min/1.73 square meter ([place]) 06/01/2012  . Coronary atherosclerosis of native coronary artery    PTCA of LAD in 1994; total obstruction of the RCA treated medically; subsequent stress nuclear study with inferior ischemia  . DDD (degenerative disc disease), lumbar    Lumbar surgery in 1984  . Diabetes mellitus type II   . Essential hypertension   . History of stroke    Right brain 2001; right PCA, MCA/PCA, left SCA infarcts 08/2017 with atrial fibrillation  . Hyperlipidemia   . Iron deficiency anemia 12/31/2016  . Peripheral vascular disease [facility])     Past Surgical History:  Procedure Laterality Date  . APPENDECTOMY  1970  . BOWEL RESECTION  08/02/2017   Procedure: SMALL BOWEL RESECTION;  Surgeon: Virl Cagey, MD;  Location: [facility] ORS;  Service: General;;  . CAROTID ENDARTERECTOMY  2008   Left   . CAROTID ENDARTERECTOMY  06/15/2000   Right  . COLONOSCOPY N/A 10/10/2015    Procedure: COLONOSCOPY;  Surgeon: Rogene Houston, MD;  Location: [facility] ENDO SUITE;  Service: Endoscopy;  Laterality: N/A;  12:25 - moved to 11:15 - Ann to notify  . GASTROJEJUNOSTOMY  08/02/2017   Procedure: GASTROJEJUNOSTOMY;  Surgeon: Virl Cagey, MD;  Location: [facility] ORS;  Service: General;;  . LIVER BIOPSY  08/02/2017   Procedure: LIVER BIOPSY;  Surgeon: Virl Cagey, MD;  Location: [facility] ORS;  Service: General;;  . LUMBAR [place] SURGERY  2000, 2001   L5 HNP required 2 surgical procedures    Family History  Problem Relation Age of Onset  . Heart disease Mother        Before age 89  . Diabetes Mother   . Heart attack Mother   . Diabetes Sister   . Heart disease Brother   . Diabetes Brother   . Diabetes Daughter   . Diabetes Son   . Hyperlipidemia Son     Social History:  reports that he quit smoking about 44 years ago. His smoking use included cigarettes. He has a 30.00 pack-year smoking history. he has never used smokeless tobacco. He reports that he does not drink alcohol or use drugs.  Allergies: No Known Allergies  Medications: I have reviewed the patient's current medications.  Results for orders placed or performed during the hospital encounter of 07/28/17 (from the past 48 hour(s))  CBC     Status: Abnormal   Collection Time: 08/04/17  4:27 AM  Result Value Ref Range   WBC 8.3 4.0 - 10.5 K/uL  RBC 3.26 (L) 4.22 - 5.81 MIL/uL   Hemoglobin 9.7 (L) 13.0 - 17.0 g/dL   HCT 32.2 (L) 39.0 - 52.0 %   MCV 98.8 78.0 - 100.0 fL   MCH 29.8 26.0 - 34.0 pg   MCHC 30.1 30.0 - 36.0 g/dL   RDW 17.9 (H) 11.5 - 15.5 %   Platelets 157 150 - 400 K/uL  Basic metabolic panel     Status: Abnormal   Collection Time: 08/04/17  4:27 AM  Result Value Ref Range   Sodium 141 135 - 145 mmol/L   Potassium 4.0 3.5 - 5.1 mmol/L   Chloride 102 101 - 111 mmol/L   CO2 25 22 - 32 mmol/L   Glucose, Bld 142 (H) 65 - 99 mg/dL   BUN 75 (H) 6 - 20 mg/dL   Creatinine, Ser 3.84 (H) 0.61 - 1.24  mg/dL    Comment: DELTA CHECK NOTED   Calcium 8.4 (L) 8.9 - 10.3 mg/dL   GFR calc non Af Amer 14 (L) >60 mL/min   GFR calc Af Amer 16 (L) >60 mL/min    Comment: (NOTE) The eGFR has been calculated using the CKD EPI equation. This calculation has not been validated in all clinical situations. eGFR's persistently <60 mL/min signify possible Chronic Kidney Disease.    Anion gap 14 5 - 15  Basic metabolic panel     Status: Abnormal   Collection Time: 08/05/17  5:07 AM  Result Value Ref Range   Sodium 143 135 - 145 mmol/L   Potassium 3.7 3.5 - 5.1 mmol/L   Chloride 101 101 - 111 mmol/L   CO2 29 22 - 32 mmol/L   Glucose, Bld 125 (H) 65 - 99 mg/dL   BUN 88 (H) 6 - 20 mg/dL   Creatinine, Ser 3.95 (H) 0.61 - 1.24 mg/dL   Calcium 8.4 (L) 8.9 - 10.3 mg/dL   GFR calc non Af Amer 13 (L) >60 mL/min   GFR calc Af Amer 16 (L) >60 mL/min    Comment: (NOTE) The eGFR has been calculated using the CKD EPI equation. This calculation has not been validated in all clinical situations. eGFR's persistently <60 mL/min signify possible Chronic Kidney Disease.    Anion gap 13 5 - 15    No results found.  Review of Systems  Constitutional: Negative for chills and fever.  Respiratory: Negative for shortness of breath.   Cardiovascular: Negative for orthopnea and leg swelling.  Gastrointestinal: Negative for abdominal pain and nausea.   Blood pressure (!) 131/52, pulse 73, temperature 98.3 F (36.8 C), temperature source Oral, resp. rate 16, height 6' (1.829 m), weight 83 kg (182 lb 15.7 oz), SpO2 94 %. Physical Exam  Constitutional: No distress.  Eyes: No scleral icterus.  Neck: No JVD present.  Cardiovascular: Normal rate and regular rhythm.  No murmur heard. Respiratory: No respiratory distress. He has no wheezes. He has no rales.  GI: He exhibits no distension.  Patient with NG tube He has abdominal surgical clip and no drainage.    Assessment/Plan: Problem #1 acute kidney injury  superimposed on chronic.  Presently his renal function seems to be worsening with poor urine output.  This could be secondary to prerenal versus ATN as patient has episode of hypotension.  Patient however remains asymptomatic. Problem #2 chronic renal failure: Thought to be secondary to diabetes/hypertension/recurrent acute kidney injury/age-related renal function loss.  Patient had ultrasound on 03/02/17 which showed right kidney to be 10.7 left kidney 11.1.  His last EGFR as an outpatient was 24 cc/min hence stage IV. 3] hypertension: Patient has episode of hypotension but his blood pressure presently has improved 4] history of CVA 5] history of diabetes 6 history of atrial fibrillation 7]Anemia: This is thought to be secondary to chronic renal failure and patient is on erythropoietin as an outpatient. 8] history of elevation of kappa and lambda chain with high kappa to lambda ratio.  Patient is being followed by oncology. Plan: Agree with hydration 2 we will start patient on Lasix 80 mg IV twice daily to improve his urine output 3] we will follow his renal panel.  I have discussed with the patient since he has underlying chronic renal failure stage IV at this moment if his renal function decline further without any improvement we may need to do dialysis.  As this morning patient required dialysis.  BEFEKADU,BELAYENH S 08/05/2017, 10:43 AM

## 2017-08-06 ENCOUNTER — Inpatient Hospital Stay (HOSPITAL_COMMUNITY): Payer: PPO

## 2017-08-06 LAB — CBC
HEMATOCRIT: 33.7 % — AB (ref 39.0–52.0)
HEMOGLOBIN: 10.1 g/dL — AB (ref 13.0–17.0)
MCH: 29.8 pg (ref 26.0–34.0)
MCHC: 30 g/dL (ref 30.0–36.0)
MCV: 99.4 fL (ref 78.0–100.0)
Platelets: 149 10*3/uL — ABNORMAL LOW (ref 150–400)
RBC: 3.39 MIL/uL — ABNORMAL LOW (ref 4.22–5.81)
RDW: 18 % — ABNORMAL HIGH (ref 11.5–15.5)
WBC: 6.4 10*3/uL (ref 4.0–10.5)

## 2017-08-06 LAB — RENAL FUNCTION PANEL
ANION GAP: 12 (ref 5–15)
Albumin: 2.6 g/dL — ABNORMAL LOW (ref 3.5–5.0)
BUN: 82 mg/dL — ABNORMAL HIGH (ref 6–20)
CHLORIDE: 100 mmol/L — AB (ref 101–111)
CO2: 28 mmol/L (ref 22–32)
Calcium: 8.1 mg/dL — ABNORMAL LOW (ref 8.9–10.3)
Creatinine, Ser: 3.03 mg/dL — ABNORMAL HIGH (ref 0.61–1.24)
GFR calc non Af Amer: 19 mL/min — ABNORMAL LOW (ref >60)
GFR, EST AFRICAN AMERICAN: 22 mL/min — AB (ref >60)
Glucose, Bld: 111 mg/dL — ABNORMAL HIGH (ref 65–99)
POTASSIUM: 3.3 mmol/L — AB (ref 3.5–5.1)
Phosphorus: 3.7 mg/dL (ref 2.5–4.6)
Sodium: 140 mmol/L (ref 135–145)

## 2017-08-06 MED ORDER — IOPAMIDOL (ISOVUE-300) INJECTION 61%
INTRAVENOUS | Status: AC
  Administered 2017-08-06: 350 mL via NASOGASTRIC
  Filled 2017-08-06: qty 150

## 2017-08-06 MED ORDER — POTASSIUM CHLORIDE IN NACL 20-0.9 MEQ/L-% IV SOLN
INTRAVENOUS | Status: DC
  Administered 2017-08-06 – 2017-08-07 (×2): via INTRAVENOUS

## 2017-08-06 MED ORDER — FUROSEMIDE 10 MG/ML IJ SOLN
40.0000 mg | Freq: Two times a day (BID) | INTRAMUSCULAR | Status: DC
  Administered 2017-08-06 – 2017-08-08 (×4): 40 mg via INTRAVENOUS
  Filled 2017-08-06 (×4): qty 4

## 2017-08-06 MED ORDER — POTASSIUM CHLORIDE 10 MEQ/100ML IV SOLN
10.0000 meq | INTRAVENOUS | Status: AC
  Administered 2017-08-06 (×3): 10 meq via INTRAVENOUS
  Filled 2017-08-06: qty 100

## 2017-08-06 MED ORDER — IOPAMIDOL (ISOVUE-300) INJECTION 61%
INTRAVENOUS | Status: AC
  Filled 2017-08-06: qty 200

## 2017-08-06 NOTE — Progress Notes (Signed)
Nutrition Follow-up   INTERVENTION:  RD will continue to follow diet advancement and care goals   NUTRITION DIAGNOSIS:   Severe Malnutrition(:ongoing.) related to altered GI function(chronic) as evidenced by moderate fat depletion, moderate muscle depletion.  GOAL:   Patient will meet greater than or equal to 90% of their needs   MONITOR:   Diet advancement, I & O's, Labs, Weight trends   ASSESSMENT: Pt assessed by RD during hospitalization on 10/16 and diagnosed with severe malnutrition, chronic diarrhea and additional hx of CAD, PVD, Stroke, B-12 deficiency, iron deficiency anemia, celiac dz, CKD-3. He presents emesis (intactable nausea and vomiting). Likely diagnosis of carcinoid tumor and possible metastasis to lungs. The patient is s/p exploratory lap, gastrojejunostomy (palliative), liver biopsies, small bowel resection (ischemic distal small bowel). His NGT remains in place (draining to foley).  The patient says he has had 3 loose stools today and his wife says his day has been difficult and disappointed about his slow progress.  His wt 187 lb (84.9 kg) on 11/8- currently 184.15 lb/ 83.9 kg. Decrease of 2% in 1 week and significant.  He is feeding himself ice chips and says he is pretending they are corn flakes or honey nut cherrios. Concern for patients delayed nutrient intake following surgery given his severely malnourished state on admission. NPO - day 9. If he continues to be unable to advance his diet may need to consider PN as an option depending on his prognosis and health care goals.  Recent Labs  Lab 08/04/17 0427 08/05/17 0507 08/06/17 0446  NA 141 143 140  K 4.0 3.7 3.3*  CL 102 101 100*  CO2 25 29 28   BUN 75* 88* 82*  CREATININE 3.84* 3.95* 3.03*  CALCIUM 8.4* 8.4* 8.1*  PHOS  --   --  3.7  GLUCOSE 142* 125* 111*  Labs: BUN 82 and Cr 3.03 improved compared to past 2 days.    Meds: lasix -decreased today and NS started with   NUTRITION - FOCUSED PHYSICAL  EXAM:    Most Recent Value  Orbital Region  Mild depletion  Upper Arm Region  Moderate depletion  Buccal Region Moderate depletion  Temple Region  Moderate depletion  Clavicle and Acromion Bone Region  Moderate depletion  Dorsal Hand  Moderate depletion  Posterior Calf Region  Moderate depletion  Edema (RD Assessment)  Mild [right foot]  Hair  Reviewed  Eyes  Reviewed  Mouth  Reviewed  Skin  Reviewed  Nails  Reviewed      Diet Order:  Diet NPO time specified Except for: Sips with Meds, Ice Chips  EDUCATION NEEDS:   No education needs have been identified at this time  Skin:  Skin Assessment: Reviewed RN Assessment  Last BM:  11/16 loose stools x 3 so far today per pt   Height:   Ht Readings from Last 1 Encounters:  08/02/17 6' (1.829 m)    Weight:   Wt Readings from Last 1 Encounters:  08/06/17 184 lb 15.5 oz (83.9 kg)  11/8- 187.2 lb (84.9 kg)  Ideal Body Weight:  80.9 kg  BMI:  Body mass index is 25.09 kg/m.  Estimated Nutritional Needs:   Kcal:  7096-2836   (25-28 kcal/kg adj wt )  Protein:  75-80 gr (0.9 gr/kg)  Fluid:  2.0 liters daily   Colman Cater MS,RD,CSG,LDN Office: [phone] Pager: [phone]

## 2017-08-06 NOTE — Progress Notes (Signed)
[facility] Progress Note  4 Days Post-Op  Subjective: No major issues. Xray yesterday with NG in place, minimal gas distally, and some pneumoperitoneum under the diaphragm. Ng remained in place. UGI obtained today and no filling of the GJ and no signs of leak. This is likely due to edema at the anastomosis. Called pathology and preliminary report of carcinoid, grade to be determined.  Spoke with the patient and his daughter about this result.   Objective: Vital signs in last 24 hours: Temp:  [98 F (36.7 C)-98.1 F (36.7 C)] 98 F (36.7 C) (11/16 0533) Pulse Rate:  [92-98] 98 (11/16 0533) Resp:  [17-18] 18 (11/16 0533) BP: (109-128)/(56-61) 128/56 (11/16 0533) SpO2:  [92 %-97 %] 92 % (11/16 0841) Weight:  [184 lb 15.5 oz (83.9 kg)] 184 lb 15.5 oz (83.9 kg) (11/16 0533) Last BM Date: 08/01/17  Intake/Output from previous day: 11/15 0701 - 11/16 0700 In: 3420.3 [P.O.:240; I.V.:3180.3] Out: 3600 [Urine:3600] Intake/Output this shift: No intake/output data recorded.  General appearance: alert, cooperative and no distress Resp: normal work of breathing GI: soft, nondistended, minimally tender, staples c/d/i without erythema or drainage Extremities: extremities normal, atraumatic, no cyanosis or edema  Lab Results:  Recent Labs    08/04/17 0427 08/06/17 0446  WBC 8.3 6.4  HGB 9.7* 10.1*  HCT 32.2* 33.7*  PLT 157 149*   BMET Recent Labs    08/05/17 0507 08/06/17 0446  NA 143 140  K 3.7 3.3*  CL 101 100*  CO2 29 28  GLUCOSE 125* 111*  BUN 88* 82*  CREATININE 3.95* 3.03*  CALCIUM 8.4* 8.1*    Studies/Results: US Renal  Result Date: 08/05/2017 CLINICAL DATA:  Acute renal disease superimposed on chronic renal disease. EXAM: RENAL / URINARY TRACT ULTRASOUND COMPLETE COMPARISON:  Abdominal series 11 08/2017. CT 07/28/2017. Ultrasound 03/02/2017 . FINDINGS: Right Kidney: Length: 10.5 cm. Echogenicity within normal limits. Cortical irregularity  suggesting scarring . No mass or hydronephrosis visualized. Left Kidney: Length: 11.4 cm. Echogenicity within normal limits. Cortical irregularity suggesting scarring . No mass or hydronephrosis visualized. 13 mm nonobstructing stone. Bladder: A Foley catheter noted.  Bladder is nondistended. Incidental note is made of increased hepatic echogenicity consistent fatty infiltration and/or hepatocellular disease. Mild ascites incidentally noted. IMPRESSION: 1.  Bilateral renal cortical irregularity suggesting scarring. 2. 13 mm nonobstructing left renal stone. No acute renal abnormality identified. No hydronephrosis. 3. Incidental note is made of increased hepatic echogenicity consistent fatty infiltration and/or hepatocellular disease. Mild ascites is incidentally noted . Electronically Signed   By: Marcello Moores  Register   On: 08/05/2017 12:58   Dg Abd 2 Views  Result Date: 08/05/2017 CLINICAL DATA:  Abdomen pain with emesis and diarrhea, status post laparotomy 08/02/2017 EXAM: ABDOMEN - 2 VIEW COMPARISON:  07/28/2017, 08/02/2017 FINDINGS: Supine and upright views of the abdomen demonstrate small to moderate free air beneath the right greater than left diaphragm. Esophageal tube tip overlies the proximal stomach. Midline cutaneous staples. Mild gaseous enlargement of small bowel up to 3.8 cm in the right mid abdomen. Paucity of distal gas. Scattered fluid levels. Calcifications overlying the left kidney. IMPRESSION: 1. Small moderate pneumoperitoneum, probably related to recent laparotomy ; radiographic follow-up may be performed to insure that this is decreasing as opposed increasing given the quantity under the right hemidiaphragm. 2. Mild gaseous enlargement of right lower quadrant small bowel with fluid levels suspicious for a mild ileus. 3. Left kidney stones Electronically Signed   By: Madie Reno.D.  On: 08/05/2017 15:08   Dg Ugi  W/kub  Result Date: 08/06/2017 CLINICAL DATA:  Recent bypass gastric  jejunostomy. 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 Evaluate for leak EXAM: WATER SOLUBLE UPPER GI SERIES TECHNIQUE: Single-column upper GI series was performed using water soluble contrast. CONTRAST:  350 mL p.O. <See Chart> ISOVUE-300 IOPAMIDOL (ISOVUE-300) INJECTION 61% COMPARISON:  CT 07/28/2017 FLUOROSCOPY TIME:  Fluoroscopy Time:  3 minutes 18 seconds Radiation Exposure Index (if provided by the fluoroscopic device): Number of Acquired Spot Images: 0 FINDINGS: Isovue contrast was injected through the NG tube to fill the stomach. Stomach is normal in size and contour. Stomach empties into the duodenum. The duodenum appears obstructed in the third portion as noted on prior CT due to tumor in the mesenteric. No filling was seen in the gastrojejunostomy. Patient was positioned right lateral, left lateral, and supine, and with delayed imaging there was no filling of the gastrojejunostomy. On the final AP image, there appears to be some edema along the greater curve of the stomach which may be the anastomosis however no significant contrast is noted in the gastric jejunostomy. This could be positional or due to edema from recent surgery. Negative for extravasation of contrast.  No leak. IMPRESSION: The stomach was filled with contrast. There is obstruction of the third portion the duodenum due to tumor. No filling of the gastric jejunostomy. This could be due to edema at the anastomosis. Negative for leak. Electronically Signed   By: Franchot Gallo M.D.   On: 08/06/2017 10:48    Anti-infectives: Anti-infectives (From admission, onward)   Start     Dose/Rate Route Frequency Ordered Stop   08/02/17 1300  ceFAZolin (ANCEF) IVPB 2g/100 mL premix     2 g 200 mL/hr over 30 Minutes Intravenous On call to O.R. 08/02/17 1253 08/02/17 1400   07/30/17 0900  ceFAZolin (ANCEF) IVPB 2g/100 mL premix     2 g 200 mL/hr over 30 Minutes Intravenous On call to O.R. 07/30/17 0858 07/30/17 1029       Assessment/Plan: Mr. Chavarin is a48 yo with a gastric outlet obstruction and mesenteric mass, pathology confirms carcinoid, POD4 s/p Ex lap, gastrojejunostomy, liver biopsies of nodules, small bowel resection of ischemic bowel from the mesenteric mass. UGI with no filing of the GJ likely from edema.  -PRN For pain -IS, OOB -Ambulate today -NG to foley bag, will continue for now  -Cr improving  -Lovenox per pharmacy for A fib -Awaiting final pathology   LOS: 9 days    Virl Cagey 08/06/2017

## 2017-08-06 NOTE — Progress Notes (Signed)
PROGRESS NOTE    [facility]  JHE:[id number] DOB: 1941/01/06 DOA: 07/28/2017 PCP: [name], MD     Brief Narrative:  76 year old man admitted from home on 11/7 due to nausea and vomiting.  History of atrial fibrillation, stroke, stage III chronic kidney disease.  CT abdomen and pelvis on admission showed an abdominal mass bowel obstruction.  As of 11/16 still no return of bowel function.  Upper GI series performed without evidence of anastomosis leak.  Surgery is following and has recommended continuation of NG tube.  Preliminary pathology with carcinoid, undetermined grade.  Assessment & Plan:   Principal Problem:   Bowel obstruction (HCC) Active Problems:   Essential hypertension   Hyperlipidemia   Coronary artery disease involving native coronary artery of native heart with angina pectoris (HCC)   CKD (chronic kidney disease) stage 3, GFR 30-59 ml/min (HCC)   PAF (paroxysmal atrial fibrillation) (HCC)   Intractable nausea and vomiting   Acute on chronic renal failure (HCC)   Gastric outlet obstruction   Neoplasm of uncertain behavior of mesentery   Preoperative cardiovascular examination   Mesenteric mass with bowel obstruction -Status post resection by Dr. Constance Haw on 11/13 with gastrojejunostomy. -Preliminary pathology with undetermined grade carcinoid tumor. -Oncology is on board, plan to set patient up for outpatient PET scan and further outpatient follow-up.  This has been scheduled for 11/26. -Still no return of bowel function. -Discussed with Dr. Constance Haw today.  Plan to continue NG tube.  Acute on chronic kidney disease stage III -Acute component likely due to ATN and prerenal azotemia. -Renal ultrasound without evidence for hydronephrosis or obstruction.  There was a 13 mm nonobstructing left renal stone. -Nephrology is on board, creatinine is starting to improve down to 3.03 today from 3.95 on 11/15. -Baseline creatinine is around  1.9.  Hyperkalemia -Resolved -Now slightly hypokalemic, likely due to NG suction will replace through IV route.  Chronic A. Fib -Rate controlled -Anticoagulation on hold due to recent surgery.  Type 2 diabetes -Fair control, continue current regimen.  Hyperlipidemia -Statin currently on hold due to n.p.o. state.  Right knee pain -Likely due to gout. -Significantly improved after 2 doses of Solu-Medrol given on 11/10 and 11/13. -Surgery would like to hold further Solu-Medrol doses so as to not impair bowel healing.   DVT prophylaxis: lovenox Code Status: full code Family Communication: Daughter at bedside  Disposition Plan: Pending medical stability and return of bowel function  Consultants:   Surgery  Nephrology  Procedures:   Ex lap, gastrojejunostomy, liver biopsy of nodules, small bowel resection of ischemic bowel from the mesenteric mass on 11/12  Antimicrobials:  Anti-infectives (From admission, onward)   Start     Dose/Rate Route Frequency Ordered Stop   08/02/17 1300  ceFAZolin (ANCEF) IVPB 2g/100 mL premix     2 g 200 mL/hr over 30 Minutes Intravenous On call to O.R. 08/02/17 1253 08/02/17 1400   07/30/17 0900  ceFAZolin (ANCEF) IVPB 2g/100 mL premix     2 g 200 mL/hr over 30 Minutes Intravenous On call to O.R. 07/30/17 0858 07/30/17 1029       Subjective: Has no true complaints today other than mild abdominal soreness.  Still without bowel movement or flatus.  Objective: Vitals:   08/05/17 2152 08/06/17 0533 08/06/17 0841 08/06/17 1336  BP: 109/61 (!) 128/56  (!) 130/57  Pulse: 92 98  99  Resp: 17 18  16   Temp: 98.1 F (36.7 C) 98 F (36.7 C)  98.2 F (  36.8 C)  TempSrc:  Oral  Oral  SpO2: 97% 93% 92% 99%  Weight:  83.9 kg (184 lb 15.5 oz)    Height:        Intake/Output Summary (Last 24 hours) at 08/06/2017 1409 Last data filed at 08/06/2017 1304 Gross per 24 hour  Intake 2674.5 ml  Output 4251 ml  Net -1576.5 ml   Filed Weights    08/04/17 0500 08/05/17 0629 08/06/17 0533  Weight: 80 kg (176 lb 5.9 oz) 83 kg (182 lb 15.7 oz) 83.9 kg (184 lb 15.5 oz)    Examination:  General exam: Alert, awake, oriented x 3, NG tube in place Respiratory system: Clear to auscultation. Respiratory effort normal. Cardiovascular system:RRR. No murmurs, rubs, gallops. Gastrointestinal system: Abdomen is nondistended, slightly tender to palpation no organomegaly or masses felt.  Hypoactive bowel sounds Central nervous system: Alert and oriented. No focal neurological deficits. Extremities: No C/C/E, +pedal pulses Skin: No rashes, lesions or ulcers Psychiatry: Judgement and insight appear normal. Mood & affect appropriate.       Data Reviewed: I have personally reviewed following labs and imaging studies  CBC: Recent Labs  Lab 08/01/17 0653 08/02/17 0625 08/03/17 0504 08/04/17 0427 08/06/17 0446  WBC 4.6 8.6 9.7 8.3 6.4  HGB 10.2* 11.6* 11.6* 9.7* 10.1*  HCT 33.6* 38.1* 38.2* 32.2* 33.7*  MCV 98.8 97.7 98.2 98.8 99.4  PLT 197 228 180 157 858*   Basic Metabolic Panel: Recent Labs  Lab 08/02/17 0625 08/03/17 0504 08/04/17 0427 08/05/17 0507 08/06/17 0446  NA 138 140 141 143 140  K 3.8 3.8 4.0 3.7 3.3*  CL 100* 101 102 101 100*  CO2 26 26 25 29 28   GLUCOSE 119* 135* 142* 125* 111*  BUN 55* 63* 75* 88* 82*  CREATININE 1.95* 2.54* 3.84* 3.95* 3.03*  CALCIUM 9.4 8.6* 8.4* 8.4* 8.1*  PHOS  --   --   --   --  3.7   GFR: Estimated Creatinine Clearance: 22.8 mL/min (A) (by C-G formula based on SCr of 3.03 mg/dL (H)). Liver Function Tests: Recent Labs  Lab 08/06/17 0446  ALBUMIN 2.6*   No results for input(s): LIPASE, AMYLASE in the last 168 hours. No results for input(s): AMMONIA in the last 168 hours. Coagulation Profile: Recent Labs  Lab 07/30/17 1554  INR 1.35   Cardiac Enzymes: No results for input(s): CKTOTAL, CKMB, CKMBINDEX, TROPONINI in the last 168 hours. BNP (last 3 results) No results for  input(s): PROBNP in the last 8760 hours. HbA1C: No results for input(s): HGBA1C in the last 72 hours. CBG: Recent Labs  Lab 07/31/17 0734 07/31/17 1101 07/31/17 1709 08/02/17 1132 08/02/17 1532  GLUCAP 87 84 102* 105* 148*   Lipid Profile: No results for input(s): CHOL, HDL, LDLCALC, TRIG, CHOLHDL, LDLDIRECT in the last 72 hours. Thyroid Function Tests: No results for input(s): TSH, T4TOTAL, FREET4, T3FREE, THYROIDAB in the last 72 hours. Anemia Panel: No results for input(s): VITAMINB12, FOLATE, FERRITIN, TIBC, IRON, RETICCTPCT in the last 72 hours. Urine analysis:    Component Value Date/Time   COLORURINE YELLOW 07/28/2017 1511   APPEARANCEUR HAZY (A) 07/28/2017 1511   LABSPEC 1.017 07/28/2017 1511   PHURINE 5.0 07/28/2017 1511   GLUCOSEU NEGATIVE 07/28/2017 1511   HGBUR MODERATE (A) 07/28/2017 1511   BILIRUBINUR NEGATIVE 07/28/2017 1511   KETONESUR NEGATIVE 07/28/2017 1511   PROTEINUR NEGATIVE 07/28/2017 1511   UROBILINOGEN 0.2 07/05/2008 0922   NITRITE NEGATIVE 07/28/2017 1511   LEUKOCYTESUR SMALL (A) 07/28/2017 1511  Sepsis Labs: @LABRCNTIP (procalcitonin:4,lacticidven:4)  )No results found for this or any previous visit (from the past 240 hour(s)).       Radiology Studies: US Renal  Result Date: 08/05/2017 CLINICAL DATA:  Acute renal disease superimposed on chronic renal disease. EXAM: RENAL / URINARY TRACT ULTRASOUND COMPLETE COMPARISON:  Abdominal series 11 08/2017. CT 07/28/2017. Ultrasound 03/02/2017 . FINDINGS: Right Kidney: Length: 10.5 cm. Echogenicity within normal limits. Cortical irregularity suggesting scarring . No mass or hydronephrosis visualized. Left Kidney: Length: 11.4 cm. Echogenicity within normal limits. Cortical irregularity suggesting scarring . No mass or hydronephrosis visualized. 13 mm nonobstructing stone. Bladder: A Foley catheter noted.  Bladder is nondistended. Incidental note is made of increased hepatic echogenicity consistent  fatty infiltration and/or hepatocellular disease. Mild ascites incidentally noted. IMPRESSION: 1.  Bilateral renal cortical irregularity suggesting scarring. 2. 13 mm nonobstructing left renal stone. No acute renal abnormality identified. No hydronephrosis. 3. Incidental note is made of increased hepatic echogenicity consistent fatty infiltration and/or hepatocellular disease. Mild ascites is incidentally noted . Electronically Signed   By: Marcello Moores  Register   On: 08/05/2017 12:58   Dg Abd 2 Views  Result Date: 08/05/2017 CLINICAL DATA:  Abdomen pain with emesis and diarrhea, status post laparotomy 08/02/2017 EXAM: ABDOMEN - 2 VIEW COMPARISON:  07/28/2017, 08/02/2017 FINDINGS: Supine and upright views of the abdomen demonstrate small to moderate free air beneath the right greater than left diaphragm. Esophageal tube tip overlies the proximal stomach. Midline cutaneous staples. Mild gaseous enlargement of small bowel up to 3.8 cm in the right mid abdomen. Paucity of distal gas. Scattered fluid levels. Calcifications overlying the left kidney. IMPRESSION: 1. Small moderate pneumoperitoneum, probably related to recent laparotomy ; radiographic follow-up may be performed to insure that this is decreasing as opposed increasing given the quantity under the right hemidiaphragm. 2. Mild gaseous enlargement of right lower quadrant small bowel with fluid levels suspicious for a mild ileus. 3. Left kidney stones Electronically Signed   By: Donavan Foil M.D.   On: 08/05/2017 15:08   Dg Ugi  W/kub  Result Date: 08/06/2017 CLINICAL DATA:  Recent bypass gastric jejunostomy. 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 Evaluate for leak EXAM: WATER SOLUBLE UPPER GI SERIES TECHNIQUE: Single-column upper GI series was performed using water soluble contrast. CONTRAST:  350 mL p.O. <See Chart> ISOVUE-300 IOPAMIDOL (ISOVUE-300) INJECTION 61% COMPARISON:  CT 07/28/2017 FLUOROSCOPY TIME:  Fluoroscopy Time:  3 minutes 18 seconds  Radiation Exposure Index (if provided by the fluoroscopic device): Number of Acquired Spot Images: 0 FINDINGS: Isovue contrast was injected through the NG tube to fill the stomach. Stomach is normal in size and contour. Stomach empties into the duodenum. The duodenum appears obstructed in the third portion as noted on prior CT due to tumor in the mesenteric. No filling was seen in the gastrojejunostomy. Patient was positioned right lateral, left lateral, and supine, and with delayed imaging there was no filling of the gastrojejunostomy. On the final AP image, there appears to be some edema along the greater curve of the stomach which may be the anastomosis however no significant contrast is noted in the gastric jejunostomy. This could be positional or due to edema from recent surgery. Negative for extravasation of contrast.  No leak. IMPRESSION: The stomach was filled with contrast. There is obstruction of the third portion the duodenum due to tumor. No filling of the gastric jejunostomy. This could be due to edema at the anastomosis. Negative for leak. Electronically Signed   By: Franchot Gallo M.D.  On: 08/06/2017 10:48        Scheduled Meds: . enoxaparin (LOVENOX) injection  1 mg/kg Subcutaneous Q24H  . furosemide  40 mg Intravenous BID  . iopamidol      . metoprolol tartrate  2.5 mg Intravenous Q6H  . sodium chloride flush  3 mL Intravenous Q12H   Continuous Infusions: . 0.9 % NaCl with KCl 20 mEq / L 125 mL/hr at 08/06/17 1057     LOS: 9 days    Time spent: 25 minutes. Greater than 50% of this time was spent in direct contact with the patient coordinating care.     Lelon Frohlich, MD [facility] Pager [phone]  If 7PM-7AM, please contact night-coverage [email or web address] Password [email or web address] 08/06/2017, 2:09 PM

## 2017-08-06 NOTE — Progress Notes (Signed)
Subjective: Interval History: has no complaint of difficulty in breathing.  Patient also denies any abdominal pain..  Objective: Vital signs in last 24 hours: Temp:  [98 F (36.7 C)-98.1 F (36.7 C)] 98 F (36.7 C) (11/16 0533) Pulse Rate:  [92-98] 98 (11/16 0533) Resp:  [17-18] 18 (11/16 0533) BP: (109-128)/(56-61) 128/56 (11/16 0533) SpO2:  [92 %-97 %] 92 % (11/16 0841) Weight:  [83.9 kg (184 lb 15.5 oz)] 83.9 kg (184 lb 15.5 oz) (11/16 0533) Weight change: 0.9 kg (1 lb 15.8 oz)  Intake/Output from previous day: 11/15 0701 - 11/16 0700 In: 3420.3 [P.O.:240; I.V.:3180.3] Out: 3600 [Urine:3600] Intake/Output this shift: No intake/output data recorded.  General appearance: alert, cooperative and no distress Resp: clear to auscultation bilaterally Cardio: regular rate and rhythm GI: abnormal findings:  Positive bowel sound Extremities: No edema  Lab Results: Recent Labs    08/04/17 0427 08/06/17 0446  WBC 8.3 6.4  HGB 9.7* 10.1*  HCT 32.2* 33.7*  PLT 157 149*   BMET:  Recent Labs    08/05/17 0507 08/06/17 0446  NA 143 140  K 3.7 3.3*  CL 101 100*  CO2 29 28  GLUCOSE 125* 111*  BUN 88* 82*  CREATININE 3.95* 3.03*  CALCIUM 8.4* 8.1*   No results for input(s): PTH in the last 72 hours. Iron Studies: No results for input(s): IRON, TIBC, TRANSFERRIN, FERRITIN in the last 72 hours.  Studies/Results: US Renal  Result Date: 08/05/2017 CLINICAL DATA:  Acute renal disease superimposed on chronic renal disease. EXAM: RENAL / URINARY TRACT ULTRASOUND COMPLETE COMPARISON:  Abdominal series 11 08/2017. CT 07/28/2017. Ultrasound 03/02/2017 . FINDINGS: Right Kidney: Length: 10.5 cm. Echogenicity within normal limits. Cortical irregularity suggesting scarring . No mass or hydronephrosis visualized. Left Kidney: Length: 11.4 cm. Echogenicity within normal limits. Cortical irregularity suggesting scarring . No mass or hydronephrosis visualized. 13 mm nonobstructing stone.  Bladder: A Foley catheter noted.  Bladder is nondistended. Incidental note is made of increased hepatic echogenicity consistent fatty infiltration and/or hepatocellular disease. Mild ascites incidentally noted. IMPRESSION: 1.  Bilateral renal cortical irregularity suggesting scarring. 2. 13 mm nonobstructing left renal stone. No acute renal abnormality identified. No hydronephrosis. 3. Incidental note is made of increased hepatic echogenicity consistent fatty infiltration and/or hepatocellular disease. Mild ascites is incidentally noted . Electronically Signed   By: Marcello Moores  Register   On: 08/05/2017 12:58   Dg Abd 2 Views  Result Date: 08/05/2017 CLINICAL DATA:  Abdomen pain with emesis and diarrhea, status post laparotomy 08/02/2017 EXAM: ABDOMEN - 2 VIEW COMPARISON:  07/28/2017, 08/02/2017 FINDINGS: Supine and upright views of the abdomen demonstrate small to moderate free air beneath the right greater than left diaphragm. Esophageal tube tip overlies the proximal stomach. Midline cutaneous staples. Mild gaseous enlargement of small bowel up to 3.8 cm in the right mid abdomen. Paucity of distal gas. Scattered fluid levels. Calcifications overlying the left kidney. IMPRESSION: 1. Small moderate pneumoperitoneum, probably related to recent laparotomy ; radiographic follow-up may be performed to insure that this is decreasing as opposed increasing given the quantity under the right hemidiaphragm. 2. Mild gaseous enlargement of right lower quadrant small bowel with fluid levels suspicious for a mild ileus. 3. Left kidney stones Electronically Signed   By: Donavan Foil M.D.   On: 08/05/2017 15:08    I have reviewed the patient's current medications.  Assessment/Plan: Problem #1 acute kidney injury superimposed on chronic.  Etiology was thought to be secondary to prerenal versus ATN.  Presently his  creatinine is 3.03 improving.  Patient is also nonoliguric with 3600 cc of urine output. Problem #2 chronic  renal failure: Stage IV.  Thought to be secondary to hypertension/diabetes/recurrent acute kidney injury/age-related renal function loss.  Ultrasound of the kidneys showed right kidney to be 10.5 left kidney 11.4 and  no hydronephrosis. 3] hypokalemia: Possibly secondary to renal loss 4] anemia: His hemoglobin is stable 5] bone and mineral disorder: His calcium and phosphorus is range 6] history of coronary artery disease 7] history of mesenteric mass status post surgery Plan: 1] DC normal saline 2] we will start patient on normal saline with 20 mEq of KCl at 125 cc/h  3[we will decrease Lasix to 40 mg IV twice daily 4] we will check renal panel in the morning.    LOS: 9 days  Kellyjo Edgren S 08/06/2017,8:59 AM

## 2017-08-07 ENCOUNTER — Inpatient Hospital Stay (HOSPITAL_COMMUNITY): Payer: PPO

## 2017-08-07 LAB — RENAL FUNCTION PANEL
ANION GAP: 12 (ref 5–15)
Albumin: 2.8 g/dL — ABNORMAL LOW (ref 3.5–5.0)
BUN: 76 mg/dL — ABNORMAL HIGH (ref 6–20)
CO2: 24 mmol/L (ref 22–32)
Calcium: 8.4 mg/dL — ABNORMAL LOW (ref 8.9–10.3)
Chloride: 107 mmol/L (ref 101–111)
Creatinine, Ser: 2.37 mg/dL — ABNORMAL HIGH (ref 0.61–1.24)
GFR calc Af Amer: 29 mL/min — ABNORMAL LOW (ref >60)
GFR, EST NON AFRICAN AMERICAN: 25 mL/min — AB (ref >60)
GLUCOSE: 100 mg/dL — AB (ref 65–99)
PHOSPHORUS: 2.9 mg/dL (ref 2.5–4.6)
Potassium: 3.8 mmol/L (ref 3.5–5.1)
SODIUM: 143 mmol/L (ref 135–145)

## 2017-08-07 MED ORDER — DEXTROSE-NACL 5-0.9 % IV SOLN
INTRAVENOUS | Status: DC
  Administered 2017-08-07 – 2017-08-08 (×3): via INTRAVENOUS

## 2017-08-07 MED ORDER — ACETAMINOPHEN 160 MG/5ML PO SOLN
650.0000 mg | Freq: Four times a day (QID) | ORAL | Status: DC
  Administered 2017-08-07 – 2017-08-08 (×4): 650 mg via ORAL
  Filled 2017-08-07 (×4): qty 20.3

## 2017-08-07 NOTE — Progress Notes (Signed)
[facility] Progress Note  5 Days Post-Op  Subjective: Patient doing well. Only complaint is the NG tube. UGI yesterday without passage of contrast and no leak. Xray this AM with some more gas in the bowel but not really in the small bowel, but again has had the NG in place.  Has had multiple BMs and taking in sips/chips for comfort.   Final pathology returned as neuroendocrine tumor, low grade. Print out given to the patient.   Objective: Vital signs in last 24 hours: Temp:  [98.2 F (36.8 C)-98.5 F (36.9 C)] 98.5 F (36.9 C) (11/17 0526) Pulse Rate:  [98-105] 105 (11/17 0526) Resp:  [16] 16 (11/17 0526) BP: (123-146)/(57-70) 123/58 (11/17 0526) SpO2:  [93 %-100 %] 95 % (11/17 0526) Weight:  [182 lb 5.1 oz (82.7 kg)] 182 lb 5.1 oz (82.7 kg) (11/17 0600) Last BM Date: 08/01/17  Intake/Output from previous day: 11/16 0701 - 11/17 0700 In: 344.7 [I.V.:344.7] Out: 2552 [Urine:1650; Emesis/NG output:900; Stool:2] Intake/Output this shift: No intake/output data recorded.  General appearance: alert, cooperative, no distress and NG with bilious output Resp: normal work breathing GI: soft, nondistended, staples c/d/i with no drainage or erythema Extremities: extremities normal, atraumatic, no cyanosis or edema  Lab Results:  Recent Labs    08/06/17 0446  WBC 6.4  HGB 10.1*  HCT 33.7*  PLT 149*   BMET Recent Labs    08/06/17 0446 08/07/17 0701  NA 140 143  K 3.3* 3.8  CL 100* 107  CO2 28 24  GLUCOSE 111* 100*  BUN 82* 76*  CREATININE 3.03* 2.37*  CALCIUM 8.1* 8.4*    Studies/Results: Dg Abd 1 View  Result Date: 08/07/2017 CLINICAL DATA:  History of bypass gastrojejunostomy. Evaluate for a air or contrast moving into small bowel EXAM: ABDOMEN - 1 VIEW COMPARISON:  Upper GI 08/06/2017 FINDINGS: Unable to assess for patency of the gastrojejunostomy. NG tube tip is present in the mid stomach. Nonobstructive bowel gas pattern noted. No free air  organomegaly. Left lower pole nephrolithiasis noted. IMPRESSION: Nonobstructive bowel gas pattern. NG tube tip remains in the mid stomach. Cannot determine patency of gastrojejunostomy on this plain film study. Electronically Signed   By: Rolm Baptise M.D.   On: 08/07/2017 07:56   US Renal  Result Date: 08/05/2017 CLINICAL DATA:  Acute renal disease superimposed on chronic renal disease. EXAM: RENAL / URINARY TRACT ULTRASOUND COMPLETE COMPARISON:  Abdominal series 11 08/2017. CT 07/28/2017. Ultrasound 03/02/2017 . FINDINGS: Right Kidney: Length: 10.5 cm. Echogenicity within normal limits. Cortical irregularity suggesting scarring . No mass or hydronephrosis visualized. Left Kidney: Length: 11.4 cm. Echogenicity within normal limits. Cortical irregularity suggesting scarring . No mass or hydronephrosis visualized. 13 mm nonobstructing stone. Bladder: A Foley catheter noted.  Bladder is nondistended. Incidental note is made of increased hepatic echogenicity consistent fatty infiltration and/or hepatocellular disease. Mild ascites incidentally noted. IMPRESSION: 1.  Bilateral renal cortical irregularity suggesting scarring. 2. 13 mm nonobstructing left renal stone. No acute renal abnormality identified. No hydronephrosis. 3. Incidental note is made of increased hepatic echogenicity consistent fatty infiltration and/or hepatocellular disease. Mild ascites is incidentally noted . Electronically Signed   By: Marcello Moores  Register   On: 08/05/2017 12:58   Dg Abd 2 Views  Result Date: 08/05/2017 CLINICAL DATA:  Abdomen pain with emesis and diarrhea, status post laparotomy 08/02/2017 EXAM: ABDOMEN - 2 VIEW COMPARISON:  07/28/2017, 08/02/2017 FINDINGS: Supine and upright views of the abdomen demonstrate small to moderate free air beneath the  right greater than left diaphragm. Esophageal tube tip overlies the proximal stomach. Midline cutaneous staples. Mild gaseous enlargement of small bowel up to 3.8 cm in the right mid  abdomen. Paucity of distal gas. Scattered fluid levels. Calcifications overlying the left kidney. IMPRESSION: 1. Small moderate pneumoperitoneum, probably related to recent laparotomy ; radiographic follow-up may be performed to insure that this is decreasing as opposed increasing given the quantity under the right hemidiaphragm. 2. Mild gaseous enlargement of right lower quadrant small bowel with fluid levels suspicious for a mild ileus. 3. Left kidney stones Electronically Signed   By: Donavan Foil M.D.   On: 08/05/2017 15:08   Dg Ugi  W/kub  Result Date: 08/06/2017 CLINICAL DATA:  Recent bypass gastric jejunostomy. 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 Evaluate for leak EXAM: WATER SOLUBLE UPPER GI SERIES TECHNIQUE: Single-column upper GI series was performed using water soluble contrast. CONTRAST:  350 mL p.O. <See Chart> ISOVUE-300 IOPAMIDOL (ISOVUE-300) INJECTION 61% COMPARISON:  CT 07/28/2017 FLUOROSCOPY TIME:  Fluoroscopy Time:  3 minutes 18 seconds Radiation Exposure Index (if provided by the fluoroscopic device): Number of Acquired Spot Images: 0 FINDINGS: Isovue contrast was injected through the NG tube to fill the stomach. Stomach is normal in size and contour. Stomach empties into the duodenum. The duodenum appears obstructed in the third portion as noted on prior CT due to tumor in the mesenteric. No filling was seen in the gastrojejunostomy. Patient was positioned right lateral, left lateral, and supine, and with delayed imaging there was no filling of the gastrojejunostomy. On the final AP image, there appears to be some edema along the greater curve of the stomach which may be the anastomosis however no significant contrast is noted in the gastric jejunostomy. This could be positional or due to edema from recent surgery. Negative for extravasation of contrast.  No leak. IMPRESSION: The stomach was filled with contrast. There is obstruction of the third portion the duodenum due to tumor.  No filling of the gastric jejunostomy. This could be due to edema at the anastomosis. Negative for leak. Electronically Signed   By: Franchot Gallo M.D.   On: 08/06/2017 10:48    Assessment/Plan: Mr. Bok is a56 yo with a gastric outlet obstruction and mesenteric mass, pathology confirms carcinoid, POD5 s/p Ex lap, gastrojejunostomy, liver biopsies of nodules, small bowel resection of ischemic bowel from the mesenteric mass.Patholgoy with low grade neuroendocrine tumor with metastasis to the liver.  -PRN For pain -IS, OOB, ambulate  -D/c NG, can have 30 cc of sips/ice per hour, RN not to reinsert NG tube and discussed with Audrea Muscat  -Cr improving and UOP good, nephrology is stopping lasix   -Lovenox per pharmacy for A fib   LOS: 10 days    Virl Cagey 08/07/2017

## 2017-08-07 NOTE — Progress Notes (Signed)
Subjective: Interval History: Patient offers no complaints.  He denies any difficulty in breathing.  Objective: Vital signs in last 24 hours: Temp:  [98.2 F (36.8 C)-98.5 F (36.9 C)] 98.5 F (36.9 C) (11/17 0526) Pulse Rate:  [98-105] 105 (11/17 0526) Resp:  [16] 16 (11/17 0526) BP: (123-146)/(57-70) 123/58 (11/17 0526) SpO2:  [93 %-100 %] 95 % (11/17 0526) Weight:  [82.7 kg (182 lb 5.1 oz)] 82.7 kg (182 lb 5.1 oz) (11/17 0600) Weight change: -1.2 kg (-10.3 oz)  Intake/Output from previous day: 11/16 0701 - 11/17 0700 In: 344.7 [I.V.:344.7] Out: 2552 [Urine:1650; Emesis/NG output:900; Stool:2] Intake/Output this shift: No intake/output data recorded.  General appearance: alert, cooperative and no distress Resp: clear to auscultation bilaterally Cardio: regular rate and rhythm GI: abnormal findings:  Positive bowel sound Extremities: No edema  Lab Results: Recent Labs    08/06/17 0446  WBC 6.4  HGB 10.1*  HCT 33.7*  PLT 149*   BMET:  Recent Labs    08/06/17 0446 08/07/17 0701  NA 140 143  K 3.3* 3.8  CL 100* 107  CO2 28 24  GLUCOSE 111* 100*  BUN 82* 76*  CREATININE 3.03* 2.37*  CALCIUM 8.1* 8.4*   No results for input(s): PTH in the last 72 hours. Iron Studies: No results for input(s): IRON, TIBC, TRANSFERRIN, FERRITIN in the last 72 hours.  Studies/Results: Dg Abd 1 View  Result Date: 08/07/2017 CLINICAL DATA:  History of bypass gastrojejunostomy. Evaluate for a air or contrast moving into small bowel EXAM: ABDOMEN - 1 VIEW COMPARISON:  Upper GI 08/06/2017 FINDINGS: Unable to assess for patency of the gastrojejunostomy. NG tube tip is present in the mid stomach. Nonobstructive bowel gas pattern noted. No free air organomegaly. Left lower pole nephrolithiasis noted. IMPRESSION: Nonobstructive bowel gas pattern. NG tube tip remains in the mid stomach. Cannot determine patency of gastrojejunostomy on this plain film study. Electronically Signed   By: Rolm Baptise M.D.   On: 08/07/2017 07:56   US Renal  Result Date: 08/05/2017 CLINICAL DATA:  Acute renal disease superimposed on chronic renal disease. EXAM: RENAL / URINARY TRACT ULTRASOUND COMPLETE COMPARISON:  Abdominal series 11 08/2017. CT 07/28/2017. Ultrasound 03/02/2017 . FINDINGS: Right Kidney: Length: 10.5 cm. Echogenicity within normal limits. Cortical irregularity suggesting scarring . No mass or hydronephrosis visualized. Left Kidney: Length: 11.4 cm. Echogenicity within normal limits. Cortical irregularity suggesting scarring . No mass or hydronephrosis visualized. 13 mm nonobstructing stone. Bladder: A Foley catheter noted.  Bladder is nondistended. Incidental note is made of increased hepatic echogenicity consistent fatty infiltration and/or hepatocellular disease. Mild ascites incidentally noted. IMPRESSION: 1.  Bilateral renal cortical irregularity suggesting scarring. 2. 13 mm nonobstructing left renal stone. No acute renal abnormality identified. No hydronephrosis. 3. Incidental note is made of increased hepatic echogenicity consistent fatty infiltration and/or hepatocellular disease. Mild ascites is incidentally noted . Electronically Signed   By: Marcello Moores  Register   On: 08/05/2017 12:58   Dg Abd 2 Views  Result Date: 08/05/2017 CLINICAL DATA:  Abdomen pain with emesis and diarrhea, status post laparotomy 08/02/2017 EXAM: ABDOMEN - 2 VIEW COMPARISON:  07/28/2017, 08/02/2017 FINDINGS: Supine and upright views of the abdomen demonstrate small to moderate free air beneath the right greater than left diaphragm. Esophageal tube tip overlies the proximal stomach. Midline cutaneous staples. Mild gaseous enlargement of small bowel up to 3.8 cm in the right mid abdomen. Paucity of distal gas. Scattered fluid levels. Calcifications overlying the left kidney. IMPRESSION: 1. Small moderate pneumoperitoneum,  probably related to recent laparotomy ; radiographic follow-up may be performed to insure that this  is decreasing as opposed increasing given the quantity under the right hemidiaphragm. 2. Mild gaseous enlargement of right lower quadrant small bowel with fluid levels suspicious for a mild ileus. 3. Left kidney stones Electronically Signed   By: Donavan Foil M.D.   On: 08/05/2017 15:08   Dg Ugi  W/kub  Result Date: 08/06/2017 CLINICAL DATA:  Recent bypass gastric jejunostomy. 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 Evaluate for leak EXAM: WATER SOLUBLE UPPER GI SERIES TECHNIQUE: Single-column upper GI series was performed using water soluble contrast. CONTRAST:  350 mL p.O. <See Chart> ISOVUE-300 IOPAMIDOL (ISOVUE-300) INJECTION 61% COMPARISON:  CT 07/28/2017 FLUOROSCOPY TIME:  Fluoroscopy Time:  3 minutes 18 seconds Radiation Exposure Index (if provided by the fluoroscopic device): Number of Acquired Spot Images: 0 FINDINGS: Isovue contrast was injected through the NG tube to fill the stomach. Stomach is normal in size and contour. Stomach empties into the duodenum. The duodenum appears obstructed in the third portion as noted on prior CT due to tumor in the mesenteric. No filling was seen in the gastrojejunostomy. Patient was positioned right lateral, left lateral, and supine, and with delayed imaging there was no filling of the gastrojejunostomy. On the final AP image, there appears to be some edema along the greater curve of the stomach which may be the anastomosis however no significant contrast is noted in the gastric jejunostomy. This could be positional or due to edema from recent surgery. Negative for extravasation of contrast.  No leak. IMPRESSION: The stomach was filled with contrast. There is obstruction of the third portion the duodenum due to tumor. No filling of the gastric jejunostomy. This could be due to edema at the anastomosis. Negative for leak. Electronically Signed   By: Franchot Gallo M.D.   On: 08/06/2017 10:48    I have reviewed the patient's current  medications.  Assessment/Plan: Problem #1 acute kidney injury superimposed on chronic.  Etiology was thought to be secondary to prerenal versus ATN.  His renal function continued to improve.  His creatinine is 2.51.  It has returned to his baseline.  Patient had 2500 cc of output.  Abir.March  urine.] Problem #2 chronic renal failure: Stage IV.  Thought to be secondary to hypertension/diabetes/recurrent acute kidney injury/age-related renal function loss.  Ultrasound of the kidneys showed right kidney to be 10.5 left kidney 11.4 and  no hydronephrosis. 3] hypokalemia: Patient is on potassium supplement and his potassium is normal. 4] anemia: His hemoglobin is stable 5] bone and mineral disorder: His calcium and phosphorus is range 6] history of coronary artery disease: No chest pain. 7] history of mesenteric mass status post surgery patient is still has an NG tube. Plan: 1] will DC Lasix 2[we will change IV fluid to D5 normal at 100 cc/h 3] we will check renal panel in the morning.    LOS: 10 days  Delisha Peaden S 08/07/2017,9:19 AM

## 2017-08-07 NOTE — Progress Notes (Signed)
PROGRESS NOTE   Stephen [name] [facility]  KGU:[id number] DOB: 1941/02/19 DOA: 07/28/2017 PCP: [name], MD   Brief Narrative:  76 year old man admitted from home on 11/7 due to nausea and vomiting.  History of atrial fibrillation, stroke, stage III chronic kidney disease.  CT abdomen and pelvis on admission showed an abdominal mass bowel obstruction.  As of 11/16 still no return of bowel function.  Upper GI series performed without evidence of anastomosis leak.  Surgery is following and has recommended continuation of NG tube.  Preliminary pathology with carcinoid, undetermined grade.  Assessment & Plan:   Principal Problem:   Bowel obstruction (HCC) Active Problems:   Essential hypertension   Hyperlipidemia   Coronary artery disease involving native coronary artery of native heart with angina pectoris (HCC)   CKD (chronic kidney disease) stage 3, GFR 30-59 ml/min (HCC)   PAF (paroxysmal atrial fibrillation) (HCC)   Intractable nausea and vomiting   Acute on chronic renal failure (HCC)   Gastric outlet obstruction   Neoplasm of uncertain behavior of mesentery   Preoperative cardiovascular examination   Mesenteric mass with bowel obstruction -Status post resection by Dr. Constance Haw on 11/13 with gastrojejunostomy. -Preliminary pathology with undetermined grade carcinoid tumor. -Oncology is on board, plan to set patient up for outpatient PET scan and further outpatient follow-up.  This has been scheduled for 11/26. -bowel function returning, pt had multiple loose bowel movements yesterday  Acute on chronic kidney disease stage III -Acute component likely due to ATN and prerenal azotemia. -Renal ultrasound without evidence for hydronephrosis or obstruction.  There was a 13 mm nonobstructing left renal stone. -Nephrology is on board, creatinine is starting to improve down to 2.37 from 3.95 on 11/15. -Baseline creatinine is around 1.9.  Hyperkalemia -Resolved  Chronic A. Fib -Rate  controlled -Anticoagulation on hold due to recent surgery.  Type 2 diabetes -Fair control, continue current regimen.  Hyperlipidemia -Statin currently on hold due to n.p.o. state.  Right knee pain -Likely due to gout. -Significantly improved after 2 doses of Solu-Medrol given on 11/10 and 11/13. -Surgery would like to hold further Solu-Medrol doses so as to not impair bowel healing. -Rx liquid acetaminophen scheduled for treating inflammation.    DVT prophylaxis: lovenox Code Status: full code Family Communication: Daughter at bedside  Disposition Plan: TBD  Consultants:   Surgery  Nephrology  Procedures:   Ex lap, gastrojejunostomy, liver biopsy of nodules, small bowel resection of ischemic bowel from the mesenteric mass on 11/12  Antimicrobials:  Anti-infectives (From admission, onward)   Start     Dose/Rate Route Frequency Ordered Stop   08/02/17 1300  ceFAZolin (ANCEF) IVPB 2g/100 mL premix     2 g 200 mL/hr over 30 Minutes Intravenous On call to O.R. 08/02/17 1253 08/02/17 1400   07/30/17 0900  ceFAZolin (ANCEF) IVPB 2g/100 mL premix     2 g 200 mL/hr over 30 Minutes Intravenous On call to O.R. 07/30/17 0858 07/30/17 1029     Subjective: Pt c/o right knee gout pain not severe but aggravating.   Objective: Vitals:   08/06/17 2008 08/06/17 2129 08/07/17 0526 08/07/17 0600  BP:  (!) 146/70 (!) 123/58   Pulse:  98 (!) 105   Resp:  16 16   Temp:  98.3 F (36.8 C) 98.5 F (36.9 C)   TempSrc:  Oral    SpO2: 93% 100% 95%   Weight:    82.7 kg (182 lb 5.1 oz)  Height:  Intake/Output Summary (Last 24 hours) at 08/07/2017 0855 Last data filed at 08/07/2017 0500 Gross per 24 hour  Intake 344.67 ml  Output 2552 ml  Net -2207.33 ml   Filed Weights   08/05/17 0629 08/06/17 0533 08/07/17 0600  Weight: 83 kg (182 lb 15.7 oz) 83.9 kg (184 lb 15.5 oz) 82.7 kg (182 lb 5.1 oz)    Examination:  General exam: Alert, awake, oriented x 3, NG tube in  place Respiratory system: Clear to auscultation. Respiratory effort normal. Cardiovascular system:RRR. No murmurs, rubs, gallops. Gastrointestinal system: Abdomen is nondistended, slightly tender to palpation no organomegaly or masses felt.  Hypoactive bowel sounds Central nervous system: Alert and oriented. No focal neurological deficits. Extremities: No C/C/E, +pedal pulses Skin: No rashes, lesions or ulcers Psychiatry: Judgement and insight appear normal. Mood & affect appropriate.   Data Reviewed: I have personally reviewed following labs and imaging studies  CBC: Recent Labs  Lab 08/01/17 0653 08/02/17 0625 08/03/17 0504 08/04/17 0427 08/06/17 0446  WBC 4.6 8.6 9.7 8.3 6.4  HGB 10.2* 11.6* 11.6* 9.7* 10.1*  HCT 33.6* 38.1* 38.2* 32.2* 33.7*  MCV 98.8 97.7 98.2 98.8 99.4  PLT 197 228 180 157 761*   Basic Metabolic Panel: Recent Labs  Lab 08/03/17 0504 08/04/17 0427 08/05/17 0507 08/06/17 0446 08/07/17 0701  NA 140 141 143 140 143  K 3.8 4.0 3.7 3.3* 3.8  CL 101 102 101 100* 107  CO2 26 25 29 28 24   GLUCOSE 135* 142* 125* 111* 100*  BUN 63* 75* 88* 82* 76*  CREATININE 2.54* 3.84* 3.95* 3.03* 2.37*  CALCIUM 8.6* 8.4* 8.4* 8.1* 8.4*  PHOS  --   --   --  3.7 2.9   GFR: Estimated Creatinine Clearance: 29.1 mL/min (A) (by C-G formula based on SCr of 2.37 mg/dL (H)). Liver Function Tests: Recent Labs  Lab 08/06/17 0446 08/07/17 0701  ALBUMIN 2.6* 2.8*   No results for input(s): LIPASE, AMYLASE in the last 168 hours. No results for input(s): AMMONIA in the last 168 hours. Coagulation Profile: No results for input(s): INR, PROTIME in the last 168 hours. Cardiac Enzymes: No results for input(s): CKTOTAL, CKMB, CKMBINDEX, TROPONINI in the last 168 hours. BNP (last 3 results) No results for input(s): PROBNP in the last 8760 hours. HbA1C: No results for input(s): HGBA1C in the last 72 hours. CBG: Recent Labs  Lab 07/31/17 1101 07/31/17 1709 08/02/17 1132  08/02/17 1532  GLUCAP 84 102* 105* 148*   Lipid Profile: No results for input(s): CHOL, HDL, LDLCALC, TRIG, CHOLHDL, LDLDIRECT in the last 72 hours. Thyroid Function Tests: No results for input(s): TSH, T4TOTAL, FREET4, T3FREE, THYROIDAB in the last 72 hours. Anemia Panel: No results for input(s): VITAMINB12, FOLATE, FERRITIN, TIBC, IRON, RETICCTPCT in the last 72 hours. Urine analysis:    Component Value Date/Time   COLORURINE YELLOW 07/28/2017 1511   APPEARANCEUR HAZY (A) 07/28/2017 1511   LABSPEC 1.017 07/28/2017 1511   PHURINE 5.0 07/28/2017 1511   GLUCOSEU NEGATIVE 07/28/2017 1511   HGBUR MODERATE (A) 07/28/2017 1511   BILIRUBINUR NEGATIVE 07/28/2017 1511   KETONESUR NEGATIVE 07/28/2017 1511   PROTEINUR NEGATIVE 07/28/2017 1511   UROBILINOGEN 0.2 07/05/2008 0922   NITRITE NEGATIVE 07/28/2017 1511   LEUKOCYTESUR SMALL (A) 07/28/2017 1511   No results found for this or any previous visit (from the past 240 hour(s)).   Radiology Studies: Dg Abd 1 View  Result Date: 08/07/2017 CLINICAL DATA:  History of bypass gastrojejunostomy. Evaluate for a air or  contrast moving into small bowel EXAM: ABDOMEN - 1 VIEW COMPARISON:  Upper GI 08/06/2017 FINDINGS: Unable to assess for patency of the gastrojejunostomy. NG tube tip is present in the mid stomach. Nonobstructive bowel gas pattern noted. No free air organomegaly. Left lower pole nephrolithiasis noted. IMPRESSION: Nonobstructive bowel gas pattern. NG tube tip remains in the mid stomach. Cannot determine patency of gastrojejunostomy on this plain film study. Electronically Signed   By: Rolm Baptise M.D.   On: 08/07/2017 07:56   US Renal  Result Date: 08/05/2017 CLINICAL DATA:  Acute renal disease superimposed on chronic renal disease. EXAM: RENAL / URINARY TRACT ULTRASOUND COMPLETE COMPARISON:  Abdominal series 11 08/2017. CT 07/28/2017. Ultrasound 03/02/2017 . FINDINGS: Right Kidney: Length: 10.5 cm. Echogenicity within normal limits.  Cortical irregularity suggesting scarring . No mass or hydronephrosis visualized. Left Kidney: Length: 11.4 cm. Echogenicity within normal limits. Cortical irregularity suggesting scarring . No mass or hydronephrosis visualized. 13 mm nonobstructing stone. Bladder: A Foley catheter noted.  Bladder is nondistended. Incidental note is made of increased hepatic echogenicity consistent fatty infiltration and/or hepatocellular disease. Mild ascites incidentally noted. IMPRESSION: 1.  Bilateral renal cortical irregularity suggesting scarring. 2. 13 mm nonobstructing left renal stone. No acute renal abnormality identified. No hydronephrosis. 3. Incidental note is made of increased hepatic echogenicity consistent fatty infiltration and/or hepatocellular disease. Mild ascites is incidentally noted . Electronically Signed   By: Marcello Moores  Register   On: 08/05/2017 12:58   Dg Abd 2 Views  Result Date: 08/05/2017 CLINICAL DATA:  Abdomen pain with emesis and diarrhea, status post laparotomy 08/02/2017 EXAM: ABDOMEN - 2 VIEW COMPARISON:  07/28/2017, 08/02/2017 FINDINGS: Supine and upright views of the abdomen demonstrate small to moderate free air beneath the right greater than left diaphragm. Esophageal tube tip overlies the proximal stomach. Midline cutaneous staples. Mild gaseous enlargement of small bowel up to 3.8 cm in the right mid abdomen. Paucity of distal gas. Scattered fluid levels. Calcifications overlying the left kidney. IMPRESSION: 1. Small moderate pneumoperitoneum, probably related to recent laparotomy ; radiographic follow-up may be performed to insure that this is decreasing as opposed increasing given the quantity under the right hemidiaphragm. 2. Mild gaseous enlargement of right lower quadrant small bowel with fluid levels suspicious for a mild ileus. 3. Left kidney stones Electronically Signed   By: Donavan Foil M.D.   On: 08/05/2017 15:08   Dg Ugi  W/kub  Result Date: 08/06/2017 CLINICAL DATA:   Recent bypass gastric jejunostomy. 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 Evaluate for leak EXAM: WATER SOLUBLE UPPER GI SERIES TECHNIQUE: Single-column upper GI series was performed using water soluble contrast. CONTRAST:  350 mL p.O. <See Chart> ISOVUE-300 IOPAMIDOL (ISOVUE-300) INJECTION 61% COMPARISON:  CT 07/28/2017 FLUOROSCOPY TIME:  Fluoroscopy Time:  3 minutes 18 seconds Radiation Exposure Index (if provided by the fluoroscopic device): Number of Acquired Spot Images: 0 FINDINGS: Isovue contrast was injected through the NG tube to fill the stomach. Stomach is normal in size and contour. Stomach empties into the duodenum. The duodenum appears obstructed in the third portion as noted on prior CT due to tumor in the mesenteric. No filling was seen in the gastrojejunostomy. Patient was positioned right lateral, left lateral, and supine, and with delayed imaging there was no filling of the gastrojejunostomy. On the final AP image, there appears to be some edema along the greater curve of the stomach which may be the anastomosis however no significant contrast is noted in the gastric jejunostomy. This could be positional or  due to edema from recent surgery. Negative for extravasation of contrast.  No leak. IMPRESSION: The stomach was filled with contrast. There is obstruction of the third portion the duodenum due to tumor. No filling of the gastric jejunostomy. This could be due to edema at the anastomosis. Negative for leak. Electronically Signed   By: Franchot Gallo M.D.   On: 08/06/2017 10:48   Scheduled Meds: . acetaminophen  650 mg Oral Q6H  . enoxaparin (LOVENOX) injection  1 mg/kg Subcutaneous Q24H  . furosemide  40 mg Intravenous BID  . metoprolol tartrate  2.5 mg Intravenous Q6H  . sodium chloride flush  3 mL Intravenous Q12H   Continuous Infusions: . 0.9 % NaCl with KCl 20 mEq / L 125 mL/hr at 08/07/17 0421     LOS: 10 days   Time spent: 25 minutes. Greater than 50% of this time was  spent in direct contact with the patient coordinating care.  Irwin Brakeman, MD [facility] Pager [phone]  If 7PM-7AM, please contact night-coverage [email or web address] Password [email or web address] 08/07/2017, 8:55 AM

## 2017-08-08 LAB — CBC
HEMATOCRIT: 30.4 % — AB (ref 39.0–52.0)
HEMOGLOBIN: 9 g/dL — AB (ref 13.0–17.0)
MCH: 29.7 pg (ref 26.0–34.0)
MCHC: 29.6 g/dL — AB (ref 30.0–36.0)
MCV: 100.3 fL — ABNORMAL HIGH (ref 78.0–100.0)
Platelets: 150 10*3/uL (ref 150–400)
RBC: 3.03 MIL/uL — ABNORMAL LOW (ref 4.22–5.81)
RDW: 17.3 % — AB (ref 11.5–15.5)
WBC: 5.3 10*3/uL (ref 4.0–10.5)

## 2017-08-08 LAB — RENAL FUNCTION PANEL
ALBUMIN: 2.6 g/dL — AB (ref 3.5–5.0)
ANION GAP: 5 (ref 5–15)
BUN: 63 mg/dL — AB (ref 6–20)
CALCIUM: 8.2 mg/dL — AB (ref 8.9–10.3)
CO2: 29 mmol/L (ref 22–32)
Chloride: 111 mmol/L (ref 101–111)
Creatinine, Ser: 2.12 mg/dL — ABNORMAL HIGH (ref 0.61–1.24)
GFR calc Af Amer: 33 mL/min — ABNORMAL LOW (ref >60)
GFR, EST NON AFRICAN AMERICAN: 29 mL/min — AB (ref >60)
GLUCOSE: 140 mg/dL — AB (ref 65–99)
PHOSPHORUS: 2.2 mg/dL — AB (ref 2.5–4.6)
POTASSIUM: 3.1 mmol/L — AB (ref 3.5–5.1)
SODIUM: 145 mmol/L (ref 135–145)

## 2017-08-08 LAB — MAGNESIUM: MAGNESIUM: 1.5 mg/dL — AB (ref 1.7–2.4)

## 2017-08-08 MED ORDER — ENOXAPARIN SODIUM 120 MG/0.8ML ~~LOC~~ SOLN
120.0000 mg | SUBCUTANEOUS | Status: DC
  Administered 2017-08-09 – 2017-08-10 (×2): 120 mg via SUBCUTANEOUS
  Filled 2017-08-08 (×3): qty 0.8

## 2017-08-08 MED ORDER — ACETAMINOPHEN 160 MG/5ML PO SOLN
650.0000 mg | Freq: Four times a day (QID) | ORAL | Status: DC | PRN
  Administered 2017-08-10: 650 mg via ORAL
  Filled 2017-08-08 (×2): qty 20.3

## 2017-08-08 MED ORDER — POTASSIUM CHLORIDE IN NACL 20-0.9 MEQ/L-% IV SOLN
INTRAVENOUS | Status: DC
  Administered 2017-08-08 – 2017-08-09 (×2): via INTRAVENOUS

## 2017-08-08 MED ORDER — POTASSIUM CHLORIDE 10 MEQ/100ML IV SOLN
10.0000 meq | INTRAVENOUS | Status: AC
  Administered 2017-08-08 (×4): 10 meq via INTRAVENOUS
  Filled 2017-08-08 (×3): qty 100

## 2017-08-08 MED ORDER — MAGNESIUM SULFATE 2 GM/50ML IV SOLN
2.0000 g | Freq: Once | INTRAVENOUS | Status: AC
  Administered 2017-08-08: 2 g via INTRAVENOUS
  Filled 2017-08-08: qty 50

## 2017-08-08 MED ORDER — POTASSIUM CHLORIDE IN NACL 20-0.9 MEQ/L-% IV SOLN: INTRAVENOUS | Status: DC

## 2017-08-08 NOTE — Progress Notes (Signed)
Subjective: Interval History:  patient is feeling much better.  He denies any nausea or vomiting.  Also denies any difficulty breathing. Objective: Vital signs in last 24 hours: Temp:  [97.8 F (36.6 C)-98.8 F (37.1 C)] 98.8 F (37.1 C) (11/18 0512) Pulse Rate:  [64-100] 64 (11/18 0512) Resp:  [17-18] 18 (11/18 0512) BP: (108-137)/(52-75) 108/75 (11/18 0512) SpO2:  [94 %-96 %] 95 % (11/18 0512) Weight:  [83.3 kg (183 lb 10.3 oz)] 83.3 kg (183 lb 10.3 oz) (11/18 0512) Weight change: 0.6 kg (1 lb 5.2 oz)  Intake/Output from previous day: 11/17 0701 - 11/18 0700 In: 558.3 [I.V.:558.3] Out: 2450 [Urine:2450] Intake/Output this shift: No intake/output data recorded.  General appearance: alert, cooperative and no distress Resp: clear to auscultation bilaterally Cardio: regular rate and rhythm GI: abnormal findings:  Positive bowel sound Extremities: No edema  Lab Results: Recent Labs    08/06/17 0446 08/08/17 0602  WBC 6.4 5.3  HGB 10.1* 9.0*  HCT 33.7* 30.4*  PLT 149* 150   BMET:  Recent Labs    08/07/17 0701 08/08/17 0602  NA 143 145  K 3.8 3.1*  CL 107 111  CO2 24 29  GLUCOSE 100* 140*  BUN 76* 63*  CREATININE 2.37* 2.12*  CALCIUM 8.4* 8.2*   No results for input(s): PTH in the last 72 hours. Iron Studies: No results for input(s): IRON, TIBC, TRANSFERRIN, FERRITIN in the last 72 hours.  Studies/Results: Dg Abd 1 View  Result Date: 08/07/2017 CLINICAL DATA:  History of bypass gastrojejunostomy. Evaluate for a air or contrast moving into small bowel EXAM: ABDOMEN - 1 VIEW COMPARISON:  Upper GI 08/06/2017 FINDINGS: Unable to assess for patency of the gastrojejunostomy. NG tube tip is present in the mid stomach. Nonobstructive bowel gas pattern noted. No free air organomegaly. Left lower pole nephrolithiasis noted. IMPRESSION: Nonobstructive bowel gas pattern. NG tube tip remains in the mid stomach. Cannot determine patency of gastrojejunostomy on this plain film  study. Electronically Signed   By: Rolm Baptise M.D.   On: 08/07/2017 07:56   Dg Ugi  W/kub  Result Date: 08/06/2017 CLINICAL DATA:  Recent bypass gastric jejunostomy. 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 Evaluate for leak EXAM: WATER SOLUBLE UPPER GI SERIES TECHNIQUE: Single-column upper GI series was performed using water soluble contrast. CONTRAST:  350 mL p.O. <See Chart> ISOVUE-300 IOPAMIDOL (ISOVUE-300) INJECTION 61% COMPARISON:  CT 07/28/2017 FLUOROSCOPY TIME:  Fluoroscopy Time:  3 minutes 18 seconds Radiation Exposure Index (if provided by the fluoroscopic device): Number of Acquired Spot Images: 0 FINDINGS: Isovue contrast was injected through the NG tube to fill the stomach. Stomach is normal in size and contour. Stomach empties into the duodenum. The duodenum appears obstructed in the third portion as noted on prior CT due to tumor in the mesenteric. No filling was seen in the gastrojejunostomy. Patient was positioned right lateral, left lateral, and supine, and with delayed imaging there was no filling of the gastrojejunostomy. On the final AP image, there appears to be some edema along the greater curve of the stomach which may be the anastomosis however no significant contrast is noted in the gastric jejunostomy. This could be positional or due to edema from recent surgery. Negative for extravasation of contrast.  No leak. IMPRESSION: The stomach was filled with contrast. There is obstruction of the third portion the duodenum due to tumor. No filling of the gastric jejunostomy. This could be due to edema at the anastomosis. Negative for leak. Electronically Signed  By: Franchot Gallo M.D.   On: 08/06/2017 10:48    I have reviewed the patient's current medications.  Assessment/Plan: Problem #1 acute kidney injury superimposed on chronic.  Etiology was thought to be secondary to prerenal versus ATN.  His renal function continued to improve.  His creatinine is 2.12.  It has returned to  his baseline.  Patient had 2500 cc of output.  Problem #2 chronic renal failure: Stage IV.  Thought to be secondary to hypertension/diabetes/recurrent acute kidney injury/age-related renal function loss.  Ultrasound of the kidneys showed right kidney to be 10.5 left kidney 11.4 and  no hydronephrosis. 3] hypokalemia: Patient is on potassium has declined further.  His potassium is 3.1. 4] anemia: His hemoglobin is stable 5] bone and mineral disorder: His calcium and phosphorus is range 6] history of coronary artery disease: No chest pain. 7] history of mesenteric mass status post surgery patient is still has an NG tube. Plan: 1] will DC D5 normal 2[we will start patient on of normal saline with 20 mEq of KCl at 75 cc/h after finishing his 10 meq kcl 3] will DC Foley catheter 4] we will check renal panel in the morning. 5]D/C lasix    LOS: 11 days  Natesha Hassey S 08/08/2017,10:16 AM

## 2017-08-08 NOTE — Progress Notes (Signed)
ANTICOAGULATION CONSULT NOTE - Follow Up Consult  Pharmacy Consult for Lovenox Indication: atrial fibrillation  No Known Allergies  Patient Measurements: Height: 6' (182.9 cm) Weight: 183 lb 10.3 oz (83.3 kg) IBW/kg (Calculated) : 77.6  Vital Signs: Temp: 98.8 F (37.1 C) (11/18 0512) Temp Source: Oral (11/18 0512) BP: 124/67 (11/18 1155) Pulse Rate: 77 (11/18 1155)  Labs: Recent Labs    08/06/17 0446 08/07/17 0701 08/08/17 0602  HGB 10.1*  --  9.0*  HCT 33.7*  --  30.4*  PLT 149*  --  150  CREATININE 3.03* 2.37* 2.12*    Estimated Creatinine Clearance: 32.5 mL/min (A) (by C-G formula based on SCr of 2.12 mg/dL (H)).   Medications:  Medications Prior to Admission  Medication Sig Dispense Refill Last Dose  . acetaminophen (TYLENOL) 500 MG tablet Take 500 mg by mouth every 6 (six) hours as needed.   07/28/2017 at Unknown time  . allopurinol (ZYLOPRIM) 100 MG tablet Take 200 mg by mouth daily.    07/28/2017 at Unknown time  . apixaban (ELIQUIS) 5 MG TABS tablet Take 1 tablet (5 mg total) by mouth 2 (two) times daily. 180 tablet 3 07/28/2017 at Unknown time  . aspirin 81 MG tablet Take 81 mg by mouth daily.   07/28/2017 at Unknown time  . Cholecalciferol (VITAMIN D-3) 1000 units CAPS Take 1 capsule by mouth daily.   07/28/2017 at Unknown time  . feeding supplement, ENSURE ENLIVE, (ENSURE ENLIVE) LIQD Take 237 mLs by mouth 2 (two) times daily between meals. 90 Bottle 0 Past Month at Unknown time  . furosemide (LASIX) 40 MG tablet Take 1 tablet (40 mg total) by mouth daily as needed for fluid or edema (or more than 2-lbs of weight gain). (Patient taking differently: Take 60 mg daily by mouth. ) 225 tablet 2 07/28/2017 at Unknown time  . insulin detemir (LEVEMIR) 100 UNIT/ML injection Inject 0.2 mLs (20 Units total) into the skin at bedtime. 10 mL 11 07/27/2017 at Unknown time  . loperamide (IMODIUM) 2 MG capsule Take 1 capsule (2 mg total) by mouth 3 (three) times daily before meals.  90 capsule 0 07/28/2017 at Unknown time  . losartan (COZAAR) 100 MG tablet Take 100 mg by mouth daily.   07/28/2017 at Unknown time  . Melatonin 10 MG CAPS Take 10 mg by mouth at bedtime.   07/27/2017 at Unknown time  . OVER THE COUNTER MEDICATION OTC Sleepinal - patient takes 1 at bedtime.   07/27/2017 at Unknown time  . pantoprazole (PROTONIX) 40 MG tablet Take 1 tablet (40 mg total) by mouth daily before breakfast. 30 tablet 5 07/28/2017 at Unknown time  . pravastatin (PRAVACHOL) 40 MG tablet Take 40 mg by mouth daily.   07/27/2017 at Unknown time  . Probiotic Product (PROBIOTIC DAILY PO) Take 1 tablet daily by mouth.    07/28/2017 at Unknown time  . prochlorperazine (COMPAZINE) 10 MG tablet Take 10 mg every 6 (six) hours as needed by mouth for nausea or vomiting.    07/28/2017 at Unknown time  . Psyllium (EQ DAILY FIBER PO) Take 1 capsule every morning by mouth. Per Patient's daughter states that he takes 1 a every morning.    07/28/2017 at Unknown time  . sodium bicarbonate 650 MG tablet Take 650 mg by mouth 3 (three) times daily.    07/28/2017 at Unknown time  . vitamin B-12 (CYANOCOBALAMIN) 1000 MCG tablet Take 1 tablet (1,000 mcg total) by mouth daily. Take 2 tabs by mouth  daily. 60 tablet 5 07/28/2017 at Unknown time    Assessment: Mr. Lichtenwalner is 76 yo male with a. Fib, CKD who presented to ED with nausea/vomiting. Abdominal CT shos abdominal mass around root of the mesentery. Patient has SBO. Surgery to evaluate in AM.  Pt is on apixiban for a. Fib.  Last dose at 0700 on 11/7.  Asked to switch to lovenox for possible surgery.  With CrCl improving will adjust dose lovenox daily.    Goal of Therapy:  Monitor platelets by anticoagulation protocol: Yes   Plan:  Lovenox 1.5mg /kg 120mg  q24h F/U plan and transition back to eliquis as indicated Monitor for S/S of bleeding  Isac Sarna, BS Vena [place], BCPS Clinical Pharmacist Pager [phone] 08/08/2017,1:06 PM

## 2017-08-08 NOTE — Progress Notes (Signed)
[facility] Progress Note  6 Days Post-Op  Subjective: Doing fair. Tolerated his sips and ice with no nausea or bloating. Reports having BMs that were not recorded, one yesterday evening and one this AM. Having flatus also and feels like he will have a BM with the flatus. Still with the knee pain on the right.   Objective: Vital signs in last 24 hours: Temp:  [97.8 F (36.6 C)-98.8 F (37.1 C)] 98.8 F (37.1 C) (11/18 0512) Pulse Rate:  [64-100] 64 (11/18 0512) Resp:  [17-18] 18 (11/18 0512) BP: (108-137)/(52-75) 108/75 (11/18 0512) SpO2:  [94 %-96 %] 95 % (11/18 0512) Weight:  [183 lb 10.3 oz (83.3 kg)] 183 lb 10.3 oz (83.3 kg) (11/18 0512) Last BM Date: 08/07/17  Intake/Output from previous day: 11/17 0701 - 11/18 0700 In: 558.3 [I.V.:558.3] Out: 2450 [Urine:2450] Intake/Output this shift: Total I/O In: 0  Out: 450 [Urine:450]  General appearance: alert, cooperative and no distress Resp: normal work breathing Cardio: regular rate GI: soft, nondistended, appropriately tender, staples c/d/i with no erythema or drainage Extremities: extremities normal, atraumatic, no cyanosis or edema R knee with tenderness and some swelling   Lab Results:  Recent Labs    08/06/17 0446 08/08/17 0602  WBC 6.4 5.3  HGB 10.1* 9.0*  HCT 33.7* 30.4*  PLT 149* 150   BMET Recent Labs    08/07/17 0701 08/08/17 0602  NA 143 145  K 3.8 3.1*  CL 107 111  CO2 24 29  GLUCOSE 100* 140*  BUN 76* 63*  CREATININE 2.37* 2.12*  CALCIUM 8.4* 8.2*   Studies/Results: Dg Abd 1 View  Result Date: 08/07/2017 CLINICAL DATA:  History of bypass gastrojejunostomy. Evaluate for a air or contrast moving into small bowel EXAM: ABDOMEN - 1 VIEW COMPARISON:  Upper GI 08/06/2017 FINDINGS: Unable to assess for patency of the gastrojejunostomy. NG tube tip is present in the mid stomach. Nonobstructive bowel gas pattern noted. No free air organomegaly. Left lower pole nephrolithiasis noted.  IMPRESSION: Nonobstructive bowel gas pattern. NG tube tip remains in the mid stomach. Cannot determine patency of gastrojejunostomy on this plain film study. Electronically Signed   By: Rolm Baptise M.D.   On: 08/07/2017 07:56    Anti-infectives: Anti-infectives (From admission, onward)   Start     Dose/Rate Route Frequency Ordered Stop   08/02/17 1300  ceFAZolin (ANCEF) IVPB 2g/100 mL premix     2 g 200 mL/hr over 30 Minutes Intravenous On call to O.R. 08/02/17 1253 08/02/17 1400   07/30/17 0900  ceFAZolin (ANCEF) IVPB 2g/100 mL premix     2 g 200 mL/hr over 30 Minutes Intravenous On call to O.R. 07/30/17 0858 07/30/17 1029      Assessment/Plan: Mr. Lockridge is a82 yo with a gastric outlet obstruction and mesenteric mass,pathology confirmscarcinoid, POD6 s/p Ex lap, gastrojejunostomy, liver biopsies of nodules, small bowel resection of ischemic bowel from the mesenteric with low grade neuroendocrine tumor and metastasis to the liver.  -PRN For pain -IS, OOB, ambulate  -Patient does not like broths and soups/ greasy food. Has been taking in the chips and water, will progress to full liquids and told him to go slow and to hold off if belching or burping or distention -Cr improving -Lovenox per pharmacy for A fib, will hold on Eliquis until patient getting closer to discharge -If needed will be ok to have a short low dose steroid taper for his knee if needed, acute and short duration steroids have not  had a proven effect on wound healing   Discussed with Dr. Wynetta Emery and pharmacy   LOS: 11 days    Virl Cagey 08/08/2017

## 2017-08-08 NOTE — Progress Notes (Addendum)
PROGRESS NOTE   Stephen [name] [facility]  YNW:[id number] DOB: 02-Feb-1941 DOA: 07/28/2017 PCP: [name], MD   Brief Narrative:  76 year old man admitted from home on 11/7 due to nausea and vomiting.  History of atrial fibrillation, stroke, stage III chronic kidney disease.  CT abdomen and pelvis on admission showed an abdominal mass bowel obstruction.  As of 11/16 still no return of bowel function.  Upper GI series performed without evidence of anastomosis leak.  Surgery is following and has recommended continuation of NG tube.  Preliminary pathology with carcinoid, undetermined grade.  Assessment & Plan:   Principal Problem:   Bowel obstruction (HCC) Active Problems:   Essential hypertension   Hyperlipidemia   Coronary artery disease involving native coronary artery of native heart with angina pectoris (HCC)   CKD (chronic kidney disease) stage 3, GFR 30-59 ml/min (HCC)   PAF (paroxysmal atrial fibrillation) (HCC)   Intractable nausea and vomiting   Acute on chronic renal failure (HCC)   Gastric outlet obstruction   Neoplasm of uncertain behavior of mesentery   Preoperative cardiovascular examination   Mesenteric mass with bowel obstruction -Status post resection by Dr. Constance Haw on 11/13 with gastrojejunostomy. -Final pathology positive for metastatic neuroendocrine turmor -Oncology is on board, plan to set patient up for outpatient PET scan and further outpatient follow-up.  This has been scheduled for 11/26. -bowel function returning, NG removed 11/18.   Acute on chronic kidney disease stage III -Acute component likely due to ATN and prerenal azotemia. -Renal ultrasound without evidence for hydronephrosis or obstruction.  There was a 13 mm nonobstructing left renal stone. -Nephrology is on board, creatinine is starting to improve down to 2.37 from 3.95 on 11/15. -Baseline creatinine is around 1.9.  Hyperkalemia -Resolved  Hypokalemia  - IV replacement ordered, checking  magnesium  Chronic A. Fib -Rate controlled -Anticoagulation restarted with lovenox  Type 2 diabetes -stable, controlled.   Hyperlipidemia -Statin currently on hold due to n.p.o. state.  Right knee pain -Likely due to gout. -Significantly improved after 2 doses of Solu-Medrol given on 11/10 and 11/13. -Surgery would like to hold further Solu-Medrol doses so as to not impair bowel healing. -Rx liquid acetaminophen as needed for treating inflammation.    DVT prophylaxis: lovenox Code Status: full code Family Communication: Daughter at bedside  Disposition Plan: TBD  Consultants:   Surgery  Nephrology  Procedures:   Ex lap, gastrojejunostomy, liver biopsy of nodules, small bowel resection of ischemic bowel from the mesenteric mass on 11/12  Antimicrobials:  Anti-infectives (From admission, onward)   Start     Dose/Rate Route Frequency Ordered Stop   08/02/17 1300  ceFAZolin (ANCEF) IVPB 2g/100 mL premix     2 g 200 mL/hr over 30 Minutes Intravenous On call to O.R. 08/02/17 1253 08/02/17 1400   07/30/17 0900  ceFAZolin (ANCEF) IVPB 2g/100 mL premix     2 g 200 mL/hr over 30 Minutes Intravenous On call to O.R. 07/30/17 0858 07/30/17 1029     Subjective: Pt says that the tylenol is helping the right knee gout pain.   Objective: Vitals:   08/07/17 1300 08/07/17 1728 08/07/17 2117 08/08/17 0512  BP: 121/62 (!) 118/59 137/67 108/75  Pulse: 83 88 91 64  Resp: 17  18 18   Temp: 97.9 F (36.6 C)  97.8 F (36.6 C) 98.8 F (37.1 C)  TempSrc: Oral  Oral Oral  SpO2: 96%  94% 95%  Weight:    83.3 kg (183 lb 10.3 oz)  Height:  Intake/Output Summary (Last 24 hours) at 08/08/2017 0907 Last data filed at 08/08/2017 0651 Gross per 24 hour  Intake 558.33 ml  Output 2450 ml  Net -1891.67 ml   Filed Weights   08/06/17 0533 08/07/17 0600 08/08/17 0512  Weight: 83.9 kg (184 lb 15.5 oz) 82.7 kg (182 lb 5.1 oz) 83.3 kg (183 lb 10.3 oz)    Examination:  General  exam: Alert, awake, oriented x 3, NG tube in place Respiratory system: Clear to auscultation. Respiratory effort normal. Cardiovascular system:RRR. No murmurs, rubs, gallops. Gastrointestinal system: Abdomen is nondistended, slightly tender to palpation no organomegaly or masses felt.  Hypoactive bowel sounds Central nervous system: Alert and oriented. No focal neurological deficits. Extremities: No C/C/E, +pedal pulses, crepitus right knee, mild prepatellar effusion Skin: No rashes, lesions or ulcers Psychiatry: Judgement and insight appear normal. Mood & affect appropriate.   Data Reviewed: I have personally reviewed following labs and imaging studies  CBC: Recent Labs  Lab 08/02/17 0625 08/03/17 0504 08/04/17 0427 08/06/17 0446 08/08/17 0602  WBC 8.6 9.7 8.3 6.4 5.3  HGB 11.6* 11.6* 9.7* 10.1* 9.0*  HCT 38.1* 38.2* 32.2* 33.7* 30.4*  MCV 97.7 98.2 98.8 99.4 100.3*  PLT 228 180 157 149* 462   Basic Metabolic Panel: Recent Labs  Lab 08/04/17 0427 08/05/17 0507 08/06/17 0446 08/07/17 0701 08/08/17 0602  NA 141 143 140 143 145  K 4.0 3.7 3.3* 3.8 3.1*  CL 102 101 100* 107 111  CO2 25 29 28 24 29   GLUCOSE 142* 125* 111* 100* 140*  BUN 75* 88* 82* 76* 63*  CREATININE 3.84* 3.95* 3.03* 2.37* 2.12*  CALCIUM 8.4* 8.4* 8.1* 8.4* 8.2*  PHOS  --   --  3.7 2.9 2.2*   GFR: Estimated Creatinine Clearance: 32.5 mL/min (A) (by C-G formula based on SCr of 2.12 mg/dL (H)). Liver Function Tests: Recent Labs  Lab 08/06/17 0446 08/07/17 0701 08/08/17 0602  ALBUMIN 2.6* 2.8* 2.6*   No results for input(s): LIPASE, AMYLASE in the last 168 hours. No results for input(s): AMMONIA in the last 168 hours. Coagulation Profile: No results for input(s): INR, PROTIME in the last 168 hours. Cardiac Enzymes: No results for input(s): CKTOTAL, CKMB, CKMBINDEX, TROPONINI in the last 168 hours. BNP (last 3 results) No results for input(s): PROBNP in the last 8760 hours. HbA1C: No results  for input(s): HGBA1C in the last 72 hours. CBG: Recent Labs  Lab 08/02/17 1132 08/02/17 1532  GLUCAP 105* 148*   Lipid Profile: No results for input(s): CHOL, HDL, LDLCALC, TRIG, CHOLHDL, LDLDIRECT in the last 72 hours. Thyroid Function Tests: No results for input(s): TSH, T4TOTAL, FREET4, T3FREE, THYROIDAB in the last 72 hours. Anemia Panel: No results for input(s): VITAMINB12, FOLATE, FERRITIN, TIBC, IRON, RETICCTPCT in the last 72 hours. Urine analysis:    Component Value Date/Time   COLORURINE YELLOW 07/28/2017 1511   APPEARANCEUR HAZY (A) 07/28/2017 1511   LABSPEC 1.017 07/28/2017 1511   PHURINE 5.0 07/28/2017 1511   GLUCOSEU NEGATIVE 07/28/2017 1511   HGBUR MODERATE (A) 07/28/2017 1511   BILIRUBINUR NEGATIVE 07/28/2017 1511   KETONESUR NEGATIVE 07/28/2017 1511   PROTEINUR NEGATIVE 07/28/2017 1511   UROBILINOGEN 0.2 07/05/2008 0922   NITRITE NEGATIVE 07/28/2017 1511   LEUKOCYTESUR SMALL (A) 07/28/2017 1511   No results found for this or any previous visit (from the past 240 hour(s)).   Radiology Studies: Dg Abd 1 View  Result Date: 08/07/2017 CLINICAL DATA:  History of bypass gastrojejunostomy. Evaluate for a air  or contrast moving into small bowel EXAM: ABDOMEN - 1 VIEW COMPARISON:  Upper GI 08/06/2017 FINDINGS: Unable to assess for patency of the gastrojejunostomy. NG tube tip is present in the mid stomach. Nonobstructive bowel gas pattern noted. No free air organomegaly. Left lower pole nephrolithiasis noted. IMPRESSION: Nonobstructive bowel gas pattern. NG tube tip remains in the mid stomach. Cannot determine patency of gastrojejunostomy on this plain film study. Electronically Signed   By: Rolm Baptise M.D.   On: 08/07/2017 07:56   Dg Ugi  W/kub  Result Date: 08/06/2017 CLINICAL DATA:  Recent bypass gastric jejunostomy. 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 Evaluate for leak EXAM: WATER SOLUBLE UPPER GI SERIES TECHNIQUE: Single-column upper GI series was  performed using water soluble contrast. CONTRAST:  350 mL p.O. <See Chart> ISOVUE-300 IOPAMIDOL (ISOVUE-300) INJECTION 61% COMPARISON:  CT 07/28/2017 FLUOROSCOPY TIME:  Fluoroscopy Time:  3 minutes 18 seconds Radiation Exposure Index (if provided by the fluoroscopic device): Number of Acquired Spot Images: 0 FINDINGS: Isovue contrast was injected through the NG tube to fill the stomach. Stomach is normal in size and contour. Stomach empties into the duodenum. The duodenum appears obstructed in the third portion as noted on prior CT due to tumor in the mesenteric. No filling was seen in the gastrojejunostomy. Patient was positioned right lateral, left lateral, and supine, and with delayed imaging there was no filling of the gastrojejunostomy. On the final AP image, there appears to be some edema along the greater curve of the stomach which may be the anastomosis however no significant contrast is noted in the gastric jejunostomy. This could be positional or due to edema from recent surgery. Negative for extravasation of contrast.  No leak. IMPRESSION: The stomach was filled with contrast. There is obstruction of the third portion the duodenum due to tumor. No filling of the gastric jejunostomy. This could be due to edema at the anastomosis. Negative for leak. Electronically Signed   By: Franchot Gallo M.D.   On: 08/06/2017 10:48   Scheduled Meds: . acetaminophen  650 mg Oral Q6H  . enoxaparin (LOVENOX) injection  1 mg/kg Subcutaneous Q24H  . furosemide  40 mg Intravenous BID  . metoprolol tartrate  2.5 mg Intravenous Q6H  . sodium chloride flush  3 mL Intravenous Q12H   Continuous Infusions: . dextrose 5 % and 0.9% NaCl 100 mL/hr at 08/08/17 0546  . potassium chloride       LOS: 11 days   Time spent: 25 minutes.  Irwin Brakeman, MD [facility] Pager [phone]  If 7PM-7AM, please contact night-coverage [email or web address] Password [email or web address] 08/08/2017, 9:07 AM

## 2017-08-09 ENCOUNTER — Telehealth (INDEPENDENT_AMBULATORY_CARE_PROVIDER_SITE_OTHER): Payer: Self-pay | Admitting: *Deleted

## 2017-08-09 LAB — RENAL FUNCTION PANEL
ALBUMIN: 2.8 g/dL — AB (ref 3.5–5.0)
ANION GAP: 7 (ref 5–15)
BUN: 57 mg/dL — ABNORMAL HIGH (ref 6–20)
CALCIUM: 8.4 mg/dL — AB (ref 8.9–10.3)
CO2: 27 mmol/L (ref 22–32)
CREATININE: 2.22 mg/dL — AB (ref 0.61–1.24)
Chloride: 107 mmol/L (ref 101–111)
GFR calc non Af Amer: 27 mL/min — ABNORMAL LOW (ref >60)
GFR, EST AFRICAN AMERICAN: 31 mL/min — AB (ref >60)
GLUCOSE: 127 mg/dL — AB (ref 65–99)
PHOSPHORUS: 2.1 mg/dL — AB (ref 2.5–4.6)
Potassium: 4.2 mmol/L (ref 3.5–5.1)
SODIUM: 141 mmol/L (ref 135–145)

## 2017-08-09 LAB — BASIC METABOLIC PANEL
ANION GAP: 7 (ref 5–15)
BUN: 59 mg/dL — ABNORMAL HIGH (ref 6–20)
CALCIUM: 8.4 mg/dL — AB (ref 8.9–10.3)
CO2: 27 mmol/L (ref 22–32)
CREATININE: 2.22 mg/dL — AB (ref 0.61–1.24)
Chloride: 108 mmol/L (ref 101–111)
GFR, EST AFRICAN AMERICAN: 31 mL/min — AB (ref >60)
GFR, EST NON AFRICAN AMERICAN: 27 mL/min — AB (ref >60)
Glucose, Bld: 129 mg/dL — ABNORMAL HIGH (ref 65–99)
Potassium: 4 mmol/L (ref 3.5–5.1)
SODIUM: 142 mmol/L (ref 135–145)

## 2017-08-09 LAB — CBC
HCT: 33.5 % — ABNORMAL LOW (ref 39.0–52.0)
HEMOGLOBIN: 9.7 g/dL — AB (ref 13.0–17.0)
MCH: 29.2 pg (ref 26.0–34.0)
MCHC: 29 g/dL — ABNORMAL LOW (ref 30.0–36.0)
MCV: 100.9 fL — ABNORMAL HIGH (ref 78.0–100.0)
PLATELETS: 175 10*3/uL (ref 150–400)
RBC: 3.32 MIL/uL — AB (ref 4.22–5.81)
RDW: 17.8 % — ABNORMAL HIGH (ref 11.5–15.5)
WBC: 6.8 10*3/uL (ref 4.0–10.5)

## 2017-08-09 LAB — MAGNESIUM: MAGNESIUM: 1.9 mg/dL (ref 1.7–2.4)

## 2017-08-09 MED ORDER — PRAVASTATIN SODIUM 40 MG PO TABS
40.0000 mg | ORAL_TABLET | Freq: Every day | ORAL | Status: DC
  Administered 2017-08-09: 40 mg via ORAL
  Filled 2017-08-09: qty 1

## 2017-08-09 MED ORDER — ADULT MULTIVITAMIN W/MINERALS CH
1.0000 | ORAL_TABLET | Freq: Every day | ORAL | Status: DC
  Administered 2017-08-09 – 2017-08-10 (×2): 1 via ORAL
  Filled 2017-08-09 (×2): qty 1

## 2017-08-09 MED ORDER — ATENOLOL 25 MG PO TABS
12.5000 mg | ORAL_TABLET | Freq: Every day | ORAL | Status: DC
  Administered 2017-08-09 – 2017-08-10 (×2): 12.5 mg via ORAL
  Filled 2017-08-09 (×2): qty 1

## 2017-08-09 MED ORDER — ALUM & MAG HYDROXIDE-SIMETH 200-200-20 MG/5ML PO SUSP
30.0000 mL | ORAL | Status: DC | PRN
  Administered 2017-08-09 (×2): 30 mL via ORAL
  Filled 2017-08-09 (×2): qty 30

## 2017-08-09 MED ORDER — METHYLPREDNISOLONE SODIUM SUCC 125 MG IJ SOLR
60.0000 mg | Freq: Once | INTRAMUSCULAR | Status: AC
  Administered 2017-08-09: 60 mg via INTRAVENOUS
  Filled 2017-08-09: qty 2

## 2017-08-09 MED ORDER — ENSURE ENLIVE PO LIQD
237.0000 mL | Freq: Two times a day (BID) | ORAL | Status: DC
  Administered 2017-08-09 – 2017-08-10 (×2): 237 mL via ORAL

## 2017-08-09 MED ORDER — DOCUSATE SODIUM 100 MG PO CAPS
100.0000 mg | ORAL_CAPSULE | Freq: Two times a day (BID) | ORAL | Status: DC
  Administered 2017-08-09 – 2017-08-10 (×2): 100 mg via ORAL
  Filled 2017-08-09 (×3): qty 1

## 2017-08-09 MED ORDER — ALUM & MAG HYDROXIDE-SIMETH 200-200-20 MG/5ML PO SUSP: 15.0000 mL | ORAL | Status: DC | PRN

## 2017-08-09 NOTE — Progress Notes (Signed)
[facility] Progress Note  7 Days Post-Op  Subjective: No major issues. Having flatus and BMs. Tolerated the the full liquids. Wanting more to eat. Cr down and foley out. R knee hurting.   Objective: Vital signs in last 24 hours: Temp:  [96.9 F (36.1 C)-98.4 F (36.9 C)] 98.4 F (36.9 C) (11/19 6644) Pulse Rate:  [72-104] 72 (11/19 0614) Resp:  [18] 18 (11/19 0614) BP: (100-124)/(54-93) 109/62 (11/19 0614) SpO2:  [95 %-100 %] 96 % (11/19 0614) Last BM Date: 08/08/17  Intake/Output from previous day: 11/18 0701 - 11/19 0700 In: 1011.3 [P.O.:240; I.V.:771.3] Out: 450 [Urine:450] Intake/Output this shift: No intake/output data recorded.  General appearance: alert, cooperative and no distress Resp: normal work breathing GI: soft, non-tender; bowel sounds normal; no masses,  no organomegaly Extremities: right knee swelling and tenderness  Lab Results:  Recent Labs    08/08/17 0602 08/09/17 0512  WBC 5.3 6.8  HGB 9.0* 9.7*  HCT 30.4* 33.5*  PLT 150 175   BMET Recent Labs    08/08/17 0602 08/09/17 0512  NA 145 141  142  K 3.1* 4.2  4.0  CL 111 107  108  CO2 29 27  27   GLUCOSE 140* 127*  129*  BUN 63* 57*  59*  CREATININE 2.12* 2.22*  2.22*  CALCIUM 8.2* 8.4*  8.4*     Assessment/Plan: Stephen [name] is a19 yo with a gastric outlet obstruction and mesenteric mass,pathology confirmscarcinoid, POD 7 s/p Ex lap, gastrojejunostomy, liver biopsies of nodules, small bowel resection of ischemic bowel from the mesenteric with low grade neuroendocrine tumor and metastasis to the liver. -PRN For pain -IS, OOB, ambulate -Soft diet -Can have short steroid taper if needed for knee. -Can start back eliquis at discharge -Hoping for discharge tomorrow.   Discussed with Dr. Wynetta Emery.    LOS: 12 days    Virl Cagey 08/09/2017

## 2017-08-09 NOTE — Progress Notes (Signed)
Subjective: Interval History: Patient continues to feel better.  He is able to pass some gas yesterday this morning.  Denies any difficulty breathing.    Objective: Vital signs in last 24 hours: Temp:  [96.9 F (36.1 C)-98.4 F (36.9 C)] 98.4 F (36.9 C) (11/19 9622) Pulse Rate:  [72-104] 72 (11/19 0614) Resp:  [18] 18 (11/19 0614) BP: (100-124)/(54-93) 109/62 (11/19 0614) SpO2:  [95 %-100 %] 96 % (11/19 2979) Weight change:   Intake/Output from previous day: 11/18 0701 - 11/19 0700 In: 1011.3 [P.O.:240; I.V.:771.3] Out: 450 [Urine:450] Intake/Output this shift: No intake/output data recorded.  General appearance: alert, cooperative and no distress Resp: clear to auscultation bilaterally Cardio: regular rate and rhythm GI: abnormal findings:  Positive bowel sound Extremities: No edema  Lab Results: Recent Labs    08/08/17 0602 08/09/17 0512  WBC 5.3 6.8  HGB 9.0* 9.7*  HCT 30.4* 33.5*  PLT 150 175   BMET:  Recent Labs    08/08/17 0602 08/09/17 0512  NA 145 141  142  K 3.1* 4.2  4.0  CL 111 107  108  CO2 29 27  27   GLUCOSE 140* 127*  129*  BUN 63* 57*  59*  CREATININE 2.12* 2.22*  2.22*  CALCIUM 8.2* 8.4*  8.4*   No results for input(s): PTH in the last 72 hours. Iron Studies: No results for input(s): IRON, TIBC, TRANSFERRIN, FERRITIN in the last 72 hours.  Studies/Results: No results found.  I have reviewed the patient's current medications.  Assessment/Plan: Problem #1 acute kidney injury superimposed on chronic.  Etiology was thought to be secondary to prerenal versus ATN.   His creatinine is 2.22.  His renal function has returned to his baseline. Problem #2 chronic renal failure: Stage IV.  Thought to be secondary to hypertension/diabetes/recurrent acute kidney injury/age-related renal function loss.  Ultrasound of the kidneys showed right kidney to be 10.5 left kidney 11.4 and  no hydronephrosis. 3] hypokalemia: Patient is on potassium  supplement and his potassium is normal. 4] anemia: His hemoglobin is stable: His hemoglobin is below target goal. 5] bone and mineral disorder: His calcium and phosphorus is range 6] history of coronary artery disease: No chest pain. 7] history of mesenteric mass status post surgery.  Presently able to tolerate clear liquid diet.  And is passing gas. Plan: 1] will DC IV fluid 2] encourage p.o. fluid intake. 3] we will check renal panel in the morning. 4] if patient is going to discharge we will see him in 4 weeks.   LOS: 12 days  Sharlot Sturkey S 08/09/2017,8:14 AM

## 2017-08-09 NOTE — Plan of Care (Signed)
Pt is progressing 

## 2017-08-09 NOTE — Telephone Encounter (Signed)
Amber from [facility] called to make [place] aware that the caregiver,Robin had called and cancelled the PET SCAN 08/10/2017 ,due to the patient currently being in the hospital.  She ask that if it needs to be rescheduled call [phone].

## 2017-08-09 NOTE — Progress Notes (Signed)
PROGRESS NOTE   Toni Hoffmeister [facility]  VOZ:[id number] DOB: 21-Apr-1941 DOA: 07/28/2017 PCP: [name], MD   Brief Narrative:  76 year old man admitted from home on 11/7 due to nausea and vomiting.  History of atrial fibrillation, stroke, stage III chronic kidney disease.  CT abdomen and pelvis on admission showed an abdominal mass bowel obstruction.  As of 11/16 still no return of bowel function.  Upper GI series performed without evidence of anastomosis leak.  Surgery is following and has recommended continuation of NG tube.  Preliminary pathology with carcinoid, undetermined grade.  Assessment & Plan:   Principal Problem:   Bowel obstruction (HCC) Active Problems:   Essential hypertension   Hyperlipidemia   Coronary artery disease involving native coronary artery of native heart with angina pectoris (HCC)   CKD (chronic kidney disease) stage 3, GFR 30-59 ml/min (HCC)   PAF (paroxysmal atrial fibrillation) (HCC)   Intractable nausea and vomiting   Acute on chronic renal failure (HCC)   Gastric outlet obstruction   Neoplasm of uncertain behavior of mesentery   Preoperative cardiovascular examination  Mesenteric mass with bowel obstruction -Status post resection by Dr. Constance Haw on 11/13 with gastrojejunostomy. -Final pathology positive for metastatic neuroendocrine turmor -Oncology is on board, plan to set patient up for outpatient PET scan and further outpatient follow-up.  This has been scheduled for 11/26. -bowel function returning, NG removed 11/18.  -Advancing diet to soft diet  Acute on chronic kidney disease stage III -Acute component likely due to ATN and prerenal azotemia. -Renal ultrasound without evidence for hydronephrosis or obstruction.  There was a 13 mm nonobstructing left renal stone. -Nephrology is on board, creatinine is starting to improve down to 2.22 from 3.95 on 11/15. -Baseline creatinine is around 1.9.  Hyperkalemia -Resolved  Hypokalemia  -  repleted.  Hypomagnesemia - repleted  Chronic A. Fib -Rate controlled -Anticoagulation restarted with lovenox, discharge home on oral eliquis tomorrow -stop IV lopressor and start atenolol 12.5 mg po daily.   Type 2 diabetes -stable, controlled.   Hyperlipidemia -resume home statin today.   Right knee pain -Likely due to gout and prepatellar bursitis -Significantly improved after 2 doses of Solu-Medrol given on 11/10 and 11/13. -Rx liquid acetaminophen as needed for treating inflammation.  - I spoke with surgery, ok to do short course of steroids.  - will give IV solumedrol today and home on short course of oral steroids.   DVT prophylaxis: lovenox Code Status: full code Family Communication: Daughter at bedside  Disposition Plan: home tomorrow if stable  Consultants:   Surgery  Nephrology  Procedures:   Ex lap, gastrojejunostomy, liver biopsy of nodules, small bowel resection of ischemic bowel from the mesenteric mass on 11/12  Antimicrobials:  Anti-infectives (From admission, onward)   Start     Dose/Rate Route Frequency Ordered Stop   08/02/17 1300  ceFAZolin (ANCEF) IVPB 2g/100 mL premix     2 g 200 mL/hr over 30 Minutes Intravenous On call to O.R. 08/02/17 1253 08/02/17 1400   07/30/17 0900  ceFAZolin (ANCEF) IVPB 2g/100 mL premix     2 g 200 mL/hr over 30 Minutes Intravenous On call to O.R. 07/30/17 0858 07/30/17 1029     Subjective: Pt having right knee pain and swelling otherwise doing well.   Objective: Vitals:   08/08/17 1846 08/08/17 2103 08/08/17 2125 08/09/17 0614  BP: 106/63  (!) 122/93 109/62  Pulse: 93  (!) 104 72  Resp: 18  18 18   Temp: 98.1 F (36.7 C)  (!)  96.9 F (36.1 C) 98.4 F (36.9 C)  TempSrc: Oral  Oral Oral  SpO2: 98% 95% 98% 96%  Weight:      Height:        Intake/Output Summary (Last 24 hours) at 08/09/2017 0901 Last data filed at 08/09/2017 0300 Gross per 24 hour  Intake 1011.25 ml  Output 450 ml  Net 561.25 ml    Filed Weights   08/06/17 0533 08/07/17 0600 08/08/17 0512  Weight: 83.9 kg (184 lb 15.5 oz) 82.7 kg (182 lb 5.1 oz) 83.3 kg (183 lb 10.3 oz)    Examination:  General exam: Alert, awake, oriented x 3. Sitting up in bed.  Respiratory system: Clear to auscultation. Respiratory effort normal. Cardiovascular system: normal s1,s2 sounds.  No murmurs, rubs, gallops. Gastrointestinal system: Abdomen is nondistended, slightly tender to palpation no organomegaly or masses felt.  Active BS.  Central nervous system: Alert and oriented. No focal neurological deficits. Extremities: No C/C/E, +pedal pulses, crepitus right knee, mild prepatellar effusion Skin: No rashes, lesions or ulcers Psychiatry: Judgement and insight appear normal. Mood & affect appropriate.   Data Reviewed: I have personally reviewed following labs and imaging studies  CBC: Recent Labs  Lab 08/03/17 0504 08/04/17 0427 08/06/17 0446 08/08/17 0602 08/09/17 0512  WBC 9.7 8.3 6.4 5.3 6.8  HGB 11.6* 9.7* 10.1* 9.0* 9.7*  HCT 38.2* 32.2* 33.7* 30.4* 33.5*  MCV 98.2 98.8 99.4 100.3* 100.9*  PLT 180 157 149* 150 562   Basic Metabolic Panel: Recent Labs  Lab 08/05/17 0507 08/06/17 0446 08/07/17 0701 08/08/17 0602 08/09/17 0512  NA 143 140 143 145 141  142  K 3.7 3.3* 3.8 3.1* 4.2  4.0  CL 101 100* 107 111 107  108  CO2 29 28 24 29 27  27   GLUCOSE 125* 111* 100* 140* 127*  129*  BUN 88* 82* 76* 63* 57*  59*  CREATININE 3.95* 3.03* 2.37* 2.12* 2.22*  2.22*  CALCIUM 8.4* 8.1* 8.4* 8.2* 8.4*  8.4*  MG  --   --   --  1.5* 1.9  PHOS  --  3.7 2.9 2.2* 2.1*   GFR: Estimated Creatinine Clearance: 31.1 mL/min (A) (by C-G formula based on SCr of 2.22 mg/dL (H)). Liver Function Tests: Recent Labs  Lab 08/06/17 0446 08/07/17 0701 08/08/17 0602 08/09/17 0512  ALBUMIN 2.6* 2.8* 2.6* 2.8*   No results for input(s): LIPASE, AMYLASE in the last 168 hours. No results for input(s): AMMONIA in the last 168  hours. Coagulation Profile: No results for input(s): INR, PROTIME in the last 168 hours. Cardiac Enzymes: No results for input(s): CKTOTAL, CKMB, CKMBINDEX, TROPONINI in the last 168 hours. BNP (last 3 results) No results for input(s): PROBNP in the last 8760 hours. HbA1C: No results for input(s): HGBA1C in the last 72 hours. CBG: Recent Labs  Lab 08/02/17 1132 08/02/17 1532  GLUCAP 105* 148*   Lipid Profile: No results for input(s): CHOL, HDL, LDLCALC, TRIG, CHOLHDL, LDLDIRECT in the last 72 hours. Thyroid Function Tests: No results for input(s): TSH, T4TOTAL, FREET4, T3FREE, THYROIDAB in the last 72 hours. Anemia Panel: No results for input(s): VITAMINB12, FOLATE, FERRITIN, TIBC, IRON, RETICCTPCT in the last 72 hours. Urine analysis:    Component Value Date/Time   COLORURINE YELLOW 07/28/2017 1511   APPEARANCEUR HAZY (A) 07/28/2017 1511   LABSPEC 1.017 07/28/2017 1511   PHURINE 5.0 07/28/2017 1511   GLUCOSEU NEGATIVE 07/28/2017 1511   HGBUR MODERATE (A) 07/28/2017 1511   BILIRUBINUR NEGATIVE 07/28/2017 1511  KETONESUR NEGATIVE 07/28/2017 1511   PROTEINUR NEGATIVE 07/28/2017 1511   UROBILINOGEN 0.2 07/05/2008 0922   NITRITE NEGATIVE 07/28/2017 1511   LEUKOCYTESUR SMALL (A) 07/28/2017 1511   No results found for this or any previous visit (from the past 240 hour(s)).   Radiology Studies: No results found. Scheduled Meds: . docusate sodium  100 mg Oral BID  . enoxaparin (LOVENOX) injection  120 mg Subcutaneous Q24H  . methylPREDNISolone (SOLU-MEDROL) injection  60 mg Intravenous Once  . metoprolol tartrate  2.5 mg Intravenous Q6H  . sodium chloride flush  3 mL Intravenous Q12H   Continuous Infusions:    LOS: 12 days   Time spent: 25 minutes.  Irwin Brakeman, MD [facility] Pager [phone]  If 7PM-7AM, please contact night-coverage [email or web address] Password [email or web address] 08/09/2017, 9:01 AM

## 2017-08-10 ENCOUNTER — Encounter (HOSPITAL_COMMUNITY): Payer: PPO

## 2017-08-10 LAB — RENAL FUNCTION PANEL
Albumin: 2.8 g/dL — ABNORMAL LOW (ref 3.5–5.0)
Anion gap: 8 (ref 5–15)
BUN: 63 mg/dL — AB (ref 6–20)
CHLORIDE: 106 mmol/L (ref 101–111)
CO2: 23 mmol/L (ref 22–32)
Calcium: 8.5 mg/dL — ABNORMAL LOW (ref 8.9–10.3)
Creatinine, Ser: 2.35 mg/dL — ABNORMAL HIGH (ref 0.61–1.24)
GFR calc Af Amer: 29 mL/min — ABNORMAL LOW (ref >60)
GFR calc non Af Amer: 25 mL/min — ABNORMAL LOW (ref >60)
GLUCOSE: 141 mg/dL — AB (ref 65–99)
POTASSIUM: 4.2 mmol/L (ref 3.5–5.1)
Phosphorus: 2.4 mg/dL — ABNORMAL LOW (ref 2.5–4.6)
Sodium: 137 mmol/L (ref 135–145)

## 2017-08-10 MED ORDER — ADULT MULTIVITAMIN W/MINERALS CH: 1.0000 | ORAL_TABLET | Freq: Every day | ORAL | Status: DC

## 2017-08-10 MED ORDER — LOSARTAN POTASSIUM 25 MG PO TABS: 25.0000 mg | ORAL_TABLET | Freq: Every day | ORAL | 0 refills | Status: DC

## 2017-08-10 MED ORDER — DOCUSATE SODIUM 100 MG PO CAPS: 100.0000 mg | ORAL_CAPSULE | Freq: Every day | ORAL | 0 refills | Status: DC

## 2017-08-10 MED ORDER — ATENOLOL 25 MG PO TABS: 12.5000 mg | ORAL_TABLET | Freq: Every day | ORAL | 0 refills | Status: DC

## 2017-08-10 MED ORDER — PREDNISONE 20 MG PO TABS: 20.0000 mg | ORAL_TABLET | Freq: Every day | ORAL | 0 refills | Status: AC

## 2017-08-10 MED ORDER — INSULIN DETEMIR 100 UNIT/ML ~~LOC~~ SOLN: 10.0000 [IU] | Freq: Every day | SUBCUTANEOUS | 11 refills | Status: DC

## 2017-08-10 NOTE — Care Management Important Message (Signed)
Important Message  Patient Details  Name: Stephen [name] MRN: [id number] Date of Birth: Feb 17, 1941   Medicare Important Message Given:  Yes    Grantham Hippert, Chauncey Reading, RN 08/10/2017, 11:13 AM

## 2017-08-10 NOTE — Discharge Summary (Signed)
Physician Discharge Summary  Stephen [name] [facility] DVV:[id number] DOB: 17-Nov-1940 DOA: 07/28/2017  PCP: [name], MD Surgery: Dr. Constance Haw Oncologist: Dr. Talbert Cage Nephrologist: Dr. Lowanda Foster  Admit date: 07/28/2017 Discharge date: 08/10/2017  Admitted From: Home  Disposition: Home   Recommendations for Outpatient Follow-up:  1. Follow up with oncologist on 08/16/17 as scheduled 2. Follow up with surgery in 1-2 weeks 3. Please obtain BMP/CBC in one week 4. Please follow up on the following pending results: final pathology results  Discharge Condition: STABLE   CODE STATUS: DNR   Brief Hospitalization Summary: Please see all hospital notes, images, labs for full details of the hospitalization.  HPI: Stephen [name] is a 76 y.o. male with past medical history significant for atrial fibrillation, stroke, CKD 3 and anemia presents the emergency room with chief complaint of nausea vomiting.  Patient states he has had several weeks of diarrhea and recently had an admission for workup and hydration.  Patient has had continued symptoms as an outpatient.  Had brief 1 day reprieve from diarrhea and the next day started to have significant nausea and vomiting.  Due to  multiple episodes of nausea vomiting patient came to the emergency room for evaluation.  ED course: Patient given IV fluids for hydration.  CT of the abdomen and pelvis showed abdominal mass around the root of the mesentery.  EDP spoke to general surgeon on call who will eval patient in the morning for possible procedure.  Hospitalist consulted for admission.  Brief Narrative:  76 year old man admitted from home on 11/7 due to nausea and vomiting.  History of atrial fibrillation, stroke, stage III chronic kidney disease.  CT abdomen and pelvis on admission showed an abdominal mass bowel obstruction.  As of 11/16 still no return of bowel function.  Upper GI series performed without evidence of anastomosis leak.  Surgery is following and has  recommended continuation of NG tube.  Preliminary pathology with carcinoid, undetermined grade.  Assessment & Plan:   Principal Problem:   Bowel obstruction   Essential hypertension   Hyperlipidemia   Coronary artery disease involving native coronary artery of native heart with angina pectoris (HCC)   CKD (chronic kidney disease) stage 3, GFR 30-59 ml/min (HCC)   PAF (paroxysmal atrial fibrillation) (HCC)   Intractable nausea and vomiting   Acute on chronic renal failure (HCC)   Gastric outlet obstruction   Neoplasm of uncertain behavior of mesentery   Preoperative cardiovascular examination  Mesenteric mass with bowel obstruction -Status post resection by Dr. Constance Haw on 11/13 with gastrojejunostomy. -Final pathology positive for metastatic neuroendocrine turmor -Oncology is on board, plan to set patient up for outpatient PET scan and further outpatient follow-up.  This has been scheduled for 11/26. -bowel function returning, NG removed 11/18. Tolerated soft diet.  -per surgery ok to discharge home 11/20. -Follow up with surgery in 1-2 weeks  Acute on chronic kidney disease stage III -Acute component likely due to ATN and prerenal azotemia. -Renal ultrasound without evidence for hydronephrosis or obstruction.  There was a 13 mm nonobstructing left renal stone. -Nephrology is on board, creatinine is starting to improve down to 2.22 from 3.95 on 11/15. -Baseline creatinine is around 2-2.5  Hyperkalemia -Resolved  Hypokalemia  - repleted.  Hypomagnesemia - repleted  Chronic A. Fib -Rate controlled -Anticoagulation restarted with lovenox, discharge home on oral eliquis tomorrow -stop IV lopressor and started atenolol 12.5 mg po daily.   Type 2 diabetes -stable, controlled.  Resume home insulin at lower dose as  he is not eating as much   Hyperlipidemia -resumed home statin    Right knee pain -Likely due to gout and prepatellar bursitis -Significantly improved  after 2 doses of Solu-Medrol given on 11/10 and 11/13. -Rx liquid acetaminophen as needed for treating inflammation.  - I spoke with surgery, ok to do short course of steroids.  - improved after IV solumedrol and sending home on short course of oral steroids.   DVT prophylaxis: lovenox Code Status: full code Family Communication: Daughter at bedside  Disposition Plan: home  Consultants:   Surgery  Nephrology  Procedures:   Ex lap, gastrojejunostomy, liver biopsy of nodules, small bowel resection of ischemic bowel from the mesenteric mass on 11/12   Discharge Diagnoses:  Principal Problem:   Bowel obstruction (HCC) Active Problems:   Essential hypertension   Hyperlipidemia   Coronary artery disease involving native coronary artery of native heart with angina pectoris (HCC)   CKD (chronic kidney disease) stage 3, GFR 30-59 ml/min (HCC)   PAF (paroxysmal atrial fibrillation) (HCC)   Intractable nausea and vomiting   Acute on chronic renal failure (HCC)   Gastric outlet obstruction   Neoplasm of uncertain behavior of mesentery   Preoperative cardiovascular examination  Discharge Instructions: Discharge Instructions    Call MD for:  difficulty breathing, headache or visual disturbances   Complete by:  As directed    Call MD for:  extreme fatigue   Complete by:  As directed    Call MD for:  hives   Complete by:  As directed    Call MD for:  persistant dizziness or light-headedness   Complete by:  As directed    Call MD for:  persistant nausea and vomiting   Complete by:  As directed    Call MD for:  severe uncontrolled pain   Complete by:  As directed    Increase activity slowly   Complete by:  As directed      Allergies as of 08/10/2017   No Known Allergies     Medication List    STOP taking these medications   loperamide 2 MG capsule Commonly known as:  IMODIUM     TAKE these medications   acetaminophen 500 MG tablet Commonly known as:  TYLENOL Take  500 mg by mouth every 6 (six) hours as needed.   allopurinol 100 MG tablet Commonly known as:  ZYLOPRIM Take 200 mg by mouth daily.   apixaban 5 MG Tabs tablet Commonly known as:  ELIQUIS Take 1 tablet (5 mg total) by mouth 2 (two) times daily.   aspirin 81 MG tablet Take 81 mg by mouth daily.   atenolol 25 MG tablet Commonly known as:  TENORMIN Take 0.5 tablets (12.5 mg total) by mouth daily. Start taking on:  08/11/2017   docusate sodium 100 MG capsule Commonly known as:  COLACE Take 1 capsule (100 mg total) by mouth daily.   EQ DAILY FIBER PO Take 1 capsule every morning by mouth. Per Patient's daughter states that he takes 1 a every morning.   feeding supplement (ENSURE ENLIVE) Liqd Take 237 mLs by mouth 2 (two) times daily between meals.   furosemide 40 MG tablet Commonly known as:  LASIX Take 1 tablet (40 mg total) by mouth daily as needed for fluid or edema (or more than 2-lbs of weight gain). What changed:    how much to take  when to take this   insulin detemir 100 UNIT/ML injection Commonly known as:  LEVEMIR  Inject 0.1 mLs (10 Units total) into the skin at bedtime. What changed:  how much to take   losartan 25 MG tablet Commonly known as:  COZAAR Take 1 tablet (25 mg total) by mouth daily. Start taking on:  08/11/2017 What changed:    medication strength  how much to take   Melatonin 10 MG Caps Take 10 mg by mouth at bedtime.   multivitamin with minerals Tabs tablet Take 1 tablet by mouth daily. Start taking on:  08/11/2017   OVER THE COUNTER MEDICATION OTC Sleepinal - patient takes 1 at bedtime.   pantoprazole 40 MG tablet Commonly known as:  PROTONIX Take 1 tablet (40 mg total) by mouth daily before breakfast.   pravastatin 40 MG tablet Commonly known as:  PRAVACHOL Take 40 mg by mouth daily.   predniSONE 20 MG tablet Commonly known as:  DELTASONE Take 1 tablet (20 mg total) by mouth daily with breakfast for 5 days.   PROBIOTIC  DAILY PO Take 1 tablet daily by mouth.   prochlorperazine 10 MG tablet Commonly known as:  COMPAZINE Take 10 mg every 6 (six) hours as needed by mouth for nausea or vomiting.   sodium bicarbonate 650 MG tablet Take 650 mg by mouth 3 (three) times daily.   vitamin B-12 1000 MCG tablet Commonly known as:  CYANOCOBALAMIN Take 1 tablet (1,000 mcg total) by mouth daily. Take 2 tabs by mouth daily.   Vitamin D-3 1000 units Caps Take 1 capsule by mouth daily.      Follow-up Information    Fran Lowes, MD Follow up in 4 week(s).   Specialty:  Nephrology Contact information: [address] [place] [address] [phone]        [name], MD. Schedule an appointment as soon as possible for a visit in 2 week(s).   Specialty:  Internal Medicine Contact information: [address] [place] [address] [phone]        Virl Cagey, MD. Schedule an appointment as soon as possible for a visit in 2 week(s).   Specialty:  General Surgery Contact information: [address] [place] [facility] [address] [phone]        [place]. Go on 08/16/2017.   Specialty:  Oncology Why:  as scheduled Contact information: [address] [id number] [place] [phone]         No Known Allergies Current Discharge Medication List    START taking these medications   Details  atenolol (TENORMIN) 25 MG tablet Take 0.5 tablets (12.5 mg total) by mouth daily. Qty: 15 tablet, Refills: 0    docusate sodium (COLACE) 100 MG capsule Take 1 capsule (100 mg total) by mouth daily. Qty: 10 capsule, Refills: 0    Multiple Vitamin (MULTIVITAMIN WITH MINERALS) TABS tablet Take 1 tablet by mouth daily.    predniSONE (DELTASONE) 20 MG tablet Take 1 tablet (20 mg total) by mouth daily with breakfast for 5 days. Qty: 5 tablet, Refills: 0      CONTINUE these medications which have CHANGED   Details   insulin detemir (LEVEMIR) 100 UNIT/ML injection Inject 0.1 mLs (10 Units total) into the skin at bedtime. Qty: 10 mL, Refills: 11    losartan (COZAAR) 25 MG tablet Take 1 tablet (25 mg total) by mouth daily. Qty: 30 tablet, Refills: 0      CONTINUE these medications which have NOT CHANGED   Details  acetaminophen (TYLENOL) 500 MG tablet Take 500 mg by  mouth every 6 (six) hours as needed.    allopurinol (ZYLOPRIM) 100 MG tablet Take 200 mg by mouth daily.     apixaban (ELIQUIS) 5 MG TABS tablet Take 1 tablet (5 mg total) by mouth 2 (two) times daily. Qty: 180 tablet, Refills: 3    aspirin 81 MG tablet Take 81 mg by mouth daily.    Cholecalciferol (VITAMIN D-3) 1000 units CAPS Take 1 capsule by mouth daily.    feeding supplement, ENSURE ENLIVE, (ENSURE ENLIVE) LIQD Take 237 mLs by mouth 2 (two) times daily between meals. Qty: 90 Bottle, Refills: 0    furosemide (LASIX) 40 MG tablet Take 1 tablet (40 mg total) by mouth daily as needed for fluid or edema (or more than 2-lbs of weight gain). Qty: 225 tablet, Refills: 2   Associated Diagnoses: ASCVD (arteriosclerotic cardiovascular disease); Acute diastolic congestive heart failure (HCC)    Melatonin 10 MG CAPS Take 10 mg by mouth at bedtime.    OVER THE COUNTER MEDICATION OTC Sleepinal - patient takes 1 at bedtime.    pantoprazole (PROTONIX) 40 MG tablet Take 1 tablet (40 mg total) by mouth daily before breakfast. Qty: 30 tablet, Refills: 5    pravastatin (PRAVACHOL) 40 MG tablet Take 40 mg by mouth daily.    Probiotic Product (PROBIOTIC DAILY PO) Take 1 tablet daily by mouth.     prochlorperazine (COMPAZINE) 10 MG tablet Take 10 mg every 6 (six) hours as needed by mouth for nausea or vomiting.     Psyllium (EQ DAILY FIBER PO) Take 1 capsule every morning by mouth. Per Patient's daughter states that he takes 1 a every morning.     sodium bicarbonate 650 MG tablet Take 650 mg by mouth 3 (three) times daily.     vitamin B-12  (CYANOCOBALAMIN) 1000 MCG tablet Take 1 tablet (1,000 mcg total) by mouth daily. Take 2 tabs by mouth daily. Qty: 60 tablet, Refills: 5   Associated Diagnoses: B12 deficiency      STOP taking these medications     loperamide (IMODIUM) 2 MG capsule         Procedures/Studies: Ct Abdomen Pelvis Wo Contrast  Result Date: 07/28/2017 CLINICAL DATA:  Diarrhea for months. Sudden severe nausea and vomiting all day today. Abdominal pain. EXAM: CT ABDOMEN AND PELVIS WITHOUT CONTRAST TECHNIQUE: Multidetector CT imaging of the abdomen and pelvis was performed following the standard protocol without IV contrast. COMPARISON:  11/18/2006 FINDINGS: Lower chest: 4 mm subpleural nodule in the right lung base. 4 mm subpleural nodule in the left lung base. These nodules were not seen previously. Several additional smaller nodules are demonstrated. Hepatobiliary: No focal liver lesions are identified. The gallbladder is distended but there is no wall thickening or inflammatory change. No radiopaque stones are demonstrated. Pancreas: Unremarkable. No pancreatic ductal dilatation or surrounding inflammatory changes. Spleen: Normal in size without focal abnormality. Adrenals/Urinary Tract: No adrenal gland nodules. Bilateral intrarenal stones. Largest is in the left lower pole measuring 12 mm in diameter. No hydronephrosis or hydroureter. No ureteral stones are demonstrated. Bladder wall is not thickened. No bladder filling defects. Stomach/Bowel: The stomach is markedly distended, mostly with fluid. No gastric wall thickening is identified. The proximal duodenum is distended. Transition zone appears to be at the second portion of the duodenum as it crosses beneath the superior mesenteric artery. Remainder of the small bowel is decompressed. Scattered stool in the colon. No colonic distention or wall thickening. Appendix is not identified. Vascular/Lymphatic: Calcification of the abdominal aorta.  No aneurysm. There is  retroperitoneal and periaortic lymphadenopathy with lymph nodes measuring up to about 15 mm diameter. Some of the lymph nodes are calcified. Prominent celiac axis and mesenteric lymphadenopathy. There is a partially calcified mass centrally in the mesentery and surrounding the superior mesenteric artery. The mass measures about 3.3 x 5.5 cm in diameter. Desmoplastic response with radiating scars into the mesentery. Reproductive: Prostate gland is not enlarged. Prostate calcifications are present. Other: No free air in the abdomen. Small amount of free fluid around the liver edge. Abdominal wall musculature appears intact. Musculoskeletal: Degenerative changes in the spine. No destructive bone lesions. IMPRESSION: 1. Large partially calcified mass in the root of the mesentery with radiating scarring. Additional calcified and noncalcified lymphadenopathy in the retroperitoneum and celiac axis. Differential diagnosis would include carcinoid tumor, sclerosing mesenteritis, or possibly lymphoma. 2. Marked distention of the stomach and proximal duodenum with transition zone at the second- third portion of the duodenum. Obstruction is likely caused by the mesenteric process described above. 3. Distended gallbladder without evidence of cholelithiasis. 4. Multiple bilateral nonobstructing intrarenal stones. 5. Small amount of free fluid around the liver edge. 6. Aortic calcifications. 7. Small scattered nodules in the lung bases, largest measuring 4 mm. No follow-up needed if patient is low-risk (and has no known or suspected primary neoplasm). Non-contrast chest CT can be considered in 12 months if patient is high-risk. This recommendation follows the consensus statement: Guidelines for Management of Incidental Pulmonary Nodules Detected on CT Images: From the Fleischner Society 2017; Radiology 2017; 284:228-243. Electronically Signed   By: Lucienne Capers M.D.   On: 07/28/2017 21:27   Dg Abd 1 View  Result Date:  08/07/2017 CLINICAL DATA:  History of bypass gastrojejunostomy. Evaluate for a air or contrast moving into small bowel EXAM: ABDOMEN - 1 VIEW COMPARISON:  Upper GI 08/06/2017 FINDINGS: Unable to assess for patency of the gastrojejunostomy. NG tube tip is present in the mid stomach. Nonobstructive bowel gas pattern noted. No free air organomegaly. Left lower pole nephrolithiasis noted. IMPRESSION: Nonobstructive bowel gas pattern. NG tube tip remains in the mid stomach. Cannot determine patency of gastrojejunostomy on this plain film study. Electronically Signed   By: Rolm Baptise M.D.   On: 08/07/2017 07:56   Dg Abd 1 View  Result Date: 08/02/2017 CLINICAL DATA:  76 year old male status post NG tube placement. Partially calcified mesenteric mass. EXAM: ABDOMEN - 1 VIEW COMPARISON:  CT Abdomen and Pelvis 07/28/2017 FINDINGS: Portable AP supine view at 0915 hours. Enteric tube courses to the abdomen and side hole is at the level of the proximal gastric body (arrow). Paucity bowel gas otherwise. Coarse calcifications in the left abdomen correspond to nephrolithiasis. Coarse calcifications in the right upper quadrant correspond a right upper pole nephrolithiasis. Negative lung bases. Endplate degeneration in the spine. No acute osseous abnormality identified. IMPRESSION: 1. NG tube side hole at the level of the proximal gastric body. 2. Nephrolithiasis.  Paucity of bowel gas. Electronically Signed   By: Genevie Ann M.D.   On: 08/02/2017 09:38   US Renal  Result Date: 08/05/2017 CLINICAL DATA:  Acute renal disease superimposed on chronic renal disease. EXAM: RENAL / URINARY TRACT ULTRASOUND COMPLETE COMPARISON:  Abdominal series 11 08/2017. CT 07/28/2017. Ultrasound 03/02/2017 . FINDINGS: Right Kidney: Length: 10.5 cm. Echogenicity within normal limits. Cortical irregularity suggesting scarring . No mass or hydronephrosis visualized. Left Kidney: Length: 11.4 cm. Echogenicity within normal limits. Cortical  irregularity suggesting scarring . No mass or hydronephrosis visualized.  13 mm nonobstructing stone. Bladder: A Foley catheter noted.  Bladder is nondistended. Incidental note is made of increased hepatic echogenicity consistent fatty infiltration and/or hepatocellular disease. Mild ascites incidentally noted. IMPRESSION: 1.  Bilateral renal cortical irregularity suggesting scarring. 2. 13 mm nonobstructing left renal stone. No acute renal abnormality identified. No hydronephrosis. 3. Incidental note is made of increased hepatic echogenicity consistent fatty infiltration and/or hepatocellular disease. Mild ascites is incidentally noted . Electronically Signed   By: Marcello Moores  Register   On: 08/05/2017 12:58   Dg Chest Portable 1 View  Result Date: 07/28/2017 CLINICAL DATA:  NG tube placement EXAM: PORTABLE CHEST 1 VIEW COMPARISON:  07/09/2015 FINDINGS: Minimal bibasilar atelectasis. Cardiomediastinal silhouette within normal limits. Aortic atherosclerosis. No pneumothorax. Esophageal tube courses toward the diaphragm but the tip is poorly visible, possibly over the GE junction. Gaseous enlargement of the stomach IMPRESSION: 1. Minimal bibasilar atelectasis 2. Poor visibility of the tip of the esophageal tube, it possibly projects over the distal esophagus. The stomach appears enlarged. Electronically Signed   By: Donavan Foil M.D.   On: 07/28/2017 23:20   Dg Knee Right Port  Result Date: 07/31/2017 CLINICAL DATA:  Pain and swelling for several days EXAM: PORTABLE RIGHT KNEE - 1-2 VIEW COMPARISON:  None. FINDINGS: no acute displaced fracture or malalignment is seen. Mild patellofemoral degenerative changes. Joint space calcifications. Prepatellar calcifications and oval soft tissue thickening suprapatellar superficial soft tissues. Vascular calcification IMPRESSION: 1. No acute fracture or malalignment 2. Mild degenerative changes.  Chondrocalcinosis 3. Soft tissue thickening and calcifications anterior to the  patella suggesting prepatellar bursitis. Electronically Signed   By: Donavan Foil M.D.   On: 07/31/2017 17:59   Dg Abd 2 Views  Result Date: 08/05/2017 CLINICAL DATA:  Abdomen pain with emesis and diarrhea, status post laparotomy 08/02/2017 EXAM: ABDOMEN - 2 VIEW COMPARISON:  07/28/2017, 08/02/2017 FINDINGS: Supine and upright views of the abdomen demonstrate small to moderate free air beneath the right greater than left diaphragm. Esophageal tube tip overlies the proximal stomach. Midline cutaneous staples. Mild gaseous enlargement of small bowel up to 3.8 cm in the right mid abdomen. Paucity of distal gas. Scattered fluid levels. Calcifications overlying the left kidney. IMPRESSION: 1. Small moderate pneumoperitoneum, probably related to recent laparotomy ; radiographic follow-up may be performed to insure that this is decreasing as opposed increasing given the quantity under the right hemidiaphragm. 2. Mild gaseous enlargement of right lower quadrant small bowel with fluid levels suspicious for a mild ileus. 3. Left kidney stones Electronically Signed   By: Donavan Foil M.D.   On: 08/05/2017 15:08   Dg Ugi  W/kub  Result Date: 08/06/2017 CLINICAL DATA:  Recent bypass gastric jejunostomy. 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 Evaluate for leak EXAM: WATER SOLUBLE UPPER GI SERIES TECHNIQUE: Single-column upper GI series was performed using water soluble contrast. CONTRAST:  350 mL p.O. <See Chart> ISOVUE-300 IOPAMIDOL (ISOVUE-300) INJECTION 61% COMPARISON:  CT 07/28/2017 FLUOROSCOPY TIME:  Fluoroscopy Time:  3 minutes 18 seconds Radiation Exposure Index (if provided by the fluoroscopic device): Number of Acquired Spot Images: 0 FINDINGS: Isovue contrast was injected through the NG tube to fill the stomach. Stomach is normal in size and contour. Stomach empties into the duodenum. The duodenum appears obstructed in the third portion as noted on prior CT due to tumor in the mesenteric. No filling was  seen in the gastrojejunostomy. Patient was positioned right lateral, left lateral, and supine, and with delayed imaging there was no filling of the gastrojejunostomy.  On the final AP image, there appears to be some edema along the greater curve of the stomach which may be the anastomosis however no significant contrast is noted in the gastric jejunostomy. This could be positional or due to edema from recent surgery. Negative for extravasation of contrast.  No leak. IMPRESSION: The stomach was filled with contrast. There is obstruction of the third portion the duodenum due to tumor. No filling of the gastric jejunostomy. This could be due to edema at the anastomosis. Negative for leak. Electronically Signed   By: Franchot Gallo M.D.   On: 08/06/2017 10:48      Subjective: Pt feeling much better, ambulating well. Wants to go home.  He is tolerating soft diet.  Right knee pain is better.    Discharge Exam: Vitals:   08/09/17 2046 08/10/17 0535  BP: 120/76 130/68  Pulse: 80 99  Resp: 20 20  Temp: (!) 97.5 F (36.4 C) (!) 97.3 F (36.3 C)  SpO2: 97% 98%   Vitals:   08/09/17 1500 08/09/17 2005 08/09/17 2046 08/10/17 0535  BP: 97/61  120/76 130/68  Pulse: 92  80 99  Resp: 19  20 20   Temp: 98.3 F (36.8 C)  (!) 97.5 F (36.4 C) (!) 97.3 F (36.3 C)  TempSrc: Oral  Oral Oral  SpO2: 98% 96% 97% 98%  Weight:    86.5 kg (190 lb 11.2 oz)  Height:       General exam: Alert, awake, oriented x 3. Sitting up in bed.  Respiratory system: Clear to auscultation. Respiratory effort normal. Cardiovascular system: normal s1,s2 sounds.  No murmurs, rubs, gallops. Gastrointestinal system: Abdomen is nondistended, slightly tender to palpation no organomegaly or masses felt.  Active BS.  Central nervous system: Alert and oriented. No focal neurological deficits. Extremities: No C/C/E, +pedal pulses, crepitus right knee, mild prepatellar effusion Skin: No rashes, lesions or ulcers Psychiatry: Judgement  and insight appear normal. Mood & affect appropriate.    The results of significant diagnostics from this hospitalization (including imaging, microbiology, ancillary and laboratory) are listed below for reference.     Microbiology: No results found for this or any previous visit (from the past 240 hour(s)).   Labs: BNP (last 3 results) No results for input(s): BNP in the last 8760 hours. Basic Metabolic Panel: Recent Labs  Lab 08/06/17 0446 08/07/17 0701 08/08/17 0602 08/09/17 0512 08/10/17 0543  NA 140 143 145 141  142 137  K 3.3* 3.8 3.1* 4.2  4.0 4.2  CL 100* 107 111 107  108 106  CO2 28 24 29 27  27 23   GLUCOSE 111* 100* 140* 127*  129* 141*  BUN 82* 76* 63* 57*  59* 63*  CREATININE 3.03* 2.37* 2.12* 2.22*  2.22* 2.35*  CALCIUM 8.1* 8.4* 8.2* 8.4*  8.4* 8.5*  MG  --   --  1.5* 1.9  --   PHOS 3.7 2.9 2.2* 2.1* 2.4*   Liver Function Tests: Recent Labs  Lab 08/06/17 0446 08/07/17 0701 08/08/17 0602 08/09/17 0512 08/10/17 0543  ALBUMIN 2.6* 2.8* 2.6* 2.8* 2.8*   No results for input(s): LIPASE, AMYLASE in the last 168 hours. No results for input(s): AMMONIA in the last 168 hours. CBC: Recent Labs  Lab 08/04/17 0427 08/06/17 0446 08/08/17 0602 08/09/17 0512  WBC 8.3 6.4 5.3 6.8  HGB 9.7* 10.1* 9.0* 9.7*  HCT 32.2* 33.7* 30.4* 33.5*  MCV 98.8 99.4 100.3* 100.9*  PLT 157 149* 150 175   Cardiac Enzymes: No  results for input(s): CKTOTAL, CKMB, CKMBINDEX, TROPONINI in the last 168 hours. BNP: Invalid input(s): POCBNP CBG: No results for input(s): GLUCAP in the last 168 hours. D-Dimer No results for input(s): DDIMER in the last 72 hours. Hgb A1c No results for input(s): HGBA1C in the last 72 hours. Lipid Profile No results for input(s): CHOL, HDL, LDLCALC, TRIG, CHOLHDL, LDLDIRECT in the last 72 hours. Thyroid function studies No results for input(s): TSH, T4TOTAL, T3FREE, THYROIDAB in the last 72 hours.  Invalid input(s): FREET3 Anemia work  up No results for input(s): VITAMINB12, FOLATE, FERRITIN, TIBC, IRON, RETICCTPCT in the last 72 hours. Urinalysis    Component Value Date/Time   COLORURINE YELLOW 07/28/2017 1511   APPEARANCEUR HAZY (A) 07/28/2017 1511   LABSPEC 1.017 07/28/2017 1511   PHURINE 5.0 07/28/2017 1511   GLUCOSEU NEGATIVE 07/28/2017 1511   HGBUR MODERATE (A) 07/28/2017 1511   BILIRUBINUR NEGATIVE 07/28/2017 1511   KETONESUR NEGATIVE 07/28/2017 1511   PROTEINUR NEGATIVE 07/28/2017 1511   UROBILINOGEN 0.2 07/05/2008 0922   NITRITE NEGATIVE 07/28/2017 1511   LEUKOCYTESUR SMALL (A) 07/28/2017 1511   Sepsis Labs Invalid input(s): PROCALCITONIN,  WBC,  LACTICIDVEN Microbiology No results found for this or any previous visit (from the past 240 hour(s)).  Time coordinating discharge: 38 mins  SIGNED:  Irwin Brakeman, MD  [facility] 08/10/2017, 10:57 AM Pager [phone]  If 7PM-7AM, please contact night-coverage [email or web address] Password [email or web address]

## 2017-08-10 NOTE — Discharge Instructions (Signed)
Follow with Primary MD  Fagan, Roy, MD  and other consultant's as instructed your Hospitalist MD ° °Please get a complete blood count and chemistry panel checked by your Primary MD at your next visit, and again as instructed by your Primary MD. ° °Get Medicines reviewed and adjusted: °Please take all your medications with you for your next visit with your Primary MD ° °Laboratory/radiological data: °Please request your Primary MD to go over all hospital tests and procedure/radiological results at the follow up, please ask your Primary MD to get all Hospital records sent to his/her office. ° °In some cases, they will be blood work, cultures and biopsy results pending at the time of your discharge. Please request that your primary care M.D. follows up on these results. ° °Also Note the following: °If you experience worsening of your admission symptoms, develop shortness of breath, life threatening emergency, suicidal or homicidal thoughts you must seek medical attention immediately by calling 911 or calling your MD immediately  if symptoms less severe. ° °You must read complete instructions/literature along with all the possible adverse reactions/side effects for all the Medicines you take and that have been prescribed to you. Take any new Medicines after you have completely understood and accpet all the possible adverse reactions/side effects.  ° °Do not drive when taking Pain medications or sleeping medications (Benzodaizepines) ° °Do not take more than prescribed Pain, Sleep and Anxiety Medications. It is not advisable to combine anxiety,sleep and pain medications without talking with your primary care practitioner ° °Special Instructions: If you have smoked or chewed Tobacco  in the last 2 yrs please stop smoking, stop any regular Alcohol  and or any Recreational drug use. ° °Wear Seat belts while driving. ° °Please note: °You were cared for by a hospitalist during your hospital stay. Once you are discharged, your  primary care physician will handle any further medical issues. Please note that NO REFILLS for any discharge medications will be authorized once you are discharged, as it is imperative that you return to your primary care physician (or establish a relationship with a primary care physician if you do not have one) for your post hospital discharge needs so that they can reassess your need for medications and monitor your lab values. ° ° ° ° °

## 2017-08-10 NOTE — Progress Notes (Signed)
IV discontinued,catheter intact. Discharge instructions given on medications,and follow up visits,patient and family verbalized understanding. Prescriptions sent to Pharmacy of choice documented on AVS. Staff to accompany patient to an awaiting vehicle.

## 2017-08-10 NOTE — Care Management Note (Signed)
Case Management Note  Patient Details  Name: Stephen [name] MRN: [id number] Date of Birth: 28-Jul-1941   If discussed at [facility] Length of Stay Meetings, dates discussed:  08/10/2017  Additional Comments:  Candace Begue, Chauncey Reading, RN 08/10/2017, 12:09 PM

## 2017-08-10 NOTE — Progress Notes (Signed)
[facility] Progress Note  8 Days Post-Op  Subjective: Tolerating diet and having BMs.  Objective: Vital signs in last 24 hours: Temp:  [97.3 F (36.3 C)-98.3 F (36.8 C)] 97.3 F (36.3 C) (11/20 0535) Pulse Rate:  [80-99] 99 (11/20 0535) Resp:  [19-20] 20 (11/20 0535) BP: (97-130)/(61-76) 130/68 (11/20 0535) SpO2:  [96 %-98 %] 98 % (11/20 0535) Weight:  [190 lb 11.2 oz (86.5 kg)] 190 lb 11.2 oz (86.5 kg) (11/20 0535) Last BM Date: 08/09/17  Intake/Output from previous day: 11/19 0701 - 11/20 0700 In: 1200 [P.O.:1200] Out: 1060 [Urine:1060] Intake/Output this shift: No intake/output data recorded.  General appearance: alert, cooperative and no distress Resp: normal work breathing GI: soft, non-tender; bowel sounds normal; no masses,  no organomegaly incision c/d/i with staples, no erythema or drainage  Lab Results:  Recent Labs    08/08/17 0602 08/09/17 0512  WBC 5.3 6.8  HGB 9.0* 9.7*  HCT 30.4* 33.5*  PLT 150 175   BMET Recent Labs    08/09/17 0512 08/10/17 0543  NA 141  142 137  K 4.2  4.0 4.2  CL 107  108 106  CO2 27  27 23   GLUCOSE 127*  129* 141*  BUN 57*  59* 63*  CREATININE 2.22*  2.22* 2.35*  CALCIUM 8.4*  8.4* 8.5*    Assessment/Plan: Mr. 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 -Doing well -Will see in clinic in 2 weeks  -Can d/c home from surgery standpoint    LOS: 13 days    Virl Cagey 08/10/2017

## 2017-08-10 NOTE — Progress Notes (Signed)
Subjective: Interval History: Patient offers no complaints.  He is tolerating his feeding.  Denies any nausea or vomiting..    Objective: Vital signs in last 24 hours: Temp:  [97.3 F (36.3 C)-98.3 F (36.8 C)] 97.3 F (36.3 C) (11/20 0535) Pulse Rate:  [80-99] 99 (11/20 0535) Resp:  [19-20] 20 (11/20 0535) BP: (97-130)/(61-76) 130/68 (11/20 0535) SpO2:  [96 %-98 %] 98 % (11/20 0535) Weight:  [86.5 kg (190 lb 11.2 oz)] 86.5 kg (190 lb 11.2 oz) (11/20 0535) Weight change:   Intake/Output from previous day: 11/19 0701 - 11/20 0700 In: 1200 [P.O.:1200] Out: 1060 [Urine:1060] Intake/Output this shift: No intake/output data recorded.  General appearance: alert, cooperative and no distress Resp: clear to auscultation bilaterally Cardio: regular rate and rhythm GI: abnormal findings:  Positive bowel sound Extremities: No edema  Lab Results: Recent Labs    08/08/17 0602 08/09/17 0512  WBC 5.3 6.8  HGB 9.0* 9.7*  HCT 30.4* 33.5*  PLT 150 175   BMET:  Recent Labs    08/09/17 0512 08/10/17 0543  NA 141  142 137  K 4.2  4.0 4.2  CL 107  108 106  CO2 27  27 23   GLUCOSE 127*  129* 141*  BUN 57*  59* 63*  CREATININE 2.22*  2.22* 2.35*  CALCIUM 8.4*  8.4* 8.5*   No results for input(s): PTH in the last 72 hours. Iron Studies: No results for input(s): IRON, TIBC, TRANSFERRIN, FERRITIN in the last 72 hours.  Studies/Results: No results found.  I have reviewed the patient's current medications.  Assessment/Plan: Problem #1 acute kidney injury superimposed on chronic.  Patient is nonoliguric and had about a liter of urine output.  His renal function has returned to his baseline.  At this moment he does not have any uremic signs and symptoms. Problem #2 chronic renal failure: Stage IV.  Thought to be secondary to hypertension/diabetes/recurrent acute kidney injury/age-related renal function loss.  Ultrasound of the kidneys showed right kidney to be 10.5 left kidney  11.4 and  no hydronephrosis. 3] hypokalemia: Patient is on potassium supplement and his potassium is normal. 4] anemia: His hemoglobin is stable: His hemoglobin is below target goal.  His hemoglobin has improved. 5] bone and mineral disorder: His calcium and phosphorus is range 6] history of coronary artery disease: No chest pain. 7] history of mesenteric mass status post surgery.  Presently able to tolerate clear liquid diet.  And is passing gas. Plan: 1] continue his present management 2] if patient is going to discharge we will see him in 4 weeks.   LOS: 13 days  Athziry Millican S 08/10/2017,7:57 AM

## 2017-08-10 NOTE — Plan of Care (Signed)
Uneventful night; will continue to monitor.

## 2017-08-16 ENCOUNTER — Encounter (HOSPITAL_COMMUNITY): Payer: PPO | Attending: Oncology | Admitting: Oncology

## 2017-08-16 ENCOUNTER — Encounter (HOSPITAL_COMMUNITY): Payer: PPO

## 2017-08-16 ENCOUNTER — Encounter (HOSPITAL_COMMUNITY): Payer: PPO | Attending: Oncology

## 2017-08-16 ENCOUNTER — Encounter (HOSPITAL_COMMUNITY): Payer: Self-pay

## 2017-08-16 ENCOUNTER — Other Ambulatory Visit (HOSPITAL_COMMUNITY): Payer: PPO

## 2017-08-16 ENCOUNTER — Other Ambulatory Visit: Payer: Self-pay

## 2017-08-16 VITALS — BP 102/42 | HR 87 | Temp 97.7°F | Resp 18

## 2017-08-16 DIAGNOSIS — D631 Anemia in chronic kidney disease: Secondary | ICD-10-CM | POA: Insufficient documentation

## 2017-08-16 DIAGNOSIS — N183 Chronic kidney disease, stage 3 unspecified: Secondary | ICD-10-CM

## 2017-08-16 DIAGNOSIS — M25561 Pain in right knee: Secondary | ICD-10-CM

## 2017-08-16 DIAGNOSIS — E538 Deficiency of other specified B group vitamins: Secondary | ICD-10-CM

## 2017-08-16 DIAGNOSIS — C7B02 Secondary carcinoid tumors of liver: Secondary | ICD-10-CM

## 2017-08-16 DIAGNOSIS — C7A019 Malignant carcinoid tumor of the small intestine, unspecified portion: Secondary | ICD-10-CM

## 2017-08-16 DIAGNOSIS — N189 Chronic kidney disease, unspecified: Secondary | ICD-10-CM

## 2017-08-16 DIAGNOSIS — R197 Diarrhea, unspecified: Secondary | ICD-10-CM

## 2017-08-16 DIAGNOSIS — D509 Iron deficiency anemia, unspecified: Secondary | ICD-10-CM | POA: Insufficient documentation

## 2017-08-16 DIAGNOSIS — C7A010 Malignant carcinoid tumor of the duodenum: Secondary | ICD-10-CM

## 2017-08-16 DIAGNOSIS — D508 Other iron deficiency anemias: Secondary | ICD-10-CM

## 2017-08-16 LAB — CBC WITH DIFFERENTIAL/PLATELET
Basophils Absolute: 0 10*3/uL (ref 0.0–0.1)
Basophils Relative: 0 %
EOS ABS: 0.1 10*3/uL (ref 0.0–0.7)
Eosinophils Relative: 1 %
HEMATOCRIT: 30.5 % — AB (ref 39.0–52.0)
HEMOGLOBIN: 9.3 g/dL — AB (ref 13.0–17.0)
LYMPHS ABS: 2.1 10*3/uL (ref 0.7–4.0)
LYMPHS PCT: 22 %
MCH: 29.8 pg (ref 26.0–34.0)
MCHC: 30.5 g/dL (ref 30.0–36.0)
MCV: 97.8 fL (ref 78.0–100.0)
Monocytes Absolute: 0.8 10*3/uL (ref 0.1–1.0)
Monocytes Relative: 9 %
NEUTROS ABS: 6.3 10*3/uL (ref 1.7–7.7)
NEUTROS PCT: 68 %
Platelets: 263 10*3/uL (ref 150–400)
RBC: 3.12 MIL/uL — AB (ref 4.22–5.81)
RDW: 18.2 % — ABNORMAL HIGH (ref 11.5–15.5)
WBC: 9.3 10*3/uL (ref 4.0–10.5)

## 2017-08-16 MED ORDER — OXYCODONE-ACETAMINOPHEN 5-325 MG PO TABS: 1.0000 | ORAL_TABLET | Freq: Four times a day (QID) | ORAL | 0 refills | Status: DC | PRN

## 2017-08-16 MED ORDER — DARBEPOETIN ALFA 300 MCG/0.6ML IJ SOSY
300.0000 ug | PREFILLED_SYRINGE | Freq: Once | INTRAMUSCULAR | Status: AC
  Administered 2017-08-16: 300 ug via SUBCUTANEOUS
  Filled 2017-08-16: qty 0.6

## 2017-08-16 NOTE — Progress Notes (Signed)
Stephen [name] tolerated Aranesp injection well without complaints or incident. Labs reviewed with Dr. Talbert Cage prior to administering this injection. Hgb 9.3 VSS Pt discharged via wheelchair in satisfactory condition accompanied by his family

## 2017-08-16 NOTE — Progress Notes (Signed)
[facility] Follow up:    Stephen Noble, MD [address] [place] [address]   DIAGNOSIS:  1. Anemia in the setting of chronic renal disease 2. B12 deficiency 3. 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  INTERVAL HISTORY: Stephen [name] 76 y.o. male returns for follow up of anemia in the setting of CKD, B12 deficiency, and newly diagnosed mesentery carcinoid tumor.   Patient was hospitalized from 07/28/17 through 08/10/17 for nausea vomiting.  He has had several weeks of diarrhea.  He had a CT abdomen pelvis without contrast on 07/28/2017 which demonstrated a markedly distended stomach and proximal duodenum, he also had a large 3.5 x 5.5 cm partially calcified mass in the root of the mesentery with radiating scarring, and additional calcified and noncalcified lymphadenopathy in the retroperitoneum and celiac axis.  Surgery was consulted for gastric outlet obstruction and patient underwent ex lap, palliative gastrojejunostomy, liver biopsies of nodules, small bowel resection of ischemic bowel from the mesenteric mass on 08/02/17. No liver lesions were noted on the CT scan, however during the ex-lap liver nodularity was noted and a biopsy was performed.  Surgical path demonstrated resected small bowel demonstrated small bowel mucosa with ischemic changes, margins of resection are viable, no evidence of malignancy.  Liver biopsy demonstrated metastatic low grade neuroendocrine tumor.  Today patient states that he continues to have diarrhea.  All the diarrhea is aggravating his hemorrhoids. Also for the past 2-3 days he has had leakage of clear fluid from his surgical incision site.  Continues to feel weak and states that his appetite is poor however he is forced himself to eat.  He denies any chest pain or shortness of breath.  While he was in the hospital he was also treated for bursitis of  the right knee and was placed on prednisone.  He has completed prednisone at this time, however his knee pain has returned.  He states that his knee pain keeps him up at nighttime.    Patient Active Problem List   Diagnosis Date Noted  . Gastric outlet obstruction   . Neoplasm of uncertain behavior of mesentery   . Preoperative cardiovascular examination   . Intractable nausea and vomiting 07/28/2017  . Bowel obstruction ([place]) 07/28/2017  . Acute on chronic renal failure ([place]) 07/28/2017  . Protein-calorie malnutrition, severe 07/06/2017  . Enteropathogenic Escherichia coli infection 07/06/2017  . Abnormal weight loss 07/06/2017  . Acute renal failure superimposed on stage 4 chronic kidney disease ([place]) 07/05/2017  . Permanent atrial fibrillation ([place]) 07/05/2017  . Diarrhea 07/05/2017  . Iron deficiency anemia 12/31/2016  . PAF (paroxysmal atrial fibrillation) ([place]) 12/14/2016  . Stroke (cerebrum) ([place]) 09/04/2016  . B12 deficiency 08/17/2016  . PAD (peripheral artery disease) ([place]) 01/21/2016  . Irregular heartbeat 07/19/2014  . Atherosclerotic PVD with intermittent claudication ([place]) 07/17/2014  . Carotid stenosis 07/12/2014  . Chronic diastolic heart failure ([place]) 09/27/2013  . Occlusion and stenosis of carotid artery without mention of cerebral infarction 06/28/2012  . Anemia of chronic renal failure, stage 3 (moderate) ([place]) 06/01/2012  . CKD (chronic kidney disease) stage 3, GFR 30-59 ml/min (HCC) 06/01/2012  . Diabetes mellitus type II   . Essential hypertension   . Hyperlipidemia   . Cerebrovascular disease   . Coronary artery disease involving native coronary artery of native heart with angina pectoris ([place])   . Peripheral vascular disease ([place])  has No Known Allergies.  MEDICAL HISTORY: Past Medical History:  Diagnosis Date  . Anemia of chronic renal failure, stage 3 (moderate) (HCC)   . Atrial fibrillation ([place])    Documented 08/2016  . B12 deficiency  08/17/2016  . Carotid artery occlusion    Right carotid endarterectomy 2001; left carotid endarterectomy in 2008  . Celiac disease   . Chronic renal disease, stage 3, moderately decreased glomerular filtration rate between 30-59 mL/min/1.73 square meter ([place]) 06/01/2012  . Coronary atherosclerosis of native coronary artery    PTCA of LAD in 1994; total obstruction of the RCA treated medically; subsequent stress nuclear study with inferior ischemia  . DDD (degenerative disc disease), lumbar    Lumbar surgery in 1984  . Diabetes mellitus type II   . Essential hypertension   . History of stroke    Right brain 2001; right PCA, MCA/PCA, left SCA infarcts 08/2017 with atrial fibrillation  . Hyperlipidemia   . Iron deficiency anemia 12/31/2016  . Peripheral vascular disease ([place])     SURGICAL HISTORY: Past Surgical History:  Procedure Laterality Date  . APPENDECTOMY  1970  . BOWEL RESECTION  08/02/2017   Procedure: SMALL BOWEL RESECTION;  Surgeon: Virl Cagey, MD;  Location: [facility];  Service: General;;  . CAROTID ENDARTERECTOMY  2008   Left   . CAROTID ENDARTERECTOMY  06/15/2000   Right  . COLONOSCOPY N/A 10/10/2015   Procedure: COLONOSCOPY;  Surgeon: Rogene Houston, MD;  Location: [facility]ITE;  Service: Endoscopy;  Laterality: N/A;  12:25 - moved to 11:15 - Ann to notify  . GASTROJEJUNOSTOMY  08/02/2017   Procedure: GASTROJEJUNOSTOMY;  Surgeon: Virl Cagey, MD;  Location: [facility];  Service: General;;  . LIVER BIOPSY  08/02/2017   Procedure: LIVER BIOPSY;  Surgeon: Virl Cagey, MD;  Location: [facility];  Service: General;;  . LUMBAR [place] SURGERY  2000, 2001   L5 HNP required 2 surgical procedures    SOCIAL HISTORY: Social History   Socioeconomic History  . Marital status: Widowed    Spouse name: Not on file  . Number of children: Not on file  . Years of education: Not on file  . Highest education level: Not on file  Social Needs  . Financial resource strain:  Not on file  . Food insecurity - worry: Not on file  . Food insecurity - inability: Not on file  . Transportation needs - medical: Not on file  . Transportation needs - non-medical: Not on file  Occupational History  . Not on file  Tobacco Use  . Smoking status: Former Smoker    Packs/day: 3.00    Years: 10.00    Pack years: 30.00    Types: Cigarettes    Last attempt to quit: 09/21/1972    Years since quitting: 44.9  . Smokeless tobacco: Never Used  Substance and Sexual Activity  . Alcohol use: No    Alcohol/week: 0.0 oz  . Drug use: No  . Sexual activity: Yes    Partners: Male    Birth control/protection: Post-menopausal  Other Topics Concern  . Not on file  Social History Narrative  . Not on file    FAMILY HISTORY: Family History  Problem Relation Age of Onset  . Heart disease Mother        Before age 46  . Diabetes Mother   . Heart attack Mother   . Diabetes Sister   . Heart disease Brother   . Diabetes Brother   .  Diabetes Daughter   . Diabetes Son   . Hyperlipidemia Son     Review of Systems - Oncology  ROS as per HPI otherwise 12 point ROS is negative.  PHYSICAL EXAMINATION  ECOG PERFORMANCE STATUS: 1 - Symptomatic but completely ambulatory  Vitals:   08/16/17 0855  BP: (!) 102/42  Pulse: 87  Resp: 18  Temp: 97.7 F (36.5 C)  SpO2: 100%    Physical Exam Constitutional: Well-developed, well-nourished, and in no distress.   HENT:  Head: Normocephalic and atraumatic.  Mouth/Throat: No oropharyngeal exudate. Mucosa moist. Eyes: Pupils are equal, round, and reactive to light. Conjunctivae are normal. No scleral icterus.  Neck: Normal range of motion. Neck supple. No JVD present.  Cardiovascular: Normal rate, regular rhythm and normal heart sounds.  Exam reveals no gallop and no friction rub.   No murmur heard. Pulmonary/Chest: Effort normal and breath sounds normal. No respiratory distress. No wheezes.No rales.  Abdominal: Soft. Bowel sounds are  normal. No distension. Midline surgical incision with staples in place, clear discharge from incision site, no erythema. Musculoskeletal: No edema or tenderness.  Lymphadenopathy:    No cervical or supraclavicular adenopathy.  Neurological: Alert and oriented to person, place, and time. No cranial nerve deficit.  Skin: Skin is warm and dry. No rash noted. No erythema. No pallor.  Psychiatric: Affect and judgment normal.    LABORATORY DATA:  CBC    Component Value Date/Time   WBC 9.3 08/16/2017 0824   RBC 3.12 (L) 08/16/2017 0824   HGB 9.3 (L) 08/16/2017 0824   HCT 30.5 (L) 08/16/2017 0824   PLT 263 08/16/2017 0824   MCV 97.8 08/16/2017 0824   MCH 29.8 08/16/2017 0824   MCHC 30.5 08/16/2017 0824   RDW 18.2 (H) 08/16/2017 0824   LYMPHSABS 2.1 08/16/2017 0824   MONOABS 0.8 08/16/2017 0824   EOSABS 0.1 08/16/2017 0824   BASOSABS 0.0 08/16/2017 0824    CMP     Component Value Date/Time   NA 137 08/10/2017 0543   K 4.2 08/10/2017 0543   CL 106 08/10/2017 0543   CO2 23 08/10/2017 0543   GLUCOSE 141 (H) 08/10/2017 0543   BUN 63 (H) 08/10/2017 0543   CREATININE 2.35 (H) 08/10/2017 0543   CALCIUM 8.5 (L) 08/10/2017 0543   PROT 8.2 (H) 07/28/2017 1658   ALBUMIN 2.8 (L) 08/10/2017 0543   AST 18 07/28/2017 1658   ALT 12 (L) 07/28/2017 1658   ALKPHOS 88 07/28/2017 1658   BILITOT 0.5 07/28/2017 1658   GFRNONAA 25 (L) 08/10/2017 0543   GFRAA 29 (L) 08/10/2017 0543       PENDING LABS:   RADIOGRAPHIC STUDIES: CT Abdomen Pelvis Wo Contrast (Accession [id number]) (Order [id number])  Imaging  Date: 07/28/2017 Department: [facility] Released By/Authorizing: Merrily Pew, MD (auto-released)  Exam Information   Status Exam Begun  Exam Ended   Final [99] 07/28/2017 9:01 PM 07/28/2017 9:03 PM  PACS Images   Show images for CT Abdomen Pelvis Wo Contrast  Study Result   CLINICAL DATA:  Diarrhea for months. Sudden severe nausea and vomiting all day  today. Abdominal pain.  EXAM: CT ABDOMEN AND PELVIS WITHOUT CONTRAST  TECHNIQUE: Multidetector CT imaging of the abdomen and pelvis was performed following the standard protocol without IV contrast.  COMPARISON:  11/18/2006  FINDINGS: Lower chest: 4 mm subpleural nodule in the right lung base. 4 mm subpleural nodule in the left lung base. These nodules were not seen previously. Several additional smaller  nodules are demonstrated.  Hepatobiliary: No focal liver lesions are identified. The gallbladder is distended but there is no wall thickening or inflammatory change. No radiopaque stones are demonstrated.  Pancreas: Unremarkable. No pancreatic ductal dilatation or surrounding inflammatory changes.  Spleen: Normal in size without focal abnormality.  Adrenals/Urinary Tract: No adrenal gland nodules. Bilateral intrarenal stones. Largest is in the left lower pole measuring 12 mm in diameter. No hydronephrosis or hydroureter. No ureteral stones are demonstrated. Bladder wall is not thickened. No bladder filling defects.  Stomach/Bowel: The stomach is markedly distended, mostly with fluid. No gastric wall thickening is identified. The proximal duodenum is distended. Transition zone appears to be at the second portion of the duodenum as it crosses beneath the superior mesenteric artery. Remainder of the small bowel is decompressed. Scattered stool in the colon. No colonic distention or wall thickening. Appendix is not identified.  Vascular/Lymphatic: Calcification of the abdominal aorta. No aneurysm. There is retroperitoneal and periaortic lymphadenopathy with lymph nodes measuring up to about 15 mm diameter. Some of the lymph nodes are calcified. Prominent celiac axis and mesenteric lymphadenopathy. There is a partially calcified mass centrally in the mesentery and surrounding the superior mesenteric artery. The mass measures about 3.3 x 5.5 cm in diameter.  Desmoplastic response with radiating scars into the mesentery.  Reproductive: Prostate gland is not enlarged. Prostate calcifications are present.  Other: No free air in the abdomen. Small amount of free fluid around the liver edge. Abdominal wall musculature appears intact.  Musculoskeletal: Degenerative changes in the spine. No destructive bone lesions.  IMPRESSION: 1. Large partially calcified mass in the root of the mesentery with radiating scarring. Additional calcified and noncalcified lymphadenopathy in the retroperitoneum and celiac axis. Differential diagnosis would include carcinoid tumor, sclerosing mesenteritis, or possibly lymphoma. 2. Marked distention of the stomach and proximal duodenum with transition zone at the second- third portion of the duodenum. Obstruction is likely caused by the mesenteric process described above. 3. Distended gallbladder without evidence of cholelithiasis. 4. Multiple bilateral nonobstructing intrarenal stones. 5. Small amount of free fluid around the liver edge. 6. Aortic calcifications. 7. Small scattered nodules in the lung bases, largest measuring 4 mm. No follow-up needed if patient is low-risk (and has no known or suspected primary neoplasm). Non-contrast chest CT can be considered in 12 months if patient is high-risk. This recommendation follows the consensus statement: Guidelines for Management of Incidental Pulmonary Nodules Detected on CT Images: From the Fleischner Society 2017; Radiology 2017; 284:228-243.   Electronically Signed   By: Lucienne Capers M.D.   On: 07/28/2017 21:27       PATHOLOGY:  Patient: KENTRELL, HALLAHAN Collected: 08/02/2017 Client: [facility] Accession: [id number] Received: 08/03/2017 Curlene Labrum, MD DOB: 29-Aug-1941 Age: 50 Gender: M Reported: 08/06/2017 [address] Patient Ph: [phone] MRN #: [id number] [place], [place] [address] Visit #: [id number] Chart #:  Phone: [phone] Fax: CC: REPORT OF SURGICAL PATHOLOGY FINAL DIAGNOSIS Diagnosis 1. Liver, biopsy METASTATIC NEUROENDOCRINE TUMOR, LOW GRADE 2. Small intestine, resection, small bowel SMALL BOWEL MUCOSA WITH ISCHEMIC CHANGES MARGINS OF RESECTION ARE VIABLE NEGATIVE FOR MALIGNANCY Microscopic Comment 1. The neoplasm stains positive for synaptophysin, chromogranin, ki-67 labeling index <1%, supporting the above diagnosis. Casimer Lanius MD Pathologist, Electronic Signature   ASSESSMENT and THERAPY PLAN:  1. Anemia of chronic renal disease -hemoglobin 9.3 g/dL today. Proceed with aranesp q14 days.   2. B12 deficiency -Continue oral b12 replacement.  3. Low grade carcinoid mesenteric tumor with biopsy proven liver  mets. S/p ex lap, palliative gastrojejunostomy for gastric outlet obstruction, liver biopsies of nodules, small bowel resection of ischemic bowel from the mesenteric mass on 08/02/17 - Neospot PET to assess for extent of disease. - Plan to start sandostatin qmonthly on next visit to help with his symptoms of diarrhea. Need to schedule his sandostatin, will submit it for insurance approval now. - Will contact Dr. Andee Poles office regarding his new drainage from his surgical site for evaluation ASAP.   RTC after Neospot PET is done to review results and to start sandostatin.  All questions were answered. The patient knows to call the clinic with any problems, questions or concerns. We can certainly see the patient much sooner if necessary.  This note was electronically signed. Twana First, MD 08/16/2017

## 2017-08-16 NOTE — Patient Instructions (Signed)
[facility] at [facility] Discharge Instructions  RECOMMENDATIONS MADE BY THE CONSULTANT AND ANY TEST RESULTS WILL BE SENT TO YOUR REFERRING PHYSICIAN.  Received Aranesp injection today. Follow-up as scheduled. Call clinic for any questions or concerns  Thank you for choosing [facility] at [facility] to provide your oncology and hematology care.  To afford each patient quality time with our provider, please arrive at least 15 minutes before your scheduled appointment time.    If you have a lab appointment with the Cancer Center please come in thru the  Main Entrance and check in at the main information desk  You need to re-schedule your appointment should you arrive 10 or more minutes late.  We strive to give you quality time with our providers, and arriving late affects you and other patients whose appointments are after yours.  Also, if you no show three or more times for appointments you may be dismissed from the clinic at the providers discretion.     Again, thank you for choosing [facility].  Our hope is that these requests will decrease the amount of time that you wait before being seen by our physicians.       _____________________________________________________________  Should you have questions after your visit to [facility], please contact our office at [phone] between the hours of 8:30 a.m. and 4:30 p.m.  Voicemails left after 4:30 p.m. will not be returned until the following business day.  For prescription refill requests, have your pharmacy contact our office.       Resources For Cancer Patients and their Caregivers ? American Cancer Society: Can assist with transportation, wigs, general needs, runs Look Good Feel Better.        1-888-227-6333 ? Cancer Care: Provides financial assistance, online support groups, medication/co-pay assistance.  1-800-813-HOPE (4673) ? [facility] Assists [place] cancer patients and their families through emotional , educational and financial support.  336-427-4357 ? [place] DSS Where to apply for food stamps, Medicaid and utility assistance. 336-342-1394 ? [facility]: Transportation to medical appointments. 336-347-2287 ? Social Security Administration: May apply for disability if have a Stage IV cancer. 336-342-7796 1-800-772-1213 ? [place] Aging, Disability and Transit Services: Assists with nutrition, care and transit needs. 336-349-2343  Cancer Center Support Programs: @10RELATIVEDAYS@ > Cancer Support Group  2nd Tuesday of the month 1pm-2pm, Journey Room  > Creative Journey  3rd Tuesday of the month 1130am-1pm, Journey Room  > Look Good Feel Better  1st Wednesday of the month 10am-12 noon, Journey Room (Call American Cancer Society to register 1-800-395-5775)   

## 2017-08-18 ENCOUNTER — Encounter (HOSPITAL_COMMUNITY): Payer: Self-pay

## 2017-08-18 ENCOUNTER — Inpatient Hospital Stay (HOSPITAL_COMMUNITY): Payer: PPO

## 2017-08-18 ENCOUNTER — Emergency Department (HOSPITAL_COMMUNITY): Payer: PPO

## 2017-08-18 ENCOUNTER — Inpatient Hospital Stay (HOSPITAL_COMMUNITY)
Admission: EM | Admit: 2017-08-18 | Discharge: 2017-08-24 | DRG: 329 | Disposition: A | Discharge status: Hospice | Payer: PPO | Attending: Internal Medicine | Admitting: Internal Medicine

## 2017-08-18 ENCOUNTER — Encounter (HOSPITAL_COMMUNITY)
Admission: EM | Disposition: A | Discharge status: Hospice | Payer: Self-pay | Source: Home / Self Care | Attending: Family Medicine

## 2017-08-18 ENCOUNTER — Other Ambulatory Visit: Payer: Self-pay

## 2017-08-18 ENCOUNTER — Inpatient Hospital Stay (HOSPITAL_BASED_OUTPATIENT_CLINIC_OR_DEPARTMENT_OTHER): Payer: PPO | Admitting: Anesthesiology

## 2017-08-18 ENCOUNTER — Inpatient Hospital Stay: Admit: 2017-08-18 | Payer: PPO | Admitting: General Surgery

## 2017-08-18 ENCOUNTER — Inpatient Hospital Stay (HOSPITAL_COMMUNITY): Payer: PPO | Admitting: Anesthesiology

## 2017-08-18 DIAGNOSIS — K631 Perforation of intestine (nontraumatic): Secondary | ICD-10-CM | POA: Diagnosis not present

## 2017-08-18 DIAGNOSIS — Z7901 Long term (current) use of anticoagulants: Secondary | ICD-10-CM

## 2017-08-18 DIAGNOSIS — E1151 Type 2 diabetes mellitus with diabetic peripheral angiopathy without gangrene: Secondary | ICD-10-CM | POA: Diagnosis not present

## 2017-08-18 DIAGNOSIS — R571 Hypovolemic shock: Secondary | ICD-10-CM | POA: Diagnosis not present

## 2017-08-18 DIAGNOSIS — E1142 Type 2 diabetes mellitus with diabetic polyneuropathy: Secondary | ICD-10-CM | POA: Diagnosis not present

## 2017-08-18 DIAGNOSIS — Z794 Long term (current) use of insulin: Secondary | ICD-10-CM

## 2017-08-18 DIAGNOSIS — Z87891 Personal history of nicotine dependence: Secondary | ICD-10-CM

## 2017-08-18 DIAGNOSIS — Z7189 Other specified counseling: Secondary | ICD-10-CM | POA: Diagnosis not present

## 2017-08-18 DIAGNOSIS — I129 Hypertensive chronic kidney disease with stage 1 through stage 4 chronic kidney disease, or unspecified chronic kidney disease: Secondary | ICD-10-CM | POA: Diagnosis not present

## 2017-08-18 DIAGNOSIS — I959 Hypotension, unspecified: Secondary | ICD-10-CM | POA: Diagnosis present

## 2017-08-18 DIAGNOSIS — E538 Deficiency of other specified B group vitamins: Secondary | ICD-10-CM | POA: Diagnosis present

## 2017-08-18 DIAGNOSIS — K668 Other specified disorders of peritoneum: Secondary | ICD-10-CM | POA: Diagnosis not present

## 2017-08-18 DIAGNOSIS — E873 Alkalosis: Secondary | ICD-10-CM | POA: Diagnosis not present

## 2017-08-18 DIAGNOSIS — R748 Abnormal levels of other serum enzymes: Secondary | ICD-10-CM

## 2017-08-18 DIAGNOSIS — K559 Vascular disorder of intestine, unspecified: Secondary | ICD-10-CM | POA: Diagnosis not present

## 2017-08-18 DIAGNOSIS — M5136 Other intervertebral disc degeneration, lumbar region: Secondary | ICD-10-CM | POA: Diagnosis present

## 2017-08-18 DIAGNOSIS — Z515 Encounter for palliative care: Secondary | ICD-10-CM

## 2017-08-18 DIAGNOSIS — N179 Acute kidney failure, unspecified: Secondary | ICD-10-CM | POA: Diagnosis not present

## 2017-08-18 DIAGNOSIS — E785 Hyperlipidemia, unspecified: Secondary | ICD-10-CM | POA: Diagnosis not present

## 2017-08-18 DIAGNOSIS — D62 Acute posthemorrhagic anemia: Secondary | ICD-10-CM | POA: Diagnosis present

## 2017-08-18 DIAGNOSIS — C7A00 Malignant carcinoid tumor of unspecified site: Secondary | ICD-10-CM | POA: Diagnosis present

## 2017-08-18 DIAGNOSIS — I739 Peripheral vascular disease, unspecified: Secondary | ICD-10-CM | POA: Diagnosis present

## 2017-08-18 DIAGNOSIS — J9602 Acute respiratory failure with hypercapnia: Secondary | ICD-10-CM

## 2017-08-18 DIAGNOSIS — Z9049 Acquired absence of other specified parts of digestive tract: Secondary | ICD-10-CM

## 2017-08-18 DIAGNOSIS — I6523 Occlusion and stenosis of bilateral carotid arteries: Secondary | ICD-10-CM | POA: Diagnosis present

## 2017-08-18 DIAGNOSIS — J9601 Acute respiratory failure with hypoxia: Secondary | ICD-10-CM | POA: Diagnosis not present

## 2017-08-18 DIAGNOSIS — N183 Chronic kidney disease, stage 3 unspecified: Secondary | ICD-10-CM | POA: Diagnosis present

## 2017-08-18 DIAGNOSIS — Z452 Encounter for adjustment and management of vascular access device: Secondary | ICD-10-CM

## 2017-08-18 DIAGNOSIS — Z7982 Long term (current) use of aspirin: Secondary | ICD-10-CM

## 2017-08-18 DIAGNOSIS — R55 Syncope and collapse: Secondary | ICD-10-CM

## 2017-08-18 DIAGNOSIS — D689 Coagulation defect, unspecified: Secondary | ICD-10-CM | POA: Diagnosis present

## 2017-08-18 DIAGNOSIS — E119 Type 2 diabetes mellitus without complications: Secondary | ICD-10-CM

## 2017-08-18 DIAGNOSIS — I480 Paroxysmal atrial fibrillation: Secondary | ICD-10-CM | POA: Diagnosis present

## 2017-08-18 DIAGNOSIS — C787 Secondary malignant neoplasm of liver and intrahepatic bile duct: Secondary | ICD-10-CM | POA: Diagnosis not present

## 2017-08-18 DIAGNOSIS — K311 Adult hypertrophic pyloric stenosis: Secondary | ICD-10-CM | POA: Diagnosis not present

## 2017-08-18 DIAGNOSIS — R197 Diarrhea, unspecified: Secondary | ICD-10-CM | POA: Diagnosis present

## 2017-08-18 DIAGNOSIS — C7B02 Secondary carcinoid tumors of liver: Secondary | ICD-10-CM | POA: Diagnosis not present

## 2017-08-18 DIAGNOSIS — I2510 Atherosclerotic heart disease of native coronary artery without angina pectoris: Secondary | ICD-10-CM | POA: Diagnosis present

## 2017-08-18 DIAGNOSIS — N200 Calculus of kidney: Secondary | ICD-10-CM | POA: Diagnosis not present

## 2017-08-18 DIAGNOSIS — I10 Essential (primary) hypertension: Secondary | ICD-10-CM | POA: Diagnosis not present

## 2017-08-18 DIAGNOSIS — J9811 Atelectasis: Secondary | ICD-10-CM | POA: Diagnosis not present

## 2017-08-18 DIAGNOSIS — D631 Anemia in chronic kidney disease: Secondary | ICD-10-CM | POA: Diagnosis not present

## 2017-08-18 DIAGNOSIS — Z8673 Personal history of transient ischemic attack (TIA), and cerebral infarction without residual deficits: Secondary | ICD-10-CM

## 2017-08-18 DIAGNOSIS — D649 Anemia, unspecified: Secondary | ICD-10-CM | POA: Diagnosis not present

## 2017-08-18 DIAGNOSIS — Z66 Do not resuscitate: Secondary | ICD-10-CM | POA: Diagnosis present

## 2017-08-18 DIAGNOSIS — E1165 Type 2 diabetes mellitus with hyperglycemia: Secondary | ICD-10-CM | POA: Diagnosis present

## 2017-08-18 DIAGNOSIS — R188 Other ascites: Secondary | ICD-10-CM | POA: Diagnosis present

## 2017-08-18 DIAGNOSIS — K900 Celiac disease: Secondary | ICD-10-CM | POA: Diagnosis present

## 2017-08-18 DIAGNOSIS — J90 Pleural effusion, not elsewhere classified: Secondary | ICD-10-CM | POA: Diagnosis not present

## 2017-08-18 DIAGNOSIS — K55029 Acute infarction of small intestine, extent unspecified: Secondary | ICD-10-CM | POA: Diagnosis not present

## 2017-08-18 DIAGNOSIS — L8990 Pressure ulcer of unspecified site, unspecified stage: Secondary | ICD-10-CM | POA: Diagnosis not present

## 2017-08-18 DIAGNOSIS — Z01818 Encounter for other preprocedural examination: Secondary | ICD-10-CM

## 2017-08-18 DIAGNOSIS — E1122 Type 2 diabetes mellitus with diabetic chronic kidney disease: Secondary | ICD-10-CM | POA: Diagnosis present

## 2017-08-18 DIAGNOSIS — C786 Secondary malignant neoplasm of retroperitoneum and peritoneum: Secondary | ICD-10-CM | POA: Diagnosis not present

## 2017-08-18 DIAGNOSIS — K660 Peritoneal adhesions (postprocedural) (postinfection): Secondary | ICD-10-CM | POA: Diagnosis present

## 2017-08-18 HISTORY — PX: OSTOMY: SHX5997

## 2017-08-18 HISTORY — DX: Malignant (primary) neoplasm, unspecified: C80.1

## 2017-08-18 HISTORY — PX: BOWEL RESECTION: SHX1257

## 2017-08-18 HISTORY — PX: LAPAROTOMY: SHX154

## 2017-08-18 LAB — COMPREHENSIVE METABOLIC PANEL
ALBUMIN: 2.8 g/dL — AB (ref 3.5–5.0)
ALK PHOS: 65 U/L (ref 38–126)
ALT: 9 U/L — AB (ref 17–63)
AST: 12 U/L — AB (ref 15–41)
Anion gap: 8 (ref 5–15)
BILIRUBIN TOTAL: 0.6 mg/dL (ref 0.3–1.2)
BUN: 94 mg/dL — AB (ref 6–20)
CALCIUM: 8.3 mg/dL — AB (ref 8.9–10.3)
CO2: 22 mmol/L (ref 22–32)
Chloride: 103 mmol/L (ref 101–111)
Creatinine, Ser: 2.97 mg/dL — ABNORMAL HIGH (ref 0.61–1.24)
GFR calc Af Amer: 22 mL/min — ABNORMAL LOW (ref >60)
GFR calc non Af Amer: 19 mL/min — ABNORMAL LOW (ref >60)
GLUCOSE: 135 mg/dL — AB (ref 65–99)
Potassium: 4.4 mmol/L (ref 3.5–5.1)
SODIUM: 133 mmol/L — AB (ref 135–145)
TOTAL PROTEIN: 6 g/dL — AB (ref 6.5–8.1)

## 2017-08-18 LAB — BLOOD GAS, ARTERIAL
Acid-base deficit: 15.1 mmol/L — ABNORMAL HIGH (ref 0.0–2.0)
Bicarbonate: 12.8 mmol/L — ABNORMAL LOW (ref 20.0–28.0)
Drawn by: 28459
FIO2: 50
LHR: 15 {breaths}/min
MECHVT: 500 mL
O2 Saturation: 99.1 %
PATIENT TEMPERATURE: 37
PEEP: 5 cmH2O
PO2 ART: 243 mmHg — AB (ref 83.0–108.0)
pCO2 arterial: 27.3 mmHg — ABNORMAL LOW (ref 32.0–48.0)
pH, Arterial: 7.229 — ABNORMAL LOW (ref 7.350–7.450)

## 2017-08-18 LAB — CBC WITH DIFFERENTIAL/PLATELET
BASOS ABS: 0 10*3/uL (ref 0.0–0.1)
BASOS PCT: 0 %
Basophils Absolute: 0 10*3/uL (ref 0.0–0.1)
Basophils Relative: 0 %
EOS PCT: 1 %
Eosinophils Absolute: 0.1 10*3/uL (ref 0.0–0.7)
Eosinophils Absolute: 0 10*3/uL (ref 0.0–0.7)
Eosinophils Relative: 0 %
HCT: 28.6 % — ABNORMAL LOW (ref 39.0–52.0)
HEMATOCRIT: 27.4 % — AB (ref 39.0–52.0)
HEMOGLOBIN: 8.8 g/dL — AB (ref 13.0–17.0)
HEMOGLOBIN: 8.6 g/dL — AB (ref 13.0–17.0)
LYMPHS PCT: 11 %
Lymphocytes Relative: 6 %
Lymphs Abs: 0.5 10*3/uL — ABNORMAL LOW (ref 0.7–4.0)
Lymphs Abs: 1.5 10*3/uL (ref 0.7–4.0)
MCH: 30 pg (ref 26.0–34.0)
MCH: 29 pg (ref 26.0–34.0)
MCHC: 30.8 g/dL (ref 30.0–36.0)
MCHC: 31.4 g/dL (ref 30.0–36.0)
MCV: 97.6 fL (ref 78.0–100.0)
MCV: 92.3 fL (ref 78.0–100.0)
MONO ABS: 0.5 10*3/uL (ref 0.1–1.0)
MONO ABS: 0.4 10*3/uL (ref 0.1–1.0)
MONOS PCT: 6 %
Monocytes Relative: 3 %
NEUTROS ABS: 7.4 10*3/uL (ref 1.7–7.7)
NEUTROS ABS: 10.9 10*3/uL — AB (ref 1.7–7.7)
Neutrophils Relative %: 87 %
Neutrophils Relative %: 86 %
PLATELETS: 224 10*3/uL (ref 150–400)
Platelets: 188 10*3/uL (ref 150–400)
RBC: 2.93 MIL/uL — ABNORMAL LOW (ref 4.22–5.81)
RBC: 2.97 MIL/uL — ABNORMAL LOW (ref 4.22–5.81)
RDW: 18.6 % — AB (ref 11.5–15.5)
RDW: 17.9 % — AB (ref 11.5–15.5)
WBC: 8.5 10*3/uL (ref 4.0–10.5)
WBC: 12.7 10*3/uL — ABNORMAL HIGH (ref 4.0–10.5)

## 2017-08-18 LAB — PHOSPHORUS: PHOSPHORUS: 5.7 mg/dL — AB (ref 2.5–4.6)

## 2017-08-18 LAB — URINALYSIS, ROUTINE W REFLEX MICROSCOPIC
Bilirubin Urine: NEGATIVE
Glucose, UA: NEGATIVE mg/dL
Ketones, ur: NEGATIVE mg/dL
Nitrite: NEGATIVE
PH: 5 (ref 5.0–8.0)
Protein, ur: NEGATIVE mg/dL
SPECIFIC GRAVITY, URINE: 1.011 (ref 1.005–1.030)

## 2017-08-18 LAB — BASIC METABOLIC PANEL
ANION GAP: 11 (ref 5–15)
BUN: 77 mg/dL — ABNORMAL HIGH (ref 6–20)
CALCIUM: 6.7 mg/dL — AB (ref 8.9–10.3)
CO2: 10 mmol/L — ABNORMAL LOW (ref 22–32)
CREATININE: 2.61 mg/dL — AB (ref 0.61–1.24)
Chloride: 111 mmol/L (ref 101–111)
GFR calc Af Amer: 26 mL/min — ABNORMAL LOW (ref >60)
GFR, EST NON AFRICAN AMERICAN: 22 mL/min — AB (ref >60)
Glucose, Bld: 215 mg/dL — ABNORMAL HIGH (ref 65–99)
Potassium: 4.5 mmol/L (ref 3.5–5.1)
Sodium: 132 mmol/L — ABNORMAL LOW (ref 135–145)

## 2017-08-18 LAB — MAGNESIUM: MAGNESIUM: 1.5 mg/dL — AB (ref 1.7–2.4)

## 2017-08-18 LAB — PROTIME-INR
INR: 2.3
PROTHROMBIN TIME: 25.1 s — AB (ref 11.4–15.2)

## 2017-08-18 LAB — GLUCOSE, CAPILLARY: Glucose-Capillary: 121 mg/dL — ABNORMAL HIGH (ref 65–99)

## 2017-08-18 LAB — PREPARE RBC (CROSSMATCH)

## 2017-08-18 LAB — TROPONIN I: Troponin I: 0.03 ng/mL (ref <0.03)

## 2017-08-18 SURGERY — LAPAROTOMY, EXPLORATORY
Anesthesia: General | Site: Abdomen | Laterality: Right

## 2017-08-18 MED ORDER — SUCCINYLCHOLINE CHLORIDE 20 MG/ML IJ SOLN
INTRAMUSCULAR | Status: DC | PRN
  Administered 2017-08-18: 120 mg via INTRAVENOUS

## 2017-08-18 MED ORDER — SODIUM BICARBONATE 8.4 % IV SOLN
INTRAVENOUS | Status: AC
  Filled 2017-08-18: qty 150

## 2017-08-18 MED ORDER — MIDAZOLAM HCL 2 MG/2ML IJ SOLN
1.0000 mg | INTRAMUSCULAR | Status: DC | PRN
  Administered 2017-08-18 – 2017-08-20 (×2): 1 mg via INTRAVENOUS
  Filled 2017-08-18 (×2): qty 2

## 2017-08-18 MED ORDER — SODIUM BICARBONATE 8.4 % IV SOLN
INTRAVENOUS | Status: DC
  Administered 2017-08-18 – 2017-08-20 (×4): via INTRAVENOUS
  Filled 2017-08-18 (×12): qty 150

## 2017-08-18 MED ORDER — SODIUM CHLORIDE 0.9 % IV SOLN: 25.0000 ug/h | INTRAVENOUS | Status: DC

## 2017-08-18 MED ORDER — CHLORHEXIDINE GLUCONATE 0.12% ORAL RINSE (MEDLINE KIT)
15.0000 mL | Freq: Two times a day (BID) | OROMUCOSAL | Status: DC
  Administered 2017-08-18 – 2017-08-23 (×9): 15 mL via OROMUCOSAL

## 2017-08-18 MED ORDER — LIDOCAINE HCL (PF) 1 % IJ SOLN
INTRAMUSCULAR | Status: AC
  Filled 2017-08-18: qty 5

## 2017-08-18 MED ORDER — ROCURONIUM BROMIDE 50 MG/5ML IV SOLN
INTRAVENOUS | Status: AC
  Filled 2017-08-18: qty 1

## 2017-08-18 MED ORDER — NOREPINEPHRINE 4 MG/250ML-% IV SOLN
INTRAVENOUS | Status: AC
  Filled 2017-08-18: qty 250

## 2017-08-18 MED ORDER — CEFOTETAN DISODIUM-DEXTROSE 2-2.08 GM-%(50ML) IV SOLR
2.0000 g | INTRAVENOUS | Status: AC
  Administered 2017-08-18: 2 g via INTRAVENOUS

## 2017-08-18 MED ORDER — CEFOTETAN DISODIUM-DEXTROSE 2-2.08 GM-%(50ML) IV SOLR
INTRAVENOUS | Status: AC
Start: 2017-08-18 — End: 2017-08-18
  Filled 2017-08-18: qty 50

## 2017-08-18 MED ORDER — ETOMIDATE 2 MG/ML IV SOLN
INTRAVENOUS | Status: AC
  Filled 2017-08-18: qty 20

## 2017-08-18 MED ORDER — CHLORHEXIDINE GLUCONATE CLOTH 2 % EX PADS: 6.0000 | MEDICATED_PAD | Freq: Once | CUTANEOUS | Status: DC

## 2017-08-18 MED ORDER — NOREPINEPHRINE BITARTRATE 1 MG/ML IV SOLN
INTRAVENOUS | Status: DC | PRN
  Administered 2017-08-18 (×2): 16 ug/min via INTRAVENOUS

## 2017-08-18 MED ORDER — ACETAMINOPHEN 325 MG PO TABS: 650.0000 mg | ORAL_TABLET | Freq: Four times a day (QID) | ORAL | Status: DC | PRN

## 2017-08-18 MED ORDER — PHENYLEPHRINE 40 MCG/ML (10ML) SYRINGE FOR IV PUSH (FOR BLOOD PRESSURE SUPPORT)
PREFILLED_SYRINGE | INTRAVENOUS | Status: AC
  Filled 2017-08-18: qty 10

## 2017-08-18 MED ORDER — LIDOCAINE HCL (CARDIAC) 20 MG/ML IV SOLN
INTRAVENOUS | Status: DC | PRN
  Administered 2017-08-18: 30 mg via INTRAVENOUS

## 2017-08-18 MED ORDER — ETOMIDATE 2 MG/ML IV SOLN
INTRAVENOUS | Status: DC | PRN
  Administered 2017-08-18: 12 mg via INTRAVENOUS

## 2017-08-18 MED ORDER — FENTANYL CITRATE (PF) 250 MCG/5ML IJ SOLN
INTRAMUSCULAR | Status: AC
  Filled 2017-08-18: qty 5

## 2017-08-18 MED ORDER — FENTANYL CITRATE (PF) 100 MCG/2ML IJ SOLN
50.0000 ug | INTRAMUSCULAR | Status: DC | PRN
  Administered 2017-08-18 – 2017-08-19 (×2): 50 ug via INTRAVENOUS
  Filled 2017-08-18 (×2): qty 2

## 2017-08-18 MED ORDER — PIPERACILLIN-TAZOBACTAM 3.375 G IVPB
INTRAVENOUS | Status: AC
  Filled 2017-08-18: qty 50

## 2017-08-18 MED ORDER — INSULIN ASPART 100 UNIT/ML ~~LOC~~ SOLN
0.0000 [IU] | SUBCUTANEOUS | Status: DC
  Administered 2017-08-19 (×2): 3 [IU] via SUBCUTANEOUS
  Administered 2017-08-19: 5 [IU] via SUBCUTANEOUS
  Administered 2017-08-19: 2 [IU] via SUBCUTANEOUS
  Administered 2017-08-19: 3 [IU] via SUBCUTANEOUS
  Administered 2017-08-20 (×2): 2 [IU] via SUBCUTANEOUS
  Administered 2017-08-20: 1 [IU] via SUBCUTANEOUS
  Administered 2017-08-20: 2 [IU] via SUBCUTANEOUS
  Administered 2017-08-20: 1 [IU] via SUBCUTANEOUS
  Administered 2017-08-20: 2 [IU] via SUBCUTANEOUS
  Administered 2017-08-20 – 2017-08-22 (×8): 1 [IU] via SUBCUTANEOUS
  Administered 2017-08-22: 2 [IU] via SUBCUTANEOUS
  Administered 2017-08-22: 1 [IU] via SUBCUTANEOUS

## 2017-08-18 MED ORDER — SODIUM CHLORIDE 0.9 % IV SOLN
INTRAVENOUS | Status: AC
  Administered 2017-08-18 – 2017-08-19 (×2): via INTRAVENOUS

## 2017-08-18 MED ORDER — FENTANYL CITRATE (PF) 100 MCG/2ML IJ SOLN
INTRAMUSCULAR | Status: DC | PRN
  Administered 2017-08-18: 100 ug via INTRAVENOUS
  Administered 2017-08-18 (×2): 50 ug via INTRAVENOUS

## 2017-08-18 MED ORDER — ONDANSETRON HCL 4 MG PO TABS: 4.0000 mg | ORAL_TABLET | Freq: Four times a day (QID) | ORAL | Status: DC | PRN

## 2017-08-18 MED ORDER — PHENYLEPHRINE 40 MCG/ML (10ML) SYRINGE FOR IV PUSH (FOR BLOOD PRESSURE SUPPORT)
PREFILLED_SYRINGE | INTRAVENOUS | Status: AC
  Filled 2017-08-18: qty 20

## 2017-08-18 MED ORDER — SODIUM CHLORIDE 0.9 % IV SOLN
0.0300 [IU]/min | INTRAVENOUS | Status: DC
  Administered 2017-08-18 – 2017-08-20 (×3): 0.03 [IU]/min via INTRAVENOUS
  Filled 2017-08-18 (×2): qty 2

## 2017-08-18 MED ORDER — HEMOSTATIC AGENTS (NO CHARGE) OPTIME
TOPICAL | Status: DC | PRN
  Administered 2017-08-18: 1 via TOPICAL

## 2017-08-18 MED ORDER — FENTANYL CITRATE (PF) 100 MCG/2ML IJ SOLN: 50.0000 ug | Freq: Once | INTRAMUSCULAR | Status: DC

## 2017-08-18 MED ORDER — INSULIN ASPART 100 UNIT/ML ~~LOC~~ SOLN: 0.0000 [IU] | Freq: Three times a day (TID) | SUBCUTANEOUS | Status: DC

## 2017-08-18 MED ORDER — PANTOPRAZOLE SODIUM 40 MG IV SOLR
40.0000 mg | Freq: Every day | INTRAVENOUS | Status: DC
  Administered 2017-08-18 – 2017-08-24 (×7): 40 mg via INTRAVENOUS
  Filled 2017-08-18 (×7): qty 40

## 2017-08-18 MED ORDER — ACETAMINOPHEN 650 MG RE SUPP: 650.0000 mg | Freq: Four times a day (QID) | RECTAL | Status: DC | PRN

## 2017-08-18 MED ORDER — FENTANYL CITRATE (PF) 100 MCG/2ML IJ SOLN
50.0000 ug | INTRAMUSCULAR | Status: DC | PRN
  Administered 2017-08-18: 50 ug via INTRAVENOUS
  Filled 2017-08-18: qty 2

## 2017-08-18 MED ORDER — PIPERACILLIN-TAZOBACTAM 3.375 G IVPB
3.3750 g | Freq: Three times a day (TID) | INTRAVENOUS | Status: DC
Start: 2017-08-18 — End: 2017-08-23
  Administered 2017-08-18 – 2017-08-23 (×16): 3.375 g via INTRAVENOUS
  Filled 2017-08-18 (×14): qty 50

## 2017-08-18 MED ORDER — MAGNESIUM SULFATE 2 GM/50ML IV SOLN
2.0000 g | Freq: Once | INTRAVENOUS | Status: AC
  Administered 2017-08-18: 2 g via INTRAVENOUS
  Filled 2017-08-18: qty 50

## 2017-08-18 MED ORDER — THIAMINE HCL 100 MG/ML IJ SOLN
100.0000 mg | Freq: Once | INTRAMUSCULAR | Status: AC
  Administered 2017-08-18: 100 mg via INTRAVENOUS
  Filled 2017-08-18: qty 2

## 2017-08-18 MED ORDER — FENTANYL BOLUS VIA INFUSION: 50.0000 ug | INTRAVENOUS | Status: DC | PRN

## 2017-08-18 MED ORDER — SODIUM CHLORIDE 0.9 % IV SOLN
INTRAVENOUS | Status: DC | PRN
  Administered 2017-08-18 (×2): via INTRAVENOUS

## 2017-08-18 MED ORDER — SODIUM CHLORIDE 0.9 % IR SOLN
Status: DC | PRN
  Administered 2017-08-18 (×8): 1000 mL

## 2017-08-18 MED ORDER — LACTATED RINGERS IV SOLN: INTRAVENOUS | Status: DC

## 2017-08-18 MED ORDER — MIDAZOLAM HCL 5 MG/5ML IJ SOLN
INTRAMUSCULAR | Status: DC | PRN
  Administered 2017-08-18 (×2): 2 mg via INTRAVENOUS

## 2017-08-18 MED ORDER — SODIUM CHLORIDE 0.9 % IV BOLUS (SEPSIS)
2000.0000 mL | Freq: Once | INTRAVENOUS | Status: AC
  Administered 2017-08-18: 2000 mL via INTRAVENOUS

## 2017-08-18 MED ORDER — MIDAZOLAM HCL 2 MG/2ML IJ SOLN
INTRAMUSCULAR | Status: AC
  Filled 2017-08-18: qty 2

## 2017-08-18 MED ORDER — ORAL CARE MOUTH RINSE
15.0000 mL | Freq: Four times a day (QID) | OROMUCOSAL | Status: DC
  Administered 2017-08-19 – 2017-08-23 (×18): 15 mL via OROMUCOSAL

## 2017-08-18 MED ORDER — EPHEDRINE SULFATE 50 MG/ML IJ SOLN
INTRAMUSCULAR | Status: DC | PRN
  Administered 2017-08-18: 5 mg via INTRAVENOUS
  Administered 2017-08-18 (×6): 10 mg via INTRAVENOUS

## 2017-08-18 MED ORDER — LIDOCAINE HCL (PF) 1 % IJ SOLN
INTRAMUSCULAR | Status: AC
  Filled 2017-08-18: qty 2

## 2017-08-18 MED ORDER — VASOPRESSIN 20 UNIT/ML IV SOLN
INTRAVENOUS | Status: AC
  Filled 2017-08-18: qty 2

## 2017-08-18 MED ORDER — NOREPINEPHRINE BITARTRATE 1 MG/ML IV SOLN
0.0000 ug/min | INTRAVENOUS | Status: DC
  Administered 2017-08-18: 18 ug/min via INTRAVENOUS
  Administered 2017-08-19: 10 ug/min via INTRAVENOUS
  Administered 2017-08-19: 4 ug/min via INTRAVENOUS
  Administered 2017-08-19: 14 ug/min via INTRAVENOUS
  Administered 2017-08-21: 3.5 ug/min via INTRAVENOUS
  Administered 2017-08-24: 2 ug/min via INTRAVENOUS
  Filled 2017-08-18: qty 4

## 2017-08-18 MED ORDER — PIPERACILLIN-TAZOBACTAM 3.375 G IVPB 30 MIN: 3.3750 g | Freq: Four times a day (QID) | INTRAVENOUS | Status: DC

## 2017-08-18 MED ORDER — STERILE WATER FOR INJECTION IV SOLN
INTRAVENOUS | Status: DC
  Filled 2017-08-18: qty 9.71

## 2017-08-18 MED ORDER — ROCURONIUM BROMIDE 100 MG/10ML IV SOLN
INTRAVENOUS | Status: DC | PRN
  Administered 2017-08-18: 20 mg via INTRAVENOUS
  Administered 2017-08-18 (×2): 10 mg via INTRAVENOUS
  Administered 2017-08-18: 20 mg via INTRAVENOUS

## 2017-08-18 MED ORDER — HEMOSTATIC AGENTS (NO CHARGE) OPTIME
TOPICAL | Status: DC | PRN
  Administered 2017-08-18 (×5): 1 via TOPICAL

## 2017-08-18 MED ORDER — MIDAZOLAM HCL 2 MG/2ML IJ SOLN: 1.0000 mg | INTRAMUSCULAR | Status: DC | PRN

## 2017-08-18 MED ORDER — PHENYLEPHRINE HCL 10 MG/ML IJ SOLN
INTRAMUSCULAR | Status: DC | PRN
  Administered 2017-08-18 (×5): 80 ug via INTRAVENOUS
  Administered 2017-08-18: 40 ug via INTRAVENOUS
  Administered 2017-08-18: 80 ug via INTRAVENOUS

## 2017-08-18 MED ORDER — FENTANYL CITRATE (PF) 100 MCG/2ML IJ SOLN: 25.0000 ug | INTRAMUSCULAR | Status: DC | PRN

## 2017-08-18 MED ORDER — ONDANSETRON HCL 4 MG/2ML IJ SOLN: 4.0000 mg | Freq: Four times a day (QID) | INTRAMUSCULAR | Status: DC | PRN

## 2017-08-18 SURGICAL SUPPLY — 74 items
APPLIER CLIP 11 MED OPEN (CLIP)
APPLIER CLIP 13 LRG OPEN (CLIP)
BAG HAMPER (MISCELLANEOUS) ×5 IMPLANT
BARRIER SKIN 2 3/4 (OSTOMY) IMPLANT
BARRIER SKIN 2 3/4 INCH (OSTOMY)
BENZOIN TINCTURE PRP APPL 2/3 (GAUZE/BANDAGES/DRESSINGS) ×5 IMPLANT
CELLS DAT CNTRL 66122 CELL SVR (MISCELLANEOUS) IMPLANT
CHLORAPREP W/TINT 26ML (MISCELLANEOUS) IMPLANT
CLAMP POUCH DRAINAGE QUIET (OSTOMY) IMPLANT
CLIP APPLIE 11 MED OPEN (CLIP) IMPLANT
CLIP APPLIE 13 LRG OPEN (CLIP) IMPLANT
CLOTH BEACON ORANGE TIMEOUT ST (SAFETY) ×5 IMPLANT
COVER LIGHT HANDLE STERIS (MISCELLANEOUS) ×10 IMPLANT
DRAPE WARM FLUID 44X44 (DRAPE) ×5 IMPLANT
DRESSING ALLEVYN LIFE SACRUM (GAUZE/BANDAGES/DRESSINGS) ×5 IMPLANT
DRSG OPSITE POSTOP 4X10 (GAUZE/BANDAGES/DRESSINGS) ×5 IMPLANT
ELECT BLADE 6 FLAT ULTRCLN (ELECTRODE) ×5 IMPLANT
ELECT REM PT RETURN 9FT ADLT (ELECTROSURGICAL) ×5
ELECTRODE REM PT RTRN 9FT ADLT (ELECTROSURGICAL) ×3 IMPLANT
EVACUATOR DRAINAGE 10X20 100CC (DRAIN) ×3 IMPLANT
EVACUATOR SILICONE 100CC (DRAIN) ×2
GAUZE SPONGE 4X4 12PLY STRL (GAUZE/BANDAGES/DRESSINGS) ×5 IMPLANT
GLOVE BIO SURGEON STRL SZ 6.5 (GLOVE) ×4 IMPLANT
GLOVE BIO SURGEONS STRL SZ 6.5 (GLOVE) ×1
GLOVE BIOGEL PI IND STRL 6.5 (GLOVE) ×3 IMPLANT
GLOVE BIOGEL PI IND STRL 7.0 (GLOVE) ×6 IMPLANT
GLOVE BIOGEL PI INDICATOR 6.5 (GLOVE) ×2
GLOVE BIOGEL PI INDICATOR 7.0 (GLOVE) ×4
GLOVE SURG SS PI 7.5 STRL IVOR (GLOVE) ×5 IMPLANT
GOWN STRL REUS W/TWL LRG LVL3 (GOWN DISPOSABLE) ×15 IMPLANT
HANDLE SUCTION POOLE (INSTRUMENTS) ×3 IMPLANT
HEMOSTAT ARISTA ABSORB 3G PWDR (MISCELLANEOUS) ×5 IMPLANT
HEMOSTAT SURGICEL 4X8 (HEMOSTASIS) ×10 IMPLANT
INST SET MAJOR GENERAL (KITS) ×5 IMPLANT
KIT REMOVER STAPLE SKIN (MISCELLANEOUS) ×5 IMPLANT
KIT ROOM TURNOVER APOR (KITS) ×5 IMPLANT
LIGASURE IMPACT 36 18CM CVD LR (INSTRUMENTS) ×5 IMPLANT
MANIFOLD NEPTUNE II (INSTRUMENTS) ×5 IMPLANT
NEEDLE HYPO 18GX1.5 BLUNT FILL (NEEDLE) ×5 IMPLANT
NS IRRIG 1000ML POUR BTL (IV SOLUTION) ×40 IMPLANT
PACK ABDOMINAL MAJOR (CUSTOM PROCEDURE TRAY) ×5 IMPLANT
PAD ARMBOARD 7.5X6 YLW CONV (MISCELLANEOUS) ×5 IMPLANT
POUCH OSTOMY 2 3/4  H 3804 (WOUND CARE) ×2
POUCH OSTOMY 2 PC DRNBL 2.75 (WOUND CARE) ×3 IMPLANT
RELOAD LINEAR CUT PROX 55 BLUE (ENDOMECHANICALS) IMPLANT
RELOAD PROXIMATE 75MM BLUE (ENDOMECHANICALS) ×10 IMPLANT
RETRACTOR WND ALEXIS 25 LRG (MISCELLANEOUS) ×3 IMPLANT
RTRCTR WOUND ALEXIS 18CM MED (MISCELLANEOUS)
RTRCTR WOUND ALEXIS 25CM LRG (MISCELLANEOUS) ×5
SET BASIN LINEN APH (SET/KITS/TRAYS/PACK) ×5 IMPLANT
SPONGE DRAIN TRACH 4X4 STRL 2S (GAUZE/BANDAGES/DRESSINGS) ×5 IMPLANT
SPONGE LAP 18X18 X RAY DECT (DISPOSABLE) ×25 IMPLANT
SPONGE SURGIFOAM ABS GEL 100 (HEMOSTASIS) ×10 IMPLANT
STAPLER GUN LINEAR PROX 60 (STAPLE) IMPLANT
STAPLER PROXIMATE 55 BLUE (STAPLE) IMPLANT
STAPLER PROXIMATE 75MM BLUE (STAPLE) ×5 IMPLANT
STAPLER VISISTAT (STAPLE) ×5 IMPLANT
SUCTION POOLE HANDLE (INSTRUMENTS) ×5
SUT CHROMIC 0 SH (SUTURE) IMPLANT
SUT CHROMIC 2 0 SH (SUTURE) IMPLANT
SUT CHROMIC 3 0 SH 27 (SUTURE) ×20 IMPLANT
SUT ETHILON 3 0 FSL (SUTURE) ×5 IMPLANT
SUT NOVA NAB GS-26 0 60 (SUTURE) ×10 IMPLANT
SUT PDS AB 0 CTX 60 (SUTURE) IMPLANT
SUT PDS AB CT VIOLET #0 27IN (SUTURE) ×10 IMPLANT
SUT PROLENE 2 0 SH 30 (SUTURE) ×5 IMPLANT
SUT SILK 2 0 (SUTURE) ×2
SUT SILK 2-0 18XBRD TIE 12 (SUTURE) ×3 IMPLANT
SUT SILK 3 0 SH CR/8 (SUTURE) ×10 IMPLANT
SWAB CULTURE LIQ STUART DBL (MISCELLANEOUS) ×5 IMPLANT
SYR 20CC LL (SYRINGE) ×5 IMPLANT
SYR BULB IRRIGATION 50ML (SYRINGE) ×5 IMPLANT
TRAY FOLEY CATH SILVER 16FR (SET/KITS/TRAYS/PACK) IMPLANT
TUBE ANAEROBIC PORT A CUL  W/M (MISCELLANEOUS) ×5 IMPLANT

## 2017-08-18 NOTE — H&P (Signed)
[facility] History and Physical  Reason for Referral: Pneumoperitoneum and free fluid  Referring Physician:  Dr. Winfred Leeds   Chief Complaint    Near Syncope      Stephen [name] is a 76 y.o. male.  HPI: [name] is a patient well known to me with newly diagnosed metastatic carcinoid tumor who underwent a gastrojejunostomy and small bowel resection for gastric outlet obstruction related to the carcinoid tumor and ischemic small bowel.  The patient reports that he has been doing fair but started to have less intake and more diarrhea, and this resulted in syncope today.  He denies any abdominal pain or nausea/vomiting, but when he went to the CT scan he started to have some abdominal pain.  He has received 2L boluses in the ED for hypotension, and currently has a BP of 762 systolic.   He has had to receive small doses of steroid during his last hospitalization for bursitis that was causing him significant pain, and was recently started on ibuprofen by the oncologist, but has only taken this 3 times in one day.   Past Medical History:  Diagnosis Date  . Anemia of chronic renal failure, stage 3 (moderate) (HCC)   . Atrial fibrillation ([place])    Documented 08/2016  . B12 deficiency 08/17/2016  . Cancer ([place])    carcinoma of bowelcancer  . Carotid artery occlusion    Right carotid endarterectomy 2001; left carotid endarterectomy in 2008  . Celiac disease   . Chronic renal disease, stage 3, moderately decreased glomerular filtration rate between 30-59 mL/min/1.73 square meter ([place]) 06/01/2012  . Coronary atherosclerosis of native coronary artery    PTCA of LAD in 1994; total obstruction of the RCA treated medically; subsequent stress nuclear study with inferior ischemia  . DDD (degenerative disc disease), lumbar    Lumbar surgery in 1984  . Diabetes mellitus type II   . Essential hypertension   . History of stroke    Right brain 2001; right PCA, MCA/PCA, left SCA  infarcts 08/2017 with atrial fibrillation  . Hyperlipidemia   . Iron deficiency anemia 12/31/2016  . Peripheral vascular disease [facility])     Past Surgical History:  Procedure Laterality Date  . APPENDECTOMY  1970  . BOWEL RESECTION  08/02/2017   Procedure: SMALL BOWEL RESECTION;  Surgeon: Stephen Cagey, MD;  Location: [facility] ORS;  Service: General;;  . CAROTID ENDARTERECTOMY  2008   Left   . CAROTID ENDARTERECTOMY  06/15/2000   Right  . COLONOSCOPY N/A 10/10/2015   Procedure: COLONOSCOPY;  Surgeon: Stephen Houston, MD;  Location: [facility] ENDO SUITE;  Service: Endoscopy;  Laterality: N/A;  12:25 - moved to 11:15 - Ann to notify  . GASTROJEJUNOSTOMY  08/02/2017   Procedure: GASTROJEJUNOSTOMY;  Surgeon: Stephen Cagey, MD;  Location: [facility] ORS;  Service: General;;  . LIVER BIOPSY  08/02/2017   Procedure: LIVER BIOPSY;  Surgeon: Stephen Cagey, MD;  Location: [facility] ORS;  Service: General;;  . LUMBAR [place] SURGERY  2000, 2001   L5 HNP required 2 surgical procedures    Family History  Problem Relation Age of Onset  . Heart disease Mother        Before age 41  . Diabetes Mother   . Heart attack Mother   . Diabetes Sister   . Heart disease Brother   . Diabetes Brother   . Diabetes Daughter   . Diabetes Son   . Hyperlipidemia Son     Social History  Tobacco Use  . Smoking status: Former Smoker    Packs/day: 3.00    Years: 10.00    Pack years: 30.00    Types: Cigarettes    Last attempt to quit: 09/21/1972    Years since quitting: 44.9  . Smokeless tobacco: Never Used  Substance Use Topics  . Alcohol use: No    Alcohol/week: 0.0 oz  . Drug use: No   Current Facility-Administered Medications on File Prior to Encounter  Medication Dose Route Frequency Provider Last Rate Last Dose  . 0.9 %  sodium chloride infusion   Intravenous Continuous Baird Cancer, PA-C 10 mL/hr at 08/31/16 2683    . acetaminophen (TYLENOL) tablet 650 mg  650 mg Oral Once Penland, Kelby Fam, MD        Current Outpatient Medications on File Prior to Encounter  Medication Sig Dispense Refill  . allopurinol (ZYLOPRIM) 100 MG tablet Take 200 mg by mouth daily.     Marland Kitchen apixaban (ELIQUIS) 5 MG TABS tablet Take 1 tablet (5 mg total) by mouth 2 (two) times daily. 180 tablet 3  . aspirin 81 MG tablet Take 81 mg by mouth daily.    Marland Kitchen atenolol (TENORMIN) 25 MG tablet Take 0.5 tablets (12.5 mg total) by mouth daily. 15 tablet 0  . Cholecalciferol (VITAMIN D-3) 1000 units CAPS Take 1 capsule by mouth daily.    . furosemide (LASIX) 40 MG tablet Take 1 tablet (40 mg total) by mouth daily as needed for fluid or edema (or more than 2-lbs of weight gain). (Patient taking differently: Take 60 mg daily by mouth. ) 225 tablet 2  . insulin detemir (LEVEMIR) 100 UNIT/ML injection Inject 0.1 mLs (10 Units total) into the skin at bedtime. 10 mL 11  . losartan (COZAAR) 25 MG tablet Take 1 tablet (25 mg total) by mouth daily. 30 tablet 0  . Melatonin 10 MG CAPS Take 10 mg by mouth at bedtime.    . Multiple Vitamin (MULTIVITAMIN WITH MINERALS) TABS tablet Take 1 tablet by mouth daily.    Marland Kitchen OVER THE COUNTER MEDICATION OTC Sleepinal - patient takes 1 at bedtime.    Marland Kitchen oxyCODONE-acetaminophen (PERCOCET/ROXICET) 5-325 MG tablet Take 1-2 tablets by mouth every 6 (six) hours as needed for severe pain. 60 tablet 0  . pantoprazole (PROTONIX) 40 MG tablet Take 1 tablet (40 mg total) by mouth daily before breakfast. 30 tablet 5  . pravastatin (PRAVACHOL) 40 MG tablet Take 40 mg by mouth daily.    . Probiotic Product (PROBIOTIC DAILY PO) Take 1 tablet daily by mouth.     . Psyllium (EQ DAILY FIBER PO) Take 1 capsule every morning by mouth. Per Patient's daughter states that he takes 1 a every morning.     . sodium bicarbonate 650 MG tablet Take 650 mg by mouth 3 (three) times daily.     . vitamin B-12 (CYANOCOBALAMIN) 1000 MCG tablet Take 1 tablet (1,000 mcg total) by mouth daily. Take 2 tabs by mouth daily. 60 tablet 5  .  acetaminophen (TYLENOL) 500 MG tablet Take 500 mg by mouth every 6 (six) hours as needed.    . docusate sodium (COLACE) 100 MG capsule Take 1 capsule (100 mg total) by mouth daily. 10 capsule 0  . feeding supplement, ENSURE ENLIVE, (ENSURE ENLIVE) LIQD Take 237 mLs by mouth 2 (two) times daily between meals. 90 Bottle 0  . prochlorperazine (COMPAZINE) 10 MG tablet Take 10 mg every 6 (six) hours as needed by mouth for  nausea or vomiting.      No Known Allergies  ROS:  A comprehensive review of systems was negative except for: Constitutional: positive for anorexia, and weakness Gastrointestinal: positive for diarrhea and new onset abdominal pain  Blood pressure (!) 122/51, pulse 77, temperature 97.7 F (36.5 C), temperature source Oral, resp. rate 16, height 6' (1.829 m), weight 185 lb (83.9 kg), SpO2 100 %. Physical Exam  Constitutional: He is oriented to person, place, and time and well-developed, well-nourished, and in no distress.  HENT:  Head: Normocephalic.  Eyes: Pupils are equal, round, and reactive to light.  Cardiovascular: Normal rate.  Pulmonary/Chest: Effort normal.  Abdominal: Soft. He exhibits no distension.  Minimal tenderness in left lower quadrant, incision c/d/i with staples, no erythema or drainage, bruising over the abdomen, no rebound or guarding  Musculoskeletal: He exhibits edema and tenderness.  Right knee swollen and tender  Neurological: He is alert and oriented to person, place, and time.  Skin: Skin is warm and dry.  Psychiatric: Mood, memory, affect and judgment normal.  Vitals reviewed.   Results: Results for orders placed or performed during the hospital encounter of 08/18/17 (from the past 48 hour(s))  Comprehensive metabolic panel     Status: Abnormal   Collection Time: 08/18/17 11:31 AM  Result Value Ref Range   Sodium 133 (L) 135 - 145 mmol/L   Potassium 4.4 3.5 - 5.1 mmol/L   Chloride 103 101 - 111 mmol/L   CO2 22 22 - 32 mmol/L   Glucose, Bld  135 (H) 65 - 99 mg/dL   BUN 94 (H) 6 - 20 mg/dL   Creatinine, Ser 2.97 (H) 0.61 - 1.24 mg/dL   Calcium 8.3 (L) 8.9 - 10.3 mg/dL   Total Protein 6.0 (L) 6.5 - 8.1 g/dL   Albumin 2.8 (L) 3.5 - 5.0 g/dL   AST 12 (L) 15 - 41 U/L   ALT 9 (L) 17 - 63 U/L   Alkaline Phosphatase 65 38 - 126 U/L   Total Bilirubin 0.6 0.3 - 1.2 mg/dL   GFR calc non Af Amer 19 (L) >60 mL/min   GFR calc Af Amer 22 (L) >60 mL/min    Comment: (NOTE) The eGFR has been calculated using the CKD EPI equation. This calculation has not been validated in all clinical situations. eGFR's persistently <60 mL/min signify possible Chronic Kidney Disease.    Anion gap 8 5 - 15  CBC with Differential/Platelet     Status: Abnormal   Collection Time: 08/18/17 11:31 AM  Result Value Ref Range   WBC 8.5 4.0 - 10.5 K/uL   RBC 2.93 (L) 4.22 - 5.81 MIL/uL   Hemoglobin 8.8 (L) 13.0 - 17.0 g/dL   HCT 28.6 (L) 39.0 - 52.0 %   MCV 97.6 78.0 - 100.0 fL   MCH 30.0 26.0 - 34.0 pg   MCHC 30.8 30.0 - 36.0 g/dL   RDW 18.6 (H) 11.5 - 15.5 %   Platelets 224 150 - 400 K/uL   Neutrophils Relative % 87 %   Neutro Abs 7.4 1.7 - 7.7 K/uL   Lymphocytes Relative 6 %   Lymphs Abs 0.5 (L) 0.7 - 4.0 K/uL   Monocytes Relative 6 %   Monocytes Absolute 0.5 0.1 - 1.0 K/uL   Eosinophils Relative 1 %   Eosinophils Absolute 0.1 0.0 - 0.7 K/uL   Basophils Relative 0 %   Basophils Absolute 0.0 0.0 - 0.1 K/uL   Reviewed CT with Dr. Thornton Papas in radiology-  CT a/p non contrast- free air and fluid, and significantly thickened small bowel throughout, no obvious anastomotic leak or breakdown, but difficult to assess due to lack of contrast  Dg Abd Acute W/chest  Result Date: 08/18/2017 CLINICAL DATA:  Diarrhea, weakness. EXAM: DG ABDOMEN ACUTE W/ 1V CHEST COMPARISON:  Radiographs of August 07, 2017. CT scan of July 28, 2017. FINDINGS: No abnormal bowel gas pattern is noted. Left nephrolithiasis is noted. Phleboliths are noted in the pelvis. Midline  surgical staples are noted pneumoperitoneum is noted under both hemidiaphragms which may be due to patient's postoperative status. Heart size and mediastinal contours are within normal limits. Both lungs are clear. IMPRESSION: No evidence of bowel obstruction or ileus. No acute cardiopulmonary disease. Left nephrolithiasis is noted. Pneumoperitoneum is noted under both hemidiaphragms which may be due to patient's postoperative status, although according to ordering physician, this procedure was 15 days ago. The amount of air that remain is concerning for possible rupture of hollow viscus. Unenhanced CT scan of the abdomen pelvis may be performed for further evaluation. Critical Value/emergent results were called by telephone at the time of interpretation on 08/18/2017 at 12:31 pm to Dr. Orlie Dakin , who verbally acknowledged these results. Electronically Signed   By: Marijo Conception, M.D.   On: 08/18/2017 12:31    Assessment & Plan:  ERIVERTO BYRNES is a 76 y.o. male with pneumoperitoneum and free fluid potentially from an anastomotic leak versus from another ischemic portion of bowel given the thickening of this small bowel throughout his abdomen.  Could also be related to steroid or NSAID use but less likely given the timing and duration.  The patient is not very tender and has a left shift with no WBC, but came in hypotensive.  I have discussed with him and his family the gravity of the situation and the need to undergo exploration emergently.  I have discussed the possibility of additional bowel resections, and need for further surgeries, need for intubation.  I have also discussed with him the possibility of needing transfer to a high level of care pending him getting sicker.  He had cardiac evaluation a few weeks ago and was deemed an intermediate risk, but again this is an emergency surgery, so additional risk apply.  He also has been on Eliquis for his A fib, and H&H is down slightly.  We will have  blood set up pending need.   -OR emergently for exploration, possible resection, possible need for additional procedures or transfer  -The patient was previously DNR in the prior admission, and we have discussed that we will honor this, but that during the acute surgical period that we will do everything to resuscitate him    All questions were answered to the satisfaction of the patient and family.  The risk and benefits of exploratory laparotomy with possible resection were discussed including but not limited to infection, bleeding, bowel resection additional surgeries, and need for intubation after surgery.  After careful consideration, BLAKELY GLUTH has decided to proceed.    Stephen [name] 08/18/2017, 2:47 PM

## 2017-08-18 NOTE — ED Notes (Signed)
Surgical and blood consent form signed

## 2017-08-18 NOTE — Anesthesia Preprocedure Evaluation (Signed)
Anesthesia Evaluation  Patient identified by MRN, date of birth, ID band Patient awake    Reviewed: Allergy & Precautions, NPO status , Patient's Chart, lab work & pertinent test results  Airway Mallampati: II  TM Distance: >3 FB     Dental  (+) Partial Lower, Teeth Intact   Pulmonary former smoker,    breath sounds clear to auscultation       Cardiovascular hypertension, Pt. on medications (-) angina+ CAD, + Cardiac Stents and + Peripheral Vascular Disease  + dysrhythmias Atrial Fibrillation  Rhythm:Regular Rate:Normal     Neuro/Psych CVA, Residual Symptoms    GI/Hepatic   Mesenteric mass with bowel obstruction   Endo/Other  diabetes, Type 2  Renal/GU Renal InsufficiencyRenal disease     Musculoskeletal   Abdominal   Peds  Hematology  (+) anemia ,   Anesthesia Other Findings Syncopal episode this am.  Xray= free air, possible perforated viscous. Recent gastrojejunostomy and small bowel resection.  Reproductive/Obstetrics                             Anesthesia Physical Anesthesia Plan  ASA: IV  Anesthesia Plan: General   Post-op Pain Management:    Induction: Intravenous, Rapid sequence and Cricoid pressure planned  PONV Risk Score and Plan:   Airway Management Planned: Oral ETT  Additional Equipment:   Intra-op Plan:   Post-operative Plan: Extubation in OR and Possible Post-op intubation/ventilation  Informed Consent: I have reviewed the patients History and Physical, chart, labs and discussed the procedure including the risks, benefits and alternatives for the proposed anesthesia with the patient or authorized representative who has indicated his/her understanding and acceptance.     Plan Discussed with:   Anesthesia Plan Comments: (amidate induction.)        Anesthesia Quick Evaluation

## 2017-08-18 NOTE — ED Notes (Signed)
Patient back in the room from CT

## 2017-08-18 NOTE — Op Note (Addendum)
[facility] Operative Note  08/18/17  Preoperative Diagnosis: Pneumoperitoneum    Postoperative Diagnosis: Ischemic distal small bowel, intact gastrojejunostomy and small bowel anastomosis   Procedure(s) Performed: Palliative exploratory laparotomy, distal small bowel resection (150cm) and right hemicolectomy, drain placement around the gastrojejunostomy    Surgeon: Lanell Matar. Constance Haw, MD   Assistants: No qualified resident was available   Anesthesia: General endotracheal   Anesthesiologist: Dr. Patsey Berthold    Specimens: Small bowel and right colon    Estimated Blood Loss: 500cc, oozing throughout case    Blood Replacement: 3U pRBC in the OR    Complications: Hypotension and irregular cardiac rhythm throughout the case, requiring blood and pressors    Wound Class: Contaminated   Operative Indications: Mr. Shawn is a 76 yo with recently diagnosed metastatic carcinoid with a large mesentery mass around his SMA that was causing a gastric outlet obstruction. He underwent a exploration and gastrojejunostomy for the gastric outlet obstruction and a small bowel resection for ischemic bowel related to the mesenteric tumor on 08/02/17.  He did fair post operatively, but did have some edema of his anastomosis but no signs of leak on his UGI.  He progressed post operatively, and was advanced to a diet, and was discharged home. He was suppose to follow up with me in clinic tomorrow, but presented to the ED after syncopal event and dehydration. A CXR demonstrated free air, and a CT demonstrated pneumoperitoneum and significant amount of free fluid.  Given this I was concerned about an anastomotic leak or further ischemic bowel from his disease process.     Findings: Ischemic distal small bowel around the prior small bowel anastomosis, no obvious perforation, cloudy ascites, but no bilious drainage, no succus, no purulence or other signs of contamination from a perforation;  gastrojejunostomy intact after leak test    Procedure: The patient was taken to the operating room and placed supine.  A central line was placed in the preoperative holding area, and attempts at an arterial line were unsuccessful due to the patient's small /spasming radial arteries. A foley was placed preoperatively. General endotracheal anesthesia was induced. Intravenous antibiotics were administered per protocol.  An nasogastric tube positioned to decompress the stomach. The abdomen was prepared and draped in the usual sterile fashion with betadine prep.  The prior staples were removed, and the fascia was noted to be intact with only a small seroma overlying the area.  The sutures were cut and removed, and the abdominal cavity was entered with findings of cloudy white ascitic fluid without signs of any bile, succus, or stool.  There was one small adhesion to the anterior abdominal wall to a segment of small bowel, but there was no signs of perforation in this area. A small serosal tear was oversewed with a 3-0 silk suture. The patient was found to have an intact gastrojejunostomy and the entire small bowel was run.  The small bowel anastomosis was intact without any signs of perforation.  The small bowel surrounding the anastomosis was dusky with signs of ischemia from this site distally including the right colon.  This was similar to findings in his prior operation where a segment of distal small bowel was ischemic on the initial exploration.    At this time, we placed the bowel back into the abdomen and performed a leak test of the gastrojejunostomy with saline in the upper abdomen covering the anastomosis, and air was placed through the NG tube.  No bubbles were seen.  There  was no obvious source of perforation.  The colon aside from the right colon was healthy and viable.  The lesser sac was opened and the posterior stomach was explored without finding any evidence of perforation and no sequela such as  bile or succus.  A Kocher maneuver was performed to further visualize the duodenum, and no perforation or contamination was seen in this area.   The abdomen was copiously irrigated, and attention was turned back to the distal small bowel that appeared ischemic still.  Given this appearance and the patient's labile state it was felt that it could have a low flow state that intermittently causes some ischemia secondary to his tumor and to his native atherosclerotic disease.  The bowel was no viable, and was resected.  A linear cutting stapler was used to divide the bowel on the proximal side where the bowel appeared healthy. The small serosal tear that had been repaired and the prior anastomosis was within this specimen.  The right colon was mobilized by dissecting the white line of Toldt, and the peritoneal attachments along the lateral wall were incised to further mobilize the colon.  The distal point of transection was determined in the transverse colon, and a linear cutting stapler was used to divide the colon.  The remaining colon was pink and viable. The mesentery was retracted down by the tumor, and the Ligasure was used to divide the mesentery.    The retroperitoneum was intact and the ureter was noted to be intact and safe. The duodenum was visualized and protected.  The patient had a significant coagulopathy and was oozing from ever surface due to friable nature. The liver edge along the gallbladder was also oozing, but there was no obvious laceration.  Packs were placed and a single vessel overlying the retroperitoneal layer was suture ligated with a 3-0 silk suture.   At this time the patient was very unstable and requiring pressors.  Additional FFP and platelets were sent for for the coagulapathy.  Arista and Gelfoam was placed in the retroperitoneal cavity.  A 10 french JP drain drain was placed around the gastrojejunostomy and brought out from the left upper quadrant. It was secured with a 3-0  nylon suture.    The distal end of the small bowel was found, and this measured approximately 110cm to the jejunal loop that created the gastrojejunostomy, and from this there was approximately 40cm to the ligament of treitz.  In total the patient likely has about 150cm of small bowel remaining.  An elliptical incision was made right of the umbilicus and carried down through the subcutaneous tissue with electrocautery.  The fascia was incised in a cruciate fashion, and was widened to accommodate two finger breadths.  A babcock was used to deliver the small bowel through to the skin level. There was some limited ability to bring the small bowel up to the level of the skin due to the retraction of the mesentery on the small bowel.  The peritoneal layer of the mesentery was scored to aid in releasing tension with some additional length achieved. Final inspection revealed acceptable hemostasis following the Arista and Gelfoam placement into the retroperitoneum and along the liver edge with the gallbladder.    The fascia was closed with a 0 PDS suture, and the skin was closed with staples.  This was covered, and the small bowel, likely jejunumostomy was brought up and matured in a brooked fashion using 3-0 chromic gut sutures.  An ostomy appliance was placed.  All counts were correct at the end of the case, and all lab pads were removed.  The patient was taken to the ICU in critical condition on pressors with a low blood pressure.  I discussed the case with the family following the procedure and discussed his DNR status after the procedure.     Curlene Labrum, MD [facility] [address] [place],  [phone] (office)

## 2017-08-18 NOTE — Progress Notes (Signed)
Pt arrived from OR bagged by CRNA. Pt placed on ventilator

## 2017-08-18 NOTE — Progress Notes (Signed)
[facility] Progress Note Patient Name: Stephen [name] DOB: 1940/11/10 MRN: [id number]   Date of Service  08/18/2017  HPI/Events of Note  hypomag  eICU Interventions  Mag replaced     Intervention Category Intermediate Interventions: Electrolyte abnormality - evaluation and management  Zaidyn Claire 08/18/2017, 11:25 PM

## 2017-08-18 NOTE — ED Notes (Signed)
Patient en route to preop- surgeon, bridges, at bedside explaining procedures. Family with patient

## 2017-08-18 NOTE — Anesthesia Procedure Notes (Addendum)
Procedure Name: Intubation Date/Time: 08/18/2017 5:15 PM Performed by: Andree Elk, Amy A, CRNA Pre-anesthesia Checklist: Patient identified, Patient being monitored, Timeout performed, Emergency Drugs available and Suction available Patient Re-evaluated:Patient Re-evaluated prior to induction Oxygen Delivery Method: Circle System Utilized Preoxygenation: Pre-oxygenation with 100% oxygen Induction Type: IV induction, Rapid sequence and Cricoid Pressure applied Laryngoscope Size: Miller and 3 Grade View: Grade I Tube type: Oral Tube size: 7.0 mm Number of attempts: 1 Airway Equipment and Method: Stylet Placement Confirmation: ETT inserted through vocal cords under direct vision,  positive ETCO2 and breath sounds checked- equal and bilateral Secured at: 21 cm Tube secured with: Tape Dental Injury: Teeth and Oropharynx as per pre-operative assessment

## 2017-08-18 NOTE — ED Triage Notes (Addendum)
Pt brought by EMS due to near syncope . EMS reports that pt up to ambulate and had near syncopal episode. Family reported that eyes rolled back in head and as soon as he sat down he felt better. Pt had abdominal sx a week ago due to cancer. Has staples intact to lower abdomen. Reports he got weak when he stood up. And has had diarrhea since may. CBG 120 on scene and BP 118/60

## 2017-08-18 NOTE — Procedures (Signed)
Procedure Note  08/18/17   Preoperative Diagnosis: Hypotension, shock    Postoperative Diagnosis: Same   Procedure(s) Performed: Left femoral arterial line placement    Surgeon: Lanell Matar. Constance Haw, MD   Assistants: None   Anesthesia: None   Complications: None    Indications: Mr. Cotham is a 76 y.o. with who underwent a Ex lap, right hemicolecotmy, SBR for ischemic small bowel related to is metastatic carcinoid tumor. He is currently in the ICU and on pressors. Attempts in the OR at arterial line was unsuccessful.   I discussed the risk and benefits of placement of the arterial line with the patient and family, including but not limited to bleeding, infection. The daughter gave written consent for the procedure.    Procedure: The patient was supine, intubated and sedated in the ICU. The left groin was prepped and draped using the standard sterile technique.  Wearing full gown and gloves, I performed the procedure.  The ultrasound was utilized to identify the femoral artery, and the needle with syringe was advanced into the femoral artery with bright red pulsating blood return. A wire was placed using the Seldinger technique without difficulty.  The skin was knicked and the arterial line was placed over the wire with continued control of the wire.  The wire was removed and there was a faint pulsatile bleed.  The arterial line was placed on the monitor and there was a good waveform. The catheter was secured in with 2-0 silk and a biopatch and dressing was placed.      The patient tolerated the procedure well. His pressures remained very low after arterial line placement. With BP in the 96-04V systolic. His levophed was titrated up and vasopresin was added.   Curlene Labrum, MD [facility] [address] [place], [place] [address] [phone] (office)

## 2017-08-18 NOTE — Progress Notes (Addendum)
[facility]  Discussed the patient's surgery and outcome extensively with the family. Discussed the small bowel resection/ right hemicolectomy, need for end jejunostomy (resected over 150cm of small bowel with colon).  Discussed the labile state of the patient during the procedure and the poor prognosis given the extent of resection.    Family has made the patient DNR post operative, keeping in line with his wishes.  Updated the Hospitalist and Intensivist regarding the patient.   Intensivist managing vent and pain.  NPO NG in place End ostomy  Labs ordered EKG ordered Femoral aline placed with low BP continuing.  Has received 3pRBC,1 FFP, and getting 1 platelet. Was overall very oozing in the surgery.  Levophed at 20, adding vasopresin.   Curlene Labrum, MD [facility] [address] [place], [place] [address] [phone] (office)

## 2017-08-18 NOTE — ED Notes (Addendum)
Dr Constance Haw at bedside. Gave order for NS @125  ml/hr and insert foley. Family at beside. Dr Constance Haw speaking with daughter via phone

## 2017-08-18 NOTE — ED Provider Notes (Signed)
[facility] EMERGENCY DEPARTMENT Provider Note   CSN: [id number] Arrival date & time: 08/18/17  9242     History   Chief Complaint Chief Complaint  Patient presents with  . Near Syncope    HPI Stephen [name] is a 76 y.o. male.  HPI Patient had a near syncopal event when he stood up this morning lasting approximately 5 seconds.  He is complained of generalized weakness and diminished appetite since discharge from here 08/10/2017.  Had diarrhea for a month which stopped 2 days ago after treatment with Imodium.  His last bowel movement was 2 days ago.  He denies any abdominal pain chest pain headache or fever.  No treatment prior to coming here.  Generalized weakness is worse with standing.  She also has right knee pain only with weightbearing or pressing on his right knee for the past 1 month.  He was treated for bursitis prior to his laparotomy 08/02/2017 for carcinoid tumor.  He received steroid injection with transient relief of right knee pain Past Medical History:  Diagnosis Date  . Anemia of chronic renal failure, stage 3 (moderate) (HCC)   . Atrial fibrillation ([place])    Documented 08/2016  . B12 deficiency 08/17/2016  . Cancer ([place])    carcinoma of bowelcancer  . Carotid artery occlusion    Right carotid endarterectomy 2001; left carotid endarterectomy in 2008  . Celiac disease   . Chronic renal disease, stage 3, moderately decreased glomerular filtration rate between 30-59 mL/min/1.73 square meter ([place]) 06/01/2012  . Coronary atherosclerosis of native coronary artery    PTCA of LAD in 1994; total obstruction of the RCA treated medically; subsequent stress nuclear study with inferior ischemia  . DDD (degenerative disc disease), lumbar    Lumbar surgery in 1984  . Diabetes mellitus type II   . Essential hypertension   . History of stroke    Right brain 2001; right PCA, MCA/PCA, left SCA infarcts 08/2017 with atrial fibrillation  . Hyperlipidemia   . Iron deficiency  anemia 12/31/2016  . Peripheral vascular disease [facility])     Patient Active Problem List   Diagnosis Date Noted  . Metastatic malignant carcinoid tumor to liver ([place]) 08/16/2017  . Gastric outlet obstruction   . Neoplasm of uncertain behavior of mesentery   . Preoperative cardiovascular examination   . Intractable nausea and vomiting 07/28/2017  . Bowel obstruction ([place]) 07/28/2017  . Acute on chronic renal failure ([place]) 07/28/2017  . Protein-calorie malnutrition, severe 07/06/2017  . Enteropathogenic Escherichia coli infection 07/06/2017  . Abnormal weight loss 07/06/2017  . Acute renal failure superimposed on stage 4 chronic kidney disease ([place]) 07/05/2017  . Permanent atrial fibrillation ([place]) 07/05/2017  . Diarrhea 07/05/2017  . Iron deficiency anemia 12/31/2016  . PAF (paroxysmal atrial fibrillation) ([place]) 12/14/2016  . Stroke (cerebrum) ([place]) 09/04/2016  . B12 deficiency 08/17/2016  . PAD (peripheral artery disease) ([place]) 01/21/2016  . Irregular heartbeat 07/19/2014  . Atherosclerotic PVD with intermittent claudication ([place]) 07/17/2014  . Carotid stenosis 07/12/2014  . Chronic diastolic heart failure ([place]) 09/27/2013  . Occlusion and stenosis of carotid artery without mention of cerebral infarction 06/28/2012  . Anemia of chronic renal failure, stage 3 (moderate) ([place]) 06/01/2012  . CKD (chronic kidney disease) stage 3, GFR 30-59 ml/min (HCC) 06/01/2012  . Diabetes mellitus type II   . Essential hypertension   . Hyperlipidemia   . Cerebrovascular disease   . Coronary artery disease involving native coronary artery of native heart with angina pectoris (  [place])   . Peripheral vascular disease ([place])     Past Surgical History:  Procedure Laterality Date  . APPENDECTOMY  1970  . BOWEL RESECTION  08/02/2017   Procedure: SMALL BOWEL RESECTION;  Surgeon: Virl Cagey, MD;  Location: [facility] ORS;  Service: General;;  . CAROTID ENDARTERECTOMY  2008   Left   . CAROTID  ENDARTERECTOMY  06/15/2000   Right  . COLONOSCOPY N/A 10/10/2015   Procedure: COLONOSCOPY;  Surgeon: Rogene Houston, MD;  Location: [facility] ENDO SUITE;  Service: Endoscopy;  Laterality: N/A;  12:25 - moved to 11:15 - Ann to notify  . GASTROJEJUNOSTOMY  08/02/2017   Procedure: GASTROJEJUNOSTOMY;  Surgeon: Virl Cagey, MD;  Location: [facility] ORS;  Service: General;;  . LIVER BIOPSY  08/02/2017   Procedure: LIVER BIOPSY;  Surgeon: Virl Cagey, MD;  Location: [facility] ORS;  Service: General;;  . LUMBAR [place] SURGERY  2000, 2001   L5 HNP required 2 surgical procedures       Home Medications    Prior to Admission medications   Medication Sig Start Date End Date Taking? Authorizing Provider  acetaminophen (TYLENOL) 500 MG tablet Take 500 mg by mouth every 6 (six) hours as needed.    [provider]  allopurinol (ZYLOPRIM) 100 MG tablet Take 200 mg by mouth daily.  12/16/16   [provider]  apixaban (ELIQUIS) 5 MG TABS tablet Take 1 tablet (5 mg total) by mouth 2 (two) times daily. 04/26/17   Rosalin Hawking, MD  aspirin 81 MG tablet Take 81 mg by mouth daily.    [provider]  atenolol (TENORMIN) 25 MG tablet Take 0.5 tablets (12.5 mg total) by mouth daily. 08/11/17 09/10/17  Murlean Iba, MD  Cholecalciferol (VITAMIN D-3) 1000 units CAPS Take 1 capsule by mouth daily.    [provider]  docusate sodium (COLACE) 100 MG capsule Take 1 capsule (100 mg total) by mouth daily. 08/10/17   Johnson, Clanford L, MD  feeding supplement, ENSURE ENLIVE, (ENSURE ENLIVE) LIQD Take 237 mLs by mouth 2 (two) times daily between meals. 07/09/17   Janece Canterbury, MD  furosemide (LASIX) 40 MG tablet Take 1 tablet (40 mg total) by mouth daily as needed for fluid or edema (or more than 2-lbs of weight gain). Patient taking differently: Take 60 mg daily by mouth.  07/09/17   Janece Canterbury, MD  insulin detemir (LEVEMIR) 100 UNIT/ML injection Inject 0.1 mLs (10 Units total)  into the skin at bedtime. 08/10/17   Johnson, Clanford L, MD  losartan (COZAAR) 25 MG tablet Take 1 tablet (25 mg total) by mouth daily. 08/11/17 09/10/17  Murlean Iba, MD  Melatonin 10 MG CAPS Take 10 mg by mouth at bedtime.    [provider]  Multiple Vitamin (MULTIVITAMIN WITH MINERALS) TABS tablet Take 1 tablet by mouth daily. 08/11/17   Johnson, Eldridge Dace, MD  OVER THE COUNTER MEDICATION OTC Sleepinal - patient takes 1 at bedtime.    [provider]  oxyCODONE-acetaminophen (PERCOCET/ROXICET) 5-325 MG tablet Take 1-2 tablets by mouth every 6 (six) hours as needed for severe pain. 08/16/17   Twana First, MD  pantoprazole (PROTONIX) 40 MG tablet Take 1 tablet (40 mg total) by mouth daily before breakfast. 07/22/17   Rehman, Mechele Dawley, MD  pravastatin (PRAVACHOL) 40 MG tablet Take 40 mg by mouth daily.    [provider]  Probiotic Product (PROBIOTIC DAILY PO) Take 1 tablet daily by mouth.  [provider]  prochlorperazine (COMPAZINE) 10 MG tablet Take 10 mg every 6 (six) hours as needed by mouth for nausea or vomiting.  07/28/17   [provider]  Psyllium (EQ DAILY FIBER PO) Take 1 capsule every morning by mouth. Per Patient's daughter states that he takes 1 a every morning.     [provider]  sodium bicarbonate 650 MG tablet Take 650 mg by mouth 3 (three) times daily.     [provider]  vitamin B-12 (CYANOCOBALAMIN) 1000 MCG tablet Take 1 tablet (1,000 mcg total) by mouth daily. Take 2 tabs by mouth daily. 07/09/17   Janece Canterbury, MD    Family History Family History  Problem Relation Age of Onset  . Heart disease Mother        Before age 76  . Diabetes Mother   . Heart attack Mother   . Diabetes Sister   . Heart disease Brother   . Diabetes Brother   . Diabetes Daughter   . Diabetes Son   . Hyperlipidemia Son     Social History Social History   Tobacco Use  . Smoking status: Former Smoker     Packs/day: 3.00    Years: 10.00    Pack years: 30.00    Types: Cigarettes    Last attempt to quit: 09/21/1972    Years since quitting: 44.9  . Smokeless tobacco: Never Used  Substance Use Topics  . Alcohol use: No    Alcohol/week: 0.0 oz  . Drug use: No     Allergies   Patient has no known allergies.   Review of Systems Review of Systems  Constitutional: Positive for appetite change.  HENT: Negative.   Respiratory: Negative.   Cardiovascular: Negative.   Gastrointestinal: Positive for diarrhea.  Musculoskeletal: Positive for arthralgias.       Right knee pain  Skin: Negative.   Allergic/Immunologic: Positive for immunocompromised state.       Cancer pt  Neurological: Positive for weakness.       Generralized weakness  Psychiatric/Behavioral: Negative.   All other systems reviewed and are negative.    Physical Exam Updated Vital Signs BP (!) 93/59 (BP Location: Left Arm)   Pulse 88   Temp 97.7 F (36.5 C) (Oral)   Resp 15   Ht 6' (1.829 m)   Wt 83.9 kg (185 lb)   SpO2 95%   BMI 25.09 kg/m   Physical Exam  Constitutional: He is oriented to person, place, and time. No distress.  Chronically ill-appearing  HENT:  Head: Normocephalic and atraumatic.  Eyes: Conjunctivae are normal. Pupils are equal, round, and reactive to light.  Neck: Neck supple. No tracheal deviation present. No thyromegaly present.  Cardiovascular: Normal rate and regular rhythm.  No murmur heard. Pulmonary/Chest: Effort normal and breath sounds normal.  Abdominal: Soft. Bowel sounds are normal. He exhibits no distension. There is no tenderness.  Well-healing midline stapled surgical wound.  Ecchymoses at lower abdomen  Musculoskeletal: Normal range of motion. He exhibits no edema or tenderness.  Trace pretibial pitting edema bilaterally.  Right lower extremity mildly swollen and tender over anterior knee not red or hot at all 4 extremities neurovascularly intact  Neurological: He is alert  and oriented to person, place, and time. No cranial nerve deficit. Coordination normal.  Skin: Skin is warm and dry. No rash noted.  Psychiatric: He has a normal mood and affect.  Nursing note and vitals reviewed.    ED Treatments / Results  Labs (all labs ordered are listed, but only abnormal results are displayed) Labs Reviewed - No data to display  EKG  EKG Interpretation  Date/Time:  Wednesday August 18 2017 10:03:14 EST Ventricular Rate:  80 PR Interval:    QRS Duration: 108 QT Interval:  353 QTC Calculation: 408 R Axis:   39 Text Interpretation:  Normal sinus rhythm Minimal ST depression, diffuse leads No significant change since last tracing Confirmed by Orlie Dakin [phone]) on 08/18/2017 10:24:01 AM       Radiology No results found.  Procedures Procedures (including critical care time)  Medications Ordered in ED Medications - No data to display  X-rays and CT scan viewed by me Results for orders placed or performed during the hospital encounter of 08/18/17  Comprehensive metabolic panel  Result Value Ref Range   Sodium 133 (L) 135 - 145 mmol/L   Potassium 4.4 3.5 - 5.1 mmol/L   Chloride 103 101 - 111 mmol/L   CO2 22 22 - 32 mmol/L   Glucose, Bld 135 (H) 65 - 99 mg/dL   BUN 94 (H) 6 - 20 mg/dL   Creatinine, Ser 2.97 (H) 0.61 - 1.24 mg/dL   Calcium 8.3 (L) 8.9 - 10.3 mg/dL   Total Protein 6.0 (L) 6.5 - 8.1 g/dL   Albumin 2.8 (L) 3.5 - 5.0 g/dL   AST 12 (L) 15 - 41 U/L   ALT 9 (L) 17 - 63 U/L   Alkaline Phosphatase 65 38 - 126 U/L   Total Bilirubin 0.6 0.3 - 1.2 mg/dL   GFR calc non Af Amer 19 (L) >60 mL/min   GFR calc Af Amer 22 (L) >60 mL/min   Anion gap 8 5 - 15  CBC with Differential/Platelet  Result Value Ref Range   WBC 8.5 4.0 - 10.5 K/uL   RBC 2.93 (L) 4.22 - 5.81 MIL/uL   Hemoglobin 8.8 (L) 13.0 - 17.0 g/dL   HCT 28.6 (L) 39.0 - 52.0 %   MCV 97.6 78.0 - 100.0 fL   MCH 30.0 26.0 - 34.0 pg   MCHC 30.8 30.0 - 36.0 g/dL   RDW 18.6 (H)  11.5 - 15.5 %   Platelets 224 150 - 400 K/uL   Neutrophils Relative % 87 %   Neutro Abs 7.4 1.7 - 7.7 K/uL   Lymphocytes Relative 6 %   Lymphs Abs 0.5 (L) 0.7 - 4.0 K/uL   Monocytes Relative 6 %   Monocytes Absolute 0.5 0.1 - 1.0 K/uL   Eosinophils Relative 1 %   Eosinophils Absolute 0.1 0.0 - 0.7 K/uL   Basophils Relative 0 %   Basophils Absolute 0.0 0.0 - 0.1 K/uL   Ct Abdomen Pelvis Wo Contrast  Result Date: 08/18/2017 CLINICAL DATA:  Weakness and diarrhea since May 2018, metastatic malignant carcinoid tumor, post gastrojejunostomy and partial small-bowel resection on 08/02/2017, near syncope today ; history coronary artery disease, type II diabetes mellitus, essential hypertension, peripheral vascular disease EXAM: CT ABDOMEN AND PELVIS WITHOUT CONTRAST TECHNIQUE: Multidetector CT imaging of the abdomen and pelvis was performed following the standard protocol without IV contrast. Sagittal and coronal MPR images reconstructed from axial data set. COMPARISON:  07/28/2017 FINDINGS: Lower chest: Mild atelectasis at lung bases. Tiny nodular foci at lung bases unchanged. Hepatobiliary: Liver unremarkable.  Gallbladder unremarkable. Pancreas: Normal appearance Spleen: Normal appearance Adrenals/Urinary Tract: Adrenal glands normal appearance. BILATERAL nonobstructing renal calculi largest LEFT kidney 12 mm diameter image 46. No definite hydronephrosis or hydroureter. Bladder unremarkable. Stomach/Bowel: Prior gastrojejunostomy.  Stomach incompletely distended, limiting assessment of wall thickness but appears minimally thickened. Diffuse thickening of the proximal jejunum as well as additional scattered bowel loops, could be postsurgical though wall thickening can also be seen with infection and ischemia. Significant amount of associated free intraperitoneal air and fluid is atypical for 16 days post abdominal surgery and consistent with perforated viscus. A discrete anastomotic leak is not seen. Source  of perforation is not localized. Colon decompressed without wall thickening. Vascular/Lymphatic: Extensive atherosclerotic calcifications of aorta, abdominal branch vessels common iliac arteries and femoral arteries. Coronary retro calcifications noted. Reproductive: Few prostatic calcifications, nonspecific Other: Tiny umbilical hernia. Significant free intraperitoneal air and free fluid as above. Scattered edema of intra-abdominal and subcutaneous tissue planes. Scattered mesenteric edema with again identified calcified mesenteric mass within the small bowel mesenteries compatible with known carcinoid tumor. Additional partially calcified soft tissue density is seen LEFT para-aortic which may represent either adenopathy or direct tumor extension, Musculoskeletal: Osseous demineralization with degenerative changes of the lumbar spine. IMPRESSION: Extensive free air in free fluid, atypical for 16 days post abdominal surgery compatible with a perforated viscus. Site of perforation is uncertain, without discrete localization of a bowel perforation or anastomotic leak by CT. Bowel wall thickening at the gastrojejunostomy anastomosis but as well throughout multiple small bowel loops in the upper abdomen and pelvis. In light of the patient's extensive atherosclerotic disease and identification of ischemic bowel at surgery, findings are concerning for small bowel ischemia though wall thickening can also be seen with prolonged handling of bowel at surgery, infection, and inflammatory bowel disease. Known partially calcified mass within the mesenteric compatible with carcinoid tumor with tumor extension versus adenopathy LEFT para-aortic. Critical Value/emergent results were called by telephone at the time of interpretation on 08/18/2017 at 2:47 pm to Dr. Curlene Labrum, who verbally acknowledged these results. Electronically Signed   By: Lavonia Dana M.D.   On: 08/18/2017 14:49   Ct Abdomen Pelvis Wo Contrast  Result  Date: 07/28/2017 CLINICAL DATA:  Diarrhea for months. Sudden severe nausea and vomiting all day today. Abdominal pain. EXAM: CT ABDOMEN AND PELVIS WITHOUT CONTRAST TECHNIQUE: Multidetector CT imaging of the abdomen and pelvis was performed following the standard protocol without IV contrast. COMPARISON:  11/18/2006 FINDINGS: Lower chest: 4 mm subpleural nodule in the right lung base. 4 mm subpleural nodule in the left lung base. These nodules were not seen previously. Several additional smaller nodules are demonstrated. Hepatobiliary: No focal liver lesions are identified. The gallbladder is distended but there is no wall thickening or inflammatory change. No radiopaque stones are demonstrated. Pancreas: Unremarkable. No pancreatic ductal dilatation or surrounding inflammatory changes. Spleen: Normal in size without focal abnormality. Adrenals/Urinary Tract: No adrenal gland nodules. Bilateral intrarenal stones. Largest is in the left lower pole measuring 12 mm in diameter. No hydronephrosis or hydroureter. No ureteral stones are demonstrated. Bladder wall is not thickened. No bladder filling defects. Stomach/Bowel: The stomach is markedly distended, mostly with fluid. No gastric wall thickening is identified. The proximal duodenum is distended. Transition zone appears to be at the second portion of the duodenum as it crosses beneath the superior mesenteric artery. Remainder of the small bowel is decompressed. Scattered stool in the colon. No colonic distention or wall thickening. Appendix is not identified. Vascular/Lymphatic: Calcification of the abdominal aorta. No aneurysm. There is retroperitoneal and periaortic lymphadenopathy with lymph nodes measuring up to about 15 mm diameter. Some of the lymph nodes are calcified. Prominent celiac axis and mesenteric lymphadenopathy. There is  a partially calcified mass centrally in the mesentery and surrounding the superior mesenteric artery. The mass measures about 3.3 x  5.5 cm in diameter. Desmoplastic response with radiating scars into the mesentery. Reproductive: Prostate gland is not enlarged. Prostate calcifications are present. Other: No free air in the abdomen. Small amount of free fluid around the liver edge. Abdominal wall musculature appears intact. Musculoskeletal: Degenerative changes in the spine. No destructive bone lesions. IMPRESSION: 1. Large partially calcified mass in the root of the mesentery with radiating scarring. Additional calcified and noncalcified lymphadenopathy in the retroperitoneum and celiac axis. Differential diagnosis would include carcinoid tumor, sclerosing mesenteritis, or possibly lymphoma. 2. Marked distention of the stomach and proximal duodenum with transition zone at the second- third portion of the duodenum. Obstruction is likely caused by the mesenteric process described above. 3. Distended gallbladder without evidence of cholelithiasis. 4. Multiple bilateral nonobstructing intrarenal stones. 5. Small amount of free fluid around the liver edge. 6. Aortic calcifications. 7. Small scattered nodules in the lung bases, largest measuring 4 mm. No follow-up needed if patient is low-risk (and has no known or suspected primary neoplasm). Non-contrast chest CT can be considered in 12 months if patient is high-risk. This recommendation follows the consensus statement: Guidelines for Management of Incidental Pulmonary Nodules Detected on CT Images: From the Fleischner Society 2017; Radiology 2017; 284:228-243. Electronically Signed   By: Lucienne Capers M.D.   On: 07/28/2017 21:27   Dg Abd 1 View  Result Date: 08/07/2017 CLINICAL DATA:  History of bypass gastrojejunostomy. Evaluate for a air or contrast moving into small bowel EXAM: ABDOMEN - 1 VIEW COMPARISON:  Upper GI 08/06/2017 FINDINGS: Unable to assess for patency of the gastrojejunostomy. NG tube tip is present in the mid stomach. Nonobstructive bowel gas pattern noted. No free air  organomegaly. Left lower pole nephrolithiasis noted. IMPRESSION: Nonobstructive bowel gas pattern. NG tube tip remains in the mid stomach. Cannot determine patency of gastrojejunostomy on this plain film study. Electronically Signed   By: Rolm Baptise M.D.   On: 08/07/2017 07:56   Dg Abd 1 View  Result Date: 08/02/2017 CLINICAL DATA:  76 year old male status post NG tube placement. Partially calcified mesenteric mass. EXAM: ABDOMEN - 1 VIEW COMPARISON:  CT Abdomen and Pelvis 07/28/2017 FINDINGS: Portable AP supine view at 0915 hours. Enteric tube courses to the abdomen and side hole is at the level of the proximal gastric body (arrow). Paucity bowel gas otherwise. Coarse calcifications in the left abdomen correspond to nephrolithiasis. Coarse calcifications in the right upper quadrant correspond a right upper pole nephrolithiasis. Negative lung bases. Endplate degeneration in the spine. No acute osseous abnormality identified. IMPRESSION: 1. NG tube side hole at the level of the proximal gastric body. 2. Nephrolithiasis.  Paucity of bowel gas. Electronically Signed   By: Genevie Ann M.D.   On: 08/02/2017 09:38   US Renal  Result Date: 08/05/2017 CLINICAL DATA:  Acute renal disease superimposed on chronic renal disease. EXAM: RENAL / URINARY TRACT ULTRASOUND COMPLETE COMPARISON:  Abdominal series 11 08/2017. CT 07/28/2017. Ultrasound 03/02/2017 . FINDINGS: Right Kidney: Length: 10.5 cm. Echogenicity within normal limits. Cortical irregularity suggesting scarring . No mass or hydronephrosis visualized. Left Kidney: Length: 11.4 cm. Echogenicity within normal limits. Cortical irregularity suggesting scarring . No mass or hydronephrosis visualized. 13 mm nonobstructing stone. Bladder: A Foley catheter noted.  Bladder is nondistended. Incidental note is made of increased hepatic echogenicity consistent fatty infiltration and/or hepatocellular disease. Mild ascites incidentally noted. IMPRESSION: 1.  Bilateral renal  cortical irregularity suggesting scarring. 2. 13 mm nonobstructing left renal stone. No acute renal abnormality identified. No hydronephrosis. 3. Incidental note is made of increased hepatic echogenicity consistent fatty infiltration and/or hepatocellular disease. Mild ascites is incidentally noted . Electronically Signed   By: Marcello Moores  Register   On: 08/05/2017 12:58   Dg Chest Portable 1 View  Result Date: 07/28/2017 CLINICAL DATA:  NG tube placement EXAM: PORTABLE CHEST 1 VIEW COMPARISON:  07/09/2015 FINDINGS: Minimal bibasilar atelectasis. Cardiomediastinal silhouette within normal limits. Aortic atherosclerosis. No pneumothorax. Esophageal tube courses toward the diaphragm but the tip is poorly visible, possibly over the GE junction. Gaseous enlargement of the stomach IMPRESSION: 1. Minimal bibasilar atelectasis 2. Poor visibility of the tip of the esophageal tube, it possibly projects over the distal esophagus. The stomach appears enlarged. Electronically Signed   By: Donavan Foil M.D.   On: 07/28/2017 23:20   Dg Knee Right Port  Result Date: 07/31/2017 CLINICAL DATA:  Pain and swelling for several days EXAM: PORTABLE RIGHT KNEE - 1-2 VIEW COMPARISON:  None. FINDINGS: no acute displaced fracture or malalignment is seen. Mild patellofemoral degenerative changes. Joint space calcifications. Prepatellar calcifications and oval soft tissue thickening suprapatellar superficial soft tissues. Vascular calcification IMPRESSION: 1. No acute fracture or malalignment 2. Mild degenerative changes.  Chondrocalcinosis 3. Soft tissue thickening and calcifications anterior to the patella suggesting prepatellar bursitis. Electronically Signed   By: Donavan Foil M.D.   On: 07/31/2017 17:59   Dg Abd 2 Views  Result Date: 08/05/2017 CLINICAL DATA:  Abdomen pain with emesis and diarrhea, status post laparotomy 08/02/2017 EXAM: ABDOMEN - 2 VIEW COMPARISON:  07/28/2017, 08/02/2017 FINDINGS: Supine and upright views of  the abdomen demonstrate small to moderate free air beneath the right greater than left diaphragm. Esophageal tube tip overlies the proximal stomach. Midline cutaneous staples. Mild gaseous enlargement of small bowel up to 3.8 cm in the right mid abdomen. Paucity of distal gas. Scattered fluid levels. Calcifications overlying the left kidney. IMPRESSION: 1. Small moderate pneumoperitoneum, probably related to recent laparotomy ; radiographic follow-up may be performed to insure that this is decreasing as opposed increasing given the quantity under the right hemidiaphragm. 2. Mild gaseous enlargement of right lower quadrant small bowel with fluid levels suspicious for a mild ileus. 3. Left kidney stones Electronically Signed   By: Donavan Foil M.D.   On: 08/05/2017 15:08   Dg Abd Acute W/chest  Result Date: 08/18/2017 CLINICAL DATA:  Diarrhea, weakness. EXAM: DG ABDOMEN ACUTE W/ 1V CHEST COMPARISON:  Radiographs of August 07, 2017. CT scan of July 28, 2017. FINDINGS: No abnormal bowel gas pattern is noted. Left nephrolithiasis is noted. Phleboliths are noted in the pelvis. Midline surgical staples are noted pneumoperitoneum is noted under both hemidiaphragms which may be due to patient's postoperative status. Heart size and mediastinal contours are within normal limits. Both lungs are clear. IMPRESSION: No evidence of bowel obstruction or ileus. No acute cardiopulmonary disease. Left nephrolithiasis is noted. Pneumoperitoneum is noted under both hemidiaphragms which may be due to patient's postoperative status, although according to ordering physician, this procedure was 15 days ago. The amount of air that remain is concerning for possible rupture of hollow viscus. Unenhanced CT scan of the abdomen pelvis may be performed for further evaluation. Critical Value/emergent results were called by telephone at the time of interpretation on 08/18/2017 at 12:31 pm to Dr. Orlie Dakin , who verbally acknowledged  these results. Electronically Signed   By: Jeneen Rinks  Murlean Caller, M.D.   On: 08/18/2017 12:31   Dg Duanne Limerick  W/kub  Result Date: 08/06/2017 CLINICAL DATA:  Recent bypass gastric jejunostomy. 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 Evaluate for leak EXAM: WATER SOLUBLE UPPER GI SERIES TECHNIQUE: Single-column upper GI series was performed using water soluble contrast. CONTRAST:  350 mL p.O. <See Chart> ISOVUE-300 IOPAMIDOL (ISOVUE-300) INJECTION 61% COMPARISON:  CT 07/28/2017 FLUOROSCOPY TIME:  Fluoroscopy Time:  3 minutes 18 seconds Radiation Exposure Index (if provided by the fluoroscopic device): Number of Acquired Spot Images: 0 FINDINGS: Isovue contrast was injected through the NG tube to fill the stomach. Stomach is normal in size and contour. Stomach empties into the duodenum. The duodenum appears obstructed in the third portion as noted on prior CT due to tumor in the mesenteric. No filling was seen in the gastrojejunostomy. Patient was positioned right lateral, left lateral, and supine, and with delayed imaging there was no filling of the gastrojejunostomy. On the final AP image, there appears to be some edema along the greater curve of the stomach which may be the anastomosis however no significant contrast is noted in the gastric jejunostomy. This could be positional or due to edema from recent surgery. Negative for extravasation of contrast.  No leak. IMPRESSION: The stomach was filled with contrast. There is obstruction of the third portion the duodenum due to tumor. No filling of the gastric jejunostomy. This could be due to edema at the anastomosis. Negative for leak. Electronically Signed   By: Franchot Gallo M.D.   On: 08/06/2017 10:48   Initial Impression / Assessment and Plan / ED Course  I have reviewed the triage vital signs and the nursing notes.  Pertinent labs & imaging results that were available during my care of the patient were reviewed by me and considered in my medical decision making  (see chart for details).     Dr. Constance Haw from general surgery was consulted on the case and saw patient in the emergency department.  I called by [facility] radiology who states that patient has perforated viscus and pneumoperitoneum. At 2:30 PM patient remains comfortable.  He denies any pain after treatment with 2 L normal saline bolus and IV thiamine. Patient will be taken to the operating room renal function consistent with acute on chronic renal insufficiency Final Clinical Impressions(s) / ED Diagnoses  Diagnoses #1 near syncope #2 pneumoperitoneum #3 acute on chronic renal insufficiency Final diagnoses:  None  CRITICAL CARE Performed by: Orlie Dakin Total critical care time: 30 minutes Critical care time was exclusive of separately billable procedures and treating other patients. Critical care was necessary to treat or prevent imminent or life-threatening deterioration. Critical care was time spent personally by me on the following activities: development of treatment plan with patient and/or surrogate as well as nursing, discussions with consultants, evaluation of patient's response to treatment, examination of patient, obtaining history from patient or surrogate, ordering and performing treatments and interventions, ordering and review of laboratory studies, ordering and review of radiographic studies, pulse oximetry and re-evaluation of patient's condition.  ED Discharge Orders    None       Orlie Dakin, MD 08/18/17 [phone]

## 2017-08-18 NOTE — Anesthesia Postprocedure Evaluation (Signed)
Anesthesia Post Note  Patient: Stephen [name] [facility]  Procedure(s) Performed: EXPLORATORY LAPAROTOMY WITH HEMI COLLECTOMY (Right ) SMALL BOWEL RESECTION (Abdomen) OSTOMY  Patient location during evaluation: ICU Anesthesia Type: General Level of consciousness: patient remains intubated per anesthesia plan Vital Signs Assessment: vitals unstable Respiratory status: patient on ventilator - see flowsheet for VS Cardiovascular status: blood pressure returned to baseline Anesthetic complications: no     Last Vitals:  Vitals:   08/18/17 2025 08/18/17 2037  BP:    Pulse:    Resp:    Temp:  36.4 C  SpO2: 100%     Last Pain:  Vitals:   08/18/17 2037  TempSrc: Axillary  PainSc:                  Bradlee Bridgers A

## 2017-08-18 NOTE — Progress Notes (Addendum)
[facility] Night Note  Reviewed notes and orders. Will get blood bank to have 2u Prbcs on hold. AM labs moved up to [place] 11:08 PM  Addendum  Saw pt at bedside. Spoke to family and got update from nursing. BP at bedside on A-line better than what is currently in chart. Mild trop bump is expected. Blood products being infused. H/H remains stable. Will f/u labs.  Elwin Mocha 12:21 AM   Vitals better in chart. Notes reviewed. Ordered f/u H/H and Trop.  Elwin Mocha 6:35 AM

## 2017-08-18 NOTE — Transfer of Care (Signed)
Immediate Anesthesia Transfer of Care Note Late Entry for 2017  Patient: Stephen [name] [facility]  Procedure(s) Performed: EXPLORATORY LAPAROTOMY WITH HEMI COLLECTOMY (Right ) SMALL BOWEL RESECTION (Abdomen) OSTOMY  Patient Location: ICU  Anesthesia Type:General  Level of Consciousness: sedated and Patient remains intubated per anesthesia plan  Airway & Oxygen Therapy: Patient remains intubated per anesthesia plan  Post-op Assessment: Report given to RN  Post vital signs: reviewed and remains unstable as he was intraop; Dr. Constance Haw and Dr. Patsey Berthold aware  Last Vitals:  Vitals:   08/18/17 2025 08/18/17 2037  BP:    Pulse:    Resp:    Temp:  36.4 C  SpO2: 100%     Last Pain:  Vitals:   08/18/17 2037  TempSrc: Axillary  PainSc:       Patients Stated Pain Goal: 5 (19/75/88 3254)  Complications: No apparent anesthesia complications  Patient still requiring Levophed for BP support; Remains intubated

## 2017-08-18 NOTE — ED Notes (Signed)
Hospitalist at bedside 

## 2017-08-18 NOTE — H&P (Signed)
History and Physical    Stephen [name]:[id number] DOB: 1940-12-01 DOA: 08/18/2017  PCP: [name], MD  Patient coming from: Home  Chief Complaint: Abdominal Pain  HPI: Stephen [name] is a 76 y.o. male with medical history significant of metastici carcinoid tumor who recently underwent gastrojejunostomy and small bowel resection 16 days ago, CKF Stage III, Atrial fibrillation on eliquis presented to the ED with syncope.  Per Dr. Constance Haw' History and physical patient voiced he was having slightly decreased PO intake and diarrhea.  He underwent CT scan of the abdomen and began having abdominal pain.  No further history obtainable as patient in significant pain and prepping for surgery.  ED Course: Patient found to be hypotensive- received 2L boluses.  General surgeon Dr. Constance Haw evaluated after CT scan results showing free air, bowel wall thickening and concern for ischemia. Of note his labs show WBC of 8.5, H/H 8.8/28.6, Plt of 224.  Creatinine of 2.97 (baseline appears to be between 2.2-2.3). Patient to be admitted to ICU and to undergo surgery emergently.  Review of Systems: As per HPI otherwise 10 point review of systems negative.    Past Medical History:  Diagnosis Date  . Anemia of chronic renal failure, stage 3 (moderate) (HCC)   . Atrial fibrillation ([place])    Documented 08/2016  . B12 deficiency 08/17/2016  . Cancer ([place])    carcinoma of bowelcancer  . Carotid artery occlusion    Right carotid endarterectomy 2001; left carotid endarterectomy in 2008  . Celiac disease   . Chronic renal disease, stage 3, moderately decreased glomerular filtration rate between 30-59 mL/min/1.73 square meter ([place]) 06/01/2012  . Coronary atherosclerosis of native coronary artery    PTCA of LAD in 1994; total obstruction of the RCA treated medically; subsequent stress nuclear study with inferior ischemia  . DDD (degenerative disc disease), lumbar    Lumbar surgery in 1984  . Diabetes mellitus  type II   . Essential hypertension   . History of stroke    Right brain 2001; right PCA, MCA/PCA, left SCA infarcts 08/2017 with atrial fibrillation  . Hyperlipidemia   . Iron deficiency anemia 12/31/2016  . Peripheral vascular disease [facility])     Past Surgical History:  Procedure Laterality Date  . APPENDECTOMY  1970  . BOWEL RESECTION  08/02/2017   Procedure: SMALL BOWEL RESECTION;  Surgeon: Virl Cagey, MD;  Location: [facility] ORS;  Service: General;;  . CAROTID ENDARTERECTOMY  2008   Left   . CAROTID ENDARTERECTOMY  06/15/2000   Right  . COLONOSCOPY N/A 10/10/2015   Procedure: COLONOSCOPY;  Surgeon: Rogene Houston, MD;  Location: [facility] ENDO SUITE;  Service: Endoscopy;  Laterality: N/A;  12:25 - moved to 11:15 - Ann to notify  . GASTROJEJUNOSTOMY  08/02/2017   Procedure: GASTROJEJUNOSTOMY;  Surgeon: Virl Cagey, MD;  Location: [facility] ORS;  Service: General;;  . LIVER BIOPSY  08/02/2017   Procedure: LIVER BIOPSY;  Surgeon: Virl Cagey, MD;  Location: [facility] ORS;  Service: General;;  . LUMBAR [place] SURGERY  2000, 2001   L5 HNP required 2 surgical procedures     reports that he quit smoking about 44 years ago. His smoking use included cigarettes. He has a 30.00 pack-year smoking history. he has never used smokeless tobacco. He reports that he does not drink alcohol or use drugs.  No Known Allergies  Family History  Problem Relation Age of Onset  . Heart disease Mother  Before age 41  . Diabetes Mother   . Heart attack Mother   . Diabetes Sister   . Heart disease Brother   . Diabetes Brother   . Diabetes Daughter   . Diabetes Son   . Hyperlipidemia Son      Prior to Admission medications   Medication Sig Start Date End Date Taking? Authorizing Provider  allopurinol (ZYLOPRIM) 100 MG tablet Take 200 mg by mouth daily.  12/16/16  Yes [provider]  apixaban (ELIQUIS) 5 MG TABS tablet Take 1 tablet (5 mg total) by mouth 2 (two) times daily. 04/26/17  Yes  Rosalin Hawking, MD  aspirin 81 MG tablet Take 81 mg by mouth daily.   Yes [provider]  atenolol (TENORMIN) 25 MG tablet Take 0.5 tablets (12.5 mg total) by mouth daily. 08/11/17 09/10/17 Yes Johnson, Clanford L, MD  Cholecalciferol (VITAMIN D-3) 1000 units CAPS Take 1 capsule by mouth daily.   Yes [provider]  furosemide (LASIX) 40 MG tablet Take 1 tablet (40 mg total) by mouth daily as needed for fluid or edema (or more than 2-lbs of weight gain). Patient taking differently: Take 60 mg daily by mouth.  07/09/17  Yes Short, Noah Delaine, MD  insulin detemir (LEVEMIR) 100 UNIT/ML injection Inject 0.1 mLs (10 Units total) into the skin at bedtime. 08/10/17  Yes Johnson, Clanford L, MD  losartan (COZAAR) 25 MG tablet Take 1 tablet (25 mg total) by mouth daily. 08/11/17 09/10/17 Yes Johnson, Clanford L, MD  Melatonin 10 MG CAPS Take 10 mg by mouth at bedtime.   Yes [provider]  Multiple Vitamin (MULTIVITAMIN WITH MINERALS) TABS tablet Take 1 tablet by mouth daily. 08/11/17  Yes Johnson, Clanford L, MD  OVER THE COUNTER MEDICATION OTC Sleepinal - patient takes 1 at bedtime.   Yes [provider]  oxyCODONE-acetaminophen (PERCOCET/ROXICET) 5-325 MG tablet Take 1-2 tablets by mouth every 6 (six) hours as needed for severe pain. 08/16/17  Yes Twana First, MD  pantoprazole (PROTONIX) 40 MG tablet Take 1 tablet (40 mg total) by mouth daily before breakfast. 07/22/17  Yes Rehman, Mechele Dawley, MD  pravastatin (PRAVACHOL) 40 MG tablet Take 40 mg by mouth daily.   Yes [provider]  Probiotic Product (PROBIOTIC DAILY PO) Take 1 tablet daily by mouth.    Yes [provider]  Psyllium (EQ DAILY FIBER PO) Take 1 capsule every morning by mouth. Per Patient's daughter states that he takes 1 a every morning.    Yes [provider]  sodium bicarbonate 650 MG tablet Take 650 mg by mouth 3 (three) times daily.    Yes [provider]  vitamin B-12  (CYANOCOBALAMIN) 1000 MCG tablet Take 1 tablet (1,000 mcg total) by mouth daily. Take 2 tabs by mouth daily. 07/09/17  Yes Short, Noah Delaine, MD  acetaminophen (TYLENOL) 500 MG tablet Take 500 mg by mouth every 6 (six) hours as needed.    [provider]  docusate sodium (COLACE) 100 MG capsule Take 1 capsule (100 mg total) by mouth daily. 08/10/17   Johnson, Clanford L, MD  feeding supplement, ENSURE ENLIVE, (ENSURE ENLIVE) LIQD Take 237 mLs by mouth 2 (two) times daily between meals. 07/09/17   Janece Canterbury, MD  prochlorperazine (COMPAZINE) 10 MG tablet Take 10 mg every 6 (six) hours as needed by mouth for nausea or vomiting.  07/28/17   [provider]    Physical Exam: Vitals:   08/18/17 1417 08/18/17 1430 08/18/17 1459 08/18/17 1527  BP:  (!) 88/50 (!) 88/49 (!) 100/57  Pulse:  67 79 93  Resp: 16 (!) 21 (!) 90   Temp:   97.8 F (36.6 C) (!) 97.4 F (36.3 C)  TempSrc:   Oral Oral  SpO2:  98% 98%   Weight:    83.9 kg (185 lb)  Height:    6' (1.829 m)      Constitutional: moderate distress- crying throughout exam Vitals:   08/18/17 1417 08/18/17 1430 08/18/17 1459 08/18/17 1527  BP:  (!) 88/50 (!) 88/49 (!) 100/57  Pulse:  67 79 93  Resp: 16 (!) 21 (!) 90   Temp:   97.8 F (36.6 C) (!) 97.4 F (36.3 C)  TempSrc:   Oral Oral  SpO2:  98% 98%   Weight:    83.9 kg (185 lb)  Height:    6' (1.829 m)   Eyes: PERRL, lids and conjunctivae normal ENMT: Mucous membranes are moist. Posterior pharynx clear of any exudate or lesions.Normal dentition.  Neck: normal, supple, no masses, no thyromegaly Respiratory: clear to auscultation bilaterally, no wheezing, no crackles. Normal respiratory effort. No accessory muscle use.  Cardiovascular: Regular rate and rhythm, no murmurs / rubs / gallops. 3+ pitting edema to the groin bilaterally. 2+ pedal pulses. No carotid bruits.  Abdomen: tender in the LLQ, recent surgical site intact with staples with no appreciable  drainage Musculoskeletal: no clubbing / cyanosis. No joint deformity upper and lower extremities. Good ROM, no contractures. Normal muscle tone.  Skin: scattered ecchymoses Neurologic: CN 2-12 grossly intact unable to perform full neuro exam 2/2 prep for surgery  Psychiatric: Normal judgment and insight. Alert and oriented x 3. Normal mood.    Labs on Admission: I have personally reviewed following labs and imaging studies  CBC: Recent Labs  Lab 08/16/17 0824 08/18/17 1131  WBC 9.3 8.5  NEUTROABS 6.3 7.4  HGB 9.3* 8.8*  HCT 30.5* 28.6*  MCV 97.8 97.6  PLT 263 449   Basic Metabolic Panel: Recent Labs  Lab 08/18/17 1131  NA 133*  K 4.4  CL 103  CO2 22  GLUCOSE 135*  BUN 94*  CREATININE 2.97*  CALCIUM 8.3*   GFR: Estimated Creatinine Clearance: 23.2 mL/min (A) (by C-G formula based on SCr of 2.97 mg/dL (H)). Liver Function Tests: Recent Labs  Lab 08/18/17 1131  AST 12*  ALT 9*  ALKPHOS 65  BILITOT 0.6  PROT 6.0*  ALBUMIN 2.8*   No results for input(s): LIPASE, AMYLASE in the last 168 hours. No results for input(s): AMMONIA in the last 168 hours. Coagulation Profile: No results for input(s): INR, PROTIME in the last 168 hours. Cardiac Enzymes: No results for input(s): CKTOTAL, CKMB, CKMBINDEX, TROPONINI in the last 168 hours. BNP (last 3 results) No results for input(s): PROBNP in the last 8760 hours. HbA1C: No results for input(s): HGBA1C in the last 72 hours. CBG: Recent Labs  Lab 08/18/17 1532  GLUCAP 121*   Lipid Profile: No results for input(s): CHOL, HDL, LDLCALC, TRIG, CHOLHDL, LDLDIRECT in the last 72 hours. Thyroid Function Tests: No results for input(s): TSH, T4TOTAL, FREET4, T3FREE, THYROIDAB in the last 72 hours. Anemia Panel: No results for input(s): VITAMINB12, FOLATE, FERRITIN, TIBC, IRON, RETICCTPCT in the last 72 hours. Urine analysis:    Component Value Date/Time   COLORURINE YELLOW 07/28/2017 1511   APPEARANCEUR HAZY (A)  07/28/2017 1511   LABSPEC 1.017 07/28/2017 1511   PHURINE 5.0 07/28/2017 1511   GLUCOSEU NEGATIVE 07/28/2017 1511  HGBUR MODERATE (A) 07/28/2017 1511   BILIRUBINUR NEGATIVE 07/28/2017 1511   KETONESUR NEGATIVE 07/28/2017 1511   PROTEINUR NEGATIVE 07/28/2017 1511   UROBILINOGEN 0.2 07/05/2008 0922   NITRITE NEGATIVE 07/28/2017 1511   LEUKOCYTESUR SMALL (A) 07/28/2017 1511   Sepsis Labs: !!!!!!!!!!!!!!!!!!!!!!!!!!!!!!!!!!!!!!!!!!!! @LABRCNTIP (procalcitonin:4,lacticidven:4) )No results found for this or any previous visit (from the past 240 hour(s)).   Radiological Exams on Admission: Ct Abdomen Pelvis Wo Contrast  Result Date: 08/18/2017 CLINICAL DATA:  Weakness and diarrhea since May 2018, metastatic malignant carcinoid tumor, post gastrojejunostomy and partial small-bowel resection on 08/02/2017, near syncope today ; history coronary artery disease, type II diabetes mellitus, essential hypertension, peripheral vascular disease EXAM: CT ABDOMEN AND PELVIS WITHOUT CONTRAST TECHNIQUE: Multidetector CT imaging of the abdomen and pelvis was performed following the standard protocol without IV contrast. Sagittal and coronal MPR images reconstructed from axial data set. COMPARISON:  07/28/2017 FINDINGS: Lower chest: Mild atelectasis at lung bases. Tiny nodular foci at lung bases unchanged. Hepatobiliary: Liver unremarkable.  Gallbladder unremarkable. Pancreas: Normal appearance Spleen: Normal appearance Adrenals/Urinary Tract: Adrenal glands normal appearance. BILATERAL nonobstructing renal calculi largest LEFT kidney 12 mm diameter image 46. No definite hydronephrosis or hydroureter. Bladder unremarkable. Stomach/Bowel: Prior gastrojejunostomy. Stomach incompletely distended, limiting assessment of wall thickness but appears minimally thickened. Diffuse thickening of the proximal jejunum as well as additional scattered bowel loops, could be postsurgical though wall thickening can also be seen with  infection and ischemia. Significant amount of associated free intraperitoneal air and fluid is atypical for 16 days post abdominal surgery and consistent with perforated viscus. A discrete anastomotic leak is not seen. Source of perforation is not localized. Colon decompressed without wall thickening. Vascular/Lymphatic: Extensive atherosclerotic calcifications of aorta, abdominal branch vessels common iliac arteries and femoral arteries. Coronary retro calcifications noted. Reproductive: Few prostatic calcifications, nonspecific Other: Tiny umbilical hernia. Significant free intraperitoneal air and free fluid as above. Scattered edema of intra-abdominal and subcutaneous tissue planes. Scattered mesenteric edema with again identified calcified mesenteric mass within the small bowel mesenteries compatible with known carcinoid tumor. Additional partially calcified soft tissue density is seen LEFT para-aortic which may represent either adenopathy or direct tumor extension, Musculoskeletal: Osseous demineralization with degenerative changes of the lumbar spine. IMPRESSION: Extensive free air in free fluid, atypical for 16 days post abdominal surgery compatible with a perforated viscus. Site of perforation is uncertain, without discrete localization of a bowel perforation or anastomotic leak by CT. Bowel wall thickening at the gastrojejunostomy anastomosis but as well throughout multiple small bowel loops in the upper abdomen and pelvis. In light of the patient's extensive atherosclerotic disease and identification of ischemic bowel at surgery, findings are concerning for small bowel ischemia though wall thickening can also be seen with prolonged handling of bowel at surgery, infection, and inflammatory bowel disease. Known partially calcified mass within the mesenteric compatible with carcinoid tumor with tumor extension versus adenopathy LEFT para-aortic. Critical Value/emergent results were called by telephone at the  time of interpretation on 08/18/2017 at 2:47 pm to Dr. Curlene Labrum, who verbally acknowledged these results. Electronically Signed   By: Lavonia Dana M.D.   On: 08/18/2017 14:49   Dg Abd Acute W/chest  Result Date: 08/18/2017 CLINICAL DATA:  Diarrhea, weakness. EXAM: DG ABDOMEN ACUTE W/ 1V CHEST COMPARISON:  Radiographs of August 07, 2017. CT scan of July 28, 2017. FINDINGS: No abnormal bowel gas pattern is noted. Left nephrolithiasis is noted. Phleboliths are noted in the pelvis. Midline surgical staples are noted pneumoperitoneum is noted under  both hemidiaphragms which may be due to patient's postoperative status. Heart size and mediastinal contours are within normal limits. Both lungs are clear. IMPRESSION: No evidence of bowel obstruction or ileus. No acute cardiopulmonary disease. Left nephrolithiasis is noted. Pneumoperitoneum is noted under both hemidiaphragms which may be due to patient's postoperative status, although according to ordering physician, this procedure was 15 days ago. The amount of air that remain is concerning for possible rupture of hollow viscus. Unenhanced CT scan of the abdomen pelvis may be performed for further evaluation. Critical Value/emergent results were called by telephone at the time of interpretation on 08/18/2017 at 12:31 pm to Dr. Orlie Dakin , who verbally acknowledged these results. Electronically Signed   By: Marijo Conception, M.D.   On: 08/18/2017 12:31    EKG: Independently reviewed. 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 [facility])    Intra abdominal free air - prepping for emergent surgery - type and cross - will admit to ICU - pending  operative findings to determine treatment course (may need palliative consult if significant diffuse ischemia noted) - discussed plan with Gen Surg - levophed ordered for low pressures - will have central line placed in OR  Atrial fibrillation - was taking eliquis - type and cross - hold eliquis for surgery   CKD - monitor Cr  - avoid nephrotoxins - daily BMP  Diabetes Mellitus Type II - CBG monitoring ACHS - SSI  HLD - holding statin   DVT prophylaxis: None  Code Status:  Full Code (of note patient was previously a DNR but understands that going to surgery he is agreeing to being a full code. He is in agreement to be placed on pressors as well if he were to need them)  Family Communication:  Girlfriend is bedside Disposition Plan: unclear- pending operative course  Consults called:  Gen Surg  Admission status: Inpatient ICU   Loretha Stapler MD [facility] Pager [phone]  If 7PM-7AM, please contact night-coverage [email or web address] Password [facility]  08/18/2017, 3:33 PM

## 2017-08-18 NOTE — ED Notes (Signed)
Patient transported to X-ray 

## 2017-08-18 NOTE — ED Notes (Signed)
Patient denies pain and is resting comfortably.  

## 2017-08-18 NOTE — Procedures (Signed)
Procedure Note  08/18/17   Preoperative Diagnosis: pneumoperitoneum, hypotension    Postoperative Diagnosis: Same   Procedure(s) Performed: Central Line placement, right subclavian    Surgeon: Lanell Matar. Constance Haw, MD   Assistants: None   Anesthesia: 1% lidocaine    Complications: None    Indications: Stephen [name] is a 76 y.o. with pneumoperitoneum of unknown etiology who presents with hypotension likely related to severe sepsis secondary to his bowel perforation . I discussed the risk and benefits of placement of the central line, including but not limited to bleeding, infection, and risk of pneumothorax. The patient and his family has given written consent for the procedure.    Procedure: The patient placed supine. The right chest and neck was prepped and draped in the usual sterile fashion.  Wearing full gown and gloves, I performed the procedure.  One percent lidocaine was used for local anesthesia. An ultrasound was utilized to assess the jugular vein.  The needle with syringe was advanced into the subclavian vein with dark venous return, and a wire was placed using the Seldinger technique with some resistance. The wire would not thred. The needle was removed and redirected, and again dark venous blood returned. The wire was then placed using the Seldinger technique without difficulty.  Ectopia was not noted.  The skin was knicked and a dilator was placed, and the three lumen catheter was placed over the wire with continued control of the wire.  There was good draw back of blood from all three lumens and each flushed easily with saline.  The catheter was secured in 4 points with 2-0 silk and a biopatch and dressing was placed.     The patient tolerated the procedure well, and the CXR was ordered to confirm position of the central line.   Curlene Labrum, MD [facility] [address] [place], [place] [address] [phone] (office)

## 2017-08-19 ENCOUNTER — Ambulatory Visit: Payer: PPO | Admitting: General Surgery

## 2017-08-19 ENCOUNTER — Ambulatory Visit (INDEPENDENT_AMBULATORY_CARE_PROVIDER_SITE_OTHER): Payer: PPO | Admitting: Internal Medicine

## 2017-08-19 ENCOUNTER — Other Ambulatory Visit: Payer: Self-pay

## 2017-08-19 ENCOUNTER — Telehealth (INDEPENDENT_AMBULATORY_CARE_PROVIDER_SITE_OTHER): Payer: Self-pay | Admitting: Internal Medicine

## 2017-08-19 ENCOUNTER — Encounter (HOSPITAL_COMMUNITY): Payer: Self-pay | Admitting: General Surgery

## 2017-08-19 LAB — CBC
HCT: 20.3 % — ABNORMAL LOW (ref 39.0–52.0)
HEMATOCRIT: 23.5 % — AB (ref 39.0–52.0)
HEMOGLOBIN: 7.5 g/dL — AB (ref 13.0–17.0)
Hemoglobin: 6.6 g/dL — CL (ref 13.0–17.0)
MCH: 29.3 pg (ref 26.0–34.0)
MCH: 28.8 pg (ref 26.0–34.0)
MCHC: 31.9 g/dL (ref 30.0–36.0)
MCHC: 32.5 g/dL (ref 30.0–36.0)
MCV: 91.8 fL (ref 78.0–100.0)
MCV: 88.6 fL (ref 78.0–100.0)
Platelets: 212 10*3/uL (ref 150–400)
Platelets: 183 10*3/uL (ref 150–400)
RBC: 2.56 MIL/uL — AB (ref 4.22–5.81)
RBC: 2.29 MIL/uL — ABNORMAL LOW (ref 4.22–5.81)
RDW: 18.2 % — AB (ref 11.5–15.5)
RDW: 18.2 % — ABNORMAL HIGH (ref 11.5–15.5)
WBC: 16.4 10*3/uL — AB (ref 4.0–10.5)
WBC: 13.2 10*3/uL — ABNORMAL HIGH (ref 4.0–10.5)

## 2017-08-19 LAB — BASIC METABOLIC PANEL
Anion gap: 11 (ref 5–15)
BUN: 77 mg/dL — AB (ref 6–20)
CO2: 17 mmol/L — AB (ref 22–32)
Calcium: 6.7 mg/dL — ABNORMAL LOW (ref 8.9–10.3)
Chloride: 107 mmol/L (ref 101–111)
Creatinine, Ser: 3.09 mg/dL — ABNORMAL HIGH (ref 0.61–1.24)
GFR calc Af Amer: 21 mL/min — ABNORMAL LOW (ref >60)
GFR, EST NON AFRICAN AMERICAN: 18 mL/min — AB (ref >60)
GLUCOSE: 264 mg/dL — AB (ref 65–99)
POTASSIUM: 3.8 mmol/L (ref 3.5–5.1)
Sodium: 135 mmol/L (ref 135–145)

## 2017-08-19 LAB — BLOOD GAS, ARTERIAL
ACID-BASE DEFICIT: 6.1 mmol/L — AB (ref 0.0–2.0)
Acid-base deficit: 13.3 mmol/L — ABNORMAL HIGH (ref 0.0–2.0)
BICARBONATE: 19.7 mmol/L — AB (ref 20.0–28.0)
Bicarbonate: 14.1 mmol/L — ABNORMAL LOW (ref 20.0–28.0)
DRAWN BY: 28459
Drawn by: 277331
FIO2: 40
FIO2: 0.4
LHR: 16 {breaths}/min
MECHVT: 600 mL
O2 Saturation: 99.3 %
O2 Saturation: 99.4 %
PEEP/CPAP: 5 cmH2O
PEEP: 5 cmH2O
Patient temperature: 37
Patient temperature: 37
RATE: 30 resp/min
VT: 500 mL
pCO2 arterial: 24.6 mmHg — ABNORMAL LOW (ref 32.0–48.0)
pCO2 arterial: 25 mmHg — ABNORMAL LOW (ref 32.0–48.0)
pH, Arterial: 7.303 — ABNORMAL LOW (ref 7.350–7.450)
pH, Arterial: 7.451 — ABNORMAL HIGH (ref 7.350–7.450)
pO2, Arterial: 206 mmHg — ABNORMAL HIGH (ref 83.0–108.0)
pO2, Arterial: 194 mmHg — ABNORMAL HIGH (ref 83.0–108.0)

## 2017-08-19 LAB — HEMOGLOBIN AND HEMATOCRIT, BLOOD
HCT: 22.2 % — ABNORMAL LOW (ref 39.0–52.0)
HEMOGLOBIN: 7.2 g/dL — AB (ref 13.0–17.0)

## 2017-08-19 LAB — COMPREHENSIVE METABOLIC PANEL
ALBUMIN: 2.2 g/dL — AB (ref 3.5–5.0)
ALT: 24 U/L (ref 17–63)
AST: 53 U/L — AB (ref 15–41)
Alkaline Phosphatase: 50 U/L (ref 38–126)
Anion gap: 12 (ref 5–15)
BUN: 78 mg/dL — AB (ref 6–20)
CHLORIDE: 109 mmol/L (ref 101–111)
CO2: 13 mmol/L — ABNORMAL LOW (ref 22–32)
Calcium: 7 mg/dL — ABNORMAL LOW (ref 8.9–10.3)
Creatinine, Ser: 2.86 mg/dL — ABNORMAL HIGH (ref 0.61–1.24)
GFR calc Af Amer: 23 mL/min — ABNORMAL LOW (ref >60)
GFR, EST NON AFRICAN AMERICAN: 20 mL/min — AB (ref >60)
Glucose, Bld: 230 mg/dL — ABNORMAL HIGH (ref 65–99)
POTASSIUM: 4.1 mmol/L (ref 3.5–5.1)
Sodium: 134 mmol/L — ABNORMAL LOW (ref 135–145)
Total Bilirubin: 1.1 mg/dL (ref 0.3–1.2)
Total Protein: 4.4 g/dL — ABNORMAL LOW (ref 6.5–8.1)

## 2017-08-19 LAB — BPAM PLATELET PHERESIS
Blood Product Expiration Date: 201811302359
ISSUE DATE / TIME: 201811282127
Unit Type and Rh: 6200

## 2017-08-19 LAB — MRSA PCR SCREENING: MRSA by PCR: NEGATIVE

## 2017-08-19 LAB — PREPARE PLATELET PHERESIS: Unit division: 0

## 2017-08-19 LAB — PROTIME-INR
INR: 2.52
Prothrombin Time: 27 seconds — ABNORMAL HIGH (ref 11.4–15.2)

## 2017-08-19 LAB — GLUCOSE, CAPILLARY
GLUCOSE-CAPILLARY: 162 mg/dL — AB (ref 65–99)
GLUCOSE-CAPILLARY: 219 mg/dL — AB (ref 65–99)
GLUCOSE-CAPILLARY: 238 mg/dL — AB (ref 65–99)
GLUCOSE-CAPILLARY: 241 mg/dL — AB (ref 65–99)
Glucose-Capillary: 200 mg/dL — ABNORMAL HIGH (ref 65–99)
Glucose-Capillary: 256 mg/dL — ABNORMAL HIGH (ref 65–99)

## 2017-08-19 LAB — TROPONIN I
TROPONIN I: 0.62 ng/mL — AB (ref <0.03)
TROPONIN I: 0.38 ng/mL — AB (ref <0.03)
Troponin I: 0.48 ng/mL (ref <0.03)

## 2017-08-19 MED ORDER — FENTANYL CITRATE (PF) 100 MCG/2ML IJ SOLN
50.0000 ug | Freq: Once | INTRAMUSCULAR | Status: AC
  Administered 2017-08-19: 50 ug via INTRAVENOUS

## 2017-08-19 MED ORDER — NOREPINEPHRINE BITARTRATE 1 MG/ML IV SOLN
INTRAVENOUS | Status: AC
  Filled 2017-08-19: qty 4

## 2017-08-19 MED ORDER — FENTANYL 2500MCG IN NS 250ML (10MCG/ML) PREMIX INFUSION
INTRAVENOUS | Status: AC
  Filled 2017-08-19: qty 250

## 2017-08-19 MED ORDER — SODIUM CHLORIDE 0.9 % IV SOLN
INTRAVENOUS | Status: AC
  Administered 2017-08-19 – 2017-08-20 (×3): via INTRAVENOUS

## 2017-08-19 MED ORDER — NOREPINEPHRINE 4 MG/250ML-% IV SOLN
INTRAVENOUS | Status: AC
  Filled 2017-08-19: qty 250

## 2017-08-19 MED ORDER — FENTANYL BOLUS VIA INFUSION
25.0000 ug | INTRAVENOUS | Status: DC | PRN
  Administered 2017-08-19: 25 ug via INTRAVENOUS
  Filled 2017-08-19: qty 25

## 2017-08-19 MED ORDER — SODIUM CHLORIDE 0.9 % IV SOLN
Freq: Once | INTRAVENOUS | Status: AC
  Administered 2017-08-19: 16:00:00 via INTRAVENOUS

## 2017-08-19 MED ORDER — SODIUM CHLORIDE 0.9 % IV SOLN
25.0000 ug/h | INTRAVENOUS | Status: DC
  Administered 2017-08-19: 25 ug/h via INTRAVENOUS
  Administered 2017-08-19 – 2017-08-20 (×2): 150 ug/h via INTRAVENOUS
  Administered 2017-08-20: 22 ug/h via INTRAVENOUS
  Administered 2017-08-21: 25 ug/h via INTRAVENOUS
  Filled 2017-08-19 (×2): qty 50

## 2017-08-19 NOTE — Progress Notes (Signed)
Pt dislodged ETAD and removed bite block. ETT remained in place. Replaced ETAD and bite block. Equal bilateral breath sounds. Good volumes on ventilator

## 2017-08-19 NOTE — Consult Note (Signed)
Consultation Note Date: 08/19/2017   Patient Name: Stephen [name]  DOB: December 11, 1940  MRN: [id number]  Age / Sex: 76 y.o., male  PCP: [name], MD Referring Physician: Eber Jones, MD  Reason for Consultation: Establishing goals of care and Psychosocial/spiritual support  HPI/Patient Profile: 76 y.o. 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   Clinical Assessment and Goals of Care: Mr. Chillemi is lying quietly in bed.  He is intubated/ventilated and sedated, but will open his eyes and nod yes or no.  Present today at bedside is his daughter, Florestine Avers.  Shirlean Mylar, her husband Marlou Sa, and Mr. Brodman's son Nicole Kindred,  and I go to my office for a family meeting.  Conference with hospitalist Dr. Adair Patter, and surgeon Dr. Constance Haw.  We talked about Mr. Hardgrove home life.  Shirlean Mylar states that he was self-employed logger for many years, he enjoys being active and outdoors.  They share that his life changed and may due to diarrhea, and much testing before finding that he had cancer.  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  He lives alone, but has a fianc, Optometrist.  Shirlean Mylar got groceries, and did housework every other weekend, Jane cooked.  We talked about Mr. Furuya functional status at home prior to this surgery.  Shirlean Mylar states that he was so very weak after the first  surgery.  I share a diagram of the chronic illness pathway, what is normal and expected.  Son-in-law Marlou Sa states "are you trying to get to something".  I share that by speaking about Mr. Bentson functional status, we can really see him.  We talked about who he is and what is important to him, this will help guide decision-making.  During our conversation, son Nicole Kindred is unable to continue and leaves.  We talked about 24-48 hours for outcomes.  I share that Mr. Thielen may have a sharp sudden decline.  Marlou Sa asks if this could be temporary or permanent declines, I share that it could be either.  We talked about how to make choices for Mr. Cockrell including 1) keep him at the center of decision making, 2) are we doing something to him or for him (are we changing what is happening), 3) what would the person he was 10 years ago say about his life now.  We talked about recovery times being long, if Mr. Wadsworth survives.  I share that study show older people who have extended stays in the intensive care suffer increased functional decline, urinary and fecal incontinence.  We talked about the concepts of treat the treatable, but no  CPR no intubation.  We also talked about the concept of "let nature take its course".  I give an example of when this is appropriate.  I share that although it is not illegal, or unethical, we realize that some people have a moral problem with not treating.  I share my worry about Mr. Thompson Caul ability to recover.  I asked Shirlean Mylar what worries her, she states "everything".  I asked again what worries him, and he states functional status, ability to recover.  I encouraged for 24-48 hours for outcomes, but also to look for "meaningful recovery".   Healthcare power of attorney NEXT OF KIN -primary decision maker is daughter, Florestine Avers.  Mr. Tamala Julian he has a son, Audon Heymann, but he declines to make decisions.   SUMMARY OF RECOMMENDATIONS   At this point, 24-48 hours for outcomes. We  discussed the possibility of sharp sudden declines leading to death.  Code Status/Advance Care Planning:  DNR  Symptom Management:   Per hospitalist, surgeon, no additional needs.   Palliative Prophylaxis:   Frequent Pain Assessment and Turn Reposition  Additional Recommendations (Limitations, Scope, Preferences):  NO CPR, no cardioversion  Psycho-social/Spiritual:   Desire for further Chaplaincy support:yes  Additional Recommendations: Caregiving  Support/Resources  Prognosis:   < 6 weeks or less would not be surprising based on severity of illness.   Discharge Planning: In hospital death would not be surprising      Primary Diagnoses: Present on Admission: . Intra-abdominal free air of unknown etiology . Essential hypertension . Hyperlipidemia . CKD (chronic kidney disease) stage 3, GFR 30-59 ml/min (HCC) . Metastatic malignant carcinoid tumor to liver [facility])   I have reviewed the medical record, interviewed the patient and family, and examined the patient. The following aspects are pertinent.  Past Medical History:  Diagnosis Date  . Anemia of chronic renal failure, stage 3 (moderate) (HCC)   . Atrial fibrillation ([place])    Documented 08/2016  . B12 deficiency 08/17/2016  . Cancer ([place])    carcinoma of bowelcancer  . Carotid artery occlusion    Right carotid endarterectomy 2001; left carotid endarterectomy in 2008  . Celiac disease   . Chronic renal disease, stage 3, moderately decreased glomerular filtration rate between 30-59 mL/min/1.73 square meter ([place]) 06/01/2012  . Coronary atherosclerosis of native coronary artery    PTCA of LAD in 1994; total obstruction of the RCA treated medically; subsequent stress nuclear study with inferior ischemia  . DDD (degenerative disc disease), lumbar    Lumbar surgery in 1984  . Diabetes mellitus type II   . Essential hypertension   . History of stroke    Right brain 2001; right PCA, MCA/PCA, left SCA infarcts  08/2017 with atrial fibrillation  . Hyperlipidemia   . Iron deficiency anemia 12/31/2016  . Peripheral vascular disease ([place])    Social History   Socioeconomic History  . Marital status: Widowed    Spouse name: None  . Number of children: None  . Years of education: None  . Highest education level: None  Social Needs  . Financial resource strain: None  . Food insecurity - worry: None  . Food insecurity - inability: None  . Transportation needs - medical: None  . Transportation needs - non-medical: None  Occupational History  . None  Tobacco Use  . Smoking status: Former Smoker    Packs/day: 3.00    Years: 10.00    Pack years: 30.00    Types: Cigarettes    Last attempt  to quit: 09/21/1972    Years since quitting: 44.9  . Smokeless tobacco: Never Used  Substance and Sexual Activity  . Alcohol use: No    Alcohol/week: 0.0 oz  . Drug use: No  . Sexual activity: Not Currently    Partners: Male  Other Topics Concern  . None  Social History Narrative  . None   Family History  Problem Relation Age of Onset  . Heart disease Mother        Before age 23  . Diabetes Mother   . Heart attack Mother   . Diabetes Sister   . Heart disease Brother   . Diabetes Brother   . Diabetes Daughter   . Diabetes Son   . Hyperlipidemia Son    Scheduled Meds: . chlorhexidine gluconate (MEDLINE KIT)  15 mL Mouth Rinse BID  . insulin aspart  0-9 Units Subcutaneous Q4H  . mouth rinse  15 mL Mouth Rinse QID  . pantoprazole (PROTONIX) IV  40 mg Intravenous Daily   Continuous Infusions: . fentaNYL infusion INTRAVENOUS 100 mcg/hr (08/19/17 0900)  . norepinephrine (LEVOPHED) Adult infusion 10 mcg/min (08/19/17 1147)  . piperacillin-tazobactam (ZOSYN)  IV Stopped (08/19/17 1015)  .  sodium bicarbonate  infusion 1000 mL 100 mL/hr at 08/19/17 1048  . vasopressin (PITRESSIN) infusion - *FOR SHOCK* 0.03 Units/min (08/18/17 2221)   PRN Meds:.acetaminophen **OR** acetaminophen, fentaNYL,  midazolam, midazolam, ondansetron **OR** ondansetron (ZOFRAN) IV Medications Prior to Admission:  Prior to Admission medications   Medication Sig Start Date End Date Taking? Authorizing Provider  allopurinol (ZYLOPRIM) 100 MG tablet Take 200 mg by mouth daily.  12/16/16  Yes [provider]  apixaban (ELIQUIS) 5 MG TABS tablet Take 1 tablet (5 mg total) by mouth 2 (two) times daily. 04/26/17  Yes Rosalin Hawking, MD  aspirin 81 MG tablet Take 81 mg by mouth daily.   Yes [provider]  atenolol (TENORMIN) 25 MG tablet Take 0.5 tablets (12.5 mg total) by mouth daily. 08/11/17 09/10/17 Yes Johnson, Clanford L, MD  Cholecalciferol (VITAMIN D-3) 1000 units CAPS Take 1 capsule by mouth daily.   Yes [provider]  furosemide (LASIX) 40 MG tablet Take 1 tablet (40 mg total) by mouth daily as needed for fluid or edema (or more than 2-lbs of weight gain). Patient taking differently: Take 60 mg daily by mouth.  07/09/17  Yes Short, Noah Delaine, MD  insulin detemir (LEVEMIR) 100 UNIT/ML injection Inject 0.1 mLs (10 Units total) into the skin at bedtime. 08/10/17  Yes Johnson, Clanford L, MD  losartan (COZAAR) 25 MG tablet Take 1 tablet (25 mg total) by mouth daily. 08/11/17 09/10/17 Yes Johnson, Clanford L, MD  Melatonin 10 MG CAPS Take 10 mg by mouth at bedtime.   Yes [provider]  Multiple Vitamin (MULTIVITAMIN WITH MINERALS) TABS tablet Take 1 tablet by mouth daily. 08/11/17  Yes Johnson, Clanford L, MD  OVER THE COUNTER MEDICATION OTC Sleepinal - patient takes 1 at bedtime.   Yes [provider]  oxyCODONE-acetaminophen (PERCOCET/ROXICET) 5-325 MG tablet Take 1-2 tablets by mouth every 6 (six) hours as needed for severe pain. 08/16/17  Yes Twana First, MD  pantoprazole (PROTONIX) 40 MG tablet Take 1 tablet (40 mg total) by mouth daily before breakfast. 07/22/17  Yes Rehman, Mechele Dawley, MD  pravastatin (PRAVACHOL) 40 MG tablet Take 40 mg by mouth daily.   Yes  [provider]  Probiotic Product (PROBIOTIC DAILY PO) Take 1 tablet daily by mouth.  Yes [provider]  Psyllium (EQ DAILY FIBER PO) Take 1 capsule every morning by mouth. Per Patient's daughter states that he takes 1 a every morning.    Yes [provider]  sodium bicarbonate 650 MG tablet Take 650 mg by mouth 3 (three) times daily.    Yes [provider]  vitamin B-12 (CYANOCOBALAMIN) 1000 MCG tablet Take 1 tablet (1,000 mcg total) by mouth daily. Take 2 tabs by mouth daily. 07/09/17  Yes Short, Noah Delaine, MD  acetaminophen (TYLENOL) 500 MG tablet Take 500 mg by mouth every 6 (six) hours as needed.    [provider]  docusate sodium (COLACE) 100 MG capsule Take 1 capsule (100 mg total) by mouth daily. 08/10/17   Johnson, Clanford L, MD  feeding supplement, ENSURE ENLIVE, (ENSURE ENLIVE) LIQD Take 237 mLs by mouth 2 (two) times daily between meals. 07/09/17   Janece Canterbury, MD  prochlorperazine (COMPAZINE) 10 MG tablet Take 10 mg every 6 (six) hours as needed by mouth for nausea or vomiting.  07/28/17   [provider]   No Known Allergies Review of Systems  Unable to perform ROS: Intubated    Physical Exam  Constitutional: No distress.  Intubated/ventilated, sedated  HENT:  Head: Atraumatic.  Some temporal wasting.   Cardiovascular: Normal rate.  Pulmonary/Chest: No respiratory distress.  Intubated/ventilated, sedated  Abdominal: Soft.  Mid line abd drsg serosang drain. LLQ ostomy, brown drain, RLQ JP drain bloody drain.   Musculoskeletal: He exhibits no edema.  Neurological:  Intubated/ventilated, sedated  Skin: Skin is warm and dry.  Nursing note and vitals reviewed.   Vital Signs: BP 101/64   Pulse 88   Temp 98.2 F (36.8 C) (Axillary)   Resp 16   Ht 6' (1.829 m)   Wt 89.5 kg (197 lb 5 oz)   SpO2 100%   BMI 26.76 kg/m  Pain Assessment: CPOT   Pain Score: Asleep   SpO2: SpO2: 100 % O2 Device:SpO2: 100  % O2 Flow Rate: .O2 Flow Rate (L/min): 3 L/min  IO: Intake/output summary:   Intake/Output Summary (Last 24 hours) at 08/19/2017 1245 Last data filed at 08/19/2017 0900 Gross per 24 hour  Intake 7369.29 ml  Output 2380 ml  Net 4989.29 ml    LBM: Last BM Date: 08/19/17 Baseline Weight: Weight: 83.9 kg (185 lb) Most recent weight: Weight: 89.5 kg (197 lb 5 oz)     Palliative Assessment/Data:   Flowsheet Rows     Most Recent Value  Intake Tab  Referral Department  Hospitalist  Unit at Time of Referral  ICU  Palliative Care Primary Diagnosis  Cancer  Date Notified  08/19/17  Palliative Care Type  New Palliative care  Reason for referral  Clarify Goals of Care, Psychosocial or Spiritual support  Date of Admission  08/18/17  Date first seen by Palliative Care  08/19/17  # of days Palliative referral response time  0 Day(s)  # of days IP prior to Palliative referral  1  Clinical Assessment  Palliative Performance Scale Score  20%  Pain Max last 24 hours  Not able to report  Pain Min Last 24 hours  Not able to report  Dyspnea Max Last 24 Hours  Not able to report  Dyspnea Min Last 24 hours  Not able to report  Psychosocial & Spiritual Assessment  Palliative Care Outcomes  Patient/Family meeting held?  Yes  Who was at the meeting?  Daughter Shirlean Mylar, her husband Marlou Sa, son Nicole Kindred  Palliative  Care Outcomes  Clarified goals of care, Provided psychosocial or spiritual support, Provided advance care planning      Time In: 0930 Time Out: 1050 Time Total: 80 minutes Greater than 50%  of this time was spent counseling and coordinating care related to the above assessment and plan.  Signed by: Drue Novel, NP   Please contact Palliative Medicine Team phone at [phone] for questions and concerns.  For individual provider: See Shea Evans

## 2017-08-19 NOTE — Telephone Encounter (Signed)
Dr.Rehman made aware. 

## 2017-08-19 NOTE — Progress Notes (Signed)
[facility] Progress Note  1 Day Post-Op  Subjective: Remains on levophed at 14 and vasopresin. Looks comfortable but getting a little agitated with the vent. Awake and interacting.  Palliative has talked to the family and I spoke with the extensively last night regarding the prognosis.    Objective: Vital signs in last 24 hours: Temp:  [94.3 F (34.6 C)-98.2 F (36.8 C)] 98.2 F (36.8 C) (11/29 1115) Pulse Rate:  [67-105] 88 (11/29 1115) Resp:  [12-90] 16 (11/29 1115) BP: (35-212)/(15-190) 101/64 (11/29 1100) SpO2:  [80 %-100 %] 100 % (11/29 1216) Arterial Line BP: (42-141)/(25-61) 128/55 (11/29 1100) FiO2 (%):  [40 %-50 %] 40 % (11/29 1216) Weight:  [185 lb (83.9 kg)-197 lb 5 oz (89.5 kg)] 197 lb 5 oz (89.5 kg) (11/29 0500) Last BM Date: 08/19/17  Intake/Output from previous day: 11/28 0701 - 11/29 0700 In: 6510.6 [I.V.:4615; Blood:1895.7] Out: 2290 [Urine:500; Emesis/NG output:300; Drains:990; Blood:500] Intake/Output this shift: Total I/O In: 858.7 [I.V.:808.7; IV Piggyback:50] Out: 18 [Drains:90]  General appearance: alert, cooperative and mild distress Resp: on vent, breathing comfortably GI: soft, appropriately tender, JP with dark SS output, ostomy retracting and sloughing, dark output, midline with some staining on dressing Neurologic: Mental status: 11T  Lab Results:  Recent Labs    08/18/17 2240 08/19/17 0401 08/19/17 0718  WBC 12.7* 16.4*  --   HGB 8.6* 7.5* 7.2*  HCT 27.4* 23.5* 22.2*  PLT 188 212  --    BMET Recent Labs    08/18/17 2240 08/19/17 0401  NA 132* 134*  K 4.5 4.1  CL 111 109  CO2 10* 13*  GLUCOSE 215* 230*  BUN 77* 78*  CREATININE 2.61* 2.86*  CALCIUM 6.7* 7.0*   PT/INR Recent Labs    08/18/17 2240 08/19/17 0726  LABPROT 25.1* 27.0*  INR 2.30 2.52    Studies/Results: Ct Abdomen Pelvis Wo Contrast  Result Date: 08/18/2017 CLINICAL DATA:  Weakness and diarrhea since May 2018, metastatic malignant  carcinoid tumor, post gastrojejunostomy and partial small-bowel resection on 08/02/2017, near syncope today ; history coronary artery disease, type II diabetes mellitus, essential hypertension, peripheral vascular disease EXAM: CT ABDOMEN AND PELVIS WITHOUT CONTRAST TECHNIQUE: Multidetector CT imaging of the abdomen and pelvis was performed following the standard protocol without IV contrast. Sagittal and coronal MPR images reconstructed from axial data set. COMPARISON:  07/28/2017 FINDINGS: Lower chest: Mild atelectasis at lung bases. Tiny nodular foci at lung bases unchanged. Hepatobiliary: Liver unremarkable.  Gallbladder unremarkable. Pancreas: Normal appearance Spleen: Normal appearance Adrenals/Urinary Tract: Adrenal glands normal appearance. BILATERAL nonobstructing renal calculi largest LEFT kidney 12 mm diameter image 46. No definite hydronephrosis or hydroureter. Bladder unremarkable. Stomach/Bowel: Prior gastrojejunostomy. Stomach incompletely distended, limiting assessment of wall thickness but appears minimally thickened. Diffuse thickening of the proximal jejunum as well as additional scattered bowel loops, could be postsurgical though wall thickening can also be seen with infection and ischemia. Significant amount of associated free intraperitoneal air and fluid is atypical for 16 days post abdominal surgery and consistent with perforated viscus. A discrete anastomotic leak is not seen. Source of perforation is not localized. Colon decompressed without wall thickening. Vascular/Lymphatic: Extensive atherosclerotic calcifications of aorta, abdominal branch vessels common iliac arteries and femoral arteries. Coronary retro calcifications noted. Reproductive: Few prostatic calcifications, nonspecific Other: Tiny umbilical hernia. Significant free intraperitoneal air and free fluid as above. Scattered edema of intra-abdominal and subcutaneous tissue planes. Scattered mesenteric edema with again identified  calcified mesenteric mass within the small bowel mesenteries  compatible with known carcinoid tumor. Additional partially calcified soft tissue density is seen LEFT para-aortic which may represent either adenopathy or direct tumor extension, Musculoskeletal: Osseous demineralization with degenerative changes of the lumbar spine. IMPRESSION: Extensive free air in free fluid, atypical for 16 days post abdominal surgery compatible with a perforated viscus. Site of perforation is uncertain, without discrete localization of a bowel perforation or anastomotic leak by CT. Bowel wall thickening at the gastrojejunostomy anastomosis but as well throughout multiple small bowel loops in the upper abdomen and pelvis. In light of the patient's extensive atherosclerotic disease and identification of ischemic bowel at surgery, findings are concerning for small bowel ischemia though wall thickening can also be seen with prolonged handling of bowel at surgery, infection, and inflammatory bowel disease. Known partially calcified mass within the mesenteric compatible with carcinoid tumor with tumor extension versus adenopathy LEFT para-aortic. Critical Value/emergent results were called by telephone at the time of interpretation on 08/18/2017 at 2:47 pm to Dr. Curlene Labrum, who verbally acknowledged these results. Electronically Signed   By: Lavonia Dana M.D.   On: 08/18/2017 14:49   Dg Chest Port 1 View  Result Date: 08/18/2017 CLINICAL DATA:  Central line placement EXAM: PORTABLE CHEST 1 VIEW COMPARISON:  08/18/2017 1150 hours FINDINGS: Lungs are under aerated with bibasilar atelectasis. Right subclavian central venous catheter place. Tip is at the cavoatrial junction. No evidence of pneumothorax. Normal heart size. IMPRESSION: Right subclavian vein central venous catheter placed with its tip at the cavoatrial junction and no pneumothorax Bibasilar atelectasis and low lung volumes. Electronically Signed   By: Marybelle Killings M.D.    On: 08/18/2017 17:03   Dg Chest Port 1v Same Day  Result Date: 08/18/2017 CLINICAL DATA:  Status post intubation. History of bowel cancer, DM, HTN, A-FIB, Renal disease, stroke, coronary atherosclerosis. EXAM: PORTABLE CHEST 1 VIEW COMPARISON:  08/18/2017 FINDINGS: Status post placement of endotracheal tube, tip approximately 5.5 cm above the carina. Nasogastric tube is in place with tip beyond the image beyond the gastroesophageal junction. A right subclavian central line tip overlies the level of superior vena cava. The heart size is normal. There is minimal right lower lobe atelectasis and probable trace effusion. IMPRESSION: Interval placement of endotracheal tube, and nasogastric tube. Small right pleural effusion and basilar atelectasis. Electronically Signed   By: Nolon Nations M.D.   On: 08/18/2017 21:09   Dg Abd Acute W/chest  Result Date: 08/18/2017 CLINICAL DATA:  Diarrhea, weakness. EXAM: DG ABDOMEN ACUTE W/ 1V CHEST COMPARISON:  Radiographs of August 07, 2017. CT scan of July 28, 2017. FINDINGS: No abnormal bowel gas pattern is noted. Left nephrolithiasis is noted. Phleboliths are noted in the pelvis. Midline surgical staples are noted pneumoperitoneum is noted under both hemidiaphragms which may be due to patient's postoperative status. Heart size and mediastinal contours are within normal limits. Both lungs are clear. IMPRESSION: No evidence of bowel obstruction or ileus. No acute cardiopulmonary disease. Left nephrolithiasis is noted. Pneumoperitoneum is noted under both hemidiaphragms which may be due to patient's postoperative status, although according to ordering physician, this procedure was 15 days ago. The amount of air that remain is concerning for possible rupture of hollow viscus. Unenhanced CT scan of the abdomen pelvis may be performed for further evaluation. Critical Value/emergent results were called by telephone at the time of interpretation on 08/18/2017 at 12:31 pm  to Dr. Orlie Dakin , who verbally acknowledged these results. Electronically Signed   By: Marijo Conception, M.D.  On: 08/18/2017 12:31    Anti-infectives: Anti-infectives (From admission, onward)   Start     Dose/Rate Route Frequency Ordered Stop   08/19/17 0600  cefoTEtan in Dextrose 5% (CEFOTAN) IVPB 2 g     2 g Intravenous On call to O.R. 08/18/17 1522 08/18/17 1720   08/18/17 1445  piperacillin-tazobactam (ZOSYN) IVPB 3.375 g  Status:  Discontinued     3.375 g 100 mL/hr over 30 Minutes Intravenous Every 6 hours 08/18/17 1432 08/18/17 1439   08/18/17 1445  piperacillin-tazobactam (ZOSYN) IVPB 3.375 g     3.375 g 12.5 mL/hr over 240 Minutes Intravenous Every 8 hours 08/18/17 1439        Assessment/Plan: Mr. Nydam is a 76 yo POD 1 s/p Ex lap, SBR and right hemicolectomy, end jejunostomy with likely only 150cm of small bowel remaining after ischemic bowel from his carcinoid tumor causing SMA compression.  He remains critical on the ventilator but is improving neurologically and his BP have started to trend upward.  He continues to have signs of a coagulopathy with the JP output being dark serosanginous, and his H&H dropping and INR being elevated.  His family understands that this is a poor prognosis and palliative is involved and helping with goals of care.  -NPO, ostomy care, leave NG in place -Transfuse to correct INR and anemia  -DNR  -Continue antibiotics for now given the ascites which was cloudy but not obviously infected and ischemic bowel  -Hold any anticoagulation given coagulopathy    LOS: 1 day    Virl Cagey 08/19/2017

## 2017-08-19 NOTE — Telephone Encounter (Signed)
I called the patient regarding his appointment this afternoon due to Dr. Laural Golden is running behind at the hospital.  Per the patient's son, the patient had emergency surgery at [facility] yesterday and is in ICU.  I cancelled his appointment.

## 2017-08-19 NOTE — Progress Notes (Signed)
RN spoke with Dr. Constance Haw concerning order for transfusion of plasma dated for 08/18/2017. It appears that the patient received 3 of the 4 units ordered. Dr. Constance Haw stated that it would be sufficient to only transfuse the 2 units of PRBCs and 2 units of plasma ordered by Dr. Adair Patter today 08/19/2017. Order for H&H will be placed when pt has received the PRBCs. Will continue to monitor.

## 2017-08-19 NOTE — Progress Notes (Signed)
Bite block placed on left side of ETT due to pt biting ETT

## 2017-08-19 NOTE — Anesthesia Postprocedure Evaluation (Signed)
Anesthesia Post Note  Patient: Stephen [name] [facility]  Procedure(s) Performed: EXPLORATORY LAPAROTOMY WITH HEMI COLLECTOMY (Right ) SMALL BOWEL RESECTION (N/A Abdomen) OSTOMY (N/A )  Patient location during evaluation: ICU Anesthesia Type: General Level of consciousness: awake and patient remains intubated per anesthesia plan Pain management: pain level controlled Vital Signs Assessment: post-procedure vital signs reviewed and stable Respiratory status: patient remains intubated per anesthesia plan and patient on ventilator - see flowsheet for VS Cardiovascular status: blood pressure returned to baseline : Unable to assess status due to intubation. Anesthetic complications: no     Last Vitals:  Vitals:   08/19/17 0915 08/19/17 0930  BP: (!) 118/54 (!) 100/53  Pulse: 91   Resp: 16 16  Temp:    SpO2: 100%     Last Pain:  Vitals:   08/19/17 0714  TempSrc: Axillary  PainSc:                  [name] J

## 2017-08-19 NOTE — Progress Notes (Signed)
Notified of critical troponin. MD notified.

## 2017-08-19 NOTE — Progress Notes (Signed)
Nutrition Follow-up  DOCUMENTATION CODES:     INTERVENTION:  RD will follow for pt progression and make additional recommendations as indicated   NUTRITION DIAGNOSIS:  Inadequate oral intake related to inability to eat as evidenced by NPO- due to vent status   GOAL:  Pt to meet needs based on ASPEN/SCCM guidelines   MONITOR:  Respiratory status    REASON FOR ASSESSMENT:  MST and ventilator support    ASSESSMENT:  Pt has hx of malnutrition and multiple chronic problems including metastatic carcinoid tumor of the abdomen. He is s/p gastrojejunostomy earlier this month. Palliative medicine has met with family to discuss goals.  Their desire is to wait 24-48 hr and see how pt is responding.  Patient is currently intubated on ventilator support and pt trigger on malnutrition screen for wt loss. His wt has been ranging between 87-90 kg the past 60 days. MV: 13.8 L/min Temp (24hrs), Avg:96.9 F (36.1 C), Min:94.3 F (34.6 C), Max:98.2 F (36.8 C)   Recent Labs  Lab 08/18/17 1131 08/18/17 2240 08/19/17 0401  NA 133* 132* 134*  K 4.4 4.5 4.1  CL 103 111 109  CO2 22 10* 13*  BUN 94* 77* 78*  CREATININE 2.97* 2.61* 2.86*  CALCIUM 8.3* 6.7* 7.0*  MG  --  1.5*  --   PHOS  --  5.7*  --   GLUCOSE 135* 215* 230*  Labs and meds reviewed. Abnormal renal fxn- CKD -3   NUTRITION - FOCUSED PHYSICAL EXAM:    Most Recent Value  Orbital Region  Mild depletion  Upper Arm Region  Moderate depletion  Buccal Region  Mild depletion  Temple Region  Moderate depletion  Clavicle and Acromion Bone Region  Moderate depletion  Dorsal Hand  Moderate depletion  Posterior Calf Region  Moderate depletion  Edema (RD Assessment)  Mild [right foot]  Hair  Reviewed  Eyes  Reviewed  Mouth  Reviewed  Skin  Reviewed  Nails  Reviewed     Diet Order:  Diet NPO time specified  EDUCATION NEEDS:     Skin:   stage II and surgical incision  Last BM:   prior to admission  Height:   Ht  Readings from Last 1 Encounters:  08/18/17 6' (1.829 m)    Weight:   Wt Readings from Last 1 Encounters:  08/19/17 197 lb 5 oz (89.5 kg)    Ideal Body Weight:   81 kg  BMI:  Body mass index is 26.76 kg/m.  Estimated Nutritional Needs:   Kcal:  1997  Protein:  85-90 gr  Fluid:  2.0 liters    Colman Cater MS,RD,CSG,LDN Office: [phone] Pager: [phone]

## 2017-08-19 NOTE — Addendum Note (Signed)
Addendum  created 08/19/17 1104 by Charmaine Downs, CRNA   Sign clinical note

## 2017-08-19 NOTE — Addendum Note (Signed)
Addendum  created 08/19/17 0820 by Ollen Bowl, CRNA   Charge Capture section accepted

## 2017-08-19 NOTE — Progress Notes (Signed)
PROGRESS NOTE    Stephen [name]  DDU:[id number] DOB: 07/01/1941 DOA: 08/18/2017 PCP: [name], MD    Brief Narrative:  Stephen [name] is a 76 y.o. male with medical history significant of metastici carcinoid tumor who recently underwent gastrojejunostomy and small bowel resection 16 days ago, CKF Stage III, Atrial fibrillation on eliquis presented to the ED with syncope.  Per Dr. Constance Haw' History and physical patient voiced he was having slightly decreased PO intake and diarrhea.  He underwent CT scan of the abdomen and began having abdominal pain.  No further history obtainable as patient in significant pain and prepping for surgery.  ED Course: Patient found to be hypotensive- received 2L boluses.  General surgeon Dr. Constance Haw evaluated after CT scan results showing free air, bowel wall thickening and concern for ischemia. Of note his labs show WBC of 8.5, H/H 8.8/28.6, Plt of 224.  Creatinine of 2.97 (baseline appears to be between 2.2-2.3). Patient to be admitted to ICU and to undergo surgery emergently.  Patient underwent Palliative exploratory laparotomy, distal small bowel resection (150cm) and right hemicolectomy, drain placement around the gastrojejunostomy  on 08/18/2017.  During his surgical course in the operating room he developed numerous cardiac arrhythmias as well as hypotension which was treated with a 3 unit PRBC transfusion as well as numerous pressure medications he was transferred to the ICU in critical condition.  He remained intubated and on 08/19/2017 palliative care was consulted for further discussion with patient's family about goals of care.  Patient's family states that they would like to wait the next 24-48 hours to see how patient progresses following his surgery.     Assessment & Plan:   Principal Problem:   Intra-abdominal free air of unknown etiology Active Problems:   Diabetes mellitus, type II ([place])   Essential hypertension   Hyperlipidemia   CKD  (chronic kidney disease) stage 3, GFR 30-59 ml/min (HCC)   Metastatic malignant carcinoid tumor to liver (HCC)   Pressure injury of skin   Intra abdominal free air s/p Palliative exploratory laparotomy, distal small bowel resection (150cm) and right hemicolectomy, drain placement around the gastrojejunostomy  -Received 3 units of PRBCs in the operating room -H&H still low this morning we will order another 2 unit PRBC transfusion -Received FFP in the operating room and will repeat with 2 units of FFP today even that patient's surgical site is oozing -Patient remains on vasopressin and levo fed -Patient remains on bicarb drip -Patient remains on ventilator -Patient in critical condition  Atrial fibrillation -Hold blood thinners -Patient remains in atrial fibrillation  Elevated troponin -Likely in the setting of hypotension as well as acute renal failure  Coagulopathy -INR elevated today -Patient received 2 units of FFP -Repeat CBC pending   CKD -Creatinine appears to be close to previous value - BMP pending -IV fluid resuscitation with pressors  Diabetes Mellitus Type II - CBG monitoring ACHS - SSI - Hyperglycemia  HLD - holding statin     DVT prophylaxis: none Code Status:DNR Family Communication: Patient's sister is bedside Disposition Plan: guarded   Consultants:   General Surgery  Palliative Care  Procedures:  Palliative exploratory laparotomy, distal small bowel resection (150cm) and right hemicolectomy, drain placement around the gastrojejunostomy   Antimicrobials:   Cefotetan    Subjective: Patient is intubated and on the ventilator.  He is able to awaken to name and shake his head yes and no.  Objective: Vitals:   08/19/17 0500 08/19/17 0700 08/19/17 0714 08/19/17  0800  BP:  (!) 95/54  92/69  Pulse:  95    Resp:  (!) 30 20 (!) 30  Temp:   (!) 97.5 F (36.4 C)   TempSrc:   Axillary   SpO2:  100%    Weight: 89.5 kg (197 lb 5 oz)      Height:        Intake/Output Summary (Last 24 hours) at 08/19/2017 0828 Last data filed at 08/19/2017 7939 Gross per 24 hour  Intake 6510.62 ml  Output 2290 ml  Net 4220.62 ml   Filed Weights   08/18/17 1527 08/18/17 2037 08/19/17 0500  Weight: 83.9 kg (185 lb) 86 kg (189 lb 9.5 oz) 89.5 kg (197 lb 5 oz)    Examination:  General exam: Appears calm and comfortable  Respiratory system: On ventilator Cardiovascular system: S1 & S2 heard, RRR. No JVD, murmurs, rubs, gallops or clicks. No pedal edema. Gastrointestinal system: Bandage over mid abdomen with serosanguineous drainage appreciated on bandage.  NG tube in place with copious bilious liquid in cannister Central nervous system: Sedated and on ventilator able to open eyes to name Extremities: S unable to evaluate Skin: Surgical site covered with bandage Psychiatry: Unable to evaluate    Data Reviewed: I have personally reviewed following labs and imaging studies  CBC: Recent Labs  Lab 08/16/17 0824 08/18/17 1131 08/18/17 2240 08/19/17 0401 08/19/17 0718  WBC 9.3 8.5 12.7* 16.4*  --   NEUTROABS 6.3 7.4 10.9*  --   --   HGB 9.3* 8.8* 8.6* 7.5* 7.2*  HCT 30.5* 28.6* 27.4* 23.5* 22.2*  MCV 97.8 97.6 92.3 91.8  --   PLT 263 224 188 212  --    Basic Metabolic Panel: Recent Labs  Lab 08/18/17 1131 08/18/17 2240 08/19/17 0401  NA 133* 132* 134*  K 4.4 4.5 4.1  CL 103 111 109  CO2 22 10* 13*  GLUCOSE 135* 215* 230*  BUN 94* 77* 78*  CREATININE 2.97* 2.61* 2.86*  CALCIUM 8.3* 6.7* 7.0*  MG  --  1.5*  --   PHOS  --  5.7*  --    GFR: Estimated Creatinine Clearance: 24.1 mL/min (A) (by C-G formula based on SCr of 2.86 mg/dL (H)). Liver Function Tests: Recent Labs  Lab 08/18/17 1131 08/19/17 0401  AST 12* 53*  ALT 9* 24  ALKPHOS 65 50  BILITOT 0.6 1.1  PROT 6.0* 4.4*  ALBUMIN 2.8* 2.2*   No results for input(s): LIPASE, AMYLASE in the last 168 hours. No results for input(s): AMMONIA in the last 168  hours. Coagulation Profile: Recent Labs  Lab 08/18/17 2240 08/19/17 0726  INR 2.30 2.52   Cardiac Enzymes: Recent Labs  Lab 08/18/17 2240  TROPONINI 0.03*   BNP (last 3 results) No results for input(s): PROBNP in the last 8760 hours. HbA1C: No results for input(s): HGBA1C in the last 72 hours. CBG: Recent Labs  Lab 08/18/17 1532 08/19/17 0044 08/19/17 0431 08/19/17 0713  GLUCAP 121* 162* 200* 238*   Lipid Profile: No results for input(s): CHOL, HDL, LDLCALC, TRIG, CHOLHDL, LDLDIRECT in the last 72 hours. Thyroid Function Tests: No results for input(s): TSH, T4TOTAL, FREET4, T3FREE, THYROIDAB in the last 72 hours. Anemia Panel: No results for input(s): VITAMINB12, FOLATE, FERRITIN, TIBC, IRON, RETICCTPCT in the last 72 hours. Sepsis Labs: No results for input(s): PROCALCITON, LATICACIDVEN in the last 168 hours.  Recent Results (from the past 240 hour(s))  MRSA PCR Screening     Status: None  Collection Time: 08/18/17 11:04 PM  Result Value Ref Range Status   MRSA by PCR NEGATIVE NEGATIVE Final    Comment:        The GeneXpert MRSA Assay (FDA approved for NASAL specimens only), is one component of a comprehensive MRSA colonization surveillance program. It is not intended to diagnose MRSA infection nor to guide or monitor treatment for MRSA infections.          Radiology Studies: Ct Abdomen Pelvis Wo Contrast  Result Date: 08/18/2017 CLINICAL DATA:  Weakness and diarrhea since May 2018, metastatic malignant carcinoid tumor, post gastrojejunostomy and partial small-bowel resection on 08/02/2017, near syncope today ; history coronary artery disease, type II diabetes mellitus, essential hypertension, peripheral vascular disease EXAM: CT ABDOMEN AND PELVIS WITHOUT CONTRAST TECHNIQUE: Multidetector CT imaging of the abdomen and pelvis was performed following the standard protocol without IV contrast. Sagittal and coronal MPR images reconstructed from axial data  set. COMPARISON:  07/28/2017 FINDINGS: Lower chest: Mild atelectasis at lung bases. Tiny nodular foci at lung bases unchanged. Hepatobiliary: Liver unremarkable.  Gallbladder unremarkable. Pancreas: Normal appearance Spleen: Normal appearance Adrenals/Urinary Tract: Adrenal glands normal appearance. BILATERAL nonobstructing renal calculi largest LEFT kidney 12 mm diameter image 46. No definite hydronephrosis or hydroureter. Bladder unremarkable. Stomach/Bowel: Prior gastrojejunostomy. Stomach incompletely distended, limiting assessment of wall thickness but appears minimally thickened. Diffuse thickening of the proximal jejunum as well as additional scattered bowel loops, could be postsurgical though wall thickening can also be seen with infection and ischemia. Significant amount of associated free intraperitoneal air and fluid is atypical for 16 days post abdominal surgery and consistent with perforated viscus. A discrete anastomotic leak is not seen. Source of perforation is not localized. Colon decompressed without wall thickening. Vascular/Lymphatic: Extensive atherosclerotic calcifications of aorta, abdominal branch vessels common iliac arteries and femoral arteries. Coronary retro calcifications noted. Reproductive: Few prostatic calcifications, nonspecific Other: Tiny umbilical hernia. Significant free intraperitoneal air and free fluid as above. Scattered edema of intra-abdominal and subcutaneous tissue planes. Scattered mesenteric edema with again identified calcified mesenteric mass within the small bowel mesenteries compatible with known carcinoid tumor. Additional partially calcified soft tissue density is seen LEFT para-aortic which may represent either adenopathy or direct tumor extension, Musculoskeletal: Osseous demineralization with degenerative changes of the lumbar spine. IMPRESSION: Extensive free air in free fluid, atypical for 16 days post abdominal surgery compatible with a perforated viscus.  Site of perforation is uncertain, without discrete localization of a bowel perforation or anastomotic leak by CT. Bowel wall thickening at the gastrojejunostomy anastomosis but as well throughout multiple small bowel loops in the upper abdomen and pelvis. In light of the patient's extensive atherosclerotic disease and identification of ischemic bowel at surgery, findings are concerning for small bowel ischemia though wall thickening can also be seen with prolonged handling of bowel at surgery, infection, and inflammatory bowel disease. Known partially calcified mass within the mesenteric compatible with carcinoid tumor with tumor extension versus adenopathy LEFT para-aortic. Critical Value/emergent results were called by telephone at the time of interpretation on 08/18/2017 at 2:47 pm to Dr. Curlene Labrum, who verbally acknowledged these results. Electronically Signed   By: Lavonia Dana M.D.   On: 08/18/2017 14:49   Dg Chest Port 1 View  Result Date: 08/18/2017 CLINICAL DATA:  Central line placement EXAM: PORTABLE CHEST 1 VIEW COMPARISON:  08/18/2017 1150 hours FINDINGS: Lungs are under aerated with bibasilar atelectasis. Right subclavian central venous catheter place. Tip is at the cavoatrial junction. No evidence of pneumothorax. Normal  heart size. IMPRESSION: Right subclavian vein central venous catheter placed with its tip at the cavoatrial junction and no pneumothorax Bibasilar atelectasis and low lung volumes. Electronically Signed   By: Marybelle Killings M.D.   On: 08/18/2017 17:03   Dg Chest Port 1v Same Day  Result Date: 08/18/2017 CLINICAL DATA:  Status post intubation. History of bowel cancer, DM, HTN, A-FIB, Renal disease, stroke, coronary atherosclerosis. EXAM: PORTABLE CHEST 1 VIEW COMPARISON:  08/18/2017 FINDINGS: Status post placement of endotracheal tube, tip approximately 5.5 cm above the carina. Nasogastric tube is in place with tip beyond the image beyond the gastroesophageal junction. A  right subclavian central line tip overlies the level of superior vena cava. The heart size is normal. There is minimal right lower lobe atelectasis and probable trace effusion. IMPRESSION: Interval placement of endotracheal tube, and nasogastric tube. Small right pleural effusion and basilar atelectasis. Electronically Signed   By: Nolon Nations M.D.   On: 08/18/2017 21:09   Dg Abd Acute W/chest  Result Date: 08/18/2017 CLINICAL DATA:  Diarrhea, weakness. EXAM: DG ABDOMEN ACUTE W/ 1V CHEST COMPARISON:  Radiographs of August 07, 2017. CT scan of July 28, 2017. FINDINGS: No abnormal bowel gas pattern is noted. Left nephrolithiasis is noted. Phleboliths are noted in the pelvis. Midline surgical staples are noted pneumoperitoneum is noted under both hemidiaphragms which may be due to patient's postoperative status. Heart size and mediastinal contours are within normal limits. Both lungs are clear. IMPRESSION: No evidence of bowel obstruction or ileus. No acute cardiopulmonary disease. Left nephrolithiasis is noted. Pneumoperitoneum is noted under both hemidiaphragms which may be due to patient's postoperative status, although according to ordering physician, this procedure was 15 days ago. The amount of air that remain is concerning for possible rupture of hollow viscus. Unenhanced CT scan of the abdomen pelvis may be performed for further evaluation. Critical Value/emergent results were called by telephone at the time of interpretation on 08/18/2017 at 12:31 pm to Dr. Orlie Dakin , who verbally acknowledged these results. Electronically Signed   By: Marijo Conception, M.D.   On: 08/18/2017 12:31        Scheduled Meds: . chlorhexidine gluconate (MEDLINE KIT)  15 mL Mouth Rinse BID  . insulin aspart  0-9 Units Subcutaneous Q4H  . mouth rinse  15 mL Mouth Rinse QID  . pantoprazole (PROTONIX) IV  40 mg Intravenous Daily   Continuous Infusions: . sodium chloride 125 mL/hr at 08/19/17 0609  .  fentaNYL infusion INTRAVENOUS 75 mcg/hr (08/19/17 0241)  . norepinephrine (LEVOPHED) Adult infusion 10 mcg/min (08/19/17 0618)  . piperacillin-tazobactam (ZOSYN)  IV 3.375 g (08/19/17 0615)  .  sodium bicarbonate  infusion 1000 mL 100 mL/hr at 08/18/17 2355  . vasopressin (PITRESSIN) infusion - *FOR SHOCK* 0.03 Units/min (08/18/17 2221)     LOS: 1 day    Critical care time- 40 minutes    Loretha Stapler, MD [facility] Pager [phone]  If 7PM-7AM, please contact night-coverage [email or web address] Password [facility] 08/19/2017, 8:28 AM

## 2017-08-20 LAB — PREPARE FRESH FROZEN PLASMA
UNIT DIVISION: 0
UNIT DIVISION: 0
UNIT DIVISION: 0
Unit division: 0
Unit division: 0

## 2017-08-20 LAB — TYPE AND SCREEN
ABO/RH(D): O POS
Antibody Screen: NEGATIVE
UNIT DIVISION: 0
UNIT DIVISION: 0
UNIT DIVISION: 0
Unit division: 0
Unit division: 0

## 2017-08-20 LAB — COMPREHENSIVE METABOLIC PANEL
ALBUMIN: 1.9 g/dL — AB (ref 3.5–5.0)
ALT: 43 U/L (ref 17–63)
AST: 67 U/L — AB (ref 15–41)
Alkaline Phosphatase: 58 U/L (ref 38–126)
Anion gap: 9 (ref 5–15)
BUN: 69 mg/dL — AB (ref 6–20)
CHLORIDE: 104 mmol/L (ref 101–111)
CO2: 23 mmol/L (ref 22–32)
Calcium: 6.8 mg/dL — ABNORMAL LOW (ref 8.9–10.3)
Creatinine, Ser: 2.99 mg/dL — ABNORMAL HIGH (ref 0.61–1.24)
GFR calc Af Amer: 22 mL/min — ABNORMAL LOW (ref >60)
GFR calc non Af Amer: 19 mL/min — ABNORMAL LOW (ref >60)
GLUCOSE: 166 mg/dL — AB (ref 65–99)
POTASSIUM: 3.4 mmol/L — AB (ref 3.5–5.1)
SODIUM: 136 mmol/L (ref 135–145)
Total Bilirubin: 0.6 mg/dL (ref 0.3–1.2)
Total Protein: 4.4 g/dL — ABNORMAL LOW (ref 6.5–8.1)

## 2017-08-20 LAB — BLOOD GAS, ARTERIAL
Acid-Base Excess: 0.6 mmol/L (ref 0.0–2.0)
Acid-Base Excess: 1.1 mmol/L (ref 0.0–2.0)
Acid-base deficit: 0.4 mmol/L (ref 0.0–2.0)
Acid-base deficit: 0.7 mmol/L (ref 0.0–2.0)
BICARBONATE: 24.5 mmol/L (ref 20.0–28.0)
Bicarbonate: 25.4 mmol/L (ref 20.0–28.0)
Bicarbonate: 25.8 mmol/L (ref 20.0–28.0)
Bicarbonate: 24 mmol/L (ref 20.0–28.0)
DRAWN BY: 221791
DRAWN BY: 221791
DRAWN BY: 28459
Drawn by: 105551
Expiratory PAP: 5
FIO2: 40
FIO2: 40
FIO2: 40
FIO2: 40
LHR: 12 {breaths}/min
LHR: 12 {breaths}/min
MECHVT: 580 mL
MECHVT: 500 mL
Mode: POSITIVE
O2 SAT: 99.6 %
O2 SAT: 99.1 %
O2 Saturation: 99.4 %
O2 Saturation: 99.5 %
PATIENT TEMPERATURE: 37
PATIENT TEMPERATURE: 37
PCO2 ART: 26.9 mmHg — AB (ref 32.0–48.0)
PCO2 ART: 32.3 mmHg (ref 32.0–48.0)
PCO2 ART: 36.4 mmHg (ref 32.0–48.0)
PEEP/CPAP: 5 cmH2O
PEEP: 5 cmH2O
PEEP: 5 cmH2O
PH ART: 7.541 — AB (ref 7.350–7.450)
PH ART: 7.465 — AB (ref 7.350–7.450)
PO2 ART: 183 mmHg — AB (ref 83.0–108.0)
Pressure support: 15 cmH2O
RATE: 16 resp/min
VT: 600 mL
pCO2 arterial: 27.6 mmHg — ABNORMAL LOW (ref 32.0–48.0)
pH, Arterial: 7.542 — ABNORMAL HIGH (ref 7.350–7.450)
pH, Arterial: 7.42 (ref 7.350–7.450)
pO2, Arterial: 187 mmHg — ABNORMAL HIGH (ref 83.0–108.0)
pO2, Arterial: 195 mmHg — ABNORMAL HIGH (ref 83.0–108.0)
pO2, Arterial: 184 mmHg — ABNORMAL HIGH (ref 83.0–108.0)

## 2017-08-20 LAB — PROTIME-INR
INR: 2.18
INR: 2.8
PROTHROMBIN TIME: 24.1 s — AB (ref 11.4–15.2)
Prothrombin Time: 29.3 seconds — ABNORMAL HIGH (ref 11.4–15.2)

## 2017-08-20 LAB — BPAM FFP
BLOOD PRODUCT EXPIRATION DATE: 201812032359
BLOOD PRODUCT EXPIRATION DATE: 201812042359
Blood Product Expiration Date: 201812042359
Blood Product Expiration Date: 201812032359
Blood Product Expiration Date: 201812032359
ISSUE DATE / TIME: 201811291805
ISSUE DATE / TIME: 201811281951
ISSUE DATE / TIME: 201811282316
ISSUE DATE / TIME: 201811290032
ISSUE DATE / TIME: 201811291525
UNIT TYPE AND RH: 5100
UNIT TYPE AND RH: 5100
UNIT TYPE AND RH: 5100
UNIT TYPE AND RH: 5100
Unit Type and Rh: 5100

## 2017-08-20 LAB — GLUCOSE, CAPILLARY
GLUCOSE-CAPILLARY: 160 mg/dL — AB (ref 65–99)
GLUCOSE-CAPILLARY: 172 mg/dL — AB (ref 65–99)
Glucose-Capillary: 137 mg/dL — ABNORMAL HIGH (ref 65–99)
Glucose-Capillary: 147 mg/dL — ABNORMAL HIGH (ref 65–99)
Glucose-Capillary: 150 mg/dL — ABNORMAL HIGH (ref 65–99)
Glucose-Capillary: 153 mg/dL — ABNORMAL HIGH (ref 65–99)
Glucose-Capillary: 186 mg/dL — ABNORMAL HIGH (ref 65–99)

## 2017-08-20 LAB — BPAM RBC
BLOOD PRODUCT EXPIRATION DATE: 201812262359
BLOOD PRODUCT EXPIRATION DATE: 201812262359
BLOOD PRODUCT EXPIRATION DATE: 201812262359
Blood Product Expiration Date: 201812262359
Blood Product Expiration Date: 201812262359
ISSUE DATE / TIME: 201811281741
ISSUE DATE / TIME: 201811281909
ISSUE DATE / TIME: 201811292001
ISSUE DATE / TIME: 201811281637
ISSUE DATE / TIME: 201811292243
UNIT TYPE AND RH: 5100
UNIT TYPE AND RH: 5100
UNIT TYPE AND RH: 5100
Unit Type and Rh: 5100
Unit Type and Rh: 5100

## 2017-08-20 LAB — HEMOGLOBIN AND HEMATOCRIT, BLOOD
HEMATOCRIT: 23.6 % — AB (ref 39.0–52.0)
HEMATOCRIT: 22.4 % — AB (ref 39.0–52.0)
HEMOGLOBIN: 7.6 g/dL — AB (ref 13.0–17.0)
Hemoglobin: 8 g/dL — ABNORMAL LOW (ref 13.0–17.0)

## 2017-08-20 LAB — FIBRINOGEN: Fibrinogen: 511 mg/dL — ABNORMAL HIGH (ref 210–475)

## 2017-08-20 LAB — PHOSPHORUS: Phosphorus: 4.3 mg/dL (ref 2.5–4.6)

## 2017-08-20 LAB — MAGNESIUM: Magnesium: 1.5 mg/dL — ABNORMAL LOW (ref 1.7–2.4)

## 2017-08-20 LAB — TROPONIN I: TROPONIN I: 0.3 ng/mL — AB (ref <0.03)

## 2017-08-20 MED ORDER — MAGNESIUM SULFATE 2 GM/50ML IV SOLN
2.0000 g | INTRAVENOUS | Status: AC
  Administered 2017-08-20 (×2): 2 g via INTRAVENOUS
  Filled 2017-08-20 (×2): qty 50

## 2017-08-20 MED ORDER — MAGNESIUM SULFATE 4 GM/100ML IV SOLN: 4.0000 g | Freq: Once | INTRAVENOUS | Status: DC

## 2017-08-20 MED ORDER — SODIUM CHLORIDE 0.9 % IV SOLN
INTRAVENOUS | Status: DC
  Administered 2017-08-20 – 2017-08-24 (×7): via INTRAVENOUS

## 2017-08-20 MED ORDER — NOREPINEPHRINE 4 MG/250ML-% IV SOLN
INTRAVENOUS | Status: AC
  Filled 2017-08-20: qty 250

## 2017-08-20 MED ORDER — LIDOCAINE HCL (PF) 1 % IJ SOLN
INTRAMUSCULAR | Status: AC
  Administered 2017-08-20: 12:00:00
  Filled 2017-08-20: qty 5

## 2017-08-20 NOTE — Progress Notes (Signed)
I  begin to wean Stephen [name]  with settings of PRVC8/ 550/ 40%/ PEEP5, he was able to maintain on these settings for thirty minutes.  His respirations where 15 over set amount on those settings and vitals WNL so, he was changed to CPAP5/PS10 with 40%. After approximately ten minutes respirations increased and he communicated it was harder to breath so ventilator settings  Were changed to CPAP5/PS15. After approximately ten minutes the patients heart rate and respirations increased with a decrease in tidal volumes. He communicated it was harder to breath and that he was tired and wanted to rest. Stephen [name] was returned to a rest mode of PRVC12/ 580/ 40/ PEEP5  and his Fentanyl was increased from 75 mcg/hr back to 150 mcg/hr. The hope is for the patient to rest and attempt to wean again tomorrow.

## 2017-08-20 NOTE — Progress Notes (Signed)
[facility]  Conversation had with Elenor Legato (Robin's husband), Corene Cornea, and Opal Sidles regarding Mr. Eshbach and event of today. Discussed this with Quinn Axe.  Neuro- patient awake on fentanyl, 11T when asked questions he responds with yes/ no nods, when asked about removing tube to provide comfort he did not answer Resp- Respiratory alkalosis on vent after metabolic acidosis has been corrected with normal HCO3, tried PSV but patient too weak and volumes sporadic, placed back on PRVR with decreased rate and volume to correct alkalosis  Cardiac- Hypotensive on Levophed and vasopressin GI- Ostomy viable, NG, NPO, will likely have short gut given amount of resection  GU- CKD with acute insufficiency, Cr 3 and low UOP, replaced Mg  Heme/ID- ON zosyn for possible intraabdominal infection, peritoneal fluid has not grown any bacteria to date; coagulopathic with post operative acute blood loss anemia, not responding to FFP and slowly drifting down H&H, not overtly bleeding but just generally coagulopathic without any improvement after 4 RBC, 4 FFP, and platelets, do not see utility in giving more as his liver likely has decreased synthetic function and he is consuming what we give him at this point; Eliqiuis will have been out of his system   Ordered labs for the AM. Can continue with reduce rate and volume, ABG for AM   Discussed above and fact that patient has multisystem organ failure with respiratory, cardiac with hypotension, GI, GU, liver/ hematology dysfunction, and possibility for eventual further cardiac dysfunction. Discussed that do not see utility in further blood products as we are unable to keep up with his coagulopathy and there is no overt signs of bleeding. Discussed that patient does not want his breathing tube removed at this time for comfort, and that medically he is not ready for [place] to pull without him / family wanting it pulled. Discussed that he could be on ventilator for  several days and that a tracheostomy is not appropriate in this setting with his prognosis and prior wishes for not living on a machine. Discussed patient's appearance of stress and refusal to shut his eyes and family support for this and for him.    Do not think care should be escalated at this time. Will continue current care, and will see how we progress in the next 24-48 hrs. Discussed that if he does become medically able to get extubated that he could ultimately fail and would not get reintuabted and would lead to death.  Discussed if patient/family decides that extubation is best for them that we will respect their wishes.   Family expressed understanding. If patient passes, I would like to be notified regardless of hour.   Curlene Labrum, MD [facility] [address] [place], [place] [address] [phone] (office)

## 2017-08-20 NOTE — Progress Notes (Signed)
Changed ventilator settings per last ABG. Follow up ABG will be collected later

## 2017-08-20 NOTE — Progress Notes (Addendum)
[facility] Progress Note  2 Days Post-Op  Subjective: Weaning on the pressors and down to 2 of levophed.  Awake and interactive. ABG not yet drawn today. Remains coagulopathic but not dropping H&H drastically just a gradual decline.   Objective: Vital signs in last 24 hours: Temp:  [97.2 F (36.2 C)-98.7 F (37.1 C)] 97.5 F (36.4 C) (11/30 0824) Pulse Rate:  [74-114] 93 (11/30 1200) Resp:  [15-32] 19 (11/30 1200) BP: (65-146)/(48-102) 120/91 (11/30 1200) SpO2:  [91 %-100 %] 100 % (11/30 1200) Arterial Line BP: (77-163)/(34-65) 139/55 (11/30 1200) FiO2 (%):  [40 %] 40 % (11/30 0824) Weight:  [208 lb 5.4 oz (94.5 kg)] 208 lb 5.4 oz (94.5 kg) (11/30 0500) Last BM Date: 08/19/17  Intake/Output from previous day: 11/29 0701 - 11/30 0700 In: 6095.8 [I.V.:5329.8; Blood:616; IV Piggyback:150] Out: 2765 [Urine:570; Emesis/NG output:650; Drains:1270; Stool:275] Intake/Output this shift: Total I/O In: 639.3 [I.V.:639.3] Out: -   General appearance: alert, no distress and 11T Resp: vent, regular work of breathing GI: soft, nontender, midline with staples c/d/i with no erythema or drainage, JP with SS output and output around the JP, ostomy pink and healthy but sloughing mucosa within the bag, retracting some into folds Extremities: warm, moves    Lab Results:  Recent Labs    08/19/17 0401  08/19/17 1613 08/20/17 0216 08/20/17 1124  WBC 16.4*  --  13.2*  --   --   HGB 7.5*   < > 6.6* 8.0* 7.6*  HCT 23.5*   < > 20.3* 23.6* 22.4*  PLT 212  --  183  --   --    < > = values in this interval not displayed.   BMET Recent Labs    08/19/17 1613 08/20/17 1124  NA 135 136  K 3.8 3.4*  CL 107 104  CO2 17* 23  GLUCOSE 264* 166*  BUN 77* 69*  CREATININE 3.09* 2.99*  CALCIUM 6.7* 6.8*   PT/INR Recent Labs    08/19/17 0726 08/20/17 0216  LABPROT 27.0* 24.1*  INR 2.52 2.18    Anti-infectives: Anti-infectives (From admission, onward)   Start      Dose/Rate Route Frequency Ordered Stop   08/19/17 0600  cefoTEtan in Dextrose 5% (CEFOTAN) IVPB 2 g     2 g Intravenous On call to O.R. 08/18/17 1522 08/18/17 1720   08/18/17 1445  piperacillin-tazobactam (ZOSYN) IVPB 3.375 g  Status:  Discontinued     3.375 g 100 mL/hr over 30 Minutes Intravenous Every 6 hours 08/18/17 1432 08/18/17 1439   08/18/17 1445  piperacillin-tazobactam (ZOSYN) IVPB 3.375 g     3.375 g 12.5 mL/hr over 240 Minutes Intravenous Every 8 hours 08/18/17 1439        Assessment/Plan: Stephen [name] is a 76 yo POD 2 s/p Ex lap, SBR and right hemicolectomy, end jejunostomy with likely only 150cm of small bowel remaining after ischemic bowel from his carcinoid tumor causing SMA compression.  He remains critical on the ventilator and with decreasing pressors. He continues have a coagulopathy but his liver enzymes are not elevated making shock liver less likely. He is oozing SS output from around the JP and it fells up as soon as it is emptied, indicating ascites has recollected already.  This ascites is likely related to his low albumin and general deconditioned state versus a malignant ascites.  -ABG ordered and pending, will see if can be weaned  -INR overnight 2.18 and H&H drifting down again, rechecking INF and  Fibrinogen today -Will likely need more transfusion pending the families decisions on continued care  -If we decide to transfuse will need to get to an INR for at least 1.5 or less  -Acute blood loss anemia post operatively and additional coagulopathy  -Added Mg and Phos to labs to check for replacement needs, Ca corrected for the albumin of 1.9 is 7.7 -Peritoneal cavity ascites with no growth to date, continue zosyn for now  -HCO3 has normalized to 23, have stopped HCO3 gtt and ordered NS  -DNR  -Hold any anticoagulation given coagulopathy  -Stitch around JP to help prevent leakage.  -Palliative care working with the patient also   -Will discuss with Dr. Adair Patter and  Stephen Axe NP.    LOS: 2 days    Stephen [name] 08/20/2017

## 2017-08-20 NOTE — Plan of Care (Signed)
Attended family meeting with Dr. Constance Haw, daughter, son-in-law, grandson and significant other in family room.  Severity of Mr. Gagen current condition explained.  Questions answered.  Emotional support given. No charge

## 2017-08-20 NOTE — Progress Notes (Signed)
PROGRESS NOTE    Stephen [name]  DXA:[id number] DOB: 1941-03-21 DOA: 08/18/2017 PCP: [name], MD    Brief Narrative:  Stephen [name] is a 76 y.o. male with medical history significant of metastici carcinoid tumor who recently underwent gastrojejunostomy and small bowel resection 16 days ago, CKF Stage III, Atrial fibrillation on eliquis presented to the ED with syncope.  Per Dr. Constance Haw' History and physical patient voiced he was having slightly decreased PO intake and diarrhea.  He underwent CT scan of the abdomen and began having abdominal pain.  No further history obtainable as patient in significant pain and prepping for surgery.  ED Course: Patient found to be hypotensive- received 2L boluses.  General surgeon Dr. Constance Haw evaluated after CT scan results showing free air, bowel wall thickening and concern for ischemia. Of note his labs show WBC of 8.5, H/H 8.8/28.6, Plt of 224.  Creatinine of 2.97 (baseline appears to be between 2.2-2.3). Patient to be admitted to ICU and to undergo surgery emergently.  Patient underwent Palliative exploratory laparotomy, distal small bowel resection (150cm) and right hemicolectomy, drain placement around the gastrojejunostomy  on 08/18/2017.  During his surgical course in the operating room he developed numerous cardiac arrhythmias as well as hypotension which was treated with a 3 unit PRBC transfusion as well as numerous pressure medications he was transferred to the ICU in critical condition.  He remained intubated and on 08/19/2017 palliative care was consulted for further discussion with patient's family about goals of care.  Patient's family states that they would like to wait the next 24-48 hours to see how patient progresses following his surgery.     Assessment & Plan:   Principal Problem:   Intra-abdominal free air of unknown etiology Active Problems:   Diabetes mellitus, type II (HCC)   Essential hypertension   Hyperlipidemia   CKD  (chronic kidney disease) stage 3, GFR 30-59 ml/min (HCC)   Metastatic malignant carcinoid tumor to liver (HCC)   Pressure injury of skin   Coagulopathy (HCC)   Anemia   Intra abdominal free air s/p Palliative exploratory laparotomy, distal small bowel resection (150cm) and right hemicolectomy, drain placement around the gastrojejunostomy  -Received 3 units of PRBCs in the operating room and additional 2 units of PRBC transfusion yesterday -Received FFP in the operating room and  2 units of FFP today -Patient remains on vasopressin and levophed -Attempt to wean pressors last night resulted in patient becoming hypotensive and therefore re-titration to achieve a map of greater than 65 - Attempting to wean levophed today -Patient remains on bicarb drip -Patient remains on ventilator -Patient in critical condition -Discussed with Dr. Constance Haw and do not feel as though escalation of care at this point would be appropriate if patient were to further deteriorate -INR increased up to 2.8 despite numerous infusions of FFP -Patient failed weaning trial and is therefore not medically appropriate for extubation -Patient denies wanting palliative extubation at this time  Atrial fibrillation -Hold blood thinners -Patient remains in atrial fibrillation  Elevated troponin -Likely in the setting of hypotension as well as acute renal failure  Coagulopathy -INR remains elevated -Repeat infusion of FFP not medically appropriate given patient's coagulopathy and lack of response to previous infusions   CKD Creatinine of 2.99  Diabetes Mellitus Type II - CBG monitoring ACHS - SSI - Hyperglycemia  HLD - holding statin     DVT prophylaxis: none Code Status:DNR Family Communication: Patient's son bedside Disposition Plan: guarded; at this time given  patient's numerous medical problems further escalation in care would not be appropriate.  Discussed with Dr. Constance Haw and will wait the next  24-48 hours to see how patient responds.  He failed his extubation weaning trial today and did not want a palliative extubation therefore we will continue current treatment course   Consultants:   General Surgery  Palliative Care  Procedures:  Palliative exploratory laparotomy, distal small bowel resection (150cm) and right hemicolectomy, drain placement around the gastrojejunostomy   Antimicrobials:   Cefotetan    Subjective: Patient is intubated but is able to open eyes and answer yes no questions with shaking of his head.  Failed extubation trial today  Objective: Vitals:   08/20/17 1415 08/20/17 1420 08/20/17 1430 08/20/17 1444  BP: (!) 100/54  (!) 104/91   Pulse: 93  93   Resp: (!) 25  (!) 31   Temp:      TempSrc:      SpO2: 100% 100% 100%   Weight:      Height:    6' (1.829 m)    Intake/Output Summary (Last 24 hours) at 08/20/2017 1507 Last data filed at 08/20/2017 1400 Gross per 24 hour  Intake 5848.9 ml  Output 2425 ml  Net 3423.9 ml   Filed Weights   08/18/17 2037 08/19/17 0500 08/20/17 0500  Weight: 86 kg (189 lb 9.5 oz) 89.5 kg (197 lb 5 oz) 94.5 kg (208 lb 5.4 oz)    Examination:  General exam: Appears calm and comfortable  Respiratory system: On ventilator Cardiovascular system: S1 & S2 heard, RRR. No JVD, murmurs, rubs, gallops or clicks. No pedal edema. Gastrointestinal system: Bandage over mid abdomen with serosanguineous drainage appreciated on bandage as well as sheets.  NG tube in place with copious bilious liquid in cannister Central nervous system: Attempting to speak even though intubated but unable to understand what patient is trying to say Extremities: Able to move arms and hands bilaterally as patient is trying to pull out tube Skin: Surgical site covered with bandage Psychiatry: Unable to evaluate    Data Reviewed: I have personally reviewed following labs and imaging studies  CBC: Recent Labs  Lab 08/16/17 0824 08/18/17 1131  08/18/17 2240 08/19/17 0401 08/19/17 0718 08/19/17 1613 08/20/17 0216 08/20/17 1124  WBC 9.3 8.5 12.7* 16.4*  --  13.2*  --   --   NEUTROABS 6.3 7.4 10.9*  --   --   --   --   --   HGB 9.3* 8.8* 8.6* 7.5* 7.2* 6.6* 8.0* 7.6*  HCT 30.5* 28.6* 27.4* 23.5* 22.2* 20.3* 23.6* 22.4*  MCV 97.8 97.6 92.3 91.8  --  88.6  --   --   PLT 263 224 188 212  --  183  --   --    Basic Metabolic Panel: Recent Labs  Lab 08/18/17 1131 08/18/17 2240 08/19/17 0401 08/19/17 1613 08/20/17 1124 08/20/17 1130  NA 133* 132* 134* 135 136  --   K 4.4 4.5 4.1 3.8 3.4*  --   CL 103 111 109 107 104  --   CO2 22 10* 13* 17* 23  --   GLUCOSE 135* 215* 230* 264* 166*  --   BUN 94* 77* 78* 77* 69*  --   CREATININE 2.97* 2.61* 2.86* 3.09* 2.99*  --   CALCIUM 8.3* 6.7* 7.0* 6.7* 6.8*  --   MG  --  1.5*  --   --   --  1.5*  PHOS  --  5.7*  --   --   --  4.3   GFR: Estimated Creatinine Clearance: 25.1 mL/min (A) (by C-G formula based on SCr of 2.99 mg/dL (H)). Liver Function Tests: Recent Labs  Lab 08/18/17 1131 08/19/17 0401 08/20/17 1124  AST 12* 53* 67*  ALT 9* 24 43  ALKPHOS 65 50 58  BILITOT 0.6 1.1 0.6  PROT 6.0* 4.4* 4.4*  ALBUMIN 2.8* 2.2* 1.9*   No results for input(s): LIPASE, AMYLASE in the last 168 hours. No results for input(s): AMMONIA in the last 168 hours. Coagulation Profile: Recent Labs  Lab 08/18/17 2240 08/19/17 0726 08/20/17 0216 08/20/17 1124  INR 2.30 2.52 2.18 2.80   Cardiac Enzymes: Recent Labs  Lab 08/18/17 2240 08/19/17 0718 08/19/17 1613 08/19/17 2205 08/20/17 0338  TROPONINI 0.03* 0.48* 0.62* 0.38* 0.30*   BNP (last 3 results) No results for input(s): PROBNP in the last 8760 hours. HbA1C: No results for input(s): HGBA1C in the last 72 hours. CBG: Recent Labs  Lab 08/19/17 1959 08/20/17 0012 08/20/17 0338 08/20/17 0713 08/20/17 1136  GLUCAP 219* 186* 160* 150* 153*   Lipid Profile: No results for input(s): CHOL, HDL, LDLCALC, TRIG, CHOLHDL,  LDLDIRECT in the last 72 hours. Thyroid Function Tests: No results for input(s): TSH, T4TOTAL, FREET4, T3FREE, THYROIDAB in the last 72 hours. Anemia Panel: No results for input(s): VITAMINB12, FOLATE, FERRITIN, TIBC, IRON, RETICCTPCT in the last 72 hours. Sepsis Labs: No results for input(s): PROCALCITON, LATICACIDVEN in the last 168 hours.  Recent Results (from the past 240 hour(s))  Aerobic/Anaerobic Culture (surgical/deep wound)     Status: None (Preliminary result)   Collection Time: 08/18/17  6:24 PM  Result Value Ref Range Status   Specimen Description PERITONEAL CAVITY  Final   Special Requests NONE  Final   Gram Stain   Final    RARE WBC PRESENT, PREDOMINANTLY PMN NO ORGANISMS SEEN    Culture   Final    NO GROWTH < 24 HOURS Performed at [facility], [place] [address], [place], [place] [address]    Report Status PENDING  Incomplete  MRSA PCR Screening     Status: None   Collection Time: 08/18/17 11:04 PM  Result Value Ref Range Status   MRSA by PCR NEGATIVE NEGATIVE Final    Comment:        The GeneXpert MRSA Assay (FDA approved for NASAL specimens only), is one component of a comprehensive MRSA colonization surveillance program. It is not intended to diagnose MRSA infection nor to guide or monitor treatment for MRSA infections.          Radiology Studies: Dg Chest Port 1 View  Result Date: 08/18/2017 CLINICAL DATA:  Central line placement EXAM: PORTABLE CHEST 1 VIEW COMPARISON:  08/18/2017 1150 hours FINDINGS: Lungs are under aerated with bibasilar atelectasis. Right subclavian central venous catheter place. Tip is at the cavoatrial junction. No evidence of pneumothorax. Normal heart size. IMPRESSION: Right subclavian vein central venous catheter placed with its tip at the cavoatrial junction and no pneumothorax Bibasilar atelectasis and low lung volumes. Electronically Signed   By: Marybelle Killings M.D.   On: 08/18/2017 17:03   Dg Chest Port 1v Same  Day  Result Date: 08/18/2017 CLINICAL DATA:  Status post intubation. History of bowel cancer, DM, HTN, A-FIB, Renal disease, stroke, coronary atherosclerosis. EXAM: PORTABLE CHEST 1 VIEW COMPARISON:  08/18/2017 FINDINGS: Status post placement of endotracheal tube, tip approximately 5.5 cm above the carina. Nasogastric tube is in place with tip beyond the image beyond the gastroesophageal junction. A right  subclavian central line tip overlies the level of superior vena cava. The heart size is normal. There is minimal right lower lobe atelectasis and probable trace effusion. IMPRESSION: Interval placement of endotracheal tube, and nasogastric tube. Small right pleural effusion and basilar atelectasis. Electronically Signed   By: Nolon Nations M.D.   On: 08/18/2017 21:09        Scheduled Meds: . chlorhexidine gluconate (MEDLINE KIT)  15 mL Mouth Rinse BID  . insulin aspart  0-9 Units Subcutaneous Q4H  . mouth rinse  15 mL Mouth Rinse QID  . pantoprazole (PROTONIX) IV  40 mg Intravenous Daily   Continuous Infusions: . sodium chloride 100 mL/hr at 08/20/17 1344  . fentaNYL infusion INTRAVENOUS 150 mcg/hr (08/20/17 1444)  . magnesium sulfate 1 - 4 g bolus IVPB Stopped (08/20/17 1550)  . norepinephrine (LEVOPHED) Adult infusion 1 mcg/min (08/20/17 1400)  . piperacillin-tazobactam (ZOSYN)  IV 3.375 g (08/20/17 1344)  . vasopressin (PITRESSIN) infusion - *FOR SHOCK* 0.03 Units/min (08/19/17 1952)     LOS: 2 days    Critical care time- 45 minutes    Loretha Stapler, MD [facility] Pager [phone]  If 7PM-7AM, please contact night-coverage [email or web address] Password [email or web address] 08/20/2017, 3:07 PM

## 2017-08-21 LAB — PROTIME-INR
INR: 3.04
Prothrombin Time: 31.2 seconds — ABNORMAL HIGH (ref 11.4–15.2)

## 2017-08-21 LAB — GLUCOSE, CAPILLARY
Glucose-Capillary: 137 mg/dL — ABNORMAL HIGH (ref 65–99)
Glucose-Capillary: 117 mg/dL — ABNORMAL HIGH (ref 65–99)
Glucose-Capillary: 129 mg/dL — ABNORMAL HIGH (ref 65–99)
Glucose-Capillary: 125 mg/dL — ABNORMAL HIGH (ref 65–99)
Glucose-Capillary: 133 mg/dL — ABNORMAL HIGH (ref 65–99)

## 2017-08-21 LAB — CBC WITH DIFFERENTIAL/PLATELET
BASOS ABS: 0 10*3/uL (ref 0.0–0.1)
BASOS PCT: 0 %
Eosinophils Absolute: 0 10*3/uL (ref 0.0–0.7)
Eosinophils Relative: 0 %
HEMATOCRIT: 24.2 % — AB (ref 39.0–52.0)
Hemoglobin: 8 g/dL — ABNORMAL LOW (ref 13.0–17.0)
LYMPHS PCT: 6 %
Lymphs Abs: 0.9 10*3/uL (ref 0.7–4.0)
MCH: 29.4 pg (ref 26.0–34.0)
MCHC: 33.1 g/dL (ref 30.0–36.0)
MCV: 89 fL (ref 78.0–100.0)
MONO ABS: 1.1 10*3/uL — AB (ref 0.1–1.0)
Monocytes Relative: 7 %
NEUTROS ABS: 13 10*3/uL — AB (ref 1.7–7.7)
Neutrophils Relative %: 87 %
PLATELETS: 120 10*3/uL — AB (ref 150–400)
RBC: 2.72 MIL/uL — AB (ref 4.22–5.81)
RDW: 17.5 % — AB (ref 11.5–15.5)
WBC: 15 10*3/uL — AB (ref 4.0–10.5)

## 2017-08-21 LAB — COMPREHENSIVE METABOLIC PANEL
ALBUMIN: 1.8 g/dL — AB (ref 3.5–5.0)
ALT: 39 U/L (ref 17–63)
ANION GAP: 8 (ref 5–15)
AST: 41 U/L (ref 15–41)
Alkaline Phosphatase: 79 U/L (ref 38–126)
BILIRUBIN TOTAL: 0.7 mg/dL (ref 0.3–1.2)
BUN: 68 mg/dL — ABNORMAL HIGH (ref 6–20)
CHLORIDE: 106 mmol/L (ref 101–111)
CO2: 22 mmol/L (ref 22–32)
Calcium: 7.2 mg/dL — ABNORMAL LOW (ref 8.9–10.3)
Creatinine, Ser: 2.96 mg/dL — ABNORMAL HIGH (ref 0.61–1.24)
GFR calc Af Amer: 22 mL/min — ABNORMAL LOW (ref >60)
GFR, EST NON AFRICAN AMERICAN: 19 mL/min — AB (ref >60)
Glucose, Bld: 141 mg/dL — ABNORMAL HIGH (ref 65–99)
Potassium: 3.5 mmol/L (ref 3.5–5.1)
Sodium: 136 mmol/L (ref 135–145)
TOTAL PROTEIN: 4.5 g/dL — AB (ref 6.5–8.1)

## 2017-08-21 LAB — BLOOD GAS, ARTERIAL
Acid-base deficit: 0.1 mmol/L (ref 0.0–2.0)
Bicarbonate: 24.7 mmol/L (ref 20.0–28.0)
DRAWN BY: 28459
FIO2: 40
MECHVT: 500 mL
O2 SAT: 99.2 %
PATIENT TEMPERATURE: 37
PCO2 ART: 29.4 mmHg — AB (ref 32.0–48.0)
PEEP: 5 cmH2O
PO2 ART: 193 mmHg — AB (ref 83.0–108.0)
RATE: 12 resp/min
pH, Arterial: 7.501 — ABNORMAL HIGH (ref 7.350–7.450)

## 2017-08-21 LAB — MAGNESIUM: Magnesium: 2 mg/dL (ref 1.7–2.4)

## 2017-08-21 LAB — PHOSPHORUS: PHOSPHORUS: 4.9 mg/dL — AB (ref 2.5–4.6)

## 2017-08-21 MED ORDER — PHENOL 1.4 % MT LIQD
1.0000 | OROMUCOSAL | Status: DC | PRN
  Administered 2017-08-21: 1 via OROMUCOSAL
  Filled 2017-08-21 (×2): qty 177

## 2017-08-21 MED ORDER — MORPHINE SULFATE (PF) 2 MG/ML IV SOLN
2.0000 mg | INTRAVENOUS | Status: DC | PRN
  Administered 2017-08-22 – 2017-08-24 (×11): 2 mg via INTRAVENOUS
  Filled 2017-08-21 (×13): qty 1

## 2017-08-21 MED ORDER — SODIUM CHLORIDE 0.9% FLUSH: 10.0000 mL | INTRAVENOUS | Status: DC | PRN

## 2017-08-21 MED ORDER — CHLORHEXIDINE GLUCONATE CLOTH 2 % EX PADS
6.0000 | MEDICATED_PAD | Freq: Every day | CUTANEOUS | Status: DC
  Administered 2017-08-21 – 2017-08-24 (×4): 6 via TOPICAL

## 2017-08-21 MED ORDER — VASOPRESSIN 20 UNIT/ML IV SOLN
INTRAVENOUS | Status: AC
  Filled 2017-08-21: qty 2

## 2017-08-21 MED ORDER — HYDROMORPHONE HCL 1 MG/ML IJ SOLN: 0.5000 mg | INTRAMUSCULAR | Status: DC | PRN

## 2017-08-21 MED ORDER — SODIUM CHLORIDE 0.9% FLUSH
10.0000 mL | Freq: Two times a day (BID) | INTRAVENOUS | Status: DC
  Administered 2017-08-21: 30 mL
  Administered 2017-08-21 – 2017-08-22 (×3): 10 mL
  Administered 2017-08-23: 30 mL
  Administered 2017-08-23 – 2017-08-24 (×2): 10 mL

## 2017-08-21 NOTE — Progress Notes (Signed)
PROGRESS NOTE    Stephen [name]  VOH:[id number] DOB: 1941-06-14 DOA: 08/18/2017 PCP: [name], MD    Brief Narrative:  Stephen [name] is a 76 y.o. male with medical history significant of metastici carcinoid tumor who recently underwent gastrojejunostomy and small bowel resection 16 days ago, CKF Stage III, Atrial fibrillation on eliquis presented to the ED with syncope.  Per Dr. Constance Haw' History and physical patient voiced he was having slightly decreased PO intake and diarrhea.  He underwent CT scan of the abdomen and began having abdominal pain.  No further history obtainable as patient in significant pain and prepping for surgery.  ED Course: Patient found to be hypotensive- received 2L boluses.  General surgeon Dr. Constance Haw evaluated after CT scan results showing free air, bowel wall thickening and concern for ischemia. Of note his labs show WBC of 8.5, H/H 8.8/28.6, Plt of 224.  Creatinine of 2.97 (baseline appears to be between 2.2-2.3). Patient to be admitted to ICU and to undergo surgery emergently.  Patient underwent Palliative exploratory laparotomy, distal small bowel resection (150cm) and right hemicolectomy, drain placement around the gastrojejunostomy  on 08/18/2017.  During his surgical course in the operating room he developed numerous cardiac arrhythmias as well as hypotension which was treated with a 3 unit PRBC transfusion as well as numerous pressure medications he was transferred to the ICU in critical condition.  He remained intubated and on 08/19/2017 palliative care was consulted for further discussion with patient's family about goals of care.  Patient's family states that they would like to wait the next 24-48 hours to see how patient progresses following his surgery.     Assessment & Plan:   Principal Problem:   Intra-abdominal free air of unknown etiology Active Problems:   Diabetes mellitus, type II ([place])   Essential hypertension   Hyperlipidemia   CKD  (chronic kidney disease) stage 3, GFR 30-59 ml/min (HCC)   Metastatic malignant carcinoid tumor to liver (HCC)   Pressure injury of skin   Coagulopathy (HCC)   Anemia   Acute blood loss anemia   Intra abdominal free air s/p Palliative exploratory laparotomy, distal small bowel resection (150cm) and right hemicolectomy, drain placement around the gastrojejunostomy  -Status post 5 units PRBC transfusion -Status post 3 units of FFP -Patient remains on vasopressin and levophed -Patient very sensitive to weaning of Levophed -Patient remains on bicarb drip -Patient remains on ventilator --INR increased to 3.0 -Patient reports he still does not want extubation at this time but repeatedly is attempting to pull his NG tube  Atrial fibrillation -Hold blood thinners -Patient remains in atrial fibrillation  Elevated troponin -Likely in the setting of hypotension as well as acute renal failure  Coagulopathy -INR increased again this morning to 3.04 -Repeat infusion of FFP not medically appropriate given patient's coagulopathy and lack of response to previous infusions   CKD Creatinine stable at 2.96  Diabetes Mellitus Type II - CBG monitoring ACHS - SSI  HLD - holding statin     DVT prophylaxis: none Code Status:DNR Family Communication: No family is bedside this morning Disposition Plan: guarded; at this time given patient's numerous medical problems further escalation in care would not be appropriate.    Consultants:   General Surgery  Palliative Care  Procedures:  Palliative exploratory laparotomy, distal small bowel resection (150cm) and right hemicolectomy, drain placement around the gastrojejunostomy   Antimicrobials:   Cefotetan    Subjective: Patient is able to nod head yes and no response  to yes and no questions.  He repeatedly nods his head yes when asked if he wants his tube taken out but he is referencing his NG tube.  He nods his head no when asked  if he would like to be extubated.  He understands that if he were to be extubated he would not be reintubated due to his wishes of being a DO NOT RESUSCITATE   Objective: Vitals:   08/21/17 0330 08/21/17 0342 08/21/17 0400 08/21/17 0459  BP:   (!) 134/58   Pulse: 84 69 75   Resp: 20 13 17    Temp:   97.7 F (36.5 C)   TempSrc:   Axillary   SpO2: 100% 100% 100% 100%  Weight:      Height:        Intake/Output Summary (Last 24 hours) at 08/21/2017 0754 Last data filed at 08/21/2017 0646 Gross per 24 hour  Intake 2349.17 ml  Output 2330 ml  Net 19.17 ml   Filed Weights   08/18/17 2037 08/19/17 0500 08/20/17 0500  Weight: 86 kg (189 lb 9.5 oz) 89.5 kg (197 lb 5 oz) 94.5 kg (208 lb 5.4 oz)    Examination:  General exam: Appears calm and comfortable  Respiratory system: On ventilator Cardiovascular system: S1 & S2 heard, RRR. No JVD, murmurs, rubs, gallops or clicks. No pedal edema. Gastrointestinal system: Bandage over mid abdomen clean dry and intact.  NG tube in place with bilious liquid in cannister Central nervous system: Patient is able to nod head yes and no to questions Extremities: Able to move arms and hands bilaterally as patient is trying to pull out tube Skin: Surgical site covered with bandage Psychiatry: Unable to evaluate    Data Reviewed: I have personally reviewed following labs and imaging studies  CBC: Recent Labs  Lab 08/16/17 0824 08/18/17 1131 08/18/17 2240 08/19/17 0401 08/19/17 0718 08/19/17 1613 08/20/17 0216 08/20/17 1124 08/21/17 0405  WBC 9.3 8.5 12.7* 16.4*  --  13.2*  --   --  15.0*  NEUTROABS 6.3 7.4 10.9*  --   --   --   --   --  13.0*  HGB 9.3* 8.8* 8.6* 7.5* 7.2* 6.6* 8.0* 7.6* 8.0*  HCT 30.5* 28.6* 27.4* 23.5* 22.2* 20.3* 23.6* 22.4* 24.2*  MCV 97.8 97.6 92.3 91.8  --  88.6  --   --  89.0  PLT 263 224 188 212  --  183  --   --  579*   Basic Metabolic Panel: Recent Labs  Lab 08/18/17 2240 08/19/17 0401 08/19/17 1613  08/20/17 1124 08/20/17 1130 08/21/17 0405  NA 132* 134* 135 136  --  136  K 4.5 4.1 3.8 3.4*  --  3.5  CL 111 109 107 104  --  106  CO2 10* 13* 17* 23  --  22  GLUCOSE 215* 230* 264* 166*  --  141*  BUN 77* 78* 77* 69*  --  68*  CREATININE 2.61* 2.86* 3.09* 2.99*  --  2.96*  CALCIUM 6.7* 7.0* 6.7* 6.8*  --  7.2*  MG 1.5*  --   --   --  1.5* 2.0  PHOS 5.7*  --   --   --  4.3 4.9*   GFR: Estimated Creatinine Clearance: 25.3 mL/min (A) (by C-G formula based on SCr of 2.96 mg/dL (H)). Liver Function Tests: Recent Labs  Lab 08/18/17 1131 08/19/17 0401 08/20/17 1124 08/21/17 0405  AST 12* 53* 67* 41  ALT 9* 24 43  39  ALKPHOS 65 50 58 79  BILITOT 0.6 1.1 0.6 0.7  PROT 6.0* 4.4* 4.4* 4.5*  ALBUMIN 2.8* 2.2* 1.9* 1.8*   No results for input(s): LIPASE, AMYLASE in the last 168 hours. No results for input(s): AMMONIA in the last 168 hours. Coagulation Profile: Recent Labs  Lab 08/18/17 2240 08/19/17 0726 08/20/17 0216 08/20/17 1124 08/21/17 0405  INR 2.30 2.52 2.18 2.80 3.04   Cardiac Enzymes: Recent Labs  Lab 08/18/17 2240 08/19/17 0718 08/19/17 1613 08/19/17 2205 08/20/17 0338  TROPONINI 0.03* 0.48* 0.62* 0.38* 0.30*   BNP (last 3 results) No results for input(s): PROBNP in the last 8760 hours. HbA1C: No results for input(s): HGBA1C in the last 72 hours. CBG: Recent Labs  Lab 08/20/17 1136 08/20/17 1619 08/20/17 2004 08/20/17 2329 08/21/17 0406  GLUCAP 153* 172* 147* 137* 137*   Lipid Profile: No results for input(s): CHOL, HDL, LDLCALC, TRIG, CHOLHDL, LDLDIRECT in the last 72 hours. Thyroid Function Tests: No results for input(s): TSH, T4TOTAL, FREET4, T3FREE, THYROIDAB in the last 72 hours. Anemia Panel: No results for input(s): VITAMINB12, FOLATE, FERRITIN, TIBC, IRON, RETICCTPCT in the last 72 hours. Sepsis Labs: No results for input(s): PROCALCITON, LATICACIDVEN in the last 168 hours.  Recent Results (from the past 240 hour(s))   Aerobic/Anaerobic Culture (surgical/deep wound)     Status: None (Preliminary result)   Collection Time: 08/18/17  6:24 PM  Result Value Ref Range Status   Specimen Description PERITONEAL CAVITY  Final   Special Requests NONE  Final   Gram Stain   Final    RARE WBC PRESENT, PREDOMINANTLY PMN NO ORGANISMS SEEN    Culture   Final    NO GROWTH < 24 HOURS Performed at [facility], [place] [address], [place], [place] [address]    Report Status PENDING  Incomplete  MRSA PCR Screening     Status: None   Collection Time: 08/18/17 11:04 PM  Result Value Ref Range Status   MRSA by PCR NEGATIVE NEGATIVE Final    Comment:        The GeneXpert MRSA Assay (FDA approved for NASAL specimens only), is one component of a comprehensive MRSA colonization surveillance program. It is not intended to diagnose MRSA infection nor to guide or monitor treatment for MRSA infections.          Radiology Studies: No results found.      Scheduled Meds: . chlorhexidine gluconate (MEDLINE KIT)  15 mL Mouth Rinse BID  . Chlorhexidine Gluconate Cloth  6 each Topical Daily  . insulin aspart  0-9 Units Subcutaneous Q4H  . mouth rinse  15 mL Mouth Rinse QID  . pantoprazole (PROTONIX) IV  40 mg Intravenous Daily  . sodium chloride flush  10-40 mL Intracatheter Q12H   Continuous Infusions: . sodium chloride 100 mL/hr at 08/21/17 0009  . fentaNYL infusion INTRAVENOUS 25 mcg/hr (08/21/17 0646)  . norepinephrine (LEVOPHED) Adult infusion 2 mcg/min (08/21/17 0512)  . piperacillin-tazobactam (ZOSYN)  IV 3.375 g (08/21/17 3235)  . vasopressin (PITRESSIN) infusion - *FOR SHOCK* 0.03 Units/min (08/20/17 2151)     LOS: 3 days    Critical care time- 40 minutes    Loretha Stapler, MD [facility] Pager [phone]  If 7PM-7AM, please contact night-coverage [email or web address] Password [facility] 08/21/2017, 7:54 AM

## 2017-08-21 NOTE — Progress Notes (Signed)
[facility] Progress Note  3 Days Post-Op  Subjective: Patient's family indicated last night patient wanted ET tube removed. This Am patient denied wanting it removed for comfort.  The patient now has a leak in the ET tube, and he continues to have a respiratory alkalosis from over breathing the ventilator. There is a delicate balance between keeping him comfortable and keeping him sedated, and this is making it challenging to normalize the blood gas with him over breathing the vent at times.  He remains awake and alert and interactive.  I have discussed with him removing the tube as he is as strong as he is going to be at this time.  In addition, the option of replacing the tube is not a good idea given the DNR, comorbidities and additional risks.  I have discussed this with his family, and they are comfortable with the breathing tube coming out once the entire family has arrived. The understand that he may tire out and this would lead to death.    Continues to have a worsening coagulopathy but no signs of bleeding.   Objective: Vital signs in last 24 hours: Temp:  [97.7 F (36.5 C)-97.8 F (36.6 C)] 97.7 F (36.5 C) (12/01 0400) Pulse Rate:  [68-97] 75 (12/01 0400) Resp:  [12-31] 17 (12/01 0400) BP: (58-158)/(43-91) 134/58 (12/01 0400) SpO2:  [97 %-100 %] 100 % (12/01 0758) Arterial Line BP: (77-166)/(34-64) 146/58 (12/01 0430) FiO2 (%):  [40 %] 40 % (12/01 0808) Last BM Date: 08/20/17  Intake/Output from previous day: 11/30 0701 - 12/01 0700 In: 2349.2 [I.V.:2149.2; IV Piggyback:200] Out: 2330 [Urine:800; Emesis/NG output:600; Drains:930] Intake/Output this shift: No intake/output data recorded.  General appearance: alert, no distress and 11T Resp: clear to auscultation bilaterally GI: soft, appropriately tender, JP with light serosanginous material, ostomy retracted, and mucosal sloughing in the bag Extremities: moves all extremities  Lab Results:  Recent  Labs    08/19/17 1613  08/20/17 1124 08/21/17 0405  WBC 13.2*  --   --  15.0*  HGB 6.6*   < > 7.6* 8.0*  HCT 20.3*   < > 22.4* 24.2*  PLT 183  --   --  120*   < > = values in this interval not displayed.   BMET Recent Labs    08/20/17 1124 08/21/17 0405  NA 136 136  K 3.4* 3.5  CL 104 106  CO2 23 22  GLUCOSE 166* 141*  BUN 69* 68*  CREATININE 2.99* 2.96*  CALCIUM 6.8* 7.2*   PT/INR Recent Labs    08/20/17 1124 08/21/17 0405  LABPROT 29.3* 31.2*  INR 2.80 3.04     Anti-infectives: Anti-infectives (From admission, onward)   Start     Dose/Rate Route Frequency Ordered Stop   08/19/17 0600  cefoTEtan in Dextrose 5% (CEFOTAN) IVPB 2 g     2 g Intravenous On call to O.R. 08/18/17 1522 08/18/17 1720   08/18/17 1445  piperacillin-tazobactam (ZOSYN) IVPB 3.375 g  Status:  Discontinued     3.375 g 100 mL/hr over 30 Minutes Intravenous Every 6 hours 08/18/17 1432 08/18/17 1439   08/18/17 1445  piperacillin-tazobactam (ZOSYN) IVPB 3.375 g     3.375 g 12.5 mL/hr over 240 Minutes Intravenous Every 8 hours 08/18/17 1439        Assessment/Plan: Mr. Drew is a 76 yo POD 3 s/p Ex lap, SBR and right hemicolectomy, end jejunostomy with likely only 150cm of small bowel remaining after ischemic bowel from his carcinoid  tumor causing SMA compression. He has as cuff leak and if the tube is not removed it would have to be replaced. I do not think replacing the tube is advisable given the prognosis, and at this time the patient is pulling good volumes and is over breathing the vent and has a respiratory alkalosis.  I think this is the best chance for extubation and to allow the family and the patient time together. We have discussed that there is no timeframe for when he might tire out.  In addition, we have discussed that he can be on High Flow for oxygen support, but that BiPAP is not a good option outside of giving a family member time to see the patient.  I do not believe he needs to  go on BiPAP for an unspecified amount of time, and the family understands this plan.  -PRN for pain, gtts and sedation discontinued -Extubation, do not think a CXR will add to this plan or change it -NPO, NG in place -Coagulopathy worsening with INR 3, H&H remains relatively stable, no signs of bleeding, would not transfuse further  -Remains on small dose levophed and vasopresin at shock dose  -Hold anticoagulation as he has INR 3  -Allow family to be at bedside to give comfort and visit    LOS: 3 days    Virl Cagey 08/21/2017

## 2017-08-21 NOTE — Progress Notes (Signed)
The patient has required an increase in ETT cuff pressure from 28 mbar/cmH20 to 35.

## 2017-08-21 NOTE — Plan of Care (Signed)
Pt able to answer questions appropriately on vent. Did not have to increase sedation. Pt very sensitive to Levo. Had to titrate up due to a decrease in BP after a PRN dose of versed. Levo now back at 27mcg with adequate arterial Blood pressure. Will continue to titrate down. JP drain continue to have a lot of drainage.

## 2017-08-21 NOTE — Procedures (Signed)
Extubation Procedure Note Airway and mouth suctioned, tube holder loosened and patient extubated to 6L HFNC. Mouth care performed by RN. Patient Details:   Name: Stephen [name] DOB: [birth year] MRN: [id number]   Airway Documentation:  Airway 7 mm (Active)  Secured at (cm) 21 cm 08/21/2017 11:06 AM  Measured From Lips 08/21/2017 11:06 AM  [place] 08/21/2017 11:06 AM  Secured By [employer] 08/21/2017 11:06 AM  Tube Holder Repositioned Yes 08/21/2017  7:58 AM  Cuff Pressure (cm H2O) 35 cm H2O 08/21/2017  7:58 AM  Site Condition Dry 08/21/2017 11:06 AM    Evaluation  O2 sats: stable throughout Complications: No apparent complications Patient did  tolerate procedure well. Bilateral Breath Sounds: Clear   Yes, the patient was able to speak.  Pete Pelt 08/21/2017, 11:53 AM

## 2017-08-22 LAB — GLUCOSE, CAPILLARY
GLUCOSE-CAPILLARY: 120 mg/dL — AB (ref 65–99)
GLUCOSE-CAPILLARY: 121 mg/dL — AB (ref 65–99)
GLUCOSE-CAPILLARY: 128 mg/dL — AB (ref 65–99)
GLUCOSE-CAPILLARY: 138 mg/dL — AB (ref 65–99)
GLUCOSE-CAPILLARY: 141 mg/dL — AB (ref 65–99)

## 2017-08-22 MED ORDER — ALBUMIN HUMAN 25 % IV SOLN
12.5000 g | Freq: Once | INTRAVENOUS | Status: AC
  Administered 2017-08-22: 12.5 g via INTRAVENOUS
  Filled 2017-08-22: qty 50

## 2017-08-22 MED ORDER — NOREPINEPHRINE 4 MG/250ML-% IV SOLN
INTRAVENOUS | Status: AC
  Filled 2017-08-22: qty 250

## 2017-08-22 MED ORDER — NOREPINEPHRINE BITARTRATE 1 MG/ML IV SOLN
INTRAVENOUS | Status: AC
  Filled 2017-08-22: qty 4

## 2017-08-22 NOTE — Progress Notes (Signed)
[facility] Progress Note  4 Days Post-Op  Subjective: Extubated yesterday, no major issues and remains on 3L. Looks comfortable. NG with minimal output. JP getting emptied qshift.    Able to discuss the prognosis and the surgery with the patient and his family today. Described the weakness of the his body, CKD, signs of liver failure/ synthetic function issues with low albumin, INR elevation, and ascites, and discussed that the resection caused for his small bowel to be very short in length and unsure of how much nutrition he will be able to absorb in the future. Discussed the natural progression of events after body gets this weak and debilitated including additional complications, cardiac complications, infections including PNA and blood infections or intrabdominal infections.    Objective: Vital signs in last 24 hours: Temp:  [97.3 F (36.3 C)-97.9 F (36.6 C)] 97.7 F (36.5 C) (12/02 1146) Pulse Rate:  [74-115] 82 (12/02 1100) Resp:  [8-25] 15 (12/02 1100) BP: (92-134)/(43-100) 100/60 (12/02 1100) SpO2:  [86 %-100 %] 93 % (12/02 1100) Arterial Line BP: (90-131)/(43-58) 120/58 (12/02 1100) Weight:  [215 lb 13.3 oz (97.9 kg)] 215 lb 13.3 oz (97.9 kg) (12/02 0500) Last BM Date: 08/20/17  Intake/Output from previous day: 12/01 0701 - 12/02 0700 In: 2906.3 [I.V.:2743.8; IV Piggyback:162.5] Out: 1100 [Urine:1100] Intake/Output this shift: Total I/O In: 683 [I.V.:683] Out: -   General appearance: alert, cooperative, no distress and oriented X4 and interacting Resp: clear to auscultation, low lung volumes GI: soft, appropriately tender, staples c/d/i with no erythema or drainage, ostomy with mucosal sloughing, and JP with ss output, not leaking around tube Extremities: extremities warm, moving  Lab Results:  Recent Labs    08/19/17 1613  08/20/17 1124 08/21/17 0405  WBC 13.2*  --   --  15.0*  HGB 6.6*   < > 7.6* 8.0*  HCT 20.3*   < > 22.4* 24.2*  PLT 183   --   --  120*   < > = values in this interval not displayed.   BMET Recent Labs    08/20/17 1124 08/21/17 0405  NA 136 136  K 3.4* 3.5  CL 104 106  CO2 23 22  GLUCOSE 166* 141*  BUN 69* 68*  CREATININE 2.99* 2.96*  CALCIUM 6.8* 7.2*   PT/INR Recent Labs    08/20/17 1124 08/21/17 0405  LABPROT 29.3* 31.2*  INR 2.80 3.04     Anti-infectives: Anti-infectives (From admission, onward)   Start     Dose/Rate Route Frequency Ordered Stop   08/19/17 0600  cefoTEtan in Dextrose 5% (CEFOTAN) IVPB 2 g     2 g Intravenous On call to O.R. 08/18/17 1522 08/18/17 1720   08/18/17 1445  piperacillin-tazobactam (ZOSYN) IVPB 3.375 g  Status:  Discontinued     3.375 g 100 mL/hr over 30 Minutes Intravenous Every 6 hours 08/18/17 1432 08/18/17 1439   08/18/17 1445  piperacillin-tazobactam (ZOSYN) IVPB 3.375 g     3.375 g 12.5 mL/hr over 240 Minutes Intravenous Every 8 hours 08/18/17 1439        Assessment/Plan: Mr. Kittelson is a 76 yo POD4 s/p Ex lap, SBR and right hemicolectomy, end jejunostomy with likely only 150cm of small bowel remaining after ischemic bowel from his carcinoid tumor causing SMA compression. extubated yesterday and doing fair. Remains on pressors with levophed at 1 and vasopresin.  NG output minimal. Discussed everything with patient and family today. Discussed getting NG out and patient asking if he could eat.  Discussed that I am unsure how much blood flow and how healthy his bowels are at this point, but that if he wants food and understands that it could make him sick and that we are not escalating care that we will given him some food for comfort.  The patient expresses understanding of the prognosis and that we are unsure of the timeline but that his body is shooting down.   -PRN for pain, keep comfortable  -3L in place, not escalating to any additional respiratory maneuvers -CKD, liver dysfunction with ascites, low albumin, and INR elevated, giving 25% albumin  -No  escalation of care -Can have full liquid diet for comfort   -Allow family to be at bedside to give comfort  -Patient expressing understanding and possible desire for going home with hospice, hospice house, and we discussed that this will depend on resources and ability wean pressors versus stopping them with the realization he might not make it to those locations    LOS: 4 days    Virl Cagey 08/22/2017

## 2017-08-22 NOTE — Plan of Care (Signed)
Care plans progressing. Extubated and able to understand information provided.

## 2017-08-22 NOTE — Progress Notes (Signed)
PROGRESS NOTE    Stephen [name]  FBX:[id number] DOB: Jul 27, 1941 DOA: 08/18/2017 PCP: [name], MD    Brief Narrative:  Stephen [name] is a 76 y.o. male with medical history significant of metastici carcinoid tumor who recently underwent gastrojejunostomy and small bowel resection 16 days ago, CKF Stage III, Atrial fibrillation on eliquis presented to the ED with syncope.  Per Dr. Constance Haw' History and physical patient voiced he was having slightly decreased PO intake and diarrhea.  He underwent CT scan of the abdomen and began having abdominal pain.  No further history obtainable as patient in significant pain and prepping for surgery.  ED Course: Patient found to be hypotensive- received 2L boluses.  General surgeon Dr. Constance Haw evaluated after CT scan results showing free air, bowel wall thickening and concern for ischemia. Of note his labs show WBC of 8.5, H/H 8.8/28.6, Plt of 224.  Creatinine of 2.97 (baseline appears to be between 2.2-2.3). Patient to be admitted to ICU and to undergo surgery emergently.  Patient underwent Palliative exploratory laparotomy, distal small bowel resection (150cm) and right hemicolectomy, drain placement around the gastrojejunostomy  on 08/18/2017.  During his surgical course in the operating room he developed numerous cardiac arrhythmias as well as hypotension which was treated with a 3 unit PRBC transfusion as well as numerous pressure medications he was transferred to the ICU in critical condition.  He remained intubated and on 08/19/2017 palliative care was consulted for further discussion with patient's family about goals of care.  Patient's family states that they would like to wait the next 24-48 hours to see how patient progresses following his surgery.     Assessment & Plan:   Principal Problem:   Intra-abdominal free air of unknown etiology Active Problems:   Diabetes mellitus, type II ([place])   Essential hypertension   Hyperlipidemia   CKD  (chronic kidney disease) stage 3, GFR 30-59 ml/min (HCC)   Metastatic malignant carcinoid tumor to liver (HCC)   Pressure injury of skin   Coagulopathy (HCC)   Anemia   Acute blood loss anemia   Intra abdominal free air s/p Palliative exploratory laparotomy, distal small bowel resection (150cm) and right hemicolectomy, drain placement around the gastrojejunostomy  -Status post 5 units PRBC transfusion -Status post 3 units of FFP -Patient remains on vasopressin and levophed -Patient extubated yesterday, remains critically ill -General surgery following -We will wean Levophed and vasopressin as tolerated  Atrial fibrillation -Holding blood thinners as patient is coagulopathic  Elevated troponin -Likely in the setting of hypotension as well as acute renal failure  Coagulopathy -INR increased yesterday -Further transfusions of FFP or blood unlikely to make much of a difference in this critically ill patient   CKD Creatinine stable at 2.96  Diabetes Mellitus Type II - CBG monitoring ACHS - SSI  HLD - holding statin     DVT prophylaxis: none Code Status:DNR Family Communication: No family is bedside this morning Disposition Plan: She is critically ill.  Will discuss with Dr. Constance Haw and family possible transition to hospice or comfort based care.  Consultants:   General Surgery  Palliative Care  Procedures:  Palliative exploratory laparotomy, distal small bowel resection (150cm) and right hemicolectomy, drain placement around the gastrojejunostomy   Antimicrobials:   Cefotetan    Subjective: Patient is awake this morning.  He states his mouth is extremely dry and he is repeatedly asking for something to wet his mouth and throat.  He says he has pain but could not say  where it was.  He was extubated yesterday.   Objective: Vitals:   08/22/17 0400 08/22/17 0500 08/22/17 0600 08/22/17 0801  BP: (!) 117/52 (!) 94/50 (!) 100/44   Pulse: 84 98 91   Resp:  (!) 9 18 12    Temp: 97.6 F (36.4 C)   (!) 97.4 F (36.3 C)  TempSrc: Oral   Oral  SpO2: 100% 100% 100%   Weight:  97.9 kg (215 lb 13.3 oz)    Height:        Intake/Output Summary (Last 24 hours) at 08/22/2017 0811 Last data filed at 08/22/2017 0500 Gross per 24 hour  Intake 2856.31 ml  Output 1100 ml  Net 1756.31 ml   Filed Weights   08/19/17 0500 08/20/17 0500 08/22/17 0500  Weight: 89.5 kg (197 lb 5 oz) 94.5 kg (208 lb 5.4 oz) 97.9 kg (215 lb 13.3 oz)    Examination:  General exam: Appears calm and comfortable  Respiratory system: Rhonchi and upper airway noises appreciated Cardiovascular system: S1 & S2 heard, RRR. No JVD, murmurs, rubs, gallops or clicks.  Significant 2+ edema to mid thighs bilaterally. Gastrointestinal system: Bandage over mid abdomen clean dry and intact.  NG tube in place with bilious liquid in cannister Central nervous system: Patient is awake and alert and is able to answer questions however some of his answers are incomprehensible, he is able to move his arms bilaterally Extremities: Able to move arms and hands bilaterally, significant edema of the upper and lower extremities bilaterally Skin: Surgical site covered with bandage Psychiatry: Somber mood    Data Reviewed: I have personally reviewed following labs and imaging studies  CBC: Recent Labs  Lab 08/16/17 0824 08/18/17 1131 08/18/17 2240 08/19/17 0401 08/19/17 0718 08/19/17 1613 08/20/17 0216 08/20/17 1124 08/21/17 0405  WBC 9.3 8.5 12.7* 16.4*  --  13.2*  --   --  15.0*  NEUTROABS 6.3 7.4 10.9*  --   --   --   --   --  13.0*  HGB 9.3* 8.8* 8.6* 7.5* 7.2* 6.6* 8.0* 7.6* 8.0*  HCT 30.5* 28.6* 27.4* 23.5* 22.2* 20.3* 23.6* 22.4* 24.2*  MCV 97.8 97.6 92.3 91.8  --  88.6  --   --  89.0  PLT 263 224 188 212  --  183  --   --  254*   Basic Metabolic Panel: Recent Labs  Lab 08/18/17 2240 08/19/17 0401 08/19/17 1613 08/20/17 1124 08/20/17 1130 08/21/17 0405  NA 132* 134* 135 136   --  136  K 4.5 4.1 3.8 3.4*  --  3.5  CL 111 109 107 104  --  106  CO2 10* 13* 17* 23  --  22  GLUCOSE 215* 230* 264* 166*  --  141*  BUN 77* 78* 77* 69*  --  68*  CREATININE 2.61* 2.86* 3.09* 2.99*  --  2.96*  CALCIUM 6.7* 7.0* 6.7* 6.8*  --  7.2*  MG 1.5*  --   --   --  1.5* 2.0  PHOS 5.7*  --   --   --  4.3 4.9*   GFR: Estimated Creatinine Clearance: 25.7 mL/min (A) (by C-G formula based on SCr of 2.96 mg/dL (H)). Liver Function Tests: Recent Labs  Lab 08/18/17 1131 08/19/17 0401 08/20/17 1124 08/21/17 0405  AST 12* 53* 67* 41  ALT 9* 24 43 39  ALKPHOS 65 50 58 79  BILITOT 0.6 1.1 0.6 0.7  PROT 6.0* 4.4* 4.4* 4.5*  ALBUMIN 2.8* 2.2* 1.9*  1.8*   No results for input(s): LIPASE, AMYLASE in the last 168 hours. No results for input(s): AMMONIA in the last 168 hours. Coagulation Profile: Recent Labs  Lab 08/18/17 2240 08/19/17 0726 08/20/17 0216 08/20/17 1124 08/21/17 0405  INR 2.30 2.52 2.18 2.80 3.04   Cardiac Enzymes: Recent Labs  Lab 08/18/17 2240 08/19/17 0718 08/19/17 1613 08/19/17 2205 08/20/17 0338  TROPONINI 0.03* 0.48* 0.62* 0.38* 0.30*   BNP (last 3 results) No results for input(s): PROBNP in the last 8760 hours. HbA1C: No results for input(s): HGBA1C in the last 72 hours. CBG: Recent Labs  Lab 08/21/17 1658 08/21/17 2041 08/22/17 0019 08/22/17 0405 08/22/17 0745  GLUCAP 125* 133* 128* 141* 138*   Lipid Profile: No results for input(s): CHOL, HDL, LDLCALC, TRIG, CHOLHDL, LDLDIRECT in the last 72 hours. Thyroid Function Tests: No results for input(s): TSH, T4TOTAL, FREET4, T3FREE, THYROIDAB in the last 72 hours. Anemia Panel: No results for input(s): VITAMINB12, FOLATE, FERRITIN, TIBC, IRON, RETICCTPCT in the last 72 hours. Sepsis Labs: No results for input(s): PROCALCITON, LATICACIDVEN in the last 168 hours.  Recent Results (from the past 240 hour(s))  Aerobic/Anaerobic Culture (surgical/deep wound)     Status: None (Preliminary  result)   Collection Time: 08/18/17  6:24 PM  Result Value Ref Range Status   Specimen Description PERITONEAL CAVITY  Final   Special Requests NONE  Final   Gram Stain   Final    RARE WBC PRESENT, PREDOMINANTLY PMN NO ORGANISMS SEEN    Culture   Final    NO GROWTH 2 DAYS Performed at [facility], [address] [place], [place] [address]    Report Status PENDING  Incomplete  MRSA PCR Screening     Status: None   Collection Time: 08/18/17 11:04 PM  Result Value Ref Range Status   MRSA by PCR NEGATIVE NEGATIVE Final    Comment:        The GeneXpert MRSA Assay (FDA approved for NASAL specimens only), is one component of a comprehensive MRSA colonization surveillance program. It is not intended to diagnose MRSA infection nor to guide or monitor treatment for MRSA infections.          Radiology Studies: No results found.      Scheduled Meds: . chlorhexidine gluconate (MEDLINE KIT)  15 mL Mouth Rinse BID  . Chlorhexidine Gluconate Cloth  6 each Topical Daily  . insulin aspart  0-9 Units Subcutaneous Q4H  . mouth rinse  15 mL Mouth Rinse QID  . pantoprazole (PROTONIX) IV  40 mg Intravenous Daily  . sodium chloride flush  10-40 mL Intracatheter Q12H   Continuous Infusions: . sodium chloride 100 mL/hr at 08/21/17 1100  . norepinephrine (LEVOPHED) Adult infusion Stopped (08/22/17 0200)  . piperacillin-tazobactam (ZOSYN)  IV 3.375 g (08/22/17 0550)  . vasopressin (PITRESSIN) infusion - *FOR SHOCK* 0.03 Units/min (08/22/17 0500)     LOS: 4 days    Critical care time-35 minutes    Loretha Stapler, MD [facility] Pager [phone]  If 7PM-7AM, please contact night-coverage [email or web address] Password [email or web address] 08/22/2017, 8:11 AM

## 2017-08-23 ENCOUNTER — Encounter (HOSPITAL_COMMUNITY): Payer: PPO

## 2017-08-23 ENCOUNTER — Ambulatory Visit: Payer: Self-pay | Admitting: Family

## 2017-08-23 DIAGNOSIS — Z7189 Other specified counseling: Secondary | ICD-10-CM

## 2017-08-23 DIAGNOSIS — Z515 Encounter for palliative care: Secondary | ICD-10-CM

## 2017-08-23 LAB — GLUCOSE, CAPILLARY
Glucose-Capillary: 100 mg/dL — ABNORMAL HIGH (ref 65–99)
Glucose-Capillary: 127 mg/dL — ABNORMAL HIGH (ref 65–99)
Glucose-Capillary: 127 mg/dL — ABNORMAL HIGH (ref 65–99)
Glucose-Capillary: 122 mg/dL — ABNORMAL HIGH (ref 65–99)
Glucose-Capillary: 139 mg/dL — ABNORMAL HIGH (ref 65–99)
Glucose-Capillary: 125 mg/dL — ABNORMAL HIGH (ref 65–99)

## 2017-08-23 MED ORDER — INSULIN ASPART 100 UNIT/ML ~~LOC~~ SOLN: 0.0000 [IU] | Freq: Every day | SUBCUTANEOUS | Status: DC

## 2017-08-23 MED ORDER — ORAL CARE MOUTH RINSE
15.0000 mL | Freq: Two times a day (BID) | OROMUCOSAL | Status: DC
  Administered 2017-08-23 – 2017-08-24 (×2): 15 mL via OROMUCOSAL

## 2017-08-23 MED ORDER — INSULIN ASPART 100 UNIT/ML ~~LOC~~ SOLN
0.0000 [IU] | Freq: Three times a day (TID) | SUBCUTANEOUS | Status: DC
Start: 2017-08-23 — End: 2017-08-24
  Administered 2017-08-23 – 2017-08-24 (×4): 1 [IU] via SUBCUTANEOUS

## 2017-08-23 NOTE — Progress Notes (Signed)
Daily Progress Note   Patient Name: Stephen [name]       Date: 08/23/2017 DOB: 02-07-1941  Age: 76 y.o. MRN#: [id number] Attending Physician: Eber Jones, MD Primary Care Physician: [name], MD Admit Date: 08/18/2017  Reason for Consultation/Follow-up: Establishing goals of care, Hospice Evaluation, Inpatient hospice referral and Psychosocial/spiritual support  Subjective: Stephen [name] is resting quietly in bed, he will make eye contact, calm and pleasant. Family at bedside. We talk about home with hospice vs hospice home. We talk about d/c of vasopressors.  Stephen [name] states he wants to have food to eat and comfort. He states he thinks that hospice home will be a good choice, but family will discuss.   Length of Stay: 5  Current Medications: Scheduled Meds:  . chlorhexidine gluconate (MEDLINE KIT)  15 mL Mouth Rinse BID  . Chlorhexidine Gluconate Cloth  6 each Topical Daily  . insulin aspart  0-5 Units Subcutaneous QHS  . insulin aspart  0-9 Units Subcutaneous TID WC  . mouth rinse  15 mL Mouth Rinse QID  . pantoprazole (PROTONIX) IV  40 mg Intravenous Daily  . sodium chloride flush  10-40 mL Intracatheter Q12H    Continuous Infusions: . sodium chloride 100 mL/hr at 08/23/17 0303  . norepinephrine (LEVOPHED) Adult infusion 2 mcg/min (08/23/17 1305)  . piperacillin-tazobactam (ZOSYN)  IV Stopped (08/23/17 1146)  . vasopressin (PITRESSIN) infusion - *FOR SHOCK* Stopped (08/22/17 1733)    PRN Meds: acetaminophen **OR** acetaminophen, HYDROmorphone (DILAUDID) injection, morphine injection, ondansetron **OR** ondansetron (ZOFRAN) IV, phenol, sodium chloride flush  Physical Exam  Constitutional: No distress.  HENT:  Head: Atraumatic.  Temporal wasting    Cardiovascular: Normal rate.  Pulmonary/Chest: Effort normal. No respiratory distress.  Abdominal: Soft. He exhibits no distension.  Neurological: He is alert.  Skin: Skin is warm and dry.  Nursing note and vitals reviewed.           Vital Signs: BP (!) 102/54   Pulse 91   Temp (!) 97.4 F (36.3 C) (Oral)   Resp 18   Ht 6' (1.829 m)   Wt 97.9 kg (215 lb 13.3 oz)   SpO2 97%   BMI 29.27 kg/m  SpO2: SpO2: 97 % O2 Device: O2 Device: High Flow Nasal Cannula O2 Flow Rate: O2 Flow Rate (L/min): 3 L/min  Intake/output summary:   Intake/Output Summary (Last 24 hours) at 08/23/2017 1436 Last data filed at 08/23/2017 1222 Gross per 24 hour  Intake 2267.8 ml  Output 1785 ml  Net 482.8 ml   LBM: Last BM Date: 08/23/17(continuous liquid drainage from ileostomy) Baseline Weight: Weight: 83.9 kg (185 lb) Most recent weight: Weight: 97.9 kg (215 lb 13.3 oz)       Palliative Assessment/Data:    Flowsheet Rows     Most Recent Value  Intake Tab  Referral Department  Hospitalist  Unit at Time of Referral  ICU  Palliative Care Primary Diagnosis  Cancer  Date Notified  08/19/17  Palliative Care Type  New Palliative care  Reason for referral  Clarify Goals of Care, Psychosocial or Spiritual support  Date of Admission  08/18/17  Date first seen by Palliative Care  08/19/17  # of days Palliative referral response time  0 Day(s)  # of days IP prior to Palliative referral  1  Clinical Assessment  Palliative Performance Scale Score  20%  Pain Max last 24 hours  Not able to report  Pain Min Last 24 hours  Not able to report  Dyspnea Max Last 24 Hours  Not able to report  Dyspnea Min Last 24 hours  Not able to report  Psychosocial & Spiritual Assessment  Palliative Care Outcomes  Patient/Family meeting held?  Yes  Who was at the meeting?  Stephen [name], her husband Stephen [name], son Stephen [name]  Palliative Care Outcomes  Clarified goals of care, Provided psychosocial or spiritual support, Provided  advance care planning      Patient Active Problem List   Diagnosis Date Noted  . Goals of care, counseling/discussion   . Palliative care encounter   . Intra-abdominal free air of unknown etiology 08/18/2017  . Pressure injury of skin 08/18/2017  . Coagulopathy ([place]) 08/18/2017  . Anemia 08/18/2017  . Acute blood loss anemia 08/18/2017  . Metastatic malignant carcinoid tumor to liver ([place]) 08/16/2017  . Gastric outlet obstruction   . Neoplasm of uncertain behavior of mesentery   . Preoperative cardiovascular examination   . Intractable nausea and vomiting 07/28/2017  . Bowel obstruction ([place]) 07/28/2017  . Acute on chronic renal failure ([place]) 07/28/2017  . Protein-calorie malnutrition, severe 07/06/2017  . Enteropathogenic Escherichia coli infection 07/06/2017  . Abnormal weight loss 07/06/2017  . Acute renal failure superimposed on stage 4 chronic kidney disease ([place]) 07/05/2017  . Permanent atrial fibrillation ([place]) 07/05/2017  . Diarrhea 07/05/2017  . Iron deficiency anemia 12/31/2016  . PAF (paroxysmal atrial fibrillation) ([place]) 12/14/2016  . Stroke (cerebrum) ([place]) 09/04/2016  . B12 deficiency 08/17/2016  . PAD (peripheral artery disease) ([place]) 01/21/2016  . Irregular heartbeat 07/19/2014  . Atherosclerotic PVD with intermittent claudication ([place]) 07/17/2014  . Carotid stenosis 07/12/2014  . Chronic diastolic heart failure ([place]) 09/27/2013  . Occlusion and stenosis of carotid artery without mention of cerebral infarction 06/28/2012  . Anemia of chronic renal failure, stage 3 (moderate) ([place]) 06/01/2012  . CKD (chronic kidney disease) stage 3, GFR 30-59 ml/min (HCC) 06/01/2012  . Diabetes mellitus, type II ([place])   . Essential hypertension   . Hyperlipidemia   . Cerebrovascular disease   . Coronary artery disease involving native coronary artery of native heart with angina pectoris ([place])   . Peripheral vascular disease [facility])     Palliative Care Assessment & Plan    Patient Profile: 76 y.o. male  with past medical history of atrial fibrillation on Eliquis, right carotid endarterectomy  in 2001 left in 2008, coronary atherosclerosis with stenting in 1994, degenerative joint disease lumbar, diabetes, high blood pressure and cholesterol, peripheral vascular disease, 30-pack-year history of smoking, stage III kidney disease with associated anemia, metastatic carcinoid tumor of abdomen with gastro-jejunostomy 2 weeks ago for palliative treatment admitted on 08/18/2017 with intra-abdominal free air, ischemic bowel.   Assessment: Intra-abdomen cancer and bowel ischemia: now with only 150cm of small intestine. Very poor prognosis. Considering hospice in home vs residential.   Recommendations/Plan:  Home with hospice vs residential hospice.   Goals of Care and Additional Recommendations:  Limitations on Scope of Treatment: considering home with hospice vs residential hospice.   Code Status:    Code Status Orders  (From admission, onward)        Start     Ordered   08/18/17 2051  Do not attempt resuscitation (DNR)  Continuous    Question Answer Comment  In the event of cardiac or respiratory ARREST Do not call a "code blue"   In the event of cardiac or respiratory ARREST Do not perform Intubation, CPR, defibrillation or ACLS   In the event of cardiac or respiratory ARREST Use medication by any route, position, wound care, and other measures to relive pain and suffering. May use oxygen, suction and manual treatment of airway obstruction as needed for comfort.   Comments patient and family agree to DNR after operation, patient had been DNR prior      08/18/17 2050    Code Status History    Date Active Date Inactive Code Status Order ID Comments User Context   08/18/2017 20:35 08/18/2017 20:50 Full Code [id number]  Eber Jones, MD Inpatient   07/28/2017 23:39 08/10/2017 15:12 DNR [id number]  Elwin Mocha, MD ED   07/05/2017 19:54 07/09/2017  14:41 Full Code [id number]  Orson Eva, MD Inpatient   09/04/2016 22:55 09/08/2016 21:11 Full Code [id number]  Kerney Elbe, MD Inpatient       Prognosis:   < 4 weeks or less would not be spurprising.   Discharge Planning:  considering home with hospice vs residential hospice.   Care plan was discussed with nursing staff, CM, SW, and Dr. Adair Patter.   Thank you for allowing the Palliative Medicine Team to assist in the care of this patient.   Time In: 1220 Time Out: 1300 Total Time 40 minutes Prolonged Time Billed  no       Greater than 50%  of this time was spent counseling and coordinating care related to the above assessment and plan.  Drue Novel, NP  Please contact Palliative Medicine Team phone at [phone] for questions and concerns.

## 2017-08-23 NOTE — Progress Notes (Signed)
PROGRESS NOTE    IBN STIEF  WKG:[id number] DOB: Jul 24, 1941 DOA: 08/18/2017 PCP: [name], MD    Brief Narrative:  KELDRIC POYER is a 76 y.o. male with medical history significant of metastici carcinoid tumor who recently underwent gastrojejunostomy and small bowel resection 16 days ago, CKF Stage III, Atrial fibrillation on eliquis presented to the ED with syncope.  Per Dr. Constance Haw' History and physical patient voiced he was having slightly decreased PO intake and diarrhea.  He underwent CT scan of the abdomen and began having abdominal pain.  No further history obtainable as patient in significant pain and prepping for surgery.  ED Course: Patient found to be hypotensive- received 2L boluses.  General surgeon Dr. Constance Haw evaluated after CT scan results showing free air, bowel wall thickening and concern for ischemia. Of note his labs show WBC of 8.5, H/H 8.8/28.6, Plt of 224.  Creatinine of 2.97 (baseline appears to be between 2.2-2.3). Patient to be admitted to ICU and to undergo surgery emergently.  Patient underwent Palliative exploratory laparotomy, distal small bowel resection (150cm) and right hemicolectomy, drain placement around the gastrojejunostomy  on 08/18/2017.  During his surgical course in the operating room he developed numerous cardiac arrhythmias as well as hypotension which was treated with a 3 unit PRBC transfusion as well as numerous pressure medications he was transferred to the ICU in critical condition.  He remained intubated and on 08/19/2017 palliative care was consulted for further discussion with patient's family about goals of care.  Patient's family states that they would like to wait the next 24-48 hours to see how patient progresses following his surgery.     Assessment & Plan:   Principal Problem:   Intra-abdominal free air of unknown etiology Active Problems:   Diabetes mellitus, type II ([place])   Essential hypertension   Hyperlipidemia   CKD  (chronic kidney disease) stage 3, GFR 30-59 ml/min (HCC)   Metastatic malignant carcinoid tumor to liver (HCC)   Pressure injury of skin   Coagulopathy (HCC)   Anemia   Acute blood loss anemia   Intra abdominal free air s/p Palliative exploratory laparotomy, distal small bowel resection (150cm) and right hemicolectomy, drain placement around the gastrojejunostomy  -Status post 5 units PRBC transfusion -Status post 3 units of FFP -Vasopressin weaned -Wean Levophed as tolerated -Patient is interested in inpatient hospice facility, social work assisting in finding placement  Goals of care - After discussions with both Dr. Constance Haw, palliative care provider, and hospitalist patient family and patient agree for patient to be discharged to hospice facility at earliest bed availability.  They all understand that patient may not tolerate the weaning of Levophed and that he may have significant pain with comfort based feeding.  At this time no further interventions planned    DVT prophylaxis: none Code Status:DNR Family Communication: Sister, brother-in-law, bedside  disposition Plan: Charge to hospice at soonest as bed availability  Consultants:   General Surgery  Palliative Care  Procedures:  Palliative exploratory laparotomy, distal small bowel resection (150cm) and right hemicolectomy, drain placement around the gastrojejunostomy   Antimicrobials:   Cefotetan    Subjective: Patient awake this morning.  States that he is interested in pursuing home with hospice.  Understands that there is significant obstacles that could prevent this given his critical illness.  Patient's family states he had a big breakfast.   Objective: Vitals:   08/22/17 2100 08/22/17 2200 08/22/17 2300 08/23/17 0000  BP: (!) 82/48 (!) 91/58 (!) 93/47 Marland Kitchen)  107/55  Pulse: 88 89 90 89  Resp: 14 12 12 14   Temp:    (!) 97.5 F (36.4 C)  TempSrc:    Oral  SpO2: 100% 100% 100% 100%  Weight:      Height:         Intake/Output Summary (Last 24 hours) at 08/23/2017 3568 Last data filed at 08/23/2017 0000 Gross per 24 hour  Intake 2440.8 ml  Output 835 ml  Net 1605.8 ml   Filed Weights   08/19/17 0500 08/20/17 0500 08/22/17 0500  Weight: 89.5 kg (197 lb 5 oz) 94.5 kg (208 lb 5.4 oz) 97.9 kg (215 lb 13.3 oz)    Examination:  General exam: Appears calm and comfortable  Respiratory system: Rhonchi and upper airway noises appreciated Cardiovascular system: S1 & S2 heard, RRR. No JVD, murmurs, rubs, gallops or clicks.  Significant 3+ edema to groin with significant edema noted in the scrotum Gastrointestinal system: Bandage over mid abdomen clean dry and intact.  Central nervous system: Patient is awake and alert and able to answer questions.  He seems oriented to situation, place, time, person Extremities: Patient is able to move his arms. Skin: Surgical site covered with bandage Psychiatry: Somber mood    Data Reviewed: I have personally reviewed following labs and imaging studies  CBC: Recent Labs  Lab 08/16/17 0824 08/18/17 1131 08/18/17 2240 08/19/17 0401 08/19/17 0718 08/19/17 1613 08/20/17 0216 08/20/17 1124 08/21/17 0405  WBC 9.3 8.5 12.7* 16.4*  --  13.2*  --   --  15.0*  NEUTROABS 6.3 7.4 10.9*  --   --   --   --   --  13.0*  HGB 9.3* 8.8* 8.6* 7.5* 7.2* 6.6* 8.0* 7.6* 8.0*  HCT 30.5* 28.6* 27.4* 23.5* 22.2* 20.3* 23.6* 22.4* 24.2*  MCV 97.8 97.6 92.3 91.8  --  88.6  --   --  89.0  PLT 263 224 188 212  --  183  --   --  616*   Basic Metabolic Panel: Recent Labs  Lab 08/18/17 2240 08/19/17 0401 08/19/17 1613 08/20/17 1124 08/20/17 1130 08/21/17 0405  NA 132* 134* 135 136  --  136  K 4.5 4.1 3.8 3.4*  --  3.5  CL 111 109 107 104  --  106  CO2 10* 13* 17* 23  --  22  GLUCOSE 215* 230* 264* 166*  --  141*  BUN 77* 78* 77* 69*  --  68*  CREATININE 2.61* 2.86* 3.09* 2.99*  --  2.96*  CALCIUM 6.7* 7.0* 6.7* 6.8*  --  7.2*  MG 1.5*  --   --   --  1.5* 2.0  PHOS  5.7*  --   --   --  4.3 4.9*   GFR: Estimated Creatinine Clearance: 25.7 mL/min (A) (by C-G formula based on SCr of 2.96 mg/dL (H)). Liver Function Tests: Recent Labs  Lab 08/18/17 1131 08/19/17 0401 08/20/17 1124 08/21/17 0405  AST 12* 53* 67* 41  ALT 9* 24 43 39  ALKPHOS 65 50 58 79  BILITOT 0.6 1.1 0.6 0.7  PROT 6.0* 4.4* 4.4* 4.5*  ALBUMIN 2.8* 2.2* 1.9* 1.8*   No results for input(s): LIPASE, AMYLASE in the last 168 hours. No results for input(s): AMMONIA in the last 168 hours. Coagulation Profile: Recent Labs  Lab 08/18/17 2240 08/19/17 0726 08/20/17 0216 08/20/17 1124 08/21/17 0405  INR 2.30 2.52 2.18 2.80 3.04   Cardiac Enzymes: Recent Labs  Lab 08/18/17 2240 08/19/17 8372  08/19/17 1613 08/19/17 2205 08/20/17 0338  TROPONINI 0.03* 0.48* 0.62* 0.38* 0.30*   BNP (last 3 results) No results for input(s): PROBNP in the last 8760 hours. HbA1C: No results for input(s): HGBA1C in the last 72 hours. CBG: Recent Labs  Lab 08/22/17 0745 08/22/17 1122 08/22/17 1616 08/22/17 2041 08/22/17 2312  GLUCAP 138* 121* 120* 100* 127*   Lipid Profile: No results for input(s): CHOL, HDL, LDLCALC, TRIG, CHOLHDL, LDLDIRECT in the last 72 hours. Thyroid Function Tests: No results for input(s): TSH, T4TOTAL, FREET4, T3FREE, THYROIDAB in the last 72 hours. Anemia Panel: No results for input(s): VITAMINB12, FOLATE, FERRITIN, TIBC, IRON, RETICCTPCT in the last 72 hours. Sepsis Labs: No results for input(s): PROCALCITON, LATICACIDVEN in the last 168 hours.  Recent Results (from the past 240 hour(s))  Aerobic/Anaerobic Culture (surgical/deep wound)     Status: None (Preliminary result)   Collection Time: 08/18/17  6:24 PM  Result Value Ref Range Status   Specimen Description PERITONEAL CAVITY  Final   Special Requests NONE  Final   Gram Stain   Final    RARE WBC PRESENT, PREDOMINANTLY PMN NO ORGANISMS SEEN    Culture   Final    NO GROWTH 3 DAYS NO ANAEROBES  ISOLATED; CULTURE IN PROGRESS FOR 5 DAYS Performed at [facility], [address] [place], [place] [address]    Report Status PENDING  Incomplete  MRSA PCR Screening     Status: None   Collection Time: 08/18/17 11:04 PM  Result Value Ref Range Status   MRSA by PCR NEGATIVE NEGATIVE Final    Comment:        The GeneXpert MRSA Assay (FDA approved for NASAL specimens only), is one component of a comprehensive MRSA colonization surveillance program. It is not intended to diagnose MRSA infection nor to guide or monitor treatment for MRSA infections.          Radiology Studies: No results found.      Scheduled Meds: . chlorhexidine gluconate (MEDLINE KIT)  15 mL Mouth Rinse BID  . Chlorhexidine Gluconate Cloth  6 each Topical Daily  . insulin aspart  0-5 Units Subcutaneous QHS  . insulin aspart  0-9 Units Subcutaneous TID WC  . mouth rinse  15 mL Mouth Rinse QID  . pantoprazole (PROTONIX) IV  40 mg Intravenous Daily  . sodium chloride flush  10-40 mL Intracatheter Q12H   Continuous Infusions: . sodium chloride 100 mL/hr at 08/23/17 0303  . norepinephrine (LEVOPHED) Adult infusion 2 mcg/min (08/23/17 0430)  . piperacillin-tazobactam (ZOSYN)  IV Stopped (08/23/17 0230)  . vasopressin (PITRESSIN) infusion - *FOR SHOCK* Stopped (08/22/17 1733)     LOS: 5 days    Critical care time-30 minutes    Loretha Stapler, MD [facility] Pager [phone]  If 7PM-7AM, please contact night-coverage [email or web address] Password [email or web address] 08/23/2017, 7:12 AM

## 2017-08-23 NOTE — Progress Notes (Signed)
[facility] Progress Note  5 Days Post-Op  Subjective: No major events. On levophed but off vasopresin. Taking in some full liquids for comfort. Ostomy with some output.   Objective: Vital signs in last 24 hours: Temp:  [97.4 F (36.3 C)-97.8 F (36.6 C)] 97.4 F (36.3 C) (12/03 0700) Pulse Rate:  [76-96] 93 (12/03 1000) Resp:  [12-22] 22 (12/03 1000) BP: (78-120)/(45-61) 91/48 (12/03 1000) SpO2:  [97 %-100 %] 98 % (12/03 1000) Arterial Line BP: (78-109)/(32-49) 104/42 (12/03 0000) Last BM Date: 08/23/17(continuous liquid drainage from ileostomy)  Intake/Output from previous day: 12/02 0701 - 12/03 0700 In: 2440.8 [P.O.:300; I.V.:2028.3; IV Piggyback:112.5] Out: 4332 [Urine:1025; Drains:310; Stool:450] Intake/Output this shift: Total I/O In: 30 [I.V.:30] Out: -   General appearance: alert, cooperative and no distress Resp: comfortable, normal work breathing GI: soft, appropriately tender, staples c/d/i with dermabond, no erythema or drainage Ostomy with fluid in bag, JP with SS Output  Lab Results:  Recent Labs    08/21/17 0405  WBC 15.0*  HGB 8.0*  HCT 24.2*  PLT 120*   BMET Recent Labs    08/21/17 0405  NA 136  K 3.5  CL 106  CO2 22  GLUCOSE 141*  BUN 68*  CREATININE 2.96*  CALCIUM 7.2*   PT/INR Recent Labs    08/21/17 0405  LABPROT 31.2*  INR 3.04     Anti-infectives: Anti-infectives (From admission, onward)   Start     Dose/Rate Route Frequency Ordered Stop   08/19/17 0600  cefoTEtan in Dextrose 5% (CEFOTAN) IVPB 2 g     2 g Intravenous On call to O.R. 08/18/17 1522 08/18/17 1720   08/18/17 1445  piperacillin-tazobactam (ZOSYN) IVPB 3.375 g  Status:  Discontinued     3.375 g 100 mL/hr over 30 Minutes Intravenous Every 6 hours 08/18/17 1432 08/18/17 1439   08/18/17 1445  piperacillin-tazobactam (ZOSYN) IVPB 3.375 g     3.375 g 12.5 mL/hr over 240 Minutes Intravenous Every 8 hours 08/18/17 1439         Assessment/Plan: Stephen [name] is a 76 yo POD5s/p Ex lap, SBR and right hemicolectomy, end jejunostomy with likely only 150cm of small bowel remaining after ischemic bowel from his carcinoid tumor causing SMA compression. Doing fair. Wants to go to hospice. -Can continue diet as tolerated -No escalation of care -DNR -JP drain emptied q shift, removing it would result in ascites in abdomen and potentially wound breakdown which would be worse for patient     LOS: 5 days    Virl Cagey 08/23/2017

## 2017-08-23 NOTE — Consult Note (Addendum)
   [facility] Inpatient Consult   08/23/2017  Stephen [name] [facility] [birth year] [id number]   Patient screened for potential [facility] Care Management services due to multiple hospitalizations and having [employer] insurance.   Chart reviewed.  Per surgeon notes, Stephen [name] disposition plans may include hospice services. He is currently in ICU.   No identifiable [facility] Care Management needs at this time.   Will attempt to confirm plans with inpatient RNCM.    Marthenia Rolling, MSN-Ed, RN,BSN [facility] Liaison [phone]

## 2017-08-23 NOTE — Progress Notes (Signed)
Came to check on patient, Femoral arterial line not working and no waveform. Otherwise eating. BP low and levophed at 2.  Plan for hospice. Intraperitoneal cultures NGTD- stopped zosyn.  Femoral line out, RN to hold 10 minutes of continuous pressure on removal.  Curlene Labrum, MD [facility] [place], [place] [address] [phone] (office)

## 2017-08-24 DIAGNOSIS — D62 Acute posthemorrhagic anemia: Secondary | ICD-10-CM

## 2017-08-24 DIAGNOSIS — N183 Chronic kidney disease, stage 3 (moderate): Secondary | ICD-10-CM

## 2017-08-24 DIAGNOSIS — D689 Coagulation defect, unspecified: Secondary | ICD-10-CM

## 2017-08-24 LAB — AEROBIC/ANAEROBIC CULTURE (SURGICAL/DEEP WOUND): CULTURE: NO GROWTH

## 2017-08-24 LAB — AEROBIC/ANAEROBIC CULTURE W GRAM STAIN (SURGICAL/DEEP WOUND)

## 2017-08-24 LAB — GLUCOSE, CAPILLARY
GLUCOSE-CAPILLARY: 116 mg/dL — AB (ref 65–99)
GLUCOSE-CAPILLARY: 146 mg/dL — AB (ref 65–99)

## 2017-08-24 MED ORDER — MORPHINE SULFATE 20 MG/5ML PO SOLN: 2.5000 mg | ORAL | 0 refills | Status: AC | PRN

## 2017-08-24 MED ORDER — NOREPINEPHRINE 4 MG/250ML-% IV SOLN
INTRAVENOUS | Status: AC
Start: 2017-08-24 — End: 2017-08-24
  Filled 2017-08-24: qty 250

## 2017-08-24 NOTE — Progress Notes (Signed)
[facility]  No major changes. Eating. Remains in levophed. Femoral line removed yesterday without issues. No complaints. Family at bedside. Plan for hospice house today if bed available.  Curlene Labrum, MD [facility] [address] [place], [place] [address] [phone] (office)

## 2017-08-24 NOTE — Progress Notes (Deleted)
Cardiology Office Note   Date:  08/24/2017   ID:  Stephen [name], DOB 07-15-1941, MRN [id number]  PCP:  [name], MD  Cardiologist:  Satira Sark, MD No chief complaint on file.    History of Present Illness: Stephen [name] is a 76 y.o. male who presents for very disease status post bilateral CEA, paroxysmal atrial fibrillation, hypertension, [place] CVA, hyperlipidemia, and chronic kidney disease stage III.  Patient was seen during recent hospitalization for preoperative evaluation for gastrojejunostomy.. Patient was cleared for surgery and had this completed which required NG tube decompression of the stomach due to obstruction.    Past Medical History:  Diagnosis Date  . Anemia of chronic renal failure, stage 3 (moderate) (HCC)   . Atrial fibrillation ([place])    Documented 08/2016  . B12 deficiency 08/17/2016  . Cancer ([place])    carcinoma of bowelcancer  . Carotid artery occlusion    Right carotid endarterectomy 2001; left carotid endarterectomy in 2008  . Celiac disease   . Chronic renal disease, stage 3, moderately decreased glomerular filtration rate between 30-59 mL/min/1.73 square meter ([place]) 06/01/2012  . Coronary atherosclerosis of native coronary artery    PTCA of LAD in 1994; total obstruction of the RCA treated medically; subsequent stress nuclear study with inferior ischemia  . DDD (degenerative disc disease), lumbar    Lumbar surgery in 1984  . Diabetes mellitus type II   . Essential hypertension   . History of stroke    Right brain 2001; right PCA, MCA/PCA, left SCA infarcts 08/2017 with atrial fibrillation  . Hyperlipidemia   . Iron deficiency anemia 12/31/2016  . Peripheral vascular disease [facility])     Past Surgical History:  Procedure Laterality Date  . APPENDECTOMY  1970  . BOWEL RESECTION  08/02/2017   Procedure: SMALL BOWEL RESECTION;  Surgeon: Virl Cagey, MD;  Location: [facility] ORS;  Service: General;;  . BOWEL RESECTION N/A 08/18/2017   Procedure: SMALL BOWEL RESECTION;  Surgeon: Virl Cagey, MD;  Location: [facility] ORS;  Service: General;  Laterality: N/A;  . CAROTID ENDARTERECTOMY  2008   Left   . CAROTID ENDARTERECTOMY  06/15/2000   Right  . COLON SURGERY    . COLONOSCOPY N/A 10/10/2015   Procedure: COLONOSCOPY;  Surgeon: Rogene Houston, MD;  Location: [facility] SU[facility];  Service: Endoscopy;  Laterality: N/A;  12:25 - moved to 11:15 - Ann to notify  . GASTROJEJUNOSTOMY  08/02/2017   Procedure: GASTROJEJUNOSTOMY;  Surgeon: Virl Cagey, MD;  Location: [facility];  Service: General;;  . LAPAROTOMY Right 08/18/2017   Procedure: EXPLORATORY LAPAROTOMY WITH HEMI COLLECTOMY;  Surgeon: Virl Cagey, MD;  Location: [facility];  Service: General;  Laterality: Right;  . LIVER BIOPSY  08/02/2017   Procedure: LIVER BIOPSY;  Surgeon: Virl Cagey, MD;  Location: [facility];  Service: General;;  . LUMBAR [place] SURGERY  2000, 2001   L5 HNP required 2 surgical procedures  . OSTOMY N/A 08/18/2017   Procedure: OSTOMY;  Surgeon: Virl Cagey, MD;  Location: [facility];  Service: General;  Laterality: N/A;     Current Outpatient Medications  Medication Sig Dispense Refill  . morphine 20 MG/5ML solution Take 0.6 mLs (2.4 mg total) by mouth every 2 (two) hours as needed for pain. 10 mL 0   No current facility-administered medications for this visit.    Facility-Administered Medications Ordered in Other Visits  Medication Dose Route Frequency Provider Last Rate Last Dose  . 0.9 %  sodium chloride infusion   Intravenous Continuous Baird Cancer, PA-C 10 mL/hr at 08/31/16 4650    . 0.9 %  sodium chloride infusion   Intravenous Continuous Virl Cagey, MD   Stopped at 08/24/17 1300  . acetaminophen (TYLENOL) tablet 650 mg  650 mg Oral Q6H PRN Eber Jones, MD       Or  . acetaminophen (TYLENOL) suppository 650 mg  650 mg Rectal Q6H PRN Eber Jones, MD      . acetaminophen (TYLENOL) tablet 650 mg  650 mg  Oral Once [place], Kelby Fam, MD      . HYDROmorphone (DILAUDID) injection 0.5 mg  0.5 mg Intravenous Q2H PRN Virl Cagey, MD      . insulin aspart (novoLOG) injection 0-9 Units  0-9 Units Subcutaneous TID WC Eber Jones, MD   1 Units at 08/24/17 1144  . MEDLINE mouth rinse  15 mL Mouth Rinse BID Eber Jones, MD   15 mL at 08/24/17 0912  . morphine 2 MG/ML injection 2 mg  2 mg Intravenous Q1H PRN Virl Cagey, MD   2 mg at 08/24/17 1248  . norepinephrine (LEVOPHED) 4 mg in dextrose 5 % 250 mL (0.016 mg/mL) infusion  0-40 mcg/min Intravenous Titrated Quinn Axe A, NP   Stopped at 08/24/17 1142  . ondansetron (ZOFRAN) tablet 4 mg  4 mg Oral Q6H PRN Eber Jones, MD       Or  . ondansetron [facility]) injection 4 mg  4 mg Intravenous Q6H PRN Eber Jones, MD      . phenol (CHLORASEPTIC) mouth spray 1 spray  1 spray Mouth/Throat PRN Virl Cagey, MD   1 spray at 08/21/17 1322  . sodium chloride flush (NS) 0.9 % injection 10-40 mL  10-40 mL Intracatheter PRN Eber Jones, MD        Allergies:   Patient has no known allergies.    Social History:  The patient  reports that he quit smoking about 44 years ago. His smoking use included cigarettes. He has a 30.00 pack-year smoking history. he has never used smokeless tobacco. He reports that he does not drink alcohol or use drugs.   Family History:  The patient's family history includes Diabetes in his brother, daughter, mother, sister, and son; Heart attack in his mother; Heart disease in his brother and mother; Hyperlipidemia in his son.    ROS: All other systems are reviewed and negative. Unless otherwise mentioned in H&P    PHYSICAL EXAM: VS:  There were no vitals taken for this visit. , BMI There is no height or weight on file to calculate BMI. GEN: Well nourished, well developed, in no acute distress  HEENT: normal  Neck: no JVD, carotid bruits, or masses Cardiac: ***RRR; no  murmurs, rubs, or gallops,no edema  Respiratory:  clear to auscultation bilaterally, normal work of breathing GI: soft, nontender, nondistended, + BS MS: no deformity or atrophy  Skin: warm and dry, no rash Neuro:  Strength and sensation are intact Psych: euthymic mood, full affect   EKG:  EKG {ACTION; IS/IS PTW:[id number]} ordered today. The ekg ordered today demonstrates ***   Recent Labs: 07/07/2017: TSH 2.150 08/21/2017: ALT 39; BUN 68; Creatinine, Ser 2.96; Hemoglobin 8.0; Magnesium 2.0; Platelets 120; Potassium 3.5; Sodium 136    Lipid Panel    Component Value Date/Time   CHOL 121 09/05/2016 0252   TRIG 254 (H) 09/05/2016 0252   HDL 37 (L) 09/05/2016  0252   CHOLHDL 3.3 09/05/2016 0252   VLDL 51 (H) 09/05/2016 0252   LDLCALC 33 09/05/2016 0252      Wt Readings from Last 3 Encounters:  08/24/17 224 lb 3.3 oz (101.7 kg)  08/10/17 190 lb 11.2 oz (86.5 kg)  07/22/17 200 lb 1.6 oz (90.8 kg)      Other studies Reviewed: Additional studies/ records that were reviewed today include: ***. Review of the above records demonstrates: ***   ASSESSMENT AND PLAN:  1.  ***   Current medicines are reviewed at length with the patient today.    Labs/ tests ordered today include: *** Phill Myron. [place] Pugh, ANP, AACC   08/24/2017 4:57 PM    [facility] [address], [place], [place] [address] Phone: [phone]; Fax: [phone]

## 2017-08-24 NOTE — Clinical Social Work Note (Addendum)
CSW notified that pt has elected to seek transfer to [facility]. Met with pt and his family members (present with pt consent) to discuss referral. Pt confirms that he would like a referral to the hospice residence. Pt states that MD indicated pt could transfer today if the residence is able to accept. Referral started. Updated pt's RN. Will follow up once response from [facility] is received.  12:28 Pt is accepted for care at [facility]. Pt's daughter heading to the facility to complete admission paperwork. LCSW routing dc information to Hospice through [employer]. Pt will transfer by ambulance when ready. Pt's RN working with MD to get DNR paperwork complete. RN will call report to the hospice home. Envelope for transfer provided to RN. There are no other SW needs for dc.

## 2017-08-24 NOTE — Progress Notes (Signed)
Pt being discharged to hospice home of [facility]. Family at bedside and aware. Report given to RN at hospice. Pt leaving facility with JP drain and foley in place. Midline incision dressing clean, dry, intact. JP drain, foley and ostomy bag emptied prior to transport. CVC removed; no complications. Belongings sent with daughter Shirlean Mylar. EMS to transport pt to hospice home along with discharge orders and prescription.

## 2017-08-24 NOTE — Discharge Summary (Addendum)
Physician Discharge Summary  Stephen [name] [facility] XNA:[id number] DOB: 11/09/1940 DOA: 08/18/2017  PCP: [name], MD  Admit date: 08/18/2017 Discharge date: 08/24/2017  Admitted From: Home Disposition: Hospice home  Recommendations for Outpatient Follow-up:  1. Follow-up at residential hospice  Equipment/Devices: Oxygen Discharge Condition:*Guarded CODE STATUS: DO NOT RESUSCITATE Diet recommendation: Nothing by mouth, comfort feeds if tolerated    Discharge Diagnoses:  Principal Problem:   Intra-abdominal free air of unknown etiology   Hypovolemic shock  Active Problems:   Diabetes mellitus, type II ([place])   Essential hypertension   Hyperlipidemia   CKD (chronic kidney disease) stage 3, GFR 30-59 ml/min (HCC)   Metastatic malignant carcinoid tumor to liver (HCC)   Pressure injury of skin   Coagulopathy (HCC)   Acute blood loss anemia   Goals of care, counseling/discussion   Palliative care encounter   Brief narrative/history of present illness Please refer to admission H&P for details, in brief, Stephen Peake Smitheyis a 76 y.o.malewith medical history significant ofmetastici carcinoid tumor who recently underwent gastrojejunostomy and small bowel resection 16 days ago, CKF Stage III, Atrial fibrillation on eliquis presented to the ED with syncope. Per Dr. Constance Haw' History and physical patient voiced he was having slightly decreased PO intake and diarrhea. He underwent CT scan of the abdomen and began having abdominal pain. No further history obtainable as patient in significant pain and prepping for surgery.  ED Course:Patient found to be hypotensive- received 2L boluses. General surgeon Dr. Constance Haw evaluated after CT scan results showing free air, bowel wall thickening and concern for ischemia. Of note his labs show WBC of 8.5, H/H 8.8/28.6, Plt of 224. Creatinine of 2.97 (baseline appears to be between 2.2-2.3). Patient to be admitted to ICU and to undergo surgery  emergently.  Patient underwent Palliative exploratory laparotomy, distal small bowel resection (150cm) and right hemicolectomy, drain placement around the gastrojejunostomy on 08/18/2017.  During his surgical course in the operating room he developed numerous cardiac arrhythmias as well as hypotension which was treated with a 3 unit PRBC transfusion as well as numerous pressure medications he was transferred to the ICU in critical condition.  He remained intubated and on 08/19/2017 palliative care was consulted for further discussion with patient's family about goals of care.  Given overall guarded prognosis family decided on making patient comfort care with goal on transition to residential hospice.   Hospital course Intra abdominal free air s/p Palliative exploratory laparotomy, distal small bowel resection (150cm) and right hemicolectomy, drain placement around the gastrojejunostomy -Status post 5 units PRBC transfusion -Status post 3 units of FFP  Hypovolemic shock -Vasopressin weaned. Weaned off Levophed this morning and systolic blood pressure holding in the 90s. -As noted above after extensive discussion with patient and his family they have agreed on transitioning patient to residential hospice for comfort.  Patient has been accepted to residential hospice and will be discharged there today. Ordered oral morphine Evert Kohl as needed for comfort.  Atrial fibrillation Discontinue anticoagulation.   Chronic kidney disease Renal function stable.   type 2 diabetes mellitus Not on any medications  Hyperlipidemia Discontinue statin.  Consults: Surgery, palliative care  Procedure: Exploratory laparotomy  Disposition: Hospice   family communication: Daughter at bedside  Discharge Instructions   Allergies as of 08/24/2017   No Known Allergies     Medication List    STOP taking these medications   acetaminophen 500 MG tablet Commonly known as:  TYLENOL   allopurinol  100 MG tablet Commonly known as:  ZYLOPRIM   apixaban 5 MG Tabs tablet Commonly known as:  ELIQUIS   aspirin 81 MG tablet   atenolol 25 MG tablet Commonly known as:  TENORMIN   docusate sodium 100 MG capsule Commonly known as:  COLACE   EQ DAILY FIBER PO   feeding supplement (ENSURE ENLIVE) Liqd   furosemide 40 MG tablet Commonly known as:  LASIX   insulin detemir 100 UNIT/ML injection Commonly known as:  LEVEMIR   losartan 25 MG tablet Commonly known as:  COZAAR   Melatonin 10 MG Caps   multivitamin with minerals Tabs tablet   OVER THE COUNTER MEDICATION   oxyCODONE-acetaminophen 5-325 MG tablet Commonly known as:  PERCOCET/ROXICET   pantoprazole 40 MG tablet Commonly known as:  PROTONIX   pravastatin 40 MG tablet Commonly known as:  PRAVACHOL   PROBIOTIC DAILY PO   prochlorperazine 10 MG tablet Commonly known as:  COMPAZINE   sodium bicarbonate 650 MG tablet   vitamin B-12 1000 MCG tablet Commonly known as:  CYANOCOBALAMIN   Vitamin D-3 1000 units Caps     TAKE these medications   morphine 20 MG/5ML solution Take 0.6 mLs (2.4 mg total) by mouth every 2 (two) hours as needed for pain.      Follow-up Information    residential hospice Follow up.          No Known Allergies    Procedures/Studies: Ct Abdomen Pelvis Wo Contrast  Result Date: 08/18/2017 CLINICAL DATA:  Weakness and diarrhea since May 2018, metastatic malignant carcinoid tumor, post gastrojejunostomy and partial small-bowel resection on 08/02/2017, near syncope today ; history coronary artery disease, type II diabetes mellitus, essential hypertension, peripheral vascular disease EXAM: CT ABDOMEN AND PELVIS WITHOUT CONTRAST TECHNIQUE: Multidetector CT imaging of the abdomen and pelvis was performed following the standard protocol without IV contrast. Sagittal and coronal MPR images reconstructed from axial data set. COMPARISON:  07/28/2017 FINDINGS: Lower chest: Mild atelectasis at  lung bases. Tiny nodular foci at lung bases unchanged. Hepatobiliary: Liver unremarkable.  Gallbladder unremarkable. Pancreas: Normal appearance Spleen: Normal appearance Adrenals/Urinary Tract: Adrenal glands normal appearance. BILATERAL nonobstructing renal calculi largest LEFT kidney 12 mm diameter image 46. No definite hydronephrosis or hydroureter. Bladder unremarkable. Stomach/Bowel: Prior gastrojejunostomy. Stomach incompletely distended, limiting assessment of wall thickness but appears minimally thickened. Diffuse thickening of the proximal jejunum as well as additional scattered bowel loops, could be postsurgical though wall thickening can also be seen with infection and ischemia. Significant amount of associated free intraperitoneal air and fluid is atypical for 16 days post abdominal surgery and consistent with perforated viscus. A discrete anastomotic leak is not seen. Source of perforation is not localized. Colon decompressed without wall thickening. Vascular/Lymphatic: Extensive atherosclerotic calcifications of aorta, abdominal branch vessels common iliac arteries and femoral arteries. Coronary retro calcifications noted. Reproductive: Few prostatic calcifications, nonspecific Other: Tiny umbilical hernia. Significant free intraperitoneal air and free fluid as above. Scattered edema of intra-abdominal and subcutaneous tissue planes. Scattered mesenteric edema with again identified calcified mesenteric mass within the small bowel mesenteries compatible with known carcinoid tumor. Additional partially calcified soft tissue density is seen LEFT para-aortic which may represent either adenopathy or direct tumor extension, Musculoskeletal: Osseous demineralization with degenerative changes of the lumbar spine. IMPRESSION: Extensive free air in free fluid, atypical for 16 days post abdominal surgery compatible with a perforated viscus. Site of perforation is uncertain, without discrete localization of a  bowel perforation or anastomotic leak by CT. Bowel wall thickening at the gastrojejunostomy anastomosis but  as well throughout multiple small bowel loops in the upper abdomen and pelvis. In light of the patient's extensive atherosclerotic disease and identification of ischemic bowel at surgery, findings are concerning for small bowel ischemia though wall thickening can also be seen with prolonged handling of bowel at surgery, infection, and inflammatory bowel disease. Known partially calcified mass within the mesenteric compatible with carcinoid tumor with tumor extension versus adenopathy LEFT para-aortic. Critical Value/emergent results were called by telephone at the time of interpretation on 08/18/2017 at 2:47 pm to Dr. Curlene Labrum, who verbally acknowledged these results. Electronically Signed   By: Lavonia Dana M.D.   On: 08/18/2017 14:49   Ct Abdomen Pelvis Wo Contrast  Result Date: 07/28/2017 CLINICAL DATA:  Diarrhea for months. Sudden severe nausea and vomiting all day today. Abdominal pain. EXAM: CT ABDOMEN AND PELVIS WITHOUT CONTRAST TECHNIQUE: Multidetector CT imaging of the abdomen and pelvis was performed following the standard protocol without IV contrast. COMPARISON:  11/18/2006 FINDINGS: Lower chest: 4 mm subpleural nodule in the right lung base. 4 mm subpleural nodule in the left lung base. These nodules were not seen previously. Several additional smaller nodules are demonstrated. Hepatobiliary: No focal liver lesions are identified. The gallbladder is distended but there is no wall thickening or inflammatory change. No radiopaque stones are demonstrated. Pancreas: Unremarkable. No pancreatic ductal dilatation or surrounding inflammatory changes. Spleen: Normal in size without focal abnormality. Adrenals/Urinary Tract: No adrenal gland nodules. Bilateral intrarenal stones. Largest is in the left lower pole measuring 12 mm in diameter. No hydronephrosis or hydroureter. No ureteral stones are  demonstrated. Bladder wall is not thickened. No bladder filling defects. Stomach/Bowel: The stomach is markedly distended, mostly with fluid. No gastric wall thickening is identified. The proximal duodenum is distended. Transition zone appears to be at the second portion of the duodenum as it crosses beneath the superior mesenteric artery. Remainder of the small bowel is decompressed. Scattered stool in the colon. No colonic distention or wall thickening. Appendix is not identified. Vascular/Lymphatic: Calcification of the abdominal aorta. No aneurysm. There is retroperitoneal and periaortic lymphadenopathy with lymph nodes measuring up to about 15 mm diameter. Some of the lymph nodes are calcified. Prominent celiac axis and mesenteric lymphadenopathy. There is a partially calcified mass centrally in the mesentery and surrounding the superior mesenteric artery. The mass measures about 3.3 x 5.5 cm in diameter. Desmoplastic response with radiating scars into the mesentery. Reproductive: Prostate gland is not enlarged. Prostate calcifications are present. Other: No free air in the abdomen. Small amount of free fluid around the liver edge. Abdominal wall musculature appears intact. Musculoskeletal: Degenerative changes in the spine. No destructive bone lesions. IMPRESSION: 1. Large partially calcified mass in the root of the mesentery with radiating scarring. Additional calcified and noncalcified lymphadenopathy in the retroperitoneum and celiac axis. Differential diagnosis would include carcinoid tumor, sclerosing mesenteritis, or possibly lymphoma. 2. Marked distention of the stomach and proximal duodenum with transition zone at the second- third portion of the duodenum. Obstruction is likely caused by the mesenteric process described above. 3. Distended gallbladder without evidence of cholelithiasis. 4. Multiple bilateral nonobstructing intrarenal stones. 5. Small amount of free fluid around the liver edge. 6.  Aortic calcifications. 7. Small scattered nodules in the lung bases, largest measuring 4 mm. No follow-up needed if patient is low-risk (and has no known or suspected primary neoplasm). Non-contrast chest CT can be considered in 12 months if patient is high-risk. This recommendation follows the consensus statement: Guidelines for Management of  Incidental Pulmonary Nodules Detected on CT Images: From the Fleischner Society 2017; Radiology 2017; 284:228-243. Electronically Signed   By: Lucienne Capers M.D.   On: 07/28/2017 21:27   Dg Abd 1 View  Result Date: 08/07/2017 CLINICAL DATA:  History of bypass gastrojejunostomy. Evaluate for a air or contrast moving into small bowel EXAM: ABDOMEN - 1 VIEW COMPARISON:  Upper GI 08/06/2017 FINDINGS: Unable to assess for patency of the gastrojejunostomy. NG tube tip is present in the mid stomach. Nonobstructive bowel gas pattern noted. No free air organomegaly. Left lower pole nephrolithiasis noted. IMPRESSION: Nonobstructive bowel gas pattern. NG tube tip remains in the mid stomach. Cannot determine patency of gastrojejunostomy on this plain film study. Electronically Signed   By: Rolm Baptise M.D.   On: 08/07/2017 07:56   Dg Abd 1 View  Result Date: 08/02/2017 CLINICAL DATA:  76 year old male status post NG tube placement. Partially calcified mesenteric mass. EXAM: ABDOMEN - 1 VIEW COMPARISON:  CT Abdomen and Pelvis 07/28/2017 FINDINGS: Portable AP supine view at 0915 hours. Enteric tube courses to the abdomen and side hole is at the level of the proximal gastric body (arrow). Paucity bowel gas otherwise. Coarse calcifications in the left abdomen correspond to nephrolithiasis. Coarse calcifications in the right upper quadrant correspond a right upper pole nephrolithiasis. Negative lung bases. Endplate degeneration in the spine. No acute osseous abnormality identified. IMPRESSION: 1. NG tube side hole at the level of the proximal gastric body. 2. Nephrolithiasis.   Paucity of bowel gas. Electronically Signed   By: Genevie Ann M.D.   On: 08/02/2017 09:38   US Renal  Result Date: 08/05/2017 CLINICAL DATA:  Acute renal disease superimposed on chronic renal disease. EXAM: RENAL / URINARY TRACT ULTRASOUND COMPLETE COMPARISON:  Abdominal series 11 08/2017. CT 07/28/2017. Ultrasound 03/02/2017 . FINDINGS: Right Kidney: Length: 10.5 cm. Echogenicity within normal limits. Cortical irregularity suggesting scarring . No mass or hydronephrosis visualized. Left Kidney: Length: 11.4 cm. Echogenicity within normal limits. Cortical irregularity suggesting scarring . No mass or hydronephrosis visualized. 13 mm nonobstructing stone. Bladder: A Foley catheter noted.  Bladder is nondistended. Incidental note is made of increased hepatic echogenicity consistent fatty infiltration and/or hepatocellular disease. Mild ascites incidentally noted. IMPRESSION: 1.  Bilateral renal cortical irregularity suggesting scarring. 2. 13 mm nonobstructing left renal stone. No acute renal abnormality identified. No hydronephrosis. 3. Incidental note is made of increased hepatic echogenicity consistent fatty infiltration and/or hepatocellular disease. Mild ascites is incidentally noted . Electronically Signed   By: Marcello Moores  Register   On: 08/05/2017 12:58   Dg Chest Port 1 View  Result Date: 08/18/2017 CLINICAL DATA:  Central line placement EXAM: PORTABLE CHEST 1 VIEW COMPARISON:  08/18/2017 1150 hours FINDINGS: Lungs are under aerated with bibasilar atelectasis. Right subclavian central venous catheter place. Tip is at the cavoatrial junction. No evidence of pneumothorax. Normal heart size. IMPRESSION: Right subclavian vein central venous catheter placed with its tip at the cavoatrial junction and no pneumothorax Bibasilar atelectasis and low lung volumes. Electronically Signed   By: Marybelle Killings M.D.   On: 08/18/2017 17:03   Dg Chest Portable 1 View  Result Date: 07/28/2017 CLINICAL DATA:  NG tube  placement EXAM: PORTABLE CHEST 1 VIEW COMPARISON:  07/09/2015 FINDINGS: Minimal bibasilar atelectasis. Cardiomediastinal silhouette within normal limits. Aortic atherosclerosis. No pneumothorax. Esophageal tube courses toward the diaphragm but the tip is poorly visible, possibly over the GE junction. Gaseous enlargement of the stomach IMPRESSION: 1. Minimal bibasilar atelectasis 2. Poor visibility of the  tip of the esophageal tube, it possibly projects over the distal esophagus. The stomach appears enlarged. Electronically Signed   By: Donavan Foil M.D.   On: 07/28/2017 23:20   Dg Chest Port 1v Same Day  Result Date: 08/18/2017 CLINICAL DATA:  Status post intubation. History of bowel cancer, DM, HTN, A-FIB, Renal disease, stroke, coronary atherosclerosis. EXAM: PORTABLE CHEST 1 VIEW COMPARISON:  08/18/2017 FINDINGS: Status post placement of endotracheal tube, tip approximately 5.5 cm above the carina. Nasogastric tube is in place with tip beyond the image beyond the gastroesophageal junction. A right subclavian central line tip overlies the level of superior vena cava. The heart size is normal. There is minimal right lower lobe atelectasis and probable trace effusion. IMPRESSION: Interval placement of endotracheal tube, and nasogastric tube. Small right pleural effusion and basilar atelectasis. Electronically Signed   By: Nolon Nations M.D.   On: 08/18/2017 21:09   Dg Knee Right Port  Result Date: 07/31/2017 CLINICAL DATA:  Pain and swelling for several days EXAM: PORTABLE RIGHT KNEE - 1-2 VIEW COMPARISON:  None. FINDINGS: no acute displaced fracture or malalignment is seen. Mild patellofemoral degenerative changes. Joint space calcifications. Prepatellar calcifications and oval soft tissue thickening suprapatellar superficial soft tissues. Vascular calcification IMPRESSION: 1. No acute fracture or malalignment 2. Mild degenerative changes.  Chondrocalcinosis 3. Soft tissue thickening and  calcifications anterior to the patella suggesting prepatellar bursitis. Electronically Signed   By: Donavan Foil M.D.   On: 07/31/2017 17:59   Dg Abd 2 Views  Result Date: 08/05/2017 CLINICAL DATA:  Abdomen pain with emesis and diarrhea, status post laparotomy 08/02/2017 EXAM: ABDOMEN - 2 VIEW COMPARISON:  07/28/2017, 08/02/2017 FINDINGS: Supine and upright views of the abdomen demonstrate small to moderate free air beneath the right greater than left diaphragm. Esophageal tube tip overlies the proximal stomach. Midline cutaneous staples. Mild gaseous enlargement of small bowel up to 3.8 cm in the right mid abdomen. Paucity of distal gas. Scattered fluid levels. Calcifications overlying the left kidney. IMPRESSION: 1. Small moderate pneumoperitoneum, probably related to recent laparotomy ; radiographic follow-up may be performed to insure that this is decreasing as opposed increasing given the quantity under the right hemidiaphragm. 2. Mild gaseous enlargement of right lower quadrant small bowel with fluid levels suspicious for a mild ileus. 3. Left kidney stones Electronically Signed   By: Donavan Foil M.D.   On: 08/05/2017 15:08   Dg Abd Acute W/chest  Result Date: 08/18/2017 CLINICAL DATA:  Diarrhea, weakness. EXAM: DG ABDOMEN ACUTE W/ 1V CHEST COMPARISON:  Radiographs of August 07, 2017. CT scan of July 28, 2017. FINDINGS: No abnormal bowel gas pattern is noted. Left nephrolithiasis is noted. Phleboliths are noted in the pelvis. Midline surgical staples are noted pneumoperitoneum is noted under both hemidiaphragms which may be due to patient's postoperative status. Heart size and mediastinal contours are within normal limits. Both lungs are clear. IMPRESSION: No evidence of bowel obstruction or ileus. No acute cardiopulmonary disease. Left nephrolithiasis is noted. Pneumoperitoneum is noted under both hemidiaphragms which may be due to patient's postoperative status, although according to  ordering physician, this procedure was 15 days ago. The amount of air that remain is concerning for possible rupture of hollow viscus. Unenhanced CT scan of the abdomen pelvis may be performed for further evaluation. Critical Value/emergent results were called by telephone at the time of interpretation on 08/18/2017 at 12:31 pm to Dr. Orlie Dakin , who verbally acknowledged these results. Electronically Signed   By: Jeneen Rinks  Murlean Caller, M.D.   On: 08/18/2017 12:31   Dg Duanne Limerick  W/kub  Result Date: 08/06/2017 CLINICAL DATA:  Recent bypass gastric jejunostomy. 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 Evaluate for leak EXAM: WATER SOLUBLE UPPER GI SERIES TECHNIQUE: Single-column upper GI series was performed using water soluble contrast. CONTRAST:  350 mL p.O. <See Chart> ISOVUE-300 IOPAMIDOL (ISOVUE-300) INJECTION 61% COMPARISON:  CT 07/28/2017 FLUOROSCOPY TIME:  Fluoroscopy Time:  3 minutes 18 seconds Radiation Exposure Index (if provided by the fluoroscopic device): Number of Acquired Spot Images: 0 FINDINGS: Isovue contrast was injected through the NG tube to fill the stomach. Stomach is normal in size and contour. Stomach empties into the duodenum. The duodenum appears obstructed in the third portion as noted on prior CT due to tumor in the mesenteric. No filling was seen in the gastrojejunostomy. Patient was positioned right lateral, left lateral, and supine, and with delayed imaging there was no filling of the gastrojejunostomy. On the final AP image, there appears to be some edema along the greater curve of the stomach which may be the anastomosis however no significant contrast is noted in the gastric jejunostomy. This could be positional or due to edema from recent surgery. Negative for extravasation of contrast.  No leak. IMPRESSION: The stomach was filled with contrast. There is obstruction of the third portion the duodenum due to tumor. No filling of the gastric jejunostomy. This could be due to edema at  the anastomosis. Negative for leak. Electronically Signed   By: Franchot Gallo M.D.   On: 08/06/2017 10:48       Subjective: Reports pain to be stable this morning. Denies any difficulty breathing.  Discharge Exam: Vitals:   08/24/17 1100 08/24/17 1130  BP: (!) 82/60 95/60  Pulse: 95 (!) 111  Resp: 15 (!) 21  Temp:    SpO2: 100% 97%   Vitals:   08/24/17 1015 08/24/17 1030 08/24/17 1100 08/24/17 1130  BP: 104/60 (!) 97/56 (!) 82/60 95/60  Pulse: 92 (!) 105 95 (!) 111  Resp: (!) 21 (!) 23 15 (!) 21  Temp:      TempSrc:      SpO2: 99% 97% 100% 97%  Weight:      Height:         Gen.: Elderly male appears fatigued, HEENT: Pallor present, no icterus, moist mucosa, supple neck Chest: Diminished bilateral breath sounds CVS: S1 and S2 irregular, no murmurs rub or gallop GI: Soft, mild distention, abdominal wall dressing with drain in place., Foley + Musculoskeletal: Warm, pitting edema bilaterally CNS: Alert and awake     The results of significant diagnostics from this hospitalization (including imaging, microbiology, ancillary and laboratory) are listed below for reference.     Microbiology: Recent Results (from the past 240 hour(s))  Aerobic/Anaerobic Culture (surgical/deep wound)     Status: None (Preliminary result)   Collection Time: 08/18/17  6:24 PM  Result Value Ref Range Status   Specimen Description PERITONEAL CAVITY  Final   Special Requests NONE  Final   Gram Stain   Final    RARE WBC PRESENT, PREDOMINANTLY PMN NO ORGANISMS SEEN    Culture   Final    NO GROWTH 4 DAYS NO ANAEROBES ISOLATED; CULTURE IN PROGRESS FOR 5 DAYS Performed at [facility], [address] [place], [place] [address]    Report Status PENDING  Incomplete  MRSA PCR Screening     Status: None   Collection Time: 08/18/17 11:04 PM  Result Value Ref  Range Status   MRSA by PCR NEGATIVE NEGATIVE Final    Comment:        The GeneXpert MRSA Assay (FDA approved for NASAL  specimens only), is one component of a comprehensive MRSA colonization surveillance program. It is not intended to diagnose MRSA infection nor to guide or monitor treatment for MRSA infections.      Labs: BNP (last 3 results) No results for input(s): BNP in the last 8760 hours. Basic Metabolic Panel: Recent Labs  Lab 08/18/17 2240 08/19/17 0401 08/19/17 1613 08/20/17 1124 08/20/17 1130 08/21/17 0405  NA 132* 134* 135 136  --  136  K 4.5 4.1 3.8 3.4*  --  3.5  CL 111 109 107 104  --  106  CO2 10* 13* 17* 23  --  22  GLUCOSE 215* 230* 264* 166*  --  141*  BUN 77* 78* 77* 69*  --  68*  CREATININE 2.61* 2.86* 3.09* 2.99*  --  2.96*  CALCIUM 6.7* 7.0* 6.7* 6.8*  --  7.2*  MG 1.5*  --   --   --  1.5* 2.0  PHOS 5.7*  --   --   --  4.3 4.9*   Liver Function Tests: Recent Labs  Lab 08/18/17 1131 08/19/17 0401 08/20/17 1124 08/21/17 0405  AST 12* 53* 67* 41  ALT 9* 24 43 39  ALKPHOS 65 50 58 79  BILITOT 0.6 1.1 0.6 0.7  PROT 6.0* 4.4* 4.4* 4.5*  ALBUMIN 2.8* 2.2* 1.9* 1.8*   No results for input(s): LIPASE, AMYLASE in the last 168 hours. No results for input(s): AMMONIA in the last 168 hours. CBC: Recent Labs  Lab 08/18/17 1131 08/18/17 2240 08/19/17 0401 08/19/17 0718 08/19/17 1613 08/20/17 0216 08/20/17 1124 08/21/17 0405  WBC 8.5 12.7* 16.4*  --  13.2*  --   --  15.0*  NEUTROABS 7.4 10.9*  --   --   --   --   --  13.0*  HGB 8.8* 8.6* 7.5* 7.2* 6.6* 8.0* 7.6* 8.0*  HCT 28.6* 27.4* 23.5* 22.2* 20.3* 23.6* 22.4* 24.2*  MCV 97.6 92.3 91.8  --  88.6  --   --  89.0  PLT 224 188 212  --  183  --   --  120*   Cardiac Enzymes: Recent Labs  Lab 08/18/17 2240 08/19/17 0718 08/19/17 1613 08/19/17 2205 08/20/17 0338  TROPONINI 0.03* 0.48* 0.62* 0.38* 0.30*   BNP: Invalid input(s): POCBNP CBG: Recent Labs  Lab 08/23/17 1203 08/23/17 1632 08/23/17 2104 08/24/17 0802 08/24/17 1126  GLUCAP 122* 139* 125* 116* 146*   D-Dimer No results for input(s):  DDIMER in the last 72 hours. Hgb A1c No results for input(s): HGBA1C in the last 72 hours. Lipid Profile No results for input(s): CHOL, HDL, LDLCALC, TRIG, CHOLHDL, LDLDIRECT in the last 72 hours. Thyroid function studies No results for input(s): TSH, T4TOTAL, T3FREE, THYROIDAB in the last 72 hours.  Invalid input(s): FREET3 Anemia work up No results for input(s): VITAMINB12, FOLATE, FERRITIN, TIBC, IRON, RETICCTPCT in the last 72 hours. Urinalysis    Component Value Date/Time   COLORURINE YELLOW 08/18/2017 1104   APPEARANCEUR HAZY (A) 08/18/2017 1104   LABSPEC 1.011 08/18/2017 1104   PHURINE 5.0 08/18/2017 1104   GLUCOSEU NEGATIVE 08/18/2017 1104   HGBUR SMALL (A) 08/18/2017 1104   BILIRUBINUR NEGATIVE 08/18/2017 1104   [place] 08/18/2017 1104   PROTEINUR NEGATIVE 08/18/2017 1104   UROBILINOGEN 0.2 07/05/2008 0922   NITRITE NEGATIVE 08/18/2017 1104  LEUKOCYTESUR TRACE (A) 08/18/2017 1104   Sepsis Labs Invalid input(s): PROCALCITONIN,  WBC,  LACTICIDVEN Microbiology Recent Results (from the past 240 hour(s))  Aerobic/Anaerobic Culture (surgical/deep wound)     Status: None (Preliminary result)   Collection Time: 08/18/17  6:24 PM  Result Value Ref Range Status   Specimen Description PERITONEAL CAVITY  Final   Special Requests NONE  Final   Gram Stain   Final    RARE WBC PRESENT, PREDOMINANTLY PMN NO ORGANISMS SEEN    Culture   Final    NO GROWTH 4 DAYS NO ANAEROBES ISOLATED; CULTURE IN PROGRESS FOR 5 DAYS Performed at [facility], [address] [place], [place] [address]    Report Status PENDING  Incomplete  MRSA PCR Screening     Status: None   Collection Time: 08/18/17 11:04 PM  Result Value Ref Range Status   MRSA by PCR NEGATIVE NEGATIVE Final    Comment:        The GeneXpert MRSA Assay (FDA approved for NASAL specimens only), is one component of a comprehensive MRSA colonization surveillance program. It is not intended to diagnose  MRSA infection nor to guide or monitor treatment for MRSA infections.      Time coordinating discharge: Over 30 minutes  SIGNED:   Louellen Molder, MD  [facility] 08/24/2017, 12:00 PM Pager   If 7PM-7AM, please contact night-coverage [email or web address] Password [email or web address]

## 2017-08-24 NOTE — Clinical Social Work Note (Signed)
Patient Information   Patient Name Stephen [name], Stephen [name] ([id number]) Sex Male DOB 10-May-1941  SSN: [id number]  Room Bed  IC01 IC01-01  Patient Demographics   Address [place] [address] Phone [phone] [place]) [phone] (Mobile) *Preferred*  Patient Ethnicity & Race   Ethnic Group Patient Race  Not Hispanic or Latino White or Caucasian  Emergency Contact(s)   Name Relation Home Work Mobile  [place] Daughter [phone]  [phone]  Claudean Kinds [phone]  [phone]  Documents on File    Status Date Received Description  Documents for the Patient  EMR Patient Summary Not Received    [place] Not Received    [place] E-Signature HIPAA Notice of Privacy Received 02/19/11   [place] E-Signature HIPAA Notice of Privacy Spanish Not Received    Driver's License Received 81/15/72   Advance Directives/Living Will/HCPOA/POA Not Received    Insurance Card Received 62/03/55   Financial Application Not Received    Insurance Card Not Received    AMB Correspondence Not Received  10/12 Med HX V V S  Insurance Card Not Received    Driver's License Not Received    [facility] Policy for Pain - E Signature Not Received    Insurance Card Received 06/28/12   Insurance Card Not Received    Insurance Card Received 09/27/13   Insurance Card Received 07/04/13   [place] E-Signature HIPAA Notice of Privacy Received 07/04/13   Insurance Card Not Received    Insurance Card Not Received  split billing  Insurance Card Not Received    Insurance Card Not Received    Insurance Card Not Received    AMB Correspondence Not Received  01/15 office note Fagan MD,R  Insurance Card Received 07/12/14 VVS  Insurance Card Received 11/20/14 healthteam  Other Photo ID Not Received    Insurance Card Received 04/16/15 [facility]  Insurance Card Received 09/02/15 HTA/RCGD/at  HIM ROI Authorization  09/04/15 RCGD--09/02/15 prog note to PCP  (fagan)  HIM ROI Authorization  10/21/15 RCGD--10/10/15 TCS op note to PCP (fagan)  Insurance Card Received 11/13/15 [employer] 2017  AMB Correspondence  12/14/15   AMB Correspondence  10/10/15 OP NOTES/ REHMAN M.D. , NEJEEB U.  AMB Correspondence  08/25/16 INSURANCE INFO/ FAGAN M.D. 111 ,R.  AMB Correspondence  07/02/16 CLINICAL NOTES/ FAGAN M.D., R.  Release of Information Received 12/15/16 [facility] DPR  Advanced Beneficiary Notice (ABN) Not Received    E-Signature AOB Spanish Not Received    Insurance Card   [employer] 2018  AMB Correspondence  11/24/16 [facility]  Insurance Card Received 12/14/16 [employer]/GNA  Driver's License Received 97/41/63 NCDL/GNA  [place] E-Signature HIPAA Notice of Privacy Signed 12/14/16   HIM ROI Authorization  02/25/17 [facility]  HIM ROI Authorization   [employer] [facility]  Insurance Card Received 03/11/17 Medicaid Card  Insurance Card Received 04/07/17 new 2018 healthteam  AMB Correspondence (Deleted) 08/11/11 10/12 Med HX VVS  AMB Correspondence (Deleted) 08/25/16 INSURANCE INFO/ FAGAN M.D. 111 ,RAY O.  AMB Correspondence (Deleted) 07/02/16 CLINICAL NOTES/ FAGAN M.D., RAY O.  HIM Release of Information Output  07/30/17 Orders/Results Individual, Scans Individual  Documents for the Encounter  AOB (Assignment of Insurance Benefits) Not Received    E-signature AOB Signed 08/18/17   MEDICARE RIGHTS Not Received    E-signature Medicare Rights Signed 08/18/17   ED Patient Billing Extract   ED PB Summary  Photos     EKG Received 08/19/17   Admission  Information   Attending Provider Admitting Provider Admission Type Admission Date/Time  Dhungel, Flonnie Overman, MD Eber Jones, MD Emergency 08/18/17 [phone]  Discharge Date Hospital Service Auth/Cert Status Service Area   Internal Medicine Incomplete [place]  Unit Room/Bed Admission Status   AP-ICCUP NURSING IC01/IC01-01 Admission (Confirmed)    Admission   Complaint  [facility] Account   Name Acct ID Class Status Primary Coverage  Vian, Fluegel [id number] Inpatient Open [place]      Guarantor Account (for Hospital Account [id number])   Name Relation to Pt Service Area Active? Acct Type  Hinze, [place] Yes Personal/Family  Address Phone    [place],  [address] [phone])        Coverage Information (for Hospital Account [id number])   F/O Payor/Plan Precert #  [facility] ADVANTAGE/[employer] ADVANTAGE   Subscriber Subscriber #  Hiroyuki, [place] [id number]  Address Phone  [place] [address] [phone]

## 2017-08-26 ENCOUNTER — Ambulatory Visit: Payer: PPO | Admitting: Adult Health

## 2017-08-30 ENCOUNTER — Other Ambulatory Visit (HOSPITAL_COMMUNITY): Payer: PPO

## 2017-08-30 ENCOUNTER — Ambulatory Visit (HOSPITAL_COMMUNITY): Payer: PPO

## 2017-09-07 ENCOUNTER — Encounter (HOSPITAL_COMMUNITY): Payer: PPO

## 2017-09-09 ENCOUNTER — Other Ambulatory Visit (HOSPITAL_COMMUNITY): Payer: PPO

## 2017-09-09 ENCOUNTER — Ambulatory Visit (HOSPITAL_COMMUNITY): Payer: PPO

## 2017-09-10 ENCOUNTER — Other Ambulatory Visit: Payer: Self-pay | Admitting: Nurse Practitioner

## 2017-09-20 ENCOUNTER — Other Ambulatory Visit (HOSPITAL_COMMUNITY): Payer: PPO

## 2017-09-20 ENCOUNTER — Ambulatory Visit (HOSPITAL_COMMUNITY): Payer: PPO

## 2017-09-21 DEATH — deceased

## 2017-09-27 ENCOUNTER — Ambulatory Visit: Payer: PPO | Admitting: Nurse Practitioner

## 2018-09-15 IMAGING — CT CT HEAD W/O CM
3 of 4 series · 18 of 47 positions shown, 21 images · non-contrast
Comparison: Prior CT from 09/04/2016.

CLINICAL DATA: Twenty-four follow-up exam status post tPA.

EXAM:
CT HEAD WITHOUT CONTRAST
TECHNIQUE: Contiguous axial images were obtained from the base of the skull
through the vertex without intravenous contrast.

[Series 201: head w/o, idose (1) · axial · non-contrast · 0.41mm/px · z∈[+69,+189]mm · 12 of 30 slices shown, 15 images]
[im 3/30  brain]
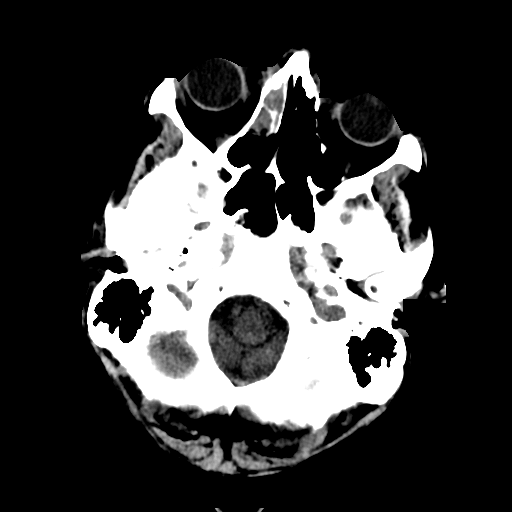
[im 3/30  bone]
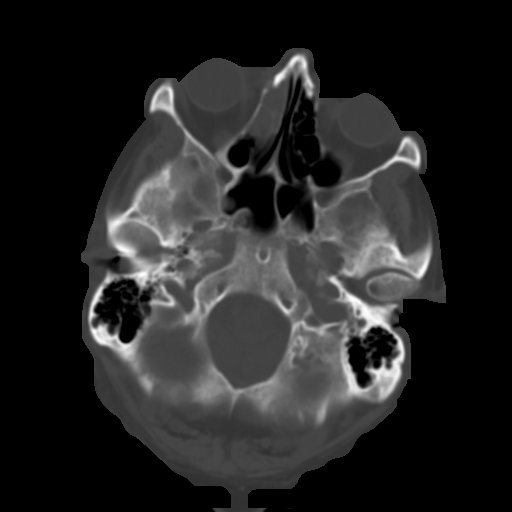
[im 5/30  brain]
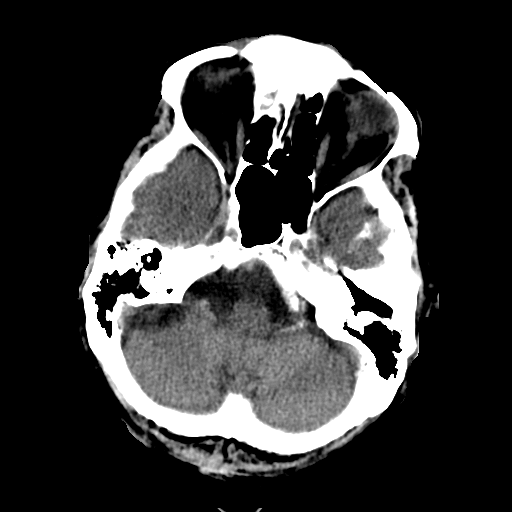
[im 7/30  brain]
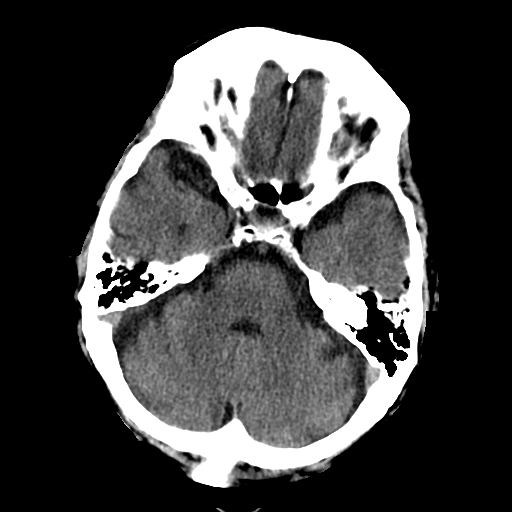
[im 9/30  brain]
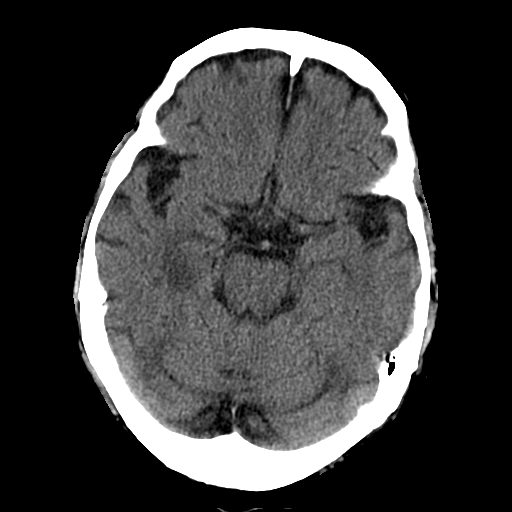
[im 11/30  brain]
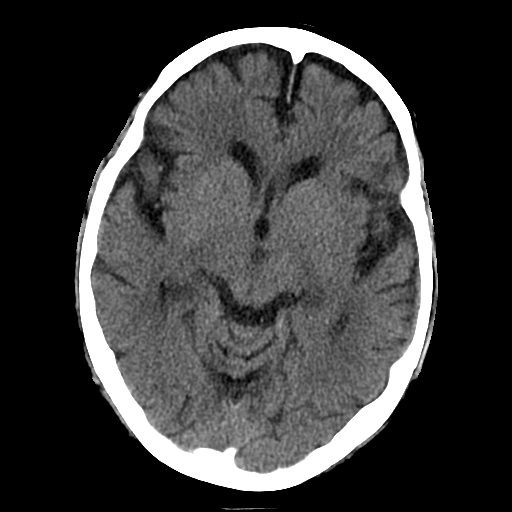
[im 11/30  bone]
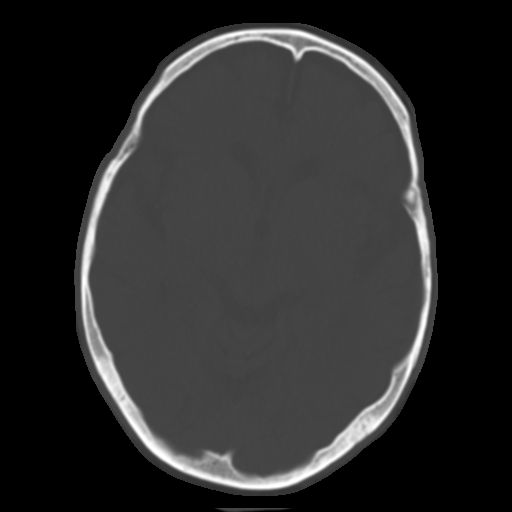
[im 13/30  brain]
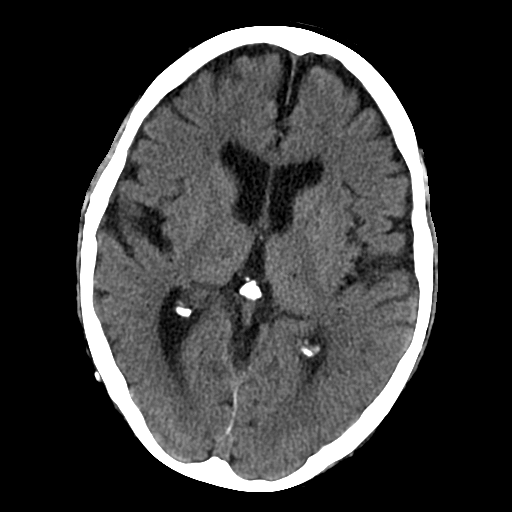
[im 17/30  brain]
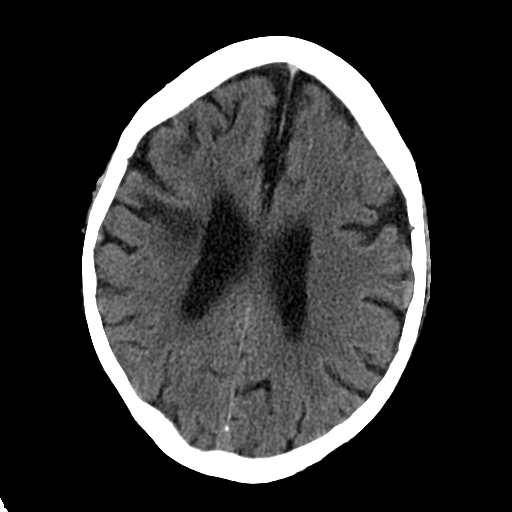
[im 19/30  brain]
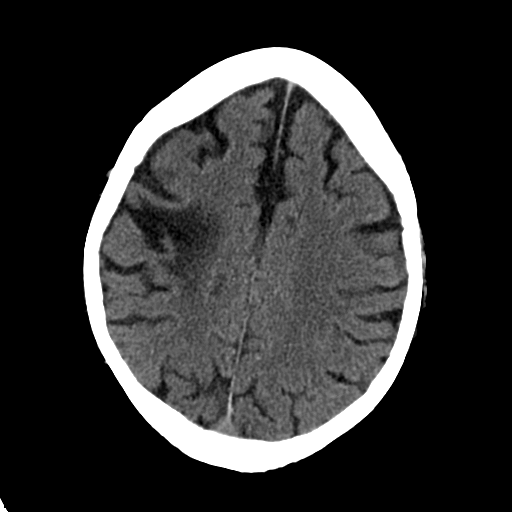
[im 21/30  brain]
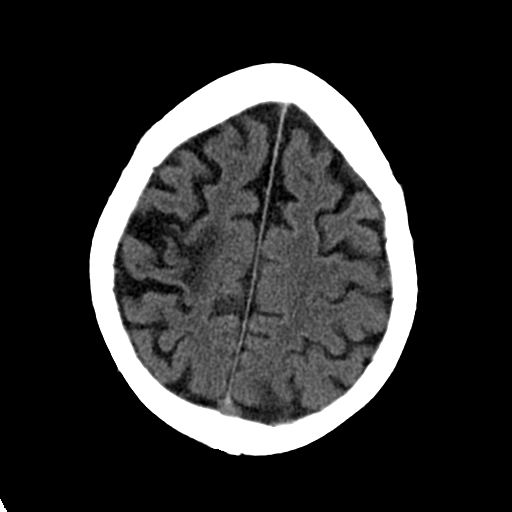
[im 21/30  bone]
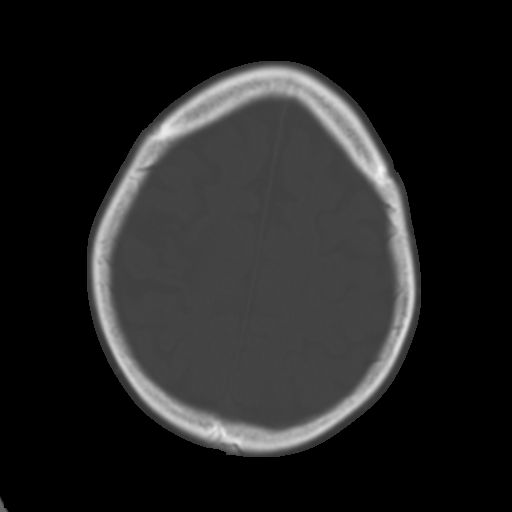
[im 23/30  brain]
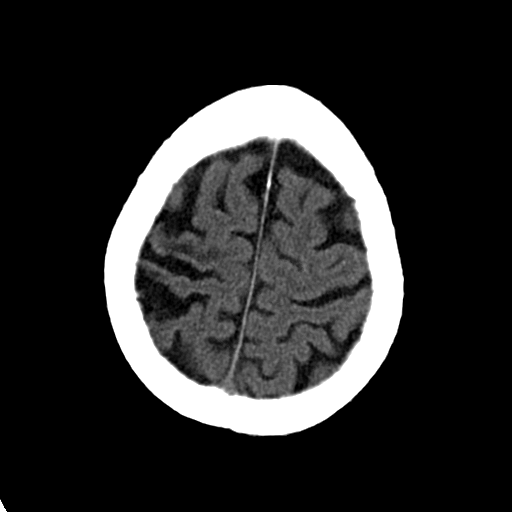
[im 25/30  brain]
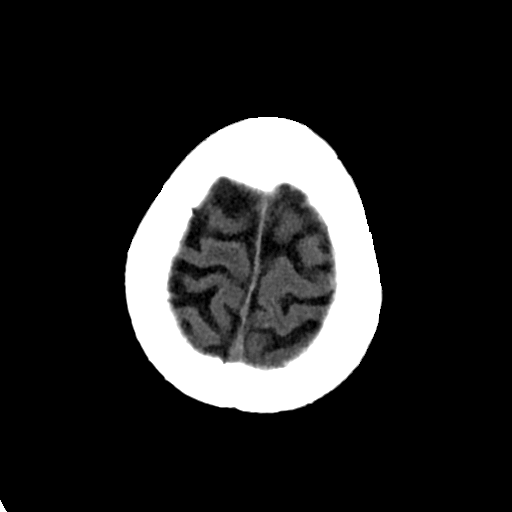
[im 27/30  brain]
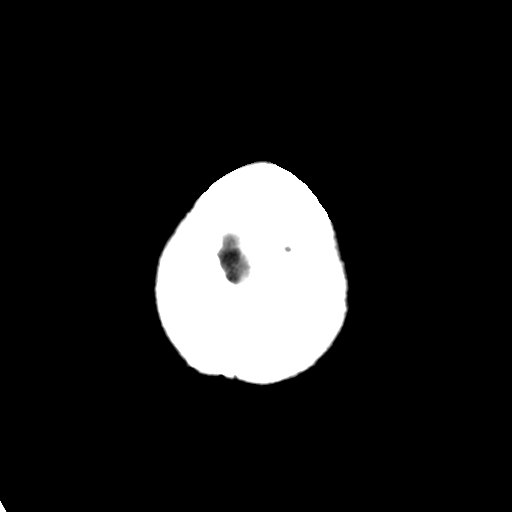

[Series 204: sagittal st, idose (1) · sagittal · 0.40mm/px · 3 of 70 slices shown]
[im 24/70  brain]
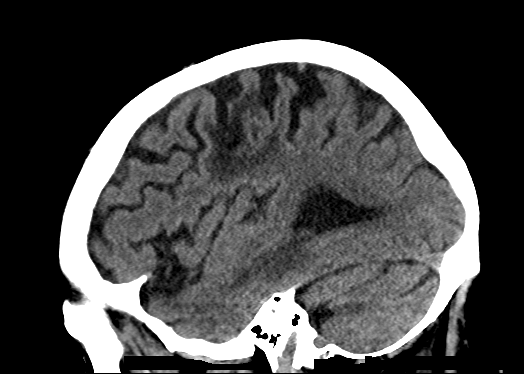
[im 35/70  brain]
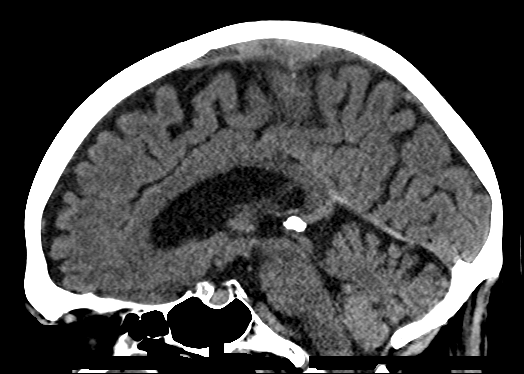
[im 47/70  brain]
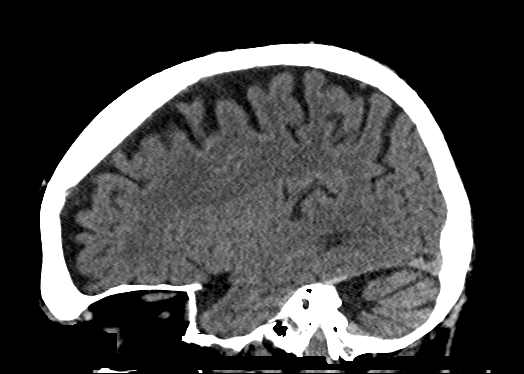

[Series 205: coronal st, idose (1) · coronal · 0.40mm/px · 3 of 67 slices shown]
[im 23/67  brain]
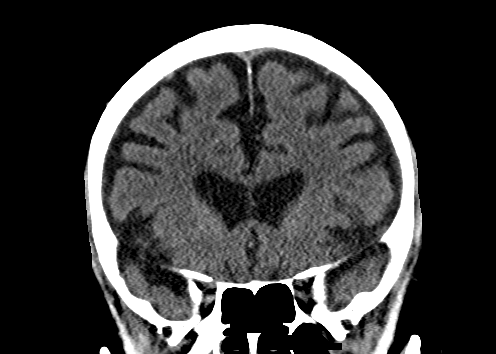
[im 30/67  brain]
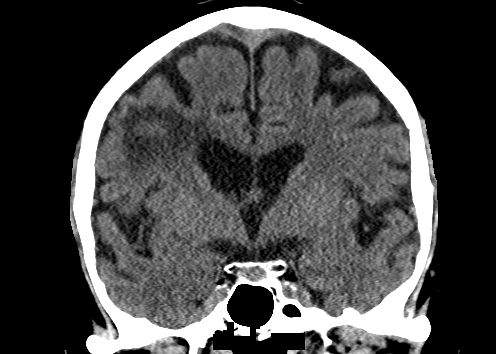
[im 37/67  brain]
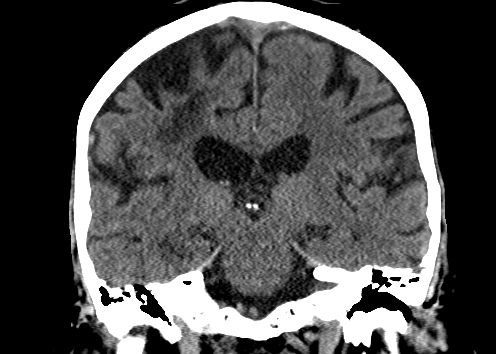

[18 of 47 positions shown; findings below may reference images not displayed]

FINDINGS: Brain: Stable atrophy with chronic microvascular ischemic disease.
Encephalomalacia within the right frontal lobe compatible with
remote right MCA territory infarct, stable. No evidence for acute
intracranial hemorrhage or other complication status post tPA. No
definite evolving large vessel territory infarct identified. No mass
lesion or midline shift. No mass effect. No hydrocephalus. No
extra-axial fluid collection.

Vascular: No hyperdense vessel.  Intracranial atherosclerosis noted.

Skull: Scalp soft tissues within normal limits.  Calvarium intact.

Sinuses/Orbits: The globes and orbital soft tissues within normal
limits. Chronic right maxillary and ethmoidal sinus disease noted.
Paranasal sinuses are otherwise clear. No mastoid effusion.
IMPRESSION: 1. No evidence for acute intracranial hemorrhage or other
complication status post tPA.
2. New hypodensity involving the posterior mesial right temporal
lobe, suspicious for evolving ischemia.
